# Patient Record
Sex: Male | Born: 1935 | Race: White | Hispanic: No | Marital: Married | Smoking: Former smoker
Health system: Southern US, Community
[De-identification: ages and names within clinical notes are randomized; demographics above are authoritative.]

## PROBLEM LIST (undated history)

## (undated) DIAGNOSIS — G459 Transient cerebral ischemic attack, unspecified: Secondary | ICD-10-CM

## (undated) DIAGNOSIS — M1990 Unspecified osteoarthritis, unspecified site: Secondary | ICD-10-CM

## (undated) DIAGNOSIS — I219 Acute myocardial infarction, unspecified: Secondary | ICD-10-CM

## (undated) DIAGNOSIS — K610 Anal abscess: Secondary | ICD-10-CM

## (undated) DIAGNOSIS — I2510 Atherosclerotic heart disease of native coronary artery without angina pectoris: Secondary | ICD-10-CM

## (undated) DIAGNOSIS — I4891 Unspecified atrial fibrillation: Secondary | ICD-10-CM

## (undated) DIAGNOSIS — Z860100 Personal history of colon polyps, unspecified: Secondary | ICD-10-CM

## (undated) DIAGNOSIS — I10 Essential (primary) hypertension: Secondary | ICD-10-CM

## (undated) DIAGNOSIS — I4510 Unspecified right bundle-branch block: Secondary | ICD-10-CM

## (undated) DIAGNOSIS — E785 Hyperlipidemia, unspecified: Secondary | ICD-10-CM

## (undated) DIAGNOSIS — I4819 Other persistent atrial fibrillation: Secondary | ICD-10-CM

## (undated) HISTORY — DX: Transient cerebral ischemic attack, unspecified: G45.9

## (undated) HISTORY — DX: Unspecified atrial fibrillation: I48.91

## (undated) HISTORY — PX: CORONARY ANGIOPLASTY WITH STENT PLACEMENT: SHX49

## (undated) HISTORY — PX: CATARACT EXTRACTION W/ INTRAOCULAR LENS  IMPLANT, BILATERAL: SHX1307

## (undated) HISTORY — DX: Personal history of colonic polyps: Z86.010

## (undated) HISTORY — DX: Personal history of colon polyps, unspecified: Z86.0100

## (undated) HISTORY — DX: Essential (primary) hypertension: I10

## (undated) HISTORY — PX: CARDIAC CATHETERIZATION: SHX172

## (undated) HISTORY — DX: Unspecified right bundle-branch block: I45.10

## (undated) HISTORY — DX: Unspecified osteoarthritis, unspecified site: M19.90

## (undated) HISTORY — DX: Other persistent atrial fibrillation: I48.19

## (undated) HISTORY — DX: Hyperlipidemia, unspecified: E78.5

## (undated) HISTORY — PX: RETINAL DETACHMENT SURGERY: SHX105

## (undated) HISTORY — DX: Anal abscess: K61.0

## (undated) HISTORY — DX: Atherosclerotic heart disease of native coronary artery without angina pectoris: I25.10

---

## 1998-04-25 ENCOUNTER — Ambulatory Visit (HOSPITAL_COMMUNITY): Admission: RE | Admit: 1998-04-25 | Discharge: 1998-04-25 | Payer: Self-pay | Admitting: Gastroenterology

## 1999-04-05 ENCOUNTER — Encounter: Payer: Self-pay | Admitting: *Deleted

## 1999-04-05 ENCOUNTER — Encounter: Admission: RE | Admit: 1999-04-05 | Discharge: 1999-04-05 | Payer: Self-pay | Admitting: *Deleted

## 1999-08-10 DIAGNOSIS — I219 Acute myocardial infarction, unspecified: Secondary | ICD-10-CM

## 1999-08-10 HISTORY — DX: Acute myocardial infarction, unspecified: I21.9

## 1999-09-03 ENCOUNTER — Encounter: Payer: Self-pay | Admitting: Emergency Medicine

## 1999-09-03 ENCOUNTER — Inpatient Hospital Stay (HOSPITAL_COMMUNITY): Admission: EM | Admit: 1999-09-03 | Discharge: 1999-09-06 | Payer: Self-pay | Admitting: Emergency Medicine

## 1999-09-12 ENCOUNTER — Encounter (HOSPITAL_COMMUNITY): Admission: RE | Admit: 1999-09-12 | Discharge: 1999-12-11 | Payer: Self-pay | Admitting: *Deleted

## 2000-04-30 ENCOUNTER — Ambulatory Visit (HOSPITAL_COMMUNITY): Admission: RE | Admit: 2000-04-30 | Discharge: 2000-04-30 | Payer: Self-pay | Admitting: *Deleted

## 2001-04-25 ENCOUNTER — Encounter (INDEPENDENT_AMBULATORY_CARE_PROVIDER_SITE_OTHER): Payer: Self-pay | Admitting: Specialist

## 2001-04-25 ENCOUNTER — Ambulatory Visit (HOSPITAL_COMMUNITY): Admission: RE | Admit: 2001-04-25 | Discharge: 2001-04-25 | Payer: Self-pay | Admitting: Gastroenterology

## 2003-06-12 HISTORY — PX: INGUINAL HERNIA REPAIR: SUR1180

## 2004-09-28 ENCOUNTER — Ambulatory Visit: Payer: Self-pay | Admitting: Internal Medicine

## 2004-10-04 ENCOUNTER — Ambulatory Visit: Payer: Self-pay | Admitting: *Deleted

## 2004-10-23 ENCOUNTER — Ambulatory Visit: Payer: Self-pay | Admitting: Internal Medicine

## 2004-10-26 ENCOUNTER — Ambulatory Visit: Payer: Self-pay

## 2004-12-01 ENCOUNTER — Ambulatory Visit: Payer: Self-pay | Admitting: Internal Medicine

## 2005-02-23 ENCOUNTER — Ambulatory Visit: Payer: Self-pay | Admitting: Internal Medicine

## 2005-07-13 ENCOUNTER — Ambulatory Visit: Payer: Self-pay | Admitting: Cardiology

## 2005-10-22 ENCOUNTER — Ambulatory Visit: Payer: Self-pay | Admitting: Cardiology

## 2006-05-07 ENCOUNTER — Ambulatory Visit: Payer: Self-pay | Admitting: Cardiology

## 2006-05-07 LAB — CONVERTED CEMR LAB
ALT: 23 units/L (ref 0–40)
AST: 22 units/L (ref 0–37)
Albumin: 3.7 g/dL (ref 3.5–5.2)
Alkaline Phosphatase: 64 units/L (ref 39–117)
BUN: 11 mg/dL (ref 6–23)
Basophils Absolute: 0 10*3/uL (ref 0.0–0.1)
Basophils Relative: 0.8 % (ref 0.0–1.0)
Bilirubin Urine: NEGATIVE
CO2: 28 meq/L (ref 19–32)
Calcium: 9.4 mg/dL (ref 8.4–10.5)
Chloride: 107 meq/L (ref 96–112)
Chol/HDL Ratio, serum: 2.9
Cholesterol: 118 mg/dL (ref 0–200)
Creatinine, Ser: 1 mg/dL (ref 0.4–1.5)
Eosinophil percent: 3.4 % (ref 0.0–5.0)
GFR calc non Af Amer: 79 mL/min
Glomerular Filtration Rate, Af Am: 95 mL/min/{1.73_m2}
Glucose, Bld: 100 mg/dL — ABNORMAL HIGH (ref 70–99)
HCT: 42.6 % (ref 39.0–52.0)
HDL: 40.5 mg/dL (ref >39.0)
Hemoglobin, Urine: NEGATIVE
Hemoglobin: 14.3 g/dL (ref 13.0–17.0)
Hgb A1c MFr Bld: 5.8 % (ref 4.6–6.0)
Ketones, ur: NEGATIVE mg/dL
LDL Cholesterol: 62 mg/dL (ref 0–99)
Leukocytes, UA: NEGATIVE
Lymphocytes Relative: 29.3 % (ref 12.0–46.0)
MCHC: 33.5 g/dL (ref 30.0–36.0)
MCV: 93.6 fL (ref 78.0–100.0)
Monocytes Absolute: 0.5 10*3/uL (ref 0.2–0.7)
Monocytes Relative: 9.4 % (ref 3.0–11.0)
Neutro Abs: 2.7 10*3/uL (ref 1.4–7.7)
Neutrophils Relative %: 57.1 % (ref 43.0–77.0)
Nitrite: NEGATIVE
PSA: 0.78 ng/mL (ref 0.10–4.00)
Platelets: 255 10*3/uL (ref 150–400)
Potassium: 4.4 meq/L (ref 3.5–5.1)
RBC: 4.55 M/uL (ref 4.22–5.81)
RDW: 12.6 % (ref 11.5–14.6)
Sodium: 141 meq/L (ref 135–145)
Specific Gravity, Urine: 1.015 (ref 1.000–1.03)
TSH: 1.18 microintl units/mL (ref 0.35–5.50)
Total Bilirubin: 0.5 mg/dL (ref 0.3–1.2)
Total Protein, Urine: NEGATIVE mg/dL
Total Protein: 6.1 g/dL (ref 6.0–8.3)
Triglyceride fasting, serum: 80 mg/dL (ref 0–149)
Urine Glucose: NEGATIVE mg/dL
Urobilinogen, UA: 0.2 (ref 0.0–1.0)
VLDL: 16 mg/dL (ref 0–40)
WBC: 4.8 10*3/uL (ref 4.5–10.5)
pH: 6 (ref 5.0–8.0)

## 2006-06-28 ENCOUNTER — Ambulatory Visit: Payer: Self-pay | Admitting: Internal Medicine

## 2006-08-09 ENCOUNTER — Ambulatory Visit: Payer: Self-pay | Admitting: Cardiology

## 2007-05-23 ENCOUNTER — Ambulatory Visit: Payer: Self-pay | Admitting: Cardiology

## 2007-05-23 LAB — CONVERTED CEMR LAB
ALT: 33 units/L (ref 0–53)
AST: 21 units/L (ref 0–37)
Albumin: 3.7 g/dL (ref 3.5–5.2)
Alkaline Phosphatase: 59 units/L (ref 39–117)
Bilirubin, Direct: 0.2 mg/dL (ref 0.0–0.3)
Cholesterol: 155 mg/dL (ref 0–200)
HDL: 47.4 mg/dL (ref >39.0)
LDL Cholesterol: 90 mg/dL (ref 0–99)
Total Bilirubin: 0.9 mg/dL (ref 0.3–1.2)
Total CHOL/HDL Ratio: 3.3
Total Protein: 6.5 g/dL (ref 6.0–8.3)
Triglycerides: 87 mg/dL (ref 0–149)
VLDL: 17 mg/dL (ref 0–40)

## 2007-05-27 ENCOUNTER — Encounter: Admission: RE | Admit: 2007-05-27 | Discharge: 2007-05-27 | Payer: Self-pay | Admitting: General Surgery

## 2007-05-27 ENCOUNTER — Ambulatory Visit: Payer: Self-pay | Admitting: Cardiology

## 2007-05-29 ENCOUNTER — Ambulatory Visit (HOSPITAL_BASED_OUTPATIENT_CLINIC_OR_DEPARTMENT_OTHER): Admission: RE | Admit: 2007-05-29 | Discharge: 2007-05-29 | Payer: Self-pay | Admitting: General Surgery

## 2007-07-28 ENCOUNTER — Telehealth (INDEPENDENT_AMBULATORY_CARE_PROVIDER_SITE_OTHER): Payer: Self-pay | Admitting: *Deleted

## 2007-09-01 ENCOUNTER — Telehealth (INDEPENDENT_AMBULATORY_CARE_PROVIDER_SITE_OTHER): Payer: Self-pay | Admitting: *Deleted

## 2007-09-12 ENCOUNTER — Encounter (INDEPENDENT_AMBULATORY_CARE_PROVIDER_SITE_OTHER): Payer: Self-pay | Admitting: *Deleted

## 2007-09-22 ENCOUNTER — Telehealth: Payer: Self-pay | Admitting: Internal Medicine

## 2007-10-20 ENCOUNTER — Ambulatory Visit: Payer: Self-pay | Admitting: Internal Medicine

## 2007-10-20 LAB — CONVERTED CEMR LAB
ALT: 26 units/L (ref 0–53)
AST: 23 units/L (ref 0–37)
Albumin: 4 g/dL (ref 3.5–5.2)
Alkaline Phosphatase: 64 units/L (ref 39–117)
BUN: 25 mg/dL — ABNORMAL HIGH (ref 6–23)
Basophils Absolute: 0.1 10*3/uL (ref 0.0–0.1)
Basophils Relative: 1.7 % — ABNORMAL HIGH (ref 0.0–1.0)
Bilirubin Urine: NEGATIVE
Bilirubin, Direct: 0.1 mg/dL (ref 0.0–0.3)
CO2: 26 meq/L (ref 19–32)
Calcium: 9.3 mg/dL (ref 8.4–10.5)
Chloride: 108 meq/L (ref 96–112)
Cholesterol: 153 mg/dL (ref 0–200)
Creatinine, Ser: 1 mg/dL (ref 0.4–1.5)
Eosinophils Absolute: 0.1 10*3/uL (ref 0.0–0.7)
Eosinophils Relative: 2.2 % (ref 0.0–5.0)
GFR calc Af Amer: 95 mL/min
GFR calc non Af Amer: 79 mL/min
Glucose, Bld: 98 mg/dL (ref 70–99)
HCT: 45.7 % (ref 39.0–52.0)
HDL: 57 mg/dL (ref >39.0)
Hemoglobin, Urine: NEGATIVE
Hemoglobin: 15.1 g/dL (ref 13.0–17.0)
Ketones, ur: NEGATIVE mg/dL
LDL Cholesterol: 87 mg/dL (ref 0–99)
Leukocytes, UA: NEGATIVE
Lymphocytes Relative: 35.4 % (ref 12.0–46.0)
MCHC: 33.2 g/dL (ref 30.0–36.0)
MCV: 92.2 fL (ref 78.0–100.0)
Monocytes Absolute: 0.4 10*3/uL (ref 0.1–1.0)
Monocytes Relative: 10.4 % (ref 3.0–12.0)
Neutro Abs: 2.2 10*3/uL (ref 1.4–7.7)
Neutrophils Relative %: 50.3 % (ref 43.0–77.0)
Nitrite: NEGATIVE
PSA: 0.71 ng/mL (ref 0.10–4.00)
Platelets: 261 10*3/uL (ref 150–400)
Potassium: 4.1 meq/L (ref 3.5–5.1)
RBC: 4.95 M/uL (ref 4.22–5.81)
RDW: 13 % (ref 11.5–14.6)
Sodium: 138 meq/L (ref 135–145)
Specific Gravity, Urine: <=1.005 (ref 1.000–1.03)
TSH: 1.68 microintl units/mL (ref 0.35–5.50)
Testosterone: 358.35 ng/dL (ref 350.00–890)
Total Bilirubin: 0.8 mg/dL (ref 0.3–1.2)
Total CHOL/HDL Ratio: 2.7
Total Protein, Urine: NEGATIVE mg/dL
Total Protein: 6.8 g/dL (ref 6.0–8.3)
Triglycerides: 44 mg/dL (ref 0–149)
Urine Glucose: NEGATIVE mg/dL
Urobilinogen, UA: 0.2 (ref 0.0–1.0)
VLDL: 9 mg/dL (ref 0–40)
WBC: 4.3 10*3/uL — ABNORMAL LOW (ref 4.5–10.5)
pH: 5.5 (ref 5.0–8.0)

## 2007-10-27 ENCOUNTER — Ambulatory Visit: Payer: Self-pay | Admitting: Internal Medicine

## 2007-10-27 DIAGNOSIS — Z860100 Personal history of colon polyps, unspecified: Secondary | ICD-10-CM | POA: Insufficient documentation

## 2007-10-27 DIAGNOSIS — E785 Hyperlipidemia, unspecified: Secondary | ICD-10-CM | POA: Insufficient documentation

## 2007-10-27 DIAGNOSIS — I2510 Atherosclerotic heart disease of native coronary artery without angina pectoris: Secondary | ICD-10-CM | POA: Insufficient documentation

## 2007-10-27 LAB — CONVERTED CEMR LAB
Cholesterol, target level: 200 mg/dL
HDL goal, serum: 40 mg/dL
LDL Goal: 160 mg/dL

## 2007-12-08 ENCOUNTER — Encounter: Payer: Self-pay | Admitting: Internal Medicine

## 2007-12-08 ENCOUNTER — Ambulatory Visit: Payer: Self-pay

## 2007-12-08 ENCOUNTER — Ambulatory Visit: Payer: Self-pay | Admitting: Cardiology

## 2008-05-03 ENCOUNTER — Ambulatory Visit: Payer: Self-pay | Admitting: Cardiology

## 2008-05-03 LAB — CONVERTED CEMR LAB
ALT: 25 units/L (ref 0–53)
AST: 18 units/L (ref 0–37)
Albumin: 3.6 g/dL (ref 3.5–5.2)
Alkaline Phosphatase: 51 units/L (ref 39–117)
Bilirubin, Direct: 0.1 mg/dL (ref 0.0–0.3)
Cholesterol: 146 mg/dL (ref 0–200)
HDL: 60.2 mg/dL (ref >39.0)
LDL Cholesterol: 78 mg/dL (ref 0–99)
Total Bilirubin: 0.7 mg/dL (ref 0.3–1.2)
Total CHOL/HDL Ratio: 2.4
Total Protein: 6.6 g/dL (ref 6.0–8.3)
Triglycerides: 39 mg/dL (ref 0–149)
VLDL: 8 mg/dL (ref 0–40)

## 2008-05-11 ENCOUNTER — Ambulatory Visit: Payer: Self-pay | Admitting: Cardiovascular Disease

## 2008-05-25 ENCOUNTER — Ambulatory Visit: Payer: Self-pay

## 2008-12-02 ENCOUNTER — Telehealth (INDEPENDENT_AMBULATORY_CARE_PROVIDER_SITE_OTHER): Payer: Self-pay | Admitting: *Deleted

## 2009-01-31 ENCOUNTER — Telehealth (INDEPENDENT_AMBULATORY_CARE_PROVIDER_SITE_OTHER): Payer: Self-pay | Admitting: *Deleted

## 2009-09-01 ENCOUNTER — Encounter (INDEPENDENT_AMBULATORY_CARE_PROVIDER_SITE_OTHER): Payer: Self-pay | Admitting: *Deleted

## 2009-09-16 ENCOUNTER — Telehealth: Payer: Self-pay | Admitting: Cardiovascular Disease

## 2009-09-21 ENCOUNTER — Ambulatory Visit: Payer: Self-pay | Admitting: Cardiovascular Disease

## 2009-09-27 ENCOUNTER — Ambulatory Visit: Payer: Self-pay | Admitting: Cardiovascular Disease

## 2009-09-27 LAB — CONVERTED CEMR LAB
ALT: 25 units/L (ref 0–53)
AST: 18 units/L (ref 0–37)
Albumin: 3.8 g/dL (ref 3.5–5.2)
Alkaline Phosphatase: 64 units/L (ref 39–117)
BUN: 14 mg/dL (ref 6–23)
Basophils Absolute: 0 10*3/uL (ref 0.0–0.1)
Basophils Relative: 0.3 % (ref 0.0–3.0)
Bilirubin Urine: NEGATIVE
Bilirubin, Direct: 0.1 mg/dL (ref 0.0–0.3)
CO2: 29 meq/L (ref 19–32)
Calcium: 9.6 mg/dL (ref 8.4–10.5)
Chloride: 103 meq/L (ref 96–112)
Cholesterol: 158 mg/dL (ref 0–200)
Creatinine, Ser: 0.8 mg/dL (ref 0.4–1.5)
Eosinophils Absolute: 0.1 10*3/uL (ref 0.0–0.7)
Eosinophils Relative: 1.1 % (ref 0.0–5.0)
GFR calc non Af Amer: 100.82 mL/min (ref >60)
Glucose, Bld: 103 mg/dL — ABNORMAL HIGH (ref 70–99)
HCT: 42.1 % (ref 39.0–52.0)
HDL: 61.1 mg/dL (ref >39.00)
Hemoglobin, Urine: NEGATIVE
Hemoglobin: 14.5 g/dL (ref 13.0–17.0)
Ketones, ur: NEGATIVE mg/dL
LDL Cholesterol: 84 mg/dL (ref 0–99)
Leukocytes, UA: NEGATIVE
Lymphocytes Relative: 17 % (ref 12.0–46.0)
Lymphs Abs: 1.6 10*3/uL (ref 0.7–4.0)
MCHC: 34.4 g/dL (ref 30.0–36.0)
MCV: 91.3 fL (ref 78.0–100.0)
Monocytes Absolute: 0.7 10*3/uL (ref 0.1–1.0)
Monocytes Relative: 7 % (ref 3.0–12.0)
Neutro Abs: 7 10*3/uL (ref 1.4–7.7)
Neutrophils Relative %: 74.6 % (ref 43.0–77.0)
Nitrite: NEGATIVE
PSA: 1.34 ng/mL (ref 0.10–4.00)
Platelets: 296 10*3/uL (ref 150.0–400.0)
Potassium: 4.6 meq/L (ref 3.5–5.1)
RBC: 4.61 M/uL (ref 4.22–5.81)
RDW: 13.5 % (ref 11.5–14.6)
Sodium: 140 meq/L (ref 135–145)
Specific Gravity, Urine: 1.02 (ref 1.000–1.030)
TSH: 1.58 microintl units/mL (ref 0.35–5.50)
Testosterone: 352.65 ng/dL (ref 350.00–890.00)
Total Bilirubin: 0.4 mg/dL (ref 0.3–1.2)
Total CHOL/HDL Ratio: 3
Total Protein, Urine: NEGATIVE mg/dL
Total Protein: 7.6 g/dL (ref 6.0–8.3)
Triglycerides: 67 mg/dL (ref 0.0–149.0)
Urine Glucose: NEGATIVE mg/dL
Urobilinogen, UA: 0.2 (ref 0.0–1.0)
VLDL: 13.4 mg/dL (ref 0.0–40.0)
WBC: 9.4 10*3/uL (ref 4.5–10.5)
pH: 6 (ref 5.0–8.0)

## 2009-10-17 ENCOUNTER — Ambulatory Visit: Payer: Self-pay | Admitting: Internal Medicine

## 2009-10-26 ENCOUNTER — Telehealth (INDEPENDENT_AMBULATORY_CARE_PROVIDER_SITE_OTHER): Payer: Self-pay | Admitting: *Deleted

## 2009-12-28 ENCOUNTER — Ambulatory Visit: Payer: Self-pay | Admitting: Family Medicine

## 2010-06-06 ENCOUNTER — Encounter: Payer: Self-pay | Admitting: Cardiovascular Disease

## 2010-06-07 ENCOUNTER — Ambulatory Visit: Payer: Self-pay

## 2010-06-07 ENCOUNTER — Encounter: Payer: Self-pay | Admitting: Cardiovascular Disease

## 2010-07-11 NOTE — Letter (Signed)
Summary: Primary Care Appointment Letter  [facility]  [address] [place], [place] [address]   Phone: [phone]  Fax: [phone]    09/01/2009 MRN: [id number]  Luis [name] [address] [place], [place]  [address]  Dear Mr. BURKITT,   Your Primary Care Physician Marga Melnick MD has indicated that:    ___x____it is time to schedule an appointment LAST OFFICE VISIT WAS 10/27/07 AND LAST REFILL WAS 01/31/09 OFFICE VISIT AND LABS ARE NEEDED TO CONTINUE FILLING MEDICATION.    _______you missed your appointment on______ and need to call and          reschedule.    _______you need to have lab work done.    _______you need to schedule an appointment discuss lab or test results.    _______you need to call to reschedule your appointment that is                       scheduled on _________.     Please call our office as soon as possible. Our phone number is [phone]_____. Our office is open 8a-5p, Monday through Friday.     Thank you,    [facility]

## 2010-07-11 NOTE — Progress Notes (Signed)
Summary: Pt calling about labs drawn   Phone Note Call from Patient Call back at Home Phone [phone] Call back at [phone]    Caller: Patient Summary of Call: Pt want a call about having labs drawn Initial call taken by: Judie Grieve,  September 16, 2009 11:47 AM  Follow-up for Phone Call        Pt. has office visit with Dr. Clifton James on 09/27/09 and appt with Dr. Alwyn Ren on 5/9. He would like lab work drawn prior to visit with Dr. Clifton James so it is available by time of office visit. Lipid and liver usually  checked in our office. These labs ordered. Labs order at [facility] office for next week per pt's request.  He will contact Dr. Alwyn Ren regarding other lab work to be drawn at that time. Follow-up by: Dossie Arbour, RN, BSN,  September 16, 2009 3:20 PM

## 2010-07-11 NOTE — Progress Notes (Signed)
  Request recieved from [facility] sent to [facility] [place]  Oct 26, 2009 10:12 AM

## 2010-07-11 NOTE — Assessment & Plan Note (Signed)
Summary: f2y   Visit Type:  2 yr f/u Primary Provider:  Marga Melnick MD  CC:  no cardiac complaints today.  History of Present Illness: Luis [name] is a pleasant 76 year old white male with a past medical history significant for coronary artery disease status post placement of a bare-metal stent in the right coronary artery in 2001 with a staged intervention to the circumflex coronary artery with placement of a bare-metal stent in the mid circumflex vessel and balloon angioplasty of the ostium of the first obtuse marginal coronary artery.  He has a residual 70% stenosis in the distal right coronary artery as well as nonobstructive disease in the other vessels that has been managed medically over the last several years. He comes in today for follow up.  He tells me that he continues to work full-time and exercises several days per week.  He has experienced no exertional chest pain or dyspnea with exertion.  He denies palpitations, any awareness of irregularity of his heart rhythm, orthopnea, PND, or lower extremity edema.  He has some tingling in his feet which has been stable.   Current Medications (verified): 1)  Zetia 10 Mg Tabs (Ezetimibe) .... Take 1 Tablet By Mouth Every Night 2)  Lipitor 80 Mg Tabs (Atorvastatin Calcium) .... Take 1 Tablet By Mouth Once A Day 3)  Atenolol 25 Mg Tabs (Atenolol) .Marland Kitchen.. 1 Tab Once Daily 4)  Nitrostat 0.4 Mg Subl (Nitroglycerin) .Marland Kitchen.. 1 Tablet Under Tongue At Onset of Chest Pain; You May Repeat Every 5 Minutes For Up To 3 Doses. 5)  Folic Acid 1 Mg  Tabs (Folic Acid) .Marland Kitchen.. 1 Qd 6)  Aspirin 81 Mg Tbec (Aspirin) .... Take One Tablet By Mouth Daily  Allergies (verified): No Known Drug Allergies  Past History:  Past Medical History: Hyperlipidemia Ttinnitis Neuropathy , stable Coronary artery disease-s/p bare metal stent RCA and bare metal stent Circumflex 2001, PTCA OM1 2001. Retinal detachment HTN         Past Surgical History: CAD two stents  08/1999 cath 2001: stents  patent 05/2000 colonoscopy 2002,2005, Dr Kinnie Scales cataract surgery bilat 2007 laser surgery after lens implant OS Inguinal hernia repair 12/08, Dr Zachery Dakins Retinal surgery left eye  Family History: Reviewed history from 05/11/2008 and no changes required. Mother-deceased, ? cause Sister-deceased  cva father colon cancer, prostate cancer No CAD No SCD  Social History: Reviewed history from 05/11/2008 and no changes required. 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    Vital Signs:  Patient profile:   76 year old male Height:      72 inches Weight:      208 pounds BMI:     28.31 Pulse rate:   79 / minute Pulse rhythm:   regular BP sitting:   116 / 70  (left arm) Cuff size:   regular  Vitals Entered By: Danielle Rankin, CMA (September 27, 2009 3:35 PM)  Physical Exam  General:  General: Well developed, well  nourished, NAD HEENT: OP clear, mucus membranes moist SKIN: warm, dry Neuro: No focal deficits Musculoskeletal: Muscle strength 5/5 all ext Psychiatric: Mood and affect normal Neck: No JVD, no carotid bruits, no thyromegaly, no lymphadenopathy. Lungs:Clear bilaterally, no wheezes, rhonci, crackles CV: RRR no murmurs, gallops rubs Abdomen: soft, NT, ND, BS present Extremities: No edema, pulses  2+.    EKG  Procedure date:  09/27/2009  Findings:      NSR, rate 79 bpm. Normal EKG   Impression & Recommendations:  Problem # 1:  CORONARY ARTERY DISEASE (ICD-414.00) Stable. Continue beta blocker, ASA and statin.   The following medications were removed from the medication list:    Altace 5 Mg Caps (Ramipril) .Marland Kitchen... Take 1 capsule by mouth once a day His updated medication list for this problem includes:    Atenolol 25 Mg Tabs (Atenolol) .Marland Kitchen... 1 tab once daily    Nitrostat 0.4 Mg Subl (Nitroglycerin) .Marland Kitchen... 1 tablet under tongue at onset of chest pain; you may repeat every 5 minutes for up to 3 doses.    Aspirin 81 Mg Tbec (Aspirin) .Marland Kitchen... Take one tablet by mouth daily  Orders: EKG w/ Interpretation (93000)  Problem # 2:  UNSPECIFIED ESSENTIAL HYPERTENSION (ICD-401.9) BP is well controlled.   The following medications were removed from the medication list:    Altace 5 Mg Caps (Ramipril) .Marland Kitchen... Take 1 capsule by mouth once a day His updated medication list for this problem includes:    Atenolol 25 Mg Tabs (Atenolol) .Marland Kitchen... 1 tab once daily    Aspirin 81 Mg Tbec (Aspirin) .Marland Kitchen... Take one tablet by mouth daily  Problem # 3:  HYPERLIPIDEMIA (ICD-272.4) LDL of 84, HDL 66, TC 154. Continue Zetia and Lipitor.   His updated medication list for this problem includes:    Zetia 10 Mg Tabs (Ezetimibe) .Marland Kitchen... Take 1 tablet by mouth every night    Lipitor 80 Mg Tabs (Atorvastatin calcium) .Marland Kitchen... Take 1 tablet by mouth once a day  Patient Instructions: 1)  Your physician recommends that you schedule a follow-up appointment in: 1 year 2)  Your physician has recommended you make the following change in your medication: Start enteric coated aspirin 81 mg by mouth daily Prescriptions: ATENOLOL 25 MG TABS (ATENOLOL) 1 tab once daily  #30 x 11   Entered by:   Danielle Rankin, CMA   Authorized by:   Verne Carrow, MD   Signed by:   Danielle Rankin, CMA on 09/27/2009   Method used:    Electronically to        [facility]* (retail)       [address]       [place], [place]  [id number]       Ph: [phone]       Fax: [phone]   RxID:   [id number]

## 2010-07-11 NOTE — Assessment & Plan Note (Signed)
Summary: cpx//lch   Vital Signs:  Patient profile:   75 year old male Height:      71 inches Weight:      210 pounds Temp:     98.8 degrees F oral Pulse rate:   78 / minute Resp:     22 per minute BP sitting:   138 / 82  (left arm)  Vitals Entered By: Jeremy Johann CMA (Oct 17, 2009 1:19 PM)  Comments Normal EKG done 09-27-09 REVIEWED MED LIST, PATIENT AGREED DOSE AND INSTRUCTION CORRECT    Primary Care Provider:  Marga Melnick MD   History of Present Illness: Luis [name] is here for a physical; he has had a dramatic loss in OS vision due to retinal detachment X 2 which required 3 surgeries. OS vision  is now 20/2800. Preventive healthcare issues reviewed  ; all up to date except Tetanus shot ( given today). Dr [facility]  09/27/2009 note reviewed.  Allergies (verified): No Known Drug Allergies  Past History:  Past Medical History: Hyperlipidemia Tinnitis Neuropathy , stable Coronary artery disease-S/P  bare metal stent RCA and bare metal stent Circumflex 2001, PTCA OM1 2001. Retinal detachment X 2  ,surgery X 3 over period 05/2008 - 09/2008  HTN         Past Surgical History: CAD, two stents 08/1999 cath 2001: stents  patent 05/2000 colonoscopy 2002,2005, Dr Ritta Slot, due now cataract surgery bilaterally  2007 laser surgery after lens implant OS Inguinal hernia repair 05/2007, Dr Zachery Dakins Retinal surgery X 3  OS  Family History: Mother-deceased, ? 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         Stable hearing loss. Eyes:  Complains of vision loss-1 eye; denies double vision. GU:  Denies discharge, dysuria, hematuria, nocturia, and urinary hesitancy. Neuro:  Complains of numbness and tingling; denies brief paralysis, headaches, tremors, and weakness; N&T in feet stable.  Physical Exam  General:  Appears younger than agewell-nourished; alert,appropriate and cooperative throughout examination Head:  Normocephalic and atraumatic without obvious abnormalities. No apparent alopecia ; beard & moustache Eyes:  Distorted , asymmetric OS pupil. OD exam  normal Ears:  External ear exam shows no significant lesions or deformities.  Otoscopic examination reveals clear canals, tympanic membranes are intact bilaterally without bulging, retraction, inflammation or discharge. Hearing is grossly normal bilaterally. Nose:  External nasal examination shows no deformity or inflammation. Nasal mucosa are pink and moist without lesions or exudates. Mouth:  Oral mucosa and oropharynx without lesions or exudates.  Teeth in good repair, upper & lower partial. Neck:  No deformities, masses, or tenderness noted. Lungs:  Normal respiratory effort, chest expands symmetrically. Lungs are clear to auscultation, no crackles or wheezes. Heart:  normal rate, no gallop, no rub, no JVD, no HJR, and grade 1 /6 systolic murmur @ base.  S4 Abdomen:  Bowel sounds positive,abdomen soft and non-tender without masses, organomegaly or hernias noted. Rectal:  No external abnormalities noted. Normal sphincter tone. No rectal masses or tenderness.  Genitalia:  Testes bilaterally descended without nodularity, tenderness or masses. No scrotal masses or lesions. No penis lesions or urethral discharge. Vasectomy scar tissue present. Prostate:  Prostate gland firm and  smooth, no enlargement, nodularity, tenderness, mass, asymmetry or induration. Msk:  No deformity or scoliosis noted of thoracic or lumbar spine.   Pulses:  R and L carotid,radial,dorsalis pedis and posterior tibial pulses are full and equal bilaterally Extremities:  No clubbing, cyanosis, edema, or deformity noted with normal full range of motion of all joints.  Crepitus of knees  Neurologic:  alert & oriented X3 and DTRs symmetrical and normal.   Skin:  Intact without suspicious lesions or rashes Cervical Nodes:  No lymphadenopathy noted Inguinal Nodes:  No significant adenopathy Psych:  memory intact for recent and remote, normally interactive, and good eye contact.     Impression & Recommendations:  Problem # 1:  PREVENTIVE HEALTH CARE (ICD-V70.0)  Problem # 2:  UNSPECIFIED ESSENTIAL HYPERTENSION (ICD-401.9)  His updated medication list for this problem includes:    Atenolol 25 Mg Tabs (Atenolol) .Marland Kitchen... 1 tab once daily  Problem # 3:  CORONARY ARTERY DISEASE (ICD-414.00) as per Dr Clifton James His updated medication list for this problem includes:    Atenolol 25 Mg Tabs (Atenolol) .Marland Kitchen... 1 tab once daily    Nitrostat 0.4 Mg Subl (Nitroglycerin) .Marland Kitchen... 1 tablet under tongue at onset of chest pain; you may repeat every 5 minutes for up to 3 doses.    Aspirin 81 Mg Tbec (Aspirin) .Marland Kitchen... Take one tablet by mouth daily  Problem # 4:  HYPERLIPIDEMIA (ICD-272.4)  Lipids @ goal  His updated medication list for this problem includes:    Zetia 10 Mg Tabs (Ezetimibe) .Marland Kitchen... Take 1 tablet by mouth every night    Lipitor 80 Mg Tabs (Atorvastatin calcium) .Marland Kitchen... Take 1 tablet by mouth once a day  Problem # 5:  COLONIC POLYPS, HX OF (ICD-V12.72) as per Dr Kinnie Scales  Complete Medication List: 1)  Zetia 10 Mg Tabs (Ezetimibe) .... Take 1 tablet by mouth every night 2)  Lipitor 80 Mg Tabs (Atorvastatin calcium) .... Take 1 tablet by mouth once a day 3)  Atenolol 25 Mg Tabs (Atenolol) .Marland Kitchen.. 1 tab once  daily 4)  Nitrostat 0.4 Mg Subl (Nitroglycerin) .Marland Kitchen.. 1 tablet under tongue at onset of chest pain; you may repeat every 5 minutes for up to 3 doses. 5)  Folic Acid 1 Mg Tabs (Folic acid) .Marland Kitchen.. 1 qd 6)  Aspirin 81 Mg Tbec (Aspirin) .... Take one tablet by mouth daily  Other Orders: Tdap => 32yrs IM [phone]) Admin 1st Vaccine (84132) Admin 1st Vaccine [facility]) [phone])  Patient Instructions: 1)  Check your Blood Pressure regularly. If it is above: 135/85 ON AVERAGE  you should make an appointment.   Tetanus/Td Vaccine    Vaccine Type: Tdap    Site: left deltoid    Mfr: GlaxoSmithKline    Dose: 0.5 ml    Route: IM    Given by: Jeremy Johann CMA    Exp. Date: 09/03/2011    Lot #: [id number]    VIS given: 04/29/07 version given Oct 17, 2009.

## 2010-07-11 NOTE — Assessment & Plan Note (Signed)
Summary: cough-congested/cbs   Vital Signs:  Patient profile:   76 year old male Weight:      203 pounds O2 Sat:      94 % on Room air Temp:     97.3 degrees F oral Pulse rate:   88 / minute Pulse rhythm:   regular BP sitting:   120 / 80  (left arm) Cuff size:   regular  Vitals Entered By: Army Fossa CMA (December 28, 2009 1:17 PM)  O2 Flow:  Room air CC: Chest cold. Comments Drainaige- yello Sore throat, cough   History of Present Illness: 76 yo man here today for cough and chest congestion.  sxs started last week.  no known sick contacts.  no fevers.  'my throat's just raw from coughing'.  + nasal congestion.  no ear pain.  no facial pain/pressure.  + HA.  cough is nonproductive.  went to [facility] last week and was told to take Mucinex and Tylenol.  Current Medications (verified): 1)  Zetia 10 Mg Tabs (Ezetimibe) .... Take 1 Tablet By Mouth Every Night 2)  Lipitor 80 Mg Tabs (Atorvastatin Calcium) .... Take 1 Tablet By Mouth Once A Day 3)  Atenolol 25 Mg Tabs (Atenolol) .Marland Kitchen.. 1 Tab Once Daily 4)  Nitrostat 0.4 Mg Subl (Nitroglycerin) .Marland Kitchen.. 1 Tablet Under Tongue At Onset of Chest Pain; You May Repeat Every 5 Minutes For Up To 3 Doses. 5)  Folic Acid 1 Mg  Tabs (Folic Acid) .Marland Kitchen.. 1 Qd 6)  Aspirin 81 Mg Tbec (Aspirin) .... 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  no TTP over sinuses Eyes:  no injxn or inflammation Ears:  External ear exam shows no significant lesions or deformities.   Otoscopic examination reveals clear canals, tympanic membranes are intact bilaterally without bulging, retraction, inflammation or discharge. Hearing is grossly normal bilaterally. Nose:  + congestion and turbinate edema Mouth:  + PND, post pharyngeal erythema Neck:  No deformities, masses, or tenderness noted. Lungs:  Normal respiratory effort, chest expands symmetrically. Lungs are clear to auscultation, no crackles or wheezes. Heart:  normal rate, reg S1/S2, +  S4 Cervical Nodes:  L ant LAD   Impression & Recommendations:  Problem # 1:  BRONCHITIS- ACUTE (ICD-466.0) Assessment New given duration of the illness will start abx.  reviewed supportive care and red flags that should prompt return.  Pt expresses understanding and is in agreement w/ this plan. His updated medication list for this problem includes:    Azithromycin 250 Mg Tabs (Azithromycin) .Marland Kitchen... 2 by  mouth today and then 1 daily for 4 days    Cheratussin Ac 100-10 Mg/40ml Syrp (Guaifenesin-codeine) .Marland Kitchen... 1-2 tsps q6 as needed for cough.  disp 150 ml  Complete Medication List: 1)  Zetia 10 Mg Tabs (Ezetimibe) .... Take 1 tablet by mouth every night 2)  Lipitor 80 Mg Tabs (Atorvastatin calcium) .... Take 1 tablet by mouth once a day 3)  Atenolol 25 Mg Tabs (Atenolol) .Marland Kitchen.. 1 tab once daily 4)  Nitrostat 0.4 Mg Subl (Nitroglycerin) .Marland Kitchen.. 1 tablet  under tongue at onset of chest pain; you may repeat every 5 minutes for up to 3 doses. 5)  Folic Acid 1 Mg Tabs (Folic acid) .Marland Kitchen.. 1 qd 6)  Aspirin 81 Mg Tbec (Aspirin) .... Take one tablet by mouth daily 7)  Azithromycin 250 Mg Tabs (Azithromycin) .... 2 by  mouth today and then 1 daily for 4 days 8)  Cheratussin Ac 100-10 Mg/69ml Syrp (Guaifenesin-codeine) .Marland Kitchen.. 1-2 tsps q6 as needed for cough.  disp 150 ml  Patient Instructions: 1)  Follow up as needed 2)  Take the Azithromycin as directed for bronchitis 3)  Use the codeine cough syrup as needed for severe cough.  May cause drowsiness  so no driving 4)  Tylenol/Ibuprofen for sore throat 5)  Robitussin or Delsym for day cough 6)  Drink plenty of fluids 7)  Call if no improvement or worsening 8)  Hang in there! Prescriptions: CHERATUSSIN AC 100-10 MG/5ML SYRP (GUAIFENESIN-CODEINE) 1-2 tsps Q6 as needed for cough.  disp 150 ml  #150 x 0   Entered and Authorized by:   Neena Rhymes MD   Signed by:   Neena Rhymes MD on 12/28/2009   Method used:   Print then Give to Patient   RxID:   [id number] AZITHROMYCIN 250 MG  TABS (AZITHROMYCIN) 2 by  mouth today and then 1 daily for 4 days  #6 x 0   Entered and Authorized by:   Neena Rhymes MD   Signed by:   Neena Rhymes MD on 12/28/2009   Method used:   Electronically to        [facility]* (retail)       [address]       [place], [place]  [id number]       Ph: [phone]       Fax: [phone]   RxID:   [id number]

## 2010-07-13 NOTE — Miscellaneous (Signed)
Summary: Orders Update  Clinical Lists Changes  Problems: Added new problem of CAROTID ARTERY DISEASE (ICD-433.10) Orders: Added new Test order of Carotid Duplex (Carotid Duplex) - Signed 

## 2010-10-20 ENCOUNTER — Encounter: Payer: Self-pay | Admitting: Internal Medicine

## 2010-10-20 ENCOUNTER — Ambulatory Visit (INDEPENDENT_AMBULATORY_CARE_PROVIDER_SITE_OTHER): Payer: Medicare Other | Admitting: Internal Medicine

## 2010-10-20 VITALS — BP 130/80 | HR 64 | Temp 97.9°F | Wt 207.2 lb

## 2010-10-20 DIAGNOSIS — J40 Bronchitis, not specified as acute or chronic: Secondary | ICD-10-CM

## 2010-10-20 MED ORDER — AZITHROMYCIN 250 MG PO TABS: 250.0000 mg | ORAL_TABLET | Freq: Every day | ORAL | Status: AC

## 2010-10-20 MED ORDER — BENZONATATE 200 MG PO CAPS: 200.0000 mg | ORAL_CAPSULE | Freq: Three times a day (TID) | ORAL | Status: AC | PRN

## 2010-10-20 NOTE — Progress Notes (Signed)
  Subjective:    Patient ID: Luis [name], male    DOB: 05/14/1935, 76 y.o.   MRN: [id number]  HPI Respiratory tract infection Onset/symptoms:  05/08 as chest congestion , NP cough Exposures (illness/environmental/extrinsic): sick grandchild Progression of symptoms:stable Treatments/response:Mucinex Present symptoms:hoarseness ,"  Rattle @ night" , cough Fever/chills/sweats:no Frontal headache:no Facial pain:no Nasal purulence:no Sore throat:yes Dental pain:no Lymphadenopathy:no Wheezing/shortness of breath:wheezing Cough/sputum/hemoptysis:minimal sputum only Pleuritic pain:no Associated extrinsic/allergic symptoms:itchy eyes/ sneezing:no Past medical history: Seasonal allergies/asthma:no Smoking history:quit 2001           Review of Systems     Objective:   Physical Exam General appearance is of good health and nourishment; no acute distress or increased work of breathing is present.  No  lymphadenopathy about the head, neck, or axilla noted.   Eyes: No conjunctival inflammation or lid edema is present. There is no scleral icterus. Asymmetric OS pupil  Ears:  External ear exam shows no significant lesions or deformities.  Otoscopic examination reveals clear canals, tympanic membranes are intact bilaterally without bulging, retraction, inflammation or discharge.  Nose:  External nasal examination shows no deformity or inflammation. Nasal mucosa are pink and moist without lesions or exudates. No septal dislocation or dislocation.No obstruction to airflow.   Oral exam: Dental hygiene is good; lips and gums are healthy appearing.There is no oropharyngeal erythema or exudate noted.   Neck:  No deformities, thyromegaly, masses, or tenderness noted.   Heart:  Normal rate and regular rhythm. S1 and S2 normal without gallop, murmur, click, rub or other extra sounds.  Distant heart sounds  Lungs:Chest clear to auscultation; no wheezes, rhonchi,rales ,or rubs present.No  increased work of breathing.    Extremities:  No cyanosis, edema, or clubbing  noted    Skin: Warm & dry w/o jaundice or tenting.          Assessment & Plan:  #1 bronchitis; no criteria to suggest associated rhinosinusitis.  Plan: Zithromax ; Tessalon as needed.

## 2010-10-20 NOTE — Patient Instructions (Signed)
Zithromax and Tessalon as prescribed. Drink as much nondairy fluids as  You  can over the next few days.

## 2010-10-24 NOTE — Assessment & Plan Note (Signed)
[facility]                            CARDIOLOGY OFFICE NOTE   NAME:Luis [name], Luis [name]                       MRN:          [id number]  DATE:05/11/2008                            DOB:          June 02, 1935    PRIMARY CARE PHYSICIAN:  Titus Dubin. Alwyn Ren, MD, FACP, FCCP.   REASON FOR VISIT:  Routine cardiac followup.   HISTORY OF PRESENT ILLNESS:  [name] is a pleasant 76 year old  white male with a past medical history significant for coronary artery  disease status post placement of a bare-metal stent in the right  coronary artery in 2001 with a staged intervention to the circumflex  coronary artery with placement of a bare-metal stent in the mid  circumflex vessel and balloon angioplasty of the ostium of the first  obtuse marginal coronary artery.  He has a residual 70% stenosis in the  distal right coronary artery as well as nonobstructive disease in the  other vessels that has been managed medically over the last several  years.  He was initially followed by Dr. Geralynn Rile in this office and  then his care was assumed by Dr. Nona Dell last year.  Dr.  Diona Browner is no longer seeing patients in this office and because of that  [name] comes in today to establish care with me.  He tells me that  he continues to work full-time and exercises several days per week.  He  has experienced no exertional chest pain or dyspnea with exertion.  He  denies palpitations, any awareness of irregularity of his heart rhythm,  orthopnea, PND, or lower extremity edema.  His only complaint today is  that of occasional dizziness when he rises from a seated position.  He  also feels dizzy occasionally when he quickly turns his head.  He has  been taking all of his medications as prescribed.   PAST MEDICAL HISTORY:  Coronary artery disease as described in detail  above.   PAST SURGICAL HISTORY:  Hernia repair in 2008.   ALLERGIES:  No known drug  allergies.   CURRENT MEDICATIONS:  1. Altace 5 mg once daily.  2. Palmetto Plus 180 mg once daily.  3. Glucosamine sulfate 1 g once daily.  4. Atenolol 25 mg 1/2 tablet once daily.  5. Folic acid 0.4 mg once daily.  6. Zetia 10 mg once daily.  7. Lipitor 40 mg once daily.  8. Enteric-coated aspirin 325 mg once daily.  9. Fish oil once daily.  10.Vitamin E once daily.  11.Black Cherry concentrate once daily.  12.Sublingual nitroglycerin p.r.n. (has not used).   SOCIAL HISTORY:  The patient smoked one-half pack of cigarettes per day  for 45 years, but has not smoked since 2001.  He has a history of  alcohol abuse but has not used alcohol in many years.  He denies the use  of illicit drugs.  He has been married for 35 years and has 2 healthy  children and 1 grandchild.  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   FAMILY HISTORY:  No history of coronary artery disease or sudden cardiac  death.   REVIEW OF SYSTEMS:  As stated in the history of present illness is  otherwise negative.   PHYSICAL EXAMINATION:  VITAL SIGNS:  Blood pressure 118/80, pulse 56 and  regular, respirations 12 and unlabored.  GENERAL:  He is a pleasant, middle-aged Caucasian male in no acute  distress.  He is alert and oriented x3.  PSYCHIATRIC:  Mood and affect are normal.  MUSCULOSKELETAL:  Muscle, strength, and tone is normal.  NEUROLOGIC:  No focal neurological deficits.  SKIN:  Warm and dry.  HEENT:  Normal.  NECK:  No JVD.  No carotid bruits.  No thyromegaly.  No lymphadenopathy.  LUNGS:  Clear to auscultation bilaterally without wheezes, rhonchi, or  crackles noted.  CARDIOVASCULAR:  Bradycardic with regular rhythm.  No murmurs, gallops,  or rubs are noted.  ABDOMEN:  Soft, nontender, and nondistended.  Bowel sounds are present.  EXTREMITIES:  No evidence of edema.  Pulses are 2+ in all extremities.   DIAGNOSTIC STUDIES:  1. A 12-lead EKG obtained in our  office today shows sinus bradycardia      with a ventricular rate of 56 beats per minute.  There is first-      degree AV block noted with a PR interval of 218 milliseconds.      There are no ischemic changes noted.  2. Nuclear stress test performed on December 08, 2007, shows that the      patient exercised for 7 minutes and 30 seconds and stopped      secondary to fatigue.  He denied any chest pain with this study.      There were nonspecific ST changes that were nondiagnostic.      Perfusion images showed inferior wall scar with small amount of      peri-infarct ischemia which was felt to be low risk.  There was      normal contractility and thickening in all areas of the myocardium      with normal left ventricular systolic function and an ejection      fraction of 65%.  3. Recent laboratory values from May 03, 2008, showed a total      cholesterol of 146, triglycerides 39, HDL cholesterol of 60, LDL      cholesterol of 78, AST 18, ALT 25, alkaline phosphatase 51, total      protein 6.6, albumin 3.6.   ASSESSMENT AND PLAN:  [name] is a pleasant 76 year old Caucasian  male with a past medical history significant for remote tobacco abuse  and known coronary artery disease status post percutaneous coronary  intervention with stent placements in 2001 in the right coronary artery  and the circumflex coronary artery and residual 70% stenosis in the  distal right coronary artery who comes in today for a routine cardiac  followup.  He has no signs or symptoms that are suggestive of angina,  arrhythmias, or congestive heart failure.  Because he does have  complaints of occasional dizziness, I will go ahead and order carotid  Doppler studies to be performed.  His heart rate is 56, which could  explain some of his dizziness.  I have reviewed his record and it seems  that his heart rate has mostly been in the low 60s over the last few  years.  If this continues to be a problem, we could  consider stopping  his beta-blocker therapy as this is probably contributing to his  bradycardia.  I have told the patient that we will call him with the  results of his carotid Doppler studies.  Of note, we did perform  orthostatic vital signs here in the office today, which did not show any  changes in his blood pressure or heart rate based on position.  I would  like to see him back in this office in 6 months.  I have encouraged him  to continue to follow with Dr. Alwyn Ren for his primary care needs.  I  reviewed his most recent cholesterol profile with him today which is  favorable.  This does not indicate any needed change in his  antihyperlipidemia therapy.     Verne Carrow, MD  Electronically Signed    CM/MedQ  DD: 05/11/2008  DT: 05/12/2008  Job #: [id number]   cc:   Titus Dubin. Alwyn Ren, MD,FACP,FCCP

## 2010-10-24 NOTE — Op Note (Signed)
NAME:  CLIFTON, SAFLEY NO.:  [id number]   MEDICAL RECORD NO.:  [id number]          PATIENT TYPE:  AMB   LOCATION:  [facility]                         FACILITY:  [facility]   PHYSICIAN:  Anselm Pancoast. Weatherly, M.D.DATE OF BIRTH:  08-23-1934   DATE OF PROCEDURE:  05/29/2007  DATE OF DISCHARGE:                               OPERATIVE REPORT   PREOPERATIVE DIAGNOSIS:  Left inguinal hernia, probably direct.   POSTOPERATIVE DIAGNOSIS:  Left inguinal hernia, direct.   OPERATION:  Left inguinal herniorrhaphy with mesh reinforcement.   ANESTHESIA:  General.   HISTORY:  Janice Seales is a 76 year old male who has got a symptomatic  left inguinal hernia and is here for repair at this time.  Lona Kettle  is his regular physician.  He has had a cardiac clearance since he had  an MI several years ago, and he has stents.  He is continued on his  regular medication and preoperatively given a gram of Ancef and taken  back to the operative suite.  He requested general anesthesia and we  went ahead in the __________ tube and then the left groin was clipped  and then prepped with Betadine solution and draped in a sterile manner.  He had a gram of Ancef preoperatively and first, the ilioinguinal nerve  area was anesthetized with approximately 10 mL of 0.5% Marcaine with a  blunted 22 gauge needle, and then the inguinal ligament incision area  was infiltrated with the same with smaller needle.  Sharp dissection  entered into the skin and subcutaneous tissue.  Two superficial veins  were identified, clamped, ligated with  fine Vicryl, and then the  external oblique aponeurosis was opened through the external ring.  There was a little ilioinguinal nerve that was identified and protected,  and then the cord structures were elevated with a Penrose drain.  This  appears to be a direct inguinal hernia and the hernia sac, which it  pouched out through the floor, was separated from the cord  structures,  brought back and identified the epigastric vessel.  I first used 2-0  Prolene to kind of reinforce the floor in kind of a modified Shouldice  repair, tying the 2 ends back together and recreating the internal ring.  Then I used a piece of mesh shaped like a sail split laterally to  reinforce the floor.  This was sutured to the inguinal ligament with a  running 2-0 Prolene, and the 2 tails together, sutured together to  reinforce the inguinal internal ring.  The angle of the flap revised and  it is not excessively tight.  The superior flap was sutured down with 2-  0 Prolene over the conjoined tendon and rectus area, and it is lying  flat, looks comfortable.  The ilioinguinal nerve went with the cord  structures, and then the external oblique was closed with 2-0 Vicryl.  Scarpa's fascia was closed with interrupted 3-0 Vicryl, 4-0 Vicryl  subcuticular.  Benzoin and Steri-Strips on the skin.  The patient  tolerated the procedure nicely and awake promptly.  He will be released  after a short  stay and I will see him back in the office in  approximately 2 weeks.           ______________________________  Anselm Pancoast. Zachery Dakins, M.D.     WJW/MEDQ  D:  05/29/2007  T:  05/30/2007  Job:  [id number]   cc:   Titus Dubin. Alwyn Ren, MD,FACP,FCCP  [phone] [place]  [place] [address]

## 2010-10-24 NOTE — Assessment & Plan Note (Signed)
[facility]                            CARDIOLOGY OFFICE NOTE   NAME:Luis [name], Luis [name]                       MRN:          [id number]  DATE:05/27/2007                            DOB:          09/15/1934    PRIMARY CARE PHYSICIAN:  Dr. Marga Melnick.   REASON FOR VISIT:  Preoperative evaluation.   HISTORY OF PRESENT ILLNESS:  Luis [name] is a pleasant 76 year old  gentleman followed previously by Dr. Samule Ohm.  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  He has a residual distal 70%  right coronary artery stenosis that has been managed medically and he  reports being very stable since his last visit with Dr. Samule Ohm denying  any exertional angina or breathlessness.  He walks for approximately an  hour at a time 4 or 5 times a week without any symptoms.  He continues  to work, owns a [employer], operational.  His electrocardiogram today  shows sinus rhythm with a prolonged PR interval of 212 milliseconds  which is stable.  No other significant changes are noted in comparison  to the previous tracing.  His last ischemic evaluation was approximately  2 years ago and did not demonstrate any high risk findings.  Today I  reviewed his medications and we talked about followup lipids which show  an increase in his LDL now to 90, although with reasonably good HDL of  47 and normal triglycerides.  His AST and ALT were normal.  We talked  about continuing to be active and work on weight loss.  He did not want  to consider increasing his statin therapy at this time.   ALLERGIES:  NO KNOWN DRUG ALLERGIES.   PRESENT MEDICATIONS:  1. Enteric coated aspirin 81 mg p.o. daily.  2. Lipitor 40 mg p.o. daily.  3. Zetia 10 mg p.o. daily.  4. Folic acid  daily.  5. Atenolol 12.5 mg daily.  6. Glucosamine 500 mg two tablets daily.  7. Altace 5 mg p.o. daily.  8. Sublingual nitroglycerin p.r.n. (has not used).   REVIEW OF SYSTEMS:  As described in the history of present illness.  Otherwise negative.   EXAMINATION:  Blood pressure 121/81, heart rate 61, weight is 213 pounds  up from 208 back in February 2007.  Patient is comfortable, well-  developed, no acute distress, appearing somewhat younger than stated  age.  HEENT:  Conjunctivae and lids normal, oropharynx is clear.  NECK:  Supple, no elevated jugular venous pressure, no loud bruits, no  thyromegaly.  LUNGS:  Clear without labored breathing.  CARDIAC:  Reveals a regular rate and rhythm, no S3 gallop or pericardial  rub.  ABDOMEN:  Soft, nontender, no bruit, no pulsatile mass.  EXTREMITIES:  Exhibit no pitting edema, distal pulses are 2+.  SKIN:  Warm and dry.  MUSCULOSKELETAL:  No kyphosis is noted.  NEUROPSYCHIATRIC:  Patient alert  and oriented x3, affect is normal.   IMPRESSION/RECOMMENDATION:  1. Preoperative evaluation on a 76 year old male with known      cardiovascular disease.  He is being considered for a herniorrhaphy      under general anesthesia.  I spoke with Dr. Zachery Dakins today.  Mr.      [name] is stable without any active symptoms of angina or      breathlessness and is on a very reasonable medical regimen.  His      electrocardiogram is also stable.  Last ischemic evaluation was      approximately 2 years ago and was without any high risk findings.      I think he should be able to proceed with his planned surgery at an      acceptable perioperative cardiac risk, otherwise I would anticipate      seeing him back over the next 6 months for a followup surveillance      Myoview which would be closer to 3 years out from his last study.  2. Hyperlipidemia, LDL now 90, up from the 60s.  We talked about      either working on diet or weight loss versus increasing  his statin      and he prefers the former.     Jonelle Sidle, MD  Electronically Signed    SGM/MedQ  DD: 05/27/2007  DT: 05/28/2007  Job #: [id number]   cc:   Anselm Pancoast. Zachery Dakins, M.D.  Titus Dubin. Alwyn Ren, MD,FACP,FCCP

## 2010-10-24 NOTE — Assessment & Plan Note (Signed)
[facility]                            CARDIOLOGY OFFICE NOTE   NAME:Luis [name], Luis [name]                       MRN:          [id number]  DATE:12/08/2007                            DOB:          1934/09/16    PRIMARY CARE PHYSICIAN:  Titus Dubin. Alwyn Ren, MD, FACP, FCCP.   REASON FOR VISIT:  Cardiac followup.   HISTORY OF PRESENT ILLNESS:  Mr. Holycross was seen back in December.  His cardiac history is outlined in my previous note.  Today, he  underwent a followup surveillance Myoview, which overall looks to be  fairly stable.  He has a mild inferior wall defect that is partially  reversible, predominantly towards the base, consistent with an element  of inferior scar with mild peri-infarct ischemia and an ejection  fraction of 65%.  He is not reporting any angina and tolerated hernia  surgery since I last saw him.  I reviewed his prior Myoview today as  well.  He reports compliance with his medications.  He had lipids done  and reviewed with Dr. Alwyn Ren back in May.  His LDL overall has been  fairly well controlled.  Today's resting electrocardiogram showed sinus  rhythm with small inferior Q-waves and nonspecific T-wave changes.  Of  note, he does have residual distal right coronary disease at 70% that  has been managed medically and likely explains some of the perfusion  abnormalities seen on the Myoview.  We talked about this today.   ALLERGIES:  No drug allergies.   PRESENT MEDICATIONS:  1. Altace 5 mg p.o. daily.  2. Palmetto Plus 180 mg p.o. daily.  3. Glucosamine 500 mg 2 tablets p.o. daily.  4. Atenolol 12.5 mg p.o. daily.  5. Folic acid 2.4 mg p.o. daily.  6. Zetia 10 mg p.o. daily.  7. Lipitor 40 mg p.o. daily.  8. Aspirin 81 mg p.o. daily.  9. Sublingual nitroglycerin p.r.n.   REVIEW OF SYSTEMS:  As per history of present illness.  No claudication,  palpitations, or syncope.   PHYSICAL EXAMINATION:  Blood pressure is 116/80, heart rate  90.  Weight  203 pounds, down from 213.  The patient is comfortable and in no acute distress.  NECK:  No elevated jugular venous pressure. No loud bruits. No  thyromegaly is noted.  LUNGS:  Clear without labored breathing.  CARDIAC:  Regular rate and rhythm.  No S3, gallop, or loud murmur.  EXTREMITIES:  Show no pitting edema.  Distal pulses are 2+.   IMPRESSION AND RECOMMENDATIONS:  Coronary artery disease status post  previous inferior wall myocardial infarction in March 2001 treated with  bare-metal stent placement to the right coronary artery followed by  subsequent staged intervention to the circumflex also using a bare-metal  stent.  He has a residual 70% distal right coronary artery stenosis that  has been managed medically and has evidence of mild inferior scar with  mild peri-infarct ischemia by followup Myoview which is overall stable  and associated with a normal left ventricular ejection fraction of 65%  and stable symptomatology.  I will plan for him to  return in the next 6  months to see Dr. Sanjuana Kava given my transition to the [employer], and we will plan to have followup liver function and lipids  done at that time.     Jonelle Sidle, MD  Electronically Signed    SGM/MedQ  DD: 12/08/2007  DT: 12/09/2007  Job #: [id number]   cc:   Titus Dubin. Alwyn Ren, MD,FACP,FCCP

## 2010-10-27 NOTE — Procedures (Signed)
[place]  Patient:    Luis [name], Luis [name]                     MRN: [id number] Proc. Date: 09/05/99 Adm. Date:  [id number] Disc. Date: [id number] Attending:  Nelta Numbers CC:         Georg Ruddle. Viviann Spare, M.D.             Gerrit Friends. Dietrich Pates, M.D. [facility]             Cardiac Catheterization Laboratory                           Procedure Report  PROCEDURE PERFORMED: 1. Percutaneous transluminal coronary angioplasty with stent placement in    the mid-left circumflex coronary artery. 2. Percutaneous transluminal coronary angioplasty of the first obtuse    marginal branch.  CARDIOLOGIST:  Daisey Must, M.D.  INDICATIONS:  Mr. Sedor is a 76 year old male who underwent an emergent cardiac catheterization two days ago by Dr. Everardo Beals. Brodie for an acute inferior myocardial infarction.  He underwent an angioplasty and stenting of his proximal right coronary artery.  At the time of the diagnostic catheterization he was also found to have significant disease in the left circumflex, including a 95% stenosis in the midcircumflex and an 80% stenosis at the origin of a large first marginal branch.  He returned to the cardiac catheterization laboratory today for a staged intervention of the left circumflex artery.  DESCRIPTION OF PROCEDURE:  A 7-French sheath was placed in the right femoral artery. Heparin and Integrilin were administered per protocol.  We used a 7-French Voda left 3.5 mm guiding catheter and a long BMW wire.  We initially performed predilatation of the mid-left circumflex with a 3.0 mm x 15.0 mm Quantum Ranger  balloon which was inflated initially to 8, and then to 12 atmospheres. Following this we positioned a 2.75 mm x 10.0 mm cutting balloon in the ostium of the first obtuse marginal branch.  Three inflations at this site were performed initially to 6, again to 6, and then to 8 atmospheres.  We then placed a 3.0 mm x  12.0 mm NIR Royale Elite stent in the mid-left circumflex.  This was deployed at 12 atmospheres.  Final angiographic images revealed an excellent result at both sites. The mid-left circumflex artery had a 0% residual stenosis and the first marginal branch had a 20% residual stenosis.  There is TIMI-3 flow.  At the conclusion of the procedure a Perclose vascular closure device was placed on the right femoral artery with good hemostasis.  There were no complications.  RESULTS: 1. Successful percutaneous transluminal coronary angioplasty with stent    placement in the mid-left circumflex, reducing a 95% stenosis to 0%    residual, with TIMI-3 flow. 2. Successful percutaneous transluminal coronary angioplasty utilizing a    cutting balloon on the ostium of the first marginal branch, reducing an    80% stenosis to 20% residual with TIMI-3 flow.  PLAN:  Integrilin will be continued for 18 hours.  The patient will need to be treated with Plavix for the next four weeks. DD:  09/05/99 TD:  09/06/99 Job: [id number]

## 2010-10-27 NOTE — Cardiovascular Report (Signed)
[place]. [facility]  Patient:    Luis [name], Luis [name]                     MRN: [id number] Proc. Date: 04/30/00 Adm. Date:  [id number] Disc. Date: [id number] Attending:  Daisey Must CC:         Georg Ruddle. Viviann Spare, M.D.  Titus Dubin. Alwyn Ren, M.D. [facility]  Cardiac Catheterization Laboratory   Cardiac Catheterization  PROCEDURE PERFORMED:  Left heart catheterization with coronary angiography and left ventriculography.  INDICATIONS:  Mr. Limas is a 76 year old male, who is status post acute inferior myocardial infarction treated with stent placement in the right coronary artery in March of this year.  He subsequently underwent stent placement in the mid left circumflex two days later.  He was seen in the office in followup recently.  He complained of one episode of substernal chest pain.  A stress Cardiolite was performed and showed the presence of inferior infarct with mild peri-infarct ischemia.  We therefore opted to proceed with cardiac catheterization.  DESCRIPTION OF PROCEDURE:  A 6 French sheath was placed in the right femoral artery.  Standard Judkins 6 French catheters were utilized.  Contrast was Omnipaque.  There were no complications.  RESULTS:  HEMODYNAMICS:  Left ventricular pressure 116/22.  Aortic pressure 120/67.  No aortic valve gradient.  LEFT VENTRICULOGRAM:  There is moderate hypokinesis of the inferior wall. Ejection fraction estimated at 60%.  No mitral regurgitation.  CORONARY ARTERIOGRAPHY:  (Right dominant).  Left main:  Normal.  Left anterior descending:  The left anterior descending artery has a 30% stenosis in the proximal vessel.  The mid vessel has a diffuse 25% stenosis and the apical vessel has a diffuse 25% stenosis.  There is a small first diagonal branch with a 50% stenosis.  There is a small to normal sized second diagonal branch with a 70% stenosis.  Left circumflex:  The left circumflex has a 25% stenosis within  the previously placed stent.  The first obtuse marginal branch has a 30% stenosis at previous angioplasty site and this is a large branch.  The second obtuse marginal branch is normal in size and has a 50% stenosis at its origin.  Third obtuse marginal branch is normal in size and has a diffuse 50% stenosis in it.  Right coronary artery:  The right coronary artery is a dominant vessel.  There is a 30% stenosis proximally within the previously placed stent.  In the mid vessel just after the acute marginal branch is a 75% stenosis.  Following this was a diffuse 70% stenosis and in the distal vessel was a 30% stenosis.  There is a normal sized posterior descending artery and three small posterolateral branches.  IMPRESSION: 1. Left ventricular systolic function characterized by a moderate inferior    hypokinesis but overall preserved ejection fraction. 2. Moderate two-vessel coronary artery disease as described.  The changes on    the Cardiolite are most likely due to the disease in the mid and distal    right coronary artery which is moderate in severity and diffuse in nature.  PLAN:  As the patient has not had any recurrent anginal symptoms, and due to the nature of disease as described above, we will continue him on medical therapy. DD:  04/30/00 TD:  05/01/00 Job: [id number]

## 2010-10-27 NOTE — Assessment & Plan Note (Signed)
[facility]                            CARDIOLOGY OFFICE NOTE   NAME:Luis [name], Luis [name]                       MRN:          [id number]  DATE:08/09/2006                            DOB:          [birth year]    HISTORY OF PRESENT ILLNESS:  Mr. 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  This was  treated with bare metal stenting in the right coronary.  He subsequently  underwent staged intervention on the circumflex also using a bare metal  stent.  He has a 70% stenosis of the distal right coronary which has  been managed medically.   Since the infarction, he has done very well.  He has not had any  recurrent angina, exertional dyspnea, PND, orthopnea, edema,  claudication, palpitations, or syncope.  He walks 4 miles on a treadmill  4 days per week.  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   CURRENT MEDICATIONS:  1. Altace 5 mg daily.  2. Aspirin 325 mg daily.  3. Palmetto.  4. Glucosamine.  5. Atenolol 12.5 mg daily.  6. Folate 0.4 mg daily.  7. Zetia 10 mg daily.  8. Lipitor 40 mg daily.   PHYSICAL EXAMINATION:  He is generally well-appearing in no distress.  Heart rate 63, blood pressure 116/74, and weight of 209 pounds.  He has no jugular venous distension, thyromegaly, or lymphadenopathy.  LUNGS:  Clear to auscultation.  He has a nondisplaced point of maximal cardiac impulse.  There is a  regular rate and rhythm without murmur, rub, or gallop.  The abdomen is soft, nondistended, nontender.  There is no  hepatosplenomegaly.  Bowel sounds are normal.  The extremities are warm without cyanosis, clubbing, or edema,  ulceration.  Carotid pulse is 2+ bilaterally without bruit.   Electrocardiogram demonstrates sinus bradycardia with 1st degree AV  block.  He is otherwise normal.   LABORATORY STUDIES:  Dated May 07, 2006:  Remarkable for hematocrit  43, LDL 62, HDL 40, AST  22, ALT 23, creatinine 1.0, hemoglobin A1c 5.8.   IMPRESSION/RECOMMENDATIONS:  1. Coronary disease:  Prior inferior myocardial infarction, ejection      fraction normal.  Continue current medical therapy including      aspirin, beta blocker, angiotensin-converting enzyme      inhibitor.  Has not tolerated higher doses of beta blockade.  2. Hypercholesterolemia:  Managed by Dr. Alwyn Ren.  Recent profile looks      excellent.     Salvadore Farber, MD  Electronically Signed    WED/MedQ  DD: 08/09/2006  DT: 08/09/2006  Job #: [id number]

## 2010-10-27 NOTE — Discharge Summary (Signed)
[place]  Patient:    Luis [name], Luis [name]                     MRN: [id number] Adm. Date:  [id number] Disc. Date: [id number] Attending:  Nelta Numbers Dictator:   Tereso Newcomer, P.A. CC:         Daisey Must, M.D. [facility]             Georg Ruddle. Viviann Spare, M.D., [address], [place], [place] [address]                           Discharge Summary  DATE OF BIRTH:  1935-06-10  DISCHARGE DIAGNOSES: 1. Coronary artery disease, status post acute diaphragmatic myocardial    infarction, status post urgent cardiac catheterization with percutaneous    transluminal coronary angioplasty and stent to right coronary artery;    reduction in stenosis from 95-0% on September 03, 1999. 2. Status post percutaneous transluminal coronary angioplasty and stent to mid    left circumflex, percutaneous transluminal coronary angioplasty with    cutting balloon to first obtuse marginal on September 05, 1999. 3. Cardiac catheterization on September 03, 1999 revealed left anterior descending    with 30% proximal and mid 70% diagonal 1, 70% diagonal 2.  Circumflex 95%    mid, 90% obtuse marginal.  Right coronary artery 95% proximal, 70% distal.    Ejection fraction 55-60%.  Percutaneous coronary intervention performed as    above. 4. History of tobacco abuse. 5. History of hypertension.  ADMISSION HISTORY:  This 76 year old male with no prior cardiac history was seen in the ER on the morning of admission with substernal chest pain that lasted all night.  He described it as indigestion.  He did report some jaw pain.  His EKG did show inferior ST elevations.  He was taken urgently to the catheterization lab, where PTCA and stent was placed to the RCA lesion noted above.  Residual 95% circumflex, 90% OM, 70% RI, 70% diagonal remained.  PHYSICAL EXAMINATION:  GENERAL:  His admission physical exam revealed a well-developed, well-nourished male in no apparent distress.  VITAL SIGNS:   Blood pressure 126/70.  CHEST:  Clear.  HEART:  Regular rate and rhythm without murmurs or rubs.  EXTREMITIES:  Without edema.  NEUROLOGIC:  Grossly intact.  LABORATORY DATA:  Chest x-ray revealed no active lung disease.  Borderline cardiomegaly.  Sodium 131, potassium 3.4, chloride 99, CO2 24, glucose 216, BUN 16, creatinine 0.9, calcium 9.1, total protein 7.1, albumin 4.1, AST 43, ALT 27, alkaline phosphatase 75, total bilirubin 0.6.  WBC 9.8, RBC 5.14, hemoglobin 16.1, hematocrit 46.2, platelet count 282.  PT 13.0, INR 1.0, PTT 29.  Cardiac enzymes:  Total CK #1 209, CK-MB 19.0, troponin-I 0.10; total CK #2 619, CK-MB 98.4; total CK #3 580, CK-MB 80.2, troponin-I 3.07.  Lipid profile:  Total cholesterol 171, triglycerides 205, HDL 37, LDL 93.  HOSPITAL COURSE:  The patient was urgently taken to the cardiac catheterization lab for stent placement to the RCA, which was felt to be the culprit lesion.  He remained stable after his procedure.  He again went for PCI on September 05, 1999.  He had PTCA and stent to the mid left circumflex, as well as PTCA with a cutting balloon to OM1.  He was maintained on heparin and Integrilin after his procedure.  He continued to do well and had no complications.  He was carefully placed on beta blockers and ACE inhibitor during his admission.  His potassium was repleted effectively.  Smoking cessation was discussed with him at the time of discharge.  He had been considering asking for Zyban therapy.  His labs at the time of discharge showed sodium 139, potassium 4.6, BUN and creatinine 12 and 0.9.  Hemoglobin 13.0, hematocrit 38.3.  On September 06, 1999, it was felt that the patient was stable enough to be discharged to home.  DISCHARGE MEDICATIONS: 1. Toprol XL 50 mg 1 q.d. 2. Altace 5 mg 1 q.d. 3. Plavix 75 mg 1 q.d. for four weeks. 4. Enteric-coated aspirin 325 mg q.d. 5. Nitroglycerin 0.4 mg sublingual p.r.n. chest pain.  ACTIVITY:  The  patients activity is to be limited to no driving, heavy lifting, sexual activity, or exertional activity for three days.  DIET:  His diet should include a low fat/low cholesterol/low sodium diet.  WOUND CARE:  The patients wound care should include watching his groin for increased swelling, bleeding, or bruising and call our office with any concerns.  FOLLOW-UP:  The patient has follow-up with Dr. Gerri Spore on October 04, 1999 at 10:15 in the morning.  He should follow up with Dr. Viviann Spare as needed.  DISCHARGE INSTRUCTIONS:  The patient has been instructed to stop smoking. DD:  09/06/99 TD:  09/06/99 Job: [id number]

## 2010-10-27 NOTE — Cardiovascular Report (Signed)
Luis [name]. Luis [name]  Patient:    Luis [name], Luis [name]                     MRN: [id number] Proc. Date: 09/03/99 Adm. Date:  [id number] Attending:  Nelta [name] CC:         Luis [name]. Luis [name], M.D.             Luis [name]. Luis [name], M.D. [name]                        Cardiac Catheterization  INDICATIONS FOR PROCEDURE:  Luis [name] is 76 years old and has had no prior history of heart disease.  He has had intermittent chest pain for two days but ad the onset of continuous pain at 7:00 this morning and came to the emergency room at 8:19 a.m.  His EKG was not quite diagnostic with 0.5 mm inferior ST elevation and 0.5 mm anterior ST depression.  He was initially treated medically but, after some persistence of mild ST elevation, decision was made to bring him to the catheterization laboratory.  DESCRIPTION OF PROCEDURE:  The procedure was performed via the right femoral artery ______ and 6 French preformed coronary catheters.  A femoral arterial puncture as performed and Visipaque contrast was used.  After we recognized there was a critical lesion in the proximal right coronary artery, preparation was made for  angioplasty.  The patient had already been started on an Aggrastat drip which was continued.  He was given weight-adjusted heparin ______ greater than 200 seconds, and was given 300 mg of Plavix.  He had already been given aspirin.  We initially used an 8 [place] JR-4 guiding catheter with side holes and a long, extra support floppy wire. We crossed the lesion in the mid right coronary artery without difficulty.  We went in with a 1.5 X-SIZER thrombectomy catheter.  We made two passes but were unable to cross the lesion.  We then went in with a 2.5 Cross Sail balloon and performed two inflations up to 10 atmospheres for 30 seconds.  We then deployed an 18 mm 3.5 Tristar stent with one inflation of 16 atmospheres for 45  seconds.  We then  pulse dilated with a 15 mm, 3.75 Quantum Ranger performing one inflation of 17 atmospheres for 40 seconds.  Repeat diagnostic segment ______ with the guiding catheter.  Patient tolerated the procedure well and left the laboratory in satisfactory condition.  RESULTS:  The aortic pressure was 105/66, with a mean of 82.  Left ventricular pressure was 105/22.  Left main coronary artery:  The left main coronary artery is free of disease.  Left anterior descending:  The left anterior descending artery had a 30% proximal and 30% mid stenosis.  The LAD gave rise to two diagonal branches and three septal perforators.  The first diagonal branch had a 70% segmental stenosis.  The second diagonal branch had multiple 70 and 80% stenoses.  The LAD was irregular throughout its course but there were no other severe blockages.  Circumflex artery:  The circumflex artery gave rise to a marginal branch and two posterior lateral branches.  There was 95% stenosis in the mid circumflex before the marginal branch.  There was 905 ostial stenosis of the first marginal branch.  Right coronary artery:  The right coronary artery is a dominant vessel that gave rise to a conus and atrial branch, two right ventricular branches, a posterior descending  branch, and three small posterior lateral branches.  There was 95% proximal stenosis with TIMI 2 flow.  The distal vessel had 40% stenosis in its id to distal portion and 70% stenosis which was long and segmental in its distal portion.  Left ventriculogram:  The left ventriculogram performed in the RAO projection showed hypokinesis of the inferobasilar segment.  The rest of the wall motion was good and the estimated ejection fraction was 55 to 60%.  Following attempt to cross with the X-SIZER, the stenosis remained at 95% and the flow had decreased from TIMI 2 to TIMI 1 flow.  Following balloon inflation, the flow had increased back to TIMI 3 flow.   Following stent implantation, the stenosis improved from 95% to 0% and the flow improved to TIMI 3 flow.  CONCLUSION: 1. Acute non-Q-wave inferior myocardial infarction with 95% proximal stenosis in    the right coronary artery with TIMI 2 flow, 40 and 70% distal right coronary    artery stenosis, 95% mid circumflex stenosis with 90% ostial stenosis in the    marginal branch, 30% proximal and mid stenosis in the left anterior descending    artery with 70% stenosis in the first and second diagonal branches.  An    inferobasilar wall hypokinesis. 2. Successful stent deployment in the proximal right coronary artery with    improvement in the stented area from 95% to 0%, improvement in the flow from    TIMI 2 to TIMI 3 flow.  DISPOSITION:  The patient had the onset of chest pain at 7 a.m. and arrived at ur emergency room at [name] at 8:19 a.m.  He was initially treated medically and, so, he did not come to the catheterization laboratory until later, and the first X-SIZER pass was performed at 1408.  Will plan to get the sheath out in a couple hours and continue Aggrastat for 18 hours.  He will need  intervention in the circumflex artery in the next few days. DD:  09/03/99 TD:  09/05/99 Job: [id number] Luis [name]

## 2010-12-12 ENCOUNTER — Other Ambulatory Visit: Payer: Self-pay | Admitting: *Deleted

## 2010-12-12 MED ORDER — ATENOLOL 25 MG PO TABS: 25.0000 mg | ORAL_TABLET | Freq: Every day | ORAL | Status: DC

## 2011-03-16 LAB — DIFFERENTIAL
Basophils Absolute: 0
Basophils Relative: 0
Lymphocytes Relative: 33
Monocytes Absolute: 0.5
Neutro Abs: 3
Neutrophils Relative %: 56

## 2011-03-16 LAB — COMPREHENSIVE METABOLIC PANEL
Albumin: 3.8
Alkaline Phosphatase: 63
BUN: 15
Chloride: 104
Creatinine, Ser: 1.04
Glucose, Bld: 95
Total Bilirubin: 0.6
Total Protein: 6.1

## 2011-03-16 LAB — CBC
HCT: 40.2
Hemoglobin: 14
MCV: 89.9
Platelets: 244
RDW: 13.4

## 2011-05-22 ENCOUNTER — Other Ambulatory Visit: Payer: Self-pay | Admitting: Internal Medicine

## 2011-06-15 ENCOUNTER — Telehealth: Payer: Self-pay | Admitting: Cardiovascular Disease

## 2011-06-15 DIAGNOSIS — I10 Essential (primary) hypertension: Secondary | ICD-10-CM

## 2011-06-15 DIAGNOSIS — E785 Hyperlipidemia, unspecified: Secondary | ICD-10-CM

## 2011-06-15 DIAGNOSIS — I2510 Atherosclerotic heart disease of native coronary artery without angina pectoris: Secondary | ICD-10-CM

## 2011-06-15 NOTE — Telephone Encounter (Signed)
LMTCB, need to see if PCP has done fasting labs.

## 2011-06-15 NOTE — Telephone Encounter (Signed)
New problem:  Patient would like to be set up for lab work prior to appt in feb.

## 2011-06-18 NOTE — Telephone Encounter (Signed)
Left message to call back  

## 2011-06-19 NOTE — Telephone Encounter (Signed)
Called work number listed as call back number for today's call but pt had left for the day. Left message on home and cell phone numbers for pt to call back

## 2011-06-19 NOTE — Telephone Encounter (Signed)
Fu call °Patient returning your call °

## 2011-06-20 ENCOUNTER — Telehealth: Payer: Self-pay | Admitting: Cardiovascular Disease

## 2011-06-20 NOTE — Telephone Encounter (Signed)
Spoke with pt and he would like to set up appt to have labs done at [facility] prior to upcoming appt with Dr. Clifton James. Will order lab work pt has had done in past and make appt for June 25, 2010. I told him Dr. Clifton James would address lipid and liver profiles and that testosterone, PSA and TSH are followed by primary care.  He is planning to schedule follow up appt with Dr. Alwyn Ren.

## 2011-06-20 NOTE — Telephone Encounter (Signed)
Follow-up:    Patient called attempting to follow up on the previous calls. Patient will be difficult to reach for the rest of the day.  Patient is trying to get set up for some lab work at Dr. Frederik Pear office, but says that he will be hard to reach for the rest of the day. Please call back.

## 2011-06-20 NOTE — Telephone Encounter (Signed)
See previous phone note.  

## 2011-06-26 ENCOUNTER — Other Ambulatory Visit (INDEPENDENT_AMBULATORY_CARE_PROVIDER_SITE_OTHER): Payer: Medicare Other

## 2011-06-26 ENCOUNTER — Other Ambulatory Visit: Payer: Medicare Other

## 2011-06-26 DIAGNOSIS — I10 Essential (primary) hypertension: Secondary | ICD-10-CM

## 2011-06-26 DIAGNOSIS — Z125 Encounter for screening for malignant neoplasm of prostate: Secondary | ICD-10-CM

## 2011-06-26 DIAGNOSIS — I2510 Atherosclerotic heart disease of native coronary artery without angina pectoris: Secondary | ICD-10-CM

## 2011-06-26 DIAGNOSIS — E785 Hyperlipidemia, unspecified: Secondary | ICD-10-CM

## 2011-06-26 LAB — HEPATIC FUNCTION PANEL
AST: 27 U/L (ref 0–37)
Alkaline Phosphatase: 59 U/L (ref 39–117)
Bilirubin, Direct: 0.2 mg/dL (ref 0.0–0.3)
Total Bilirubin: 1 mg/dL (ref 0.3–1.2)

## 2011-06-26 LAB — URINALYSIS
Hgb urine dipstick: NEGATIVE
Ketones, ur: NEGATIVE
Total Protein, Urine: NEGATIVE
Urine Glucose: NEGATIVE

## 2011-06-26 LAB — BASIC METABOLIC PANEL
BUN: 15 mg/dL (ref 6–23)
CO2: 25 mEq/L (ref 19–32)
Chloride: 104 mEq/L (ref 96–112)
Creatinine, Ser: 1 mg/dL (ref 0.4–1.5)
Potassium: 4.3 mEq/L (ref 3.5–5.1)

## 2011-06-26 LAB — CBC WITH DIFFERENTIAL/PLATELET
Basophils Relative: 1.1 % (ref 0.0–3.0)
Eosinophils Absolute: 0.1 10*3/uL (ref 0.0–0.7)
Eosinophils Relative: 2.5 % (ref 0.0–5.0)
HCT: 43.3 % (ref 39.0–52.0)
Lymphs Abs: 1.6 10*3/uL (ref 0.7–4.0)
MCHC: 35 g/dL (ref 30.0–36.0)
MCV: 91.9 fl (ref 78.0–100.0)
Monocytes Absolute: 0.5 10*3/uL (ref 0.1–1.0)
Neutrophils Relative %: 47.8 % (ref 43.0–77.0)
RBC: 4.72 Mil/uL (ref 4.22–5.81)
WBC: 4.3 10*3/uL — ABNORMAL LOW (ref 4.5–10.5)

## 2011-06-26 LAB — LIPID PANEL
LDL Cholesterol: 73 mg/dL (ref 0–99)
Total CHOL/HDL Ratio: 3
Triglycerides: 51 mg/dL (ref 0.0–149.0)

## 2011-06-26 LAB — PSA: PSA: 1.32 ng/mL (ref 0.10–4.00)

## 2011-07-02 ENCOUNTER — Telehealth: Payer: Self-pay | Admitting: Cardiovascular Disease

## 2011-07-02 NOTE — Telephone Encounter (Signed)
Fu call °Patient returning your call about lab results  °

## 2011-07-02 NOTE — Telephone Encounter (Signed)
Spoke with pt and reviewed lab results

## 2011-07-13 ENCOUNTER — Ambulatory Visit: Payer: PRIVATE HEALTH INSURANCE | Admitting: Cardiovascular Disease

## 2011-07-16 ENCOUNTER — Encounter: Payer: Self-pay | Admitting: *Deleted

## 2011-07-16 ENCOUNTER — Encounter: Payer: Self-pay | Admitting: Cardiovascular Disease

## 2011-07-17 ENCOUNTER — Ambulatory Visit (INDEPENDENT_AMBULATORY_CARE_PROVIDER_SITE_OTHER): Payer: Medicare Other | Admitting: Cardiovascular Disease

## 2011-07-17 ENCOUNTER — Encounter: Payer: Self-pay | Admitting: Cardiovascular Disease

## 2011-07-17 VITALS — BP 116/77 | HR 59 | Ht 72.0 in | Wt 209.0 lb

## 2011-07-17 DIAGNOSIS — E785 Hyperlipidemia, unspecified: Secondary | ICD-10-CM

## 2011-07-17 DIAGNOSIS — I6529 Occlusion and stenosis of unspecified carotid artery: Secondary | ICD-10-CM

## 2011-07-17 DIAGNOSIS — I2510 Atherosclerotic heart disease of native coronary artery without angina pectoris: Secondary | ICD-10-CM

## 2011-07-17 NOTE — Assessment & Plan Note (Signed)
Mild disease by dopplers 2011. Will need repeat dopplers December 2013.

## 2011-07-17 NOTE — Progress Notes (Signed)
History of Present Illness:Luis [name] is a pleasant 77 year old white male with a past medical history significant for coronary artery disease status post placement of a bare-metal stent in the right coronary artery in 2001 with a staged intervention to the circumflex coronary artery with placement of a bare-metal stent in the mid circumflex vessel and balloon angioplasty of the ostium of the first obtuse marginal coronary artery.  He has a residual 70% stenosis in the distal right coronary artery as well as nonobstructive disease in the other vessels that has been managed medically over the last several years. Carotid artery dopplers 06/07/10 with mild bilateral disease.   He comes in today for follow up.  He tells me that he continues to work full-time and exercises several days per week.  He has experienced no exertional chest pain. He does note dyspnea with exertion which is new over the last few months.  He works out with a Psychologist, educational 3 days per week and walks 4 days per week.  He denies palpitations, any awareness of irregularity of his heart rhythm, orthopnea, PND, or lower extremity edema.    His primary care is Dr. Alwyn Ren. Most recent lipids are: total chol: 132  HDL:  49  LDL:  73  Past Medical History  Diagnosis Date  . Unspecified essential hypertension   . HYPERLIPIDEMIA   . CORONARY ARTERY DISEASE   . COLONIC POLYPS, HX OF   . DJD (degenerative joint disease)     Past Surgical History  Procedure Date  . Cath 2001: stents  patent 05/2000   . Cataract surgery bilat 2007   . Laser surgery after lens implant os   . Inguinal hernia repair 12/08, dr Zachery Dakins     Current Outpatient Prescriptions  Medication Sig Dispense Refill  . aspirin 81 MG tablet Take 81 mg by mouth daily.        Marland Kitchen atenolol (TENORMIN) 25 MG tablet Take 1 tablet (25 mg total) by mouth daily.  30 tablet  6  . atorvastatin (LIPITOR) 80 MG tablet Take 80 mg by mouth daily.        Marland Kitchen ezetimibe (ZETIA) 10 MG  tablet Take 10 mg by mouth daily.        . folic acid (FOLVITE) 1 MG tablet TAKE 1 TABLET ONCE DAILY.  100 tablet  1  . nitroGLYCERIN (NITROSTAT) 0.4 MG SL tablet Place 0.4 mg under the tongue every 5 (five) minutes as needed.          Not on File  History   Social History  . Marital Status: Married    Spouse Name: N/A    Number of Children: N/A  . Years of Education: N/A   Occupational History  . Not on file.   Social History Main Topics  . Smoking status: Former Smoker    Types: Cigarettes    Quit date: 06/12/1999  . Smokeless tobacco: Not on file  . Alcohol Use: No  . Drug Use: No  . Sexually Active: Not on file   Other Topics Concern  . Not on file   Social History Narrative  . No narrative on file    Family History  Problem Relation Age of Onset  . Colon cancer      Review of Systems:  As stated in the HPI and otherwise negative.   BP 116/77  Pulse 59  Ht 6' (1.829 m)  Wt 209 lb (94.802 kg)  BMI 28.35 kg/m2  Physical Examination: General: Well  developed, well nourished, NAD HEENT: OP clear, mucus membranes moist SKIN: warm, dry. No rashes. Neuro: No focal deficits Musculoskeletal: Muscle strength 5/5 all ext Psychiatric: Mood and affect normal Neck: No JVD, no carotid bruits, no thyromegaly, no lymphadenopathy. Lungs:Clear bilaterally, no wheezes, rhonci, crackles Cardiovascular: Regular rate and rhythm. No murmurs, gallops or rubs. Abdomen:Soft. Bowel sounds present. Non-tender.  Extremities: No lower extremity edema. Pulses are 2 + in the bilateral DP/PT.  EKG: Sinus bradycardia, rate 54 bpm. 1st degree AV block.  PVC.

## 2011-07-17 NOTE — Patient Instructions (Signed)
Your physician wants you to follow-up in: 6 months. You will receive a reminder letter in the mail two months in advance. If you don't receive a letter, please call our office to schedule the follow-up appointment.   Your physician has requested that you have en exercise stress myoview. For further information please visit [email or web address]. Please follow instruction sheet, as given.    

## 2011-07-17 NOTE — Assessment & Plan Note (Signed)
Lipids are well controlled. ?

## 2011-07-17 NOTE — Assessment & Plan Note (Signed)
He is known to have moderate CAD and has had previous stenting. With dyspnea on exertion, will get exercise stress myoview to exclude ischemia. He has had no stress testing or coronary imaging for at least the last five years.

## 2011-07-25 ENCOUNTER — Ambulatory Visit (HOSPITAL_COMMUNITY): Payer: Medicare Other | Attending: Cardiovascular Disease | Admitting: Radiology

## 2011-07-25 VITALS — BP 139/79 | Ht 72.0 in | Wt 204.0 lb

## 2011-07-25 DIAGNOSIS — E785 Hyperlipidemia, unspecified: Secondary | ICD-10-CM

## 2011-07-25 DIAGNOSIS — I4949 Other premature depolarization: Secondary | ICD-10-CM

## 2011-07-25 DIAGNOSIS — R42 Dizziness and giddiness: Secondary | ICD-10-CM | POA: Insufficient documentation

## 2011-07-25 DIAGNOSIS — I2510 Atherosclerotic heart disease of native coronary artery without angina pectoris: Secondary | ICD-10-CM

## 2011-07-25 DIAGNOSIS — R0602 Shortness of breath: Secondary | ICD-10-CM

## 2011-07-25 DIAGNOSIS — R0609 Other forms of dyspnea: Secondary | ICD-10-CM | POA: Insufficient documentation

## 2011-07-25 DIAGNOSIS — R0989 Other specified symptoms and signs involving the circulatory and respiratory systems: Secondary | ICD-10-CM | POA: Insufficient documentation

## 2011-07-25 DIAGNOSIS — Z87891 Personal history of nicotine dependence: Secondary | ICD-10-CM | POA: Insufficient documentation

## 2011-07-25 DIAGNOSIS — I10 Essential (primary) hypertension: Secondary | ICD-10-CM | POA: Insufficient documentation

## 2011-07-25 DIAGNOSIS — I779 Disorder of arteries and arterioles, unspecified: Secondary | ICD-10-CM | POA: Insufficient documentation

## 2011-07-25 MED ORDER — TECHNETIUM TC 99M TETROFOSMIN IV KIT
33.0000 | PACK | Freq: Once | INTRAVENOUS | Status: AC | PRN
  Administered 2011-07-25: 33 via INTRAVENOUS

## 2011-07-25 MED ORDER — TECHNETIUM TC 99M TETROFOSMIN IV KIT
11.0000 | PACK | Freq: Once | INTRAVENOUS | Status: AC | PRN
  Administered 2011-07-25: 11 via INTRAVENOUS

## 2011-07-25 NOTE — Progress Notes (Signed)
`  [facility] SITE 3 NUCLEAR MED [address] [place] [address] [phone]  Cardiology Nuclear Med Study  Luis [name] is a 77 y.o. male [id number] 01/29/1935   Nuclear Med Background Indication for Stress Test:  Evaluation for Ischemia and PTCA/Stent Patency History:  '07 Stress Echo:No ischemia; 3/01 IWMI>PTCA-OM-1/Stent-RCA/CFX; 11/01 Cath:Patent Stents with N/O CAD; '09 GMW:NUUVOZDG infarct with peri-infarct ischemia, EF=65% Cardiac Risk Factors: Carotid Disease, History of Smoking, Hypertension and Lipids  Symptoms:  Dizziness and DOE   Nuclear Pre-Procedure Caffeine/Decaff Intake:  None NPO After: 8:00am   Lungs:  Clear. IV 0.9% NS with Angio Cath:  20g  IV Site: R Antecubital  IV Started by:  Stanton Kidney, EMT-P  Chest Size (in):  44 Cup Size: n/a  Height: 6' (1.829 m)  Weight:  204 lb (92.534 kg)  BMI:  Body mass index is 27.67 kg/(m^2). Tech Comments:  Atenolol held > 24 hours, per patient.    Nuclear Med Study 1 or 2 day study: 1 day  Stress Test Type:  Stress  Reading MD: Charlton Haws, MD  Order Authorizing Provider:  Verne Carrow, MD  Resting Radionuclide: Technetium 80m Tetrofosmin  Resting Radionuclide Dose: 11.0 mCi   Stress Radionuclide:  Technetium 47m Tetrofosmin  Stress Radionuclide Dose: 33.0 mCi           Stress Protocol Rest HR: 62 Stress HR: 131  Rest BP: Sitting 139/79  Standing 146/87 Stress BP: 219/92  Exercise Time (min): 9:00 METS: 10.1   Predicted Max HR: 146 bpm % Max HR: 89.73 bpm Rate Pressure Product: 64403   Dose of Adenosine (mg):  n/a Dose of Lexiscan: n/a mg  Dose of Atropine (mg): n/a Dose of Dobutamine: n/a mcg/kg/min (at max HR)  Stress Test Technologist: Smiley Houseman, CMA-N  Nuclear Technologist:  Domenic Polite, CNMT     Rest Procedure:  Myocardial perfusion imaging was performed at rest 45 minutes following the intravenous administration of Technetium 70m Tetrofosmin.  Rest ECG: 1st  degree AVB with occasional PVC's and PAC's  Stress Procedure:  The patient exercised for nine minutes on the treadmill utilizing the Bruce protocol.  The patient stopped due to fatigue and denied any chest pain.  There were nonspecific ST-T wave changes with occasional PVC's and PAC's.  He had a hypertensive response to exercise, 219/92.  Technetium 83m Tetrofosmin was injected at peak exercise and myocardial perfusion imaging was performed after a brief delay.  Stress ECG: 1 mm upsloping ST segment depression in lateral leads that became more horizontal in recovery  QPS Raw Data Images:  Patient motion noted. Stress Images:  There is decreased uptake in the inferior wall. Rest Images:  Normal homogeneous uptake in all areas of the myocardium. Subtraction (SDS):  SDS 6 abnormal in inferoseptum Transient Ischemic Dilatation (Normal <1.22):  0.91 Lung/Heart Ratio (Normal <0.45):  0.33  Quantitative Gated Spect Images QGS EDV:  n/a QGS ESV:  n/a QGS cine images:  Study not gated QGS EF: Study not gated  Impression Exercise Capacity:  Fair exercise capacity. BP Response:  Hypertensive blood pressure response. Clinical Symptoms:  No chest pain. ECG Impression:  See above abnormal Comparison with Prior Nuclear Study: No images to compare  Overall Impression:  Mild inferior wall ischemia at mid and basal level.  Borderline abnormal ECG  Hypertensive response to exercise   Charlton Haws

## 2011-07-26 ENCOUNTER — Encounter (HOSPITAL_COMMUNITY): Payer: Self-pay | Admitting: Pharmacy Technician

## 2011-07-26 ENCOUNTER — Encounter: Payer: Self-pay | Admitting: *Deleted

## 2011-07-26 ENCOUNTER — Ambulatory Visit (INDEPENDENT_AMBULATORY_CARE_PROVIDER_SITE_OTHER): Payer: Medicare Other | Admitting: Cardiovascular Disease

## 2011-07-26 ENCOUNTER — Encounter: Payer: Self-pay | Admitting: Cardiovascular Disease

## 2011-07-26 VITALS — BP 149/89 | HR 72 | Ht 73.0 in | Wt 207.0 lb

## 2011-07-26 DIAGNOSIS — I2510 Atherosclerotic heart disease of native coronary artery without angina pectoris: Secondary | ICD-10-CM

## 2011-07-26 NOTE — Patient Instructions (Signed)
Your physician recommends that you schedule a follow-up appointment in: 4 weeks.   Your physician has requested that you have a cardiac catheterization. Cardiac catheterization is used to diagnose and/or treat various heart conditions. Doctors may recommend this procedure for a number of different reasons. The most common reason is to evaluate chest pain. Chest pain can be a symptom of coronary artery disease (CAD), and cardiac catheterization can show whether plaque is narrowing or blocking your heart's arteries. This procedure is also used to evaluate the valves, as well as measure the blood flow and oxygen levels in different parts of your heart. For further information please visit [email or web address] Please follow instruction sheet, as given. Scheduled for July 30, 2011

## 2011-07-26 NOTE — Assessment & Plan Note (Signed)
He is known to have moderate CAD. Last cath in 2001. 75% RCA stenosis. Stress myoview with inferior wall ischemia. I will arrange a left heart cath on Monday February 18th at [facility]. Risks and benefits reviewed with pt. Right radial access. Pre-cath labs today.

## 2011-07-26 NOTE — Progress Notes (Signed)
History of Present Illness:Luis [name] is a pleasant 77 year old white male here today for follow up. He has a past medical history significant for coronary artery disease status post placement of a bare-metal stent in the right coronary artery in 2001 with a staged intervention to the circumflex coronary artery with placement of a bare-metal stent in the mid circumflex vessel and balloon angioplasty of the ostium of the first obtuse marginal coronary artery. He has a residual 75% stenosis in the right coronary artery as well as nonobstructive disease in the other vessels that has been managed medically over the last several years. Carotid artery dopplers 06/07/10 with mild bilateral disease. I saw him 07/17/11 for routine f/u and he c/o dyspnea with exertion. He continues to work full-time and exercises several days per week. He has experienced no exertional chest pain. His dyspnea is new over the last few months.  He works out with a Psychologist, educational 3 days per week and walks 4 days per week. He denied palpitations, any awareness of irregularity of his heart rhythm, orthopnea, PND, or lower extremity edema.   I arranged a stress myoview on 07/25/11 which showed inferior wall ischemia. LVEF was not calculated since the study was not gated. I asked him to come in today to discuss his stress test. He still has dyspnea with exertion. No other new complaints.   Most recent lipids are: total chol: 132 HDL: 49 LDL: 73   Primary Care Physician:  Dr. Alwyn Ren  Last Lipid Profile:  Past Medical History  Diagnosis Date  . Unspecified essential hypertension   . HYPERLIPIDEMIA   . CORONARY ARTERY DISEASE   . COLONIC POLYPS, HX OF   . DJD (degenerative joint disease)     Past Surgical History  Procedure Date  . Cath 2001: stents  patent 05/2000   . Cataract surgery bilat 2007   . Laser surgery after lens implant os   . Inguinal hernia repair 12/08, dr Zachery Dakins     Current Outpatient Prescriptions    Medication Sig Dispense Refill  . aspirin 81 MG tablet Take 81 mg by mouth daily.        Marland Kitchen atenolol (TENORMIN) 25 MG tablet Take 1 tablet (25 mg total) by mouth daily.  30 tablet  6  . atorvastatin (LIPITOR) 80 MG tablet Take 80 mg by mouth daily.        Marland Kitchen ezetimibe (ZETIA) 10 MG tablet Take 10 mg by mouth daily.        . folic acid (FOLVITE) 1 MG tablet TAKE 1 TABLET ONCE DAILY.  100 tablet  1  . nitroGLYCERIN (NITROSTAT) 0.4 MG SL tablet Place 0.4 mg under the tongue every 5 (five) minutes as needed.          No Known Allergies  History   Social History  . Marital Status: Married    Spouse Name: N/A    Number of Children: N/A  . Years of Education: N/A   Occupational History  . Not on file.   Social History Main Topics  . Smoking status: Former Smoker    Types: Cigarettes    Quit date: 06/12/1999  . Smokeless tobacco: Not on file  . Alcohol Use: No  . Drug Use: No  . Sexually Active: Not on file   Other Topics Concern  . Not on file   Social History Narrative  . No narrative on file    Family History  Problem Relation Age of Onset  . Colon cancer  Review of Systems:  As stated in the HPI and otherwise negative.   BP 149/89  Pulse 72  Ht 6\' 1"  (1.854 m)  Wt 207 lb (93.895 kg)  BMI 27.31 kg/m2  Physical Examination: General: Well developed, well nourished, NAD HEENT: OP clear, mucus membranes moist SKIN: warm, dry. No rashes. Neuro: No focal deficits Musculoskeletal: Muscle strength 5/5 all ext Psychiatric: Mood and affect normal Neck: No JVD, no carotid bruits, no thyromegaly, no lymphadenopathy. Lungs:Clear bilaterally, no wheezes, rhonci, crackles Cardiovascular: Regular rate and rhythm. No murmurs, gallops or rubs. Abdomen:Soft. Bowel sounds present. Non-tender.  Extremities: No lower extremity edema. Pulses are 2 + in the bilateral DP/PT.  Stress myoview: 07/25/11:  Stress Procedure: The patient exercised for nine minutes on the treadmill  utilizing the Bruce protocol. The patient stopped due to fatigue and denied any chest pain. There were nonspecific ST-T wave changes with occasional PVC's and PAC's. He had a hypertensive response to exercise, 219/92. Technetium 63m Tetrofosmin was injected at peak exercise and myocardial perfusion imaging was performed after a brief delay.  Stress ECG: 1 mm upsloping ST segment depression in lateral leads that became more horizontal in recovery  QPS  Raw Data Images: Patient motion noted.  Stress Images: There is decreased uptake in the inferior wall.  Rest Images: Normal homogeneous uptake in all areas of the myocardium.  Subtraction (SDS): SDS 6 abnormal in inferoseptum  Transient Ischemic Dilatation (Normal <1.22): 0.91  Lung/Heart Ratio (Normal <0.45): 0.33  Quantitative Gated Spect Images  QGS EDV: n/a  QGS ESV: n/a  QGS cine images: Study not gated  QGS EF: Study not gated  Impression  Exercise Capacity: Fair exercise capacity.  BP Response: Hypertensive blood pressure response.  Clinical Symptoms: No chest pain.  ECG Impression: See above abnormal  Comparison with Prior Nuclear Study: No images to compare  Overall Impression: Mild inferior wall ischemia at mid and basal level. Borderline abnormal ECG Hypertensive response to exercise

## 2011-07-27 LAB — BASIC METABOLIC PANEL
BUN: 13 mg/dL (ref 6–23)
CO2: 26 mEq/L (ref 19–32)
Chloride: 104 mEq/L (ref 96–112)
Potassium: 4.1 mEq/L (ref 3.5–5.1)

## 2011-07-27 LAB — CBC WITH DIFFERENTIAL/PLATELET
Basophils Relative: 0.5 % (ref 0.0–3.0)
Eosinophils Relative: 1.6 % (ref 0.0–5.0)
HCT: 43.3 % (ref 39.0–52.0)
Lymphs Abs: 1.7 10*3/uL (ref 0.7–4.0)
MCHC: 34 g/dL (ref 30.0–36.0)
MCV: 92.4 fl (ref 78.0–100.0)
Monocytes Absolute: 0.4 10*3/uL (ref 0.1–1.0)
Platelets: 211 10*3/uL (ref 150.0–400.0)
RBC: 4.69 Mil/uL (ref 4.22–5.81)
WBC: 4.8 10*3/uL (ref 4.5–10.5)

## 2011-07-30 ENCOUNTER — Ambulatory Visit (HOSPITAL_COMMUNITY)
Admission: RE | Admit: 2011-07-30 | Discharge: 2011-07-31 | Disposition: A | Discharge status: Home / Self Care | Payer: Medicare Other | Source: Ambulatory Visit | Attending: Cardiovascular Disease | Admitting: Cardiovascular Disease

## 2011-07-30 ENCOUNTER — Encounter (HOSPITAL_COMMUNITY): Payer: Self-pay | Admitting: General Practice

## 2011-07-30 ENCOUNTER — Other Ambulatory Visit: Payer: Self-pay

## 2011-07-30 ENCOUNTER — Encounter (HOSPITAL_COMMUNITY)
Admission: RE | Disposition: A | Discharge status: Home / Self Care | Payer: Self-pay | Source: Ambulatory Visit | Attending: Cardiovascular Disease

## 2011-07-30 DIAGNOSIS — R0989 Other specified symptoms and signs involving the circulatory and respiratory systems: Secondary | ICD-10-CM | POA: Insufficient documentation

## 2011-07-30 DIAGNOSIS — I200 Unstable angina: Secondary | ICD-10-CM | POA: Insufficient documentation

## 2011-07-30 DIAGNOSIS — Z23 Encounter for immunization: Secondary | ICD-10-CM | POA: Insufficient documentation

## 2011-07-30 DIAGNOSIS — I10 Essential (primary) hypertension: Secondary | ICD-10-CM | POA: Insufficient documentation

## 2011-07-30 DIAGNOSIS — Z9861 Coronary angioplasty status: Secondary | ICD-10-CM | POA: Insufficient documentation

## 2011-07-30 DIAGNOSIS — R9439 Abnormal result of other cardiovascular function study: Secondary | ICD-10-CM | POA: Insufficient documentation

## 2011-07-30 DIAGNOSIS — R0609 Other forms of dyspnea: Secondary | ICD-10-CM | POA: Insufficient documentation

## 2011-07-30 DIAGNOSIS — E785 Hyperlipidemia, unspecified: Secondary | ICD-10-CM | POA: Insufficient documentation

## 2011-07-30 DIAGNOSIS — I2510 Atherosclerotic heart disease of native coronary artery without angina pectoris: Secondary | ICD-10-CM

## 2011-07-30 HISTORY — PX: CORONARY ANGIOPLASTY: SHX604

## 2011-07-30 HISTORY — PX: LEFT HEART CATHETERIZATION WITH CORONARY ANGIOGRAM: SHX5451

## 2011-07-30 HISTORY — PX: PERCUTANEOUS CORONARY STENT INTERVENTION (PCI-S): SHX5485

## 2011-07-30 HISTORY — DX: Acute myocardial infarction, unspecified: I21.9

## 2011-07-30 SURGERY — LEFT HEART CATHETERIZATION WITH CORONARY ANGIOGRAM
Anesthesia: LOCAL

## 2011-07-30 MED ORDER — SODIUM CHLORIDE 0.9 % IV SOLN
INTRAVENOUS | Status: DC
  Administered 2011-07-30: 1000 mL via INTRAVENOUS

## 2011-07-30 MED ORDER — DIAZEPAM 5 MG PO TABS
5.0000 mg | ORAL_TABLET | ORAL | Status: AC
  Administered 2011-07-30: 5 mg via ORAL
  Filled 2011-07-30: qty 1

## 2011-07-30 MED ORDER — HEPARIN (PORCINE) IN NACL 2-0.9 UNIT/ML-% IJ SOLN
INTRAMUSCULAR | Status: AC
  Filled 2011-07-30: qty 2000

## 2011-07-30 MED ORDER — ASPIRIN 81 MG PO CHEW
324.0000 mg | CHEWABLE_TABLET | ORAL | Status: AC
  Administered 2011-07-30: 324 mg via ORAL
  Filled 2011-07-30: qty 4

## 2011-07-30 MED ORDER — SODIUM CHLORIDE 0.9 % IJ SOLN
3.0000 mL | INTRAMUSCULAR | Status: DC | PRN
Start: 2011-07-30 — End: 2011-07-30

## 2011-07-30 MED ORDER — SODIUM CHLORIDE 0.9 % IJ SOLN: 3.0000 mL | Freq: Two times a day (BID) | INTRAMUSCULAR | Status: DC

## 2011-07-30 MED ORDER — MIDAZOLAM HCL 2 MG/2ML IJ SOLN
INTRAMUSCULAR | Status: AC
  Filled 2011-07-30: qty 2

## 2011-07-30 MED ORDER — HYDRALAZINE HCL 20 MG/ML IJ SOLN
10.0000 mg | Freq: Once | INTRAMUSCULAR | Status: AC
  Administered 2011-07-30: 10 mg via INTRAVENOUS
  Filled 2011-07-30: qty 0.5

## 2011-07-30 MED ORDER — SODIUM CHLORIDE 0.9 % IV SOLN: 250.0000 mL | INTRAVENOUS | Status: DC | PRN

## 2011-07-30 MED ORDER — BIVALIRUDIN 250 MG IV SOLR
INTRAVENOUS | Status: AC
  Filled 2011-07-30: qty 250

## 2011-07-30 MED ORDER — ONDANSETRON HCL 4 MG/2ML IJ SOLN: 4.0000 mg | Freq: Four times a day (QID) | INTRAMUSCULAR | Status: DC | PRN

## 2011-07-30 MED ORDER — CLOPIDOGREL BISULFATE 75 MG PO TABS
75.0000 mg | ORAL_TABLET | Freq: Every day | ORAL | Status: DC
  Administered 2011-07-31: 75 mg via ORAL
  Filled 2011-07-30: qty 1

## 2011-07-30 MED ORDER — ACETAMINOPHEN 325 MG PO TABS
650.0000 mg | ORAL_TABLET | ORAL | Status: DC | PRN
  Filled 2011-07-30: qty 2

## 2011-07-30 MED ORDER — HEPARIN SODIUM (PORCINE) 1000 UNIT/ML IJ SOLN
INTRAMUSCULAR | Status: AC
  Filled 2011-07-30: qty 1

## 2011-07-30 MED ORDER — FENTANYL CITRATE 0.05 MG/ML IJ SOLN
INTRAMUSCULAR | Status: AC
  Filled 2011-07-30: qty 2

## 2011-07-30 MED ORDER — VERAPAMIL HCL 2.5 MG/ML IV SOLN
INTRAVENOUS | Status: AC
  Filled 2011-07-30: qty 2

## 2011-07-30 MED ORDER — NITROGLYCERIN 0.4 MG SL SUBL: 0.4000 mg | SUBLINGUAL_TABLET | SUBLINGUAL | Status: DC | PRN

## 2011-07-30 MED ORDER — NITROGLYCERIN 0.2 MG/ML ON CALL CATH LAB
INTRAVENOUS | Status: AC
  Filled 2011-07-30: qty 1

## 2011-07-30 MED ORDER — EZETIMIBE 10 MG PO TABS
10.0000 mg | ORAL_TABLET | Freq: Every day | ORAL | Status: DC
  Administered 2011-07-31: 10 mg via ORAL
  Filled 2011-07-30 (×3): qty 1

## 2011-07-30 MED ORDER — LIDOCAINE HCL (PF) 1 % IJ SOLN
INTRAMUSCULAR | Status: AC
  Filled 2011-07-30: qty 30

## 2011-07-30 MED ORDER — INFLUENZA VIRUS VACC SPLIT PF IM SUSP
0.5000 mL | INTRAMUSCULAR | Status: AC
  Administered 2011-07-31: 0.5 mL via INTRAMUSCULAR
  Filled 2011-07-30: qty 0.5

## 2011-07-30 MED ORDER — FOLIC ACID 1 MG PO TABS
1.0000 mg | ORAL_TABLET | Freq: Every day | ORAL | Status: DC
  Administered 2011-07-31: 1 mg via ORAL
  Filled 2011-07-30 (×3): qty 1

## 2011-07-30 MED ORDER — CLOPIDOGREL BISULFATE 300 MG PO TABS
ORAL_TABLET | ORAL | Status: AC
  Filled 2011-07-30: qty 2

## 2011-07-30 MED ORDER — MORPHINE SULFATE 2 MG/ML IJ SOLN
2.0000 mg | INTRAMUSCULAR | Status: DC | PRN
  Administered 2011-07-30: 2 mg via INTRAVENOUS
  Filled 2011-07-30: qty 1

## 2011-07-30 MED ORDER — ROSUVASTATIN CALCIUM 40 MG PO TABS
40.0000 mg | ORAL_TABLET | Freq: Every day | ORAL | Status: DC
  Administered 2011-07-30: 40 mg via ORAL
  Filled 2011-07-30 (×3): qty 1

## 2011-07-30 MED ORDER — FAMOTIDINE IN NACL 20-0.9 MG/50ML-% IV SOLN
INTRAVENOUS | Status: AC
  Filled 2011-07-30: qty 50

## 2011-07-30 MED ORDER — ATENOLOL 25 MG PO TABS
25.0000 mg | ORAL_TABLET | Freq: Every day | ORAL | Status: DC
  Administered 2011-07-31: 25 mg via ORAL
  Filled 2011-07-30 (×3): qty 1

## 2011-07-30 MED ORDER — MORPHINE SULFATE 2 MG/ML IJ SOLN
INTRAMUSCULAR | Status: AC
  Administered 2011-07-30: 2 mg via INTRAVENOUS
  Filled 2011-07-30: qty 1

## 2011-07-30 MED ORDER — PNEUMOCOCCAL VAC POLYVALENT 25 MCG/0.5ML IJ INJ
0.5000 mL | INJECTION | INTRAMUSCULAR | Status: AC
  Administered 2011-07-31: 0.5 mL via INTRAMUSCULAR
  Filled 2011-07-30: qty 0.5

## 2011-07-30 MED ORDER — HYDROCODONE-ACETAMINOPHEN 5-325 MG PO TABS
1.0000 | ORAL_TABLET | Freq: Four times a day (QID) | ORAL | Status: DC | PRN
  Administered 2011-07-30 (×2): 1 via ORAL
  Filled 2011-07-30 (×2): qty 1

## 2011-07-30 MED ORDER — SODIUM CHLORIDE 0.9 % IV SOLN
INTRAVENOUS | Status: AC
  Administered 2011-07-30: 11:00:00 via INTRAVENOUS

## 2011-07-30 MED ORDER — ASPIRIN 81 MG PO CHEW: 81.0000 mg | CHEWABLE_TABLET | Freq: Every day | ORAL | Status: DC

## 2011-07-30 NOTE — H&P (View-Only) (Signed)
 History of Present Illness:Luis [name] is a pleasant 76-year-old white male here today for follow up. He has a past medical history significant for coronary artery disease status post placement of a bare-metal stent in the right coronary artery in 2001 with a staged intervention to the circumflex coronary artery with placement of a bare-metal stent in the mid circumflex vessel and balloon angioplasty of the ostium of the first obtuse marginal coronary artery. He has a residual 75% stenosis in the right coronary artery as well as nonobstructive disease in the other vessels that has been managed medically over the last several years. Carotid artery dopplers 06/07/10 with mild bilateral disease. I saw him 07/17/11 for routine f/u and he c/o dyspnea with exertion. He continues to work full-time and exercises several days per week. He has experienced no exertional chest pain. His dyspnea is new over the last few months.  He works out with a trainer 3 days per week and walks 4 days per week. He denied palpitations, any awareness of irregularity of his heart rhythm, orthopnea, PND, or lower extremity edema.   I arranged a stress myoview on 07/25/11 which showed inferior wall ischemia. LVEF was not calculated since the study was not gated. I asked him to come in today to discuss his stress test. He still has dyspnea with exertion. No other new complaints.   Most recent lipids are: total chol: 132 HDL: 49 LDL: 73   Primary Care Physician:  Dr. Hopper  Last Lipid Profile:  Past Medical History  Diagnosis Date  . Unspecified essential hypertension   . HYPERLIPIDEMIA   . CORONARY ARTERY DISEASE   . COLONIC POLYPS, HX OF   . DJD (degenerative joint disease)     Past Surgical History  Procedure Date  . Cath 2001: stents  patent 05/2000   . Cataract surgery bilat 2007   . Laser surgery after lens implant os   . Inguinal hernia repair 12/08, dr weatherly     Current Outpatient Prescriptions    Medication Sig Dispense Refill  . aspirin 81 MG tablet Take 81 mg by mouth daily.        . atenolol (TENORMIN) 25 MG tablet Take 1 tablet (25 mg total) by mouth daily.  30 tablet  6  . atorvastatin (LIPITOR) 80 MG tablet Take 80 mg by mouth daily.        . ezetimibe (ZETIA) 10 MG tablet Take 10 mg by mouth daily.        . folic acid (FOLVITE) 1 MG tablet TAKE 1 TABLET ONCE DAILY.  100 tablet  1  . nitroGLYCERIN (NITROSTAT) 0.4 MG SL tablet Place 0.4 mg under the tongue every 5 (five) minutes as needed.          No Known Allergies  History   Social History  . Marital Status: Married    Spouse Name: N/A    Number of Children: N/A  . Years of Education: N/A   Occupational History  . Not on file.   Social History Main Topics  . Smoking status: Former Smoker    Types: Cigarettes    Quit date: 06/12/1999  . Smokeless tobacco: Not on file  . Alcohol Use: No  . Drug Use: No  . Sexually Active: Not on file   Other Topics Concern  . Not on file   Social History Narrative  . No narrative on file    Family History  Problem Relation Age of Onset  . Colon cancer        Review of Systems:  As stated in the HPI and otherwise negative.   BP 149/89  Pulse 72  Ht 6' 1" (1.854 m)  Wt 207 lb (93.895 kg)  BMI 27.31 kg/m2  Physical Examination: General: Well developed, well nourished, NAD HEENT: OP clear, mucus membranes moist SKIN: warm, dry. No rashes. Neuro: No focal deficits Musculoskeletal: Muscle strength 5/5 all ext Psychiatric: Mood and affect normal Neck: No JVD, no carotid bruits, no thyromegaly, no lymphadenopathy. Lungs:Clear bilaterally, no wheezes, rhonci, crackles Cardiovascular: Regular rate and rhythm. No murmurs, gallops or rubs. Abdomen:Soft. Bowel sounds present. Non-tender.  Extremities: No lower extremity edema. Pulses are 2 + in the bilateral DP/PT.  Stress myoview: 07/25/11:  Stress Procedure: The patient exercised for nine minutes on the treadmill  utilizing the Bruce protocol. The patient stopped due to fatigue and denied any chest pain. There were nonspecific ST-T wave changes with occasional PVC's and PAC's. He had a hypertensive response to exercise, 219/92. Technetium 99m Tetrofosmin was injected at peak exercise and myocardial perfusion imaging was performed after a brief delay.  Stress ECG: 1 mm upsloping ST segment depression in lateral leads that became more horizontal in recovery  QPS  Raw Data Images: Patient motion noted.  Stress Images: There is decreased uptake in the inferior wall.  Rest Images: Normal homogeneous uptake in all areas of the myocardium.  Subtraction (SDS): SDS 6 abnormal in inferoseptum  Transient Ischemic Dilatation (Normal <1.22): 0.91  Lung/Heart Ratio (Normal <0.45): 0.33  Quantitative Gated Spect Images  QGS EDV: n/a  QGS ESV: n/a  QGS cine images: Study not gated  QGS EF: Study not gated  Impression  Exercise Capacity: Fair exercise capacity.  BP Response: Hypertensive blood pressure response.  Clinical Symptoms: No chest pain.  ECG Impression: See above abnormal  Comparison with Prior Nuclear Study: No images to compare  Overall Impression: Mild inferior wall ischemia at mid and basal level. Borderline abnormal ECG Hypertensive response to exercise   

## 2011-07-30 NOTE — Op Note (Signed)
Cardiac Catheterization Operative Report  Luis [name] [id number] 2/18/201310:34 AM Marga Melnick, MD, MD  Procedure Performed:  1. Left Heart Catheterization 2. Selective Coronary Angiography 3. Left ventricular angiogram 4. PTCA/DES x 1 proximal RCA 5. PTCA/DES x 1 distal RCA 6. PTCA/balloon only mid Circumflex  Operator: Verne Carrow, MD  Arterial access site:  Right radial artery.   Indication: Dyspnea with exertion, abnormal stress myoview.                                  Procedure Details: The risks, benefits, complications, treatment options, and expected outcomes were discussed with the patient. The patient and/or family concurred with the proposed plan, giving informed consent. The patient was brought to the cath lab after IV hydration was begun and oral premedication was given. The patient was further sedated with Versed and Fentanyl. The right wrist was assessed with an Allens test which was positive. The right wrist was prepped and draped in a sterile fashion. 1% lidocaine was used for local anesthesia. Using the modified Seldinger access technique, a 5 French sheath was placed in the right radial artery. 3 mg Verapamil was given through the sheath. 4500 units IV heparin was given. Standard diagnostic catheters were used to perform selective coronary angiography. A pigtail catheter was used to perform a left ventricular angiogram. We elected to proceed to PCI of the RCA. The sheath was exchanged for a 6 French sheath. The patient was given a bolus of Angiomax and a drip was started. Plavix 600 mg po x 1 given. I then used a JR-4 diagnostic catheter to engage the RCA. When the ACT was >200, I passed a Cougar IC wire down the RCA. A 2. 5 x 12 mm balloon was used to pre-dilate the stenosis. A 3.5 x 20 mm Promus Element DES was deployed in the proximal vessel. A 3.75 x 12 mm Osmond balloon was used to post-dilate the stent. The distal stenosis did not improve with NTG IC  after opening the proximal lesion. I then used a 2.5 x 12 mm balloon to pre-dilate the distal stenosis. A 2.5 x 20 mm Promus Element DES was deployed in the distal vessel. A 2.75 x 15 mm Ramer balloon was used to post-dilate the distal stent. There was an excellent result.   I then turned my attention to the small caliber distal Circumflex stenosis. A XB 3.0 guide was used to engage the left main artery. The Cougar wire was advanced down the vessel. The wire passed easily. I was unable to deliver a balloon beyond the stenosis. I used a 2.0 x 12 mm balloon, then a 1.5 x 12 mm balloon then a 1.20 x 12 mm balloon. I inflated the 1.20 x 12 mm balloon on the proximal edge of the stenosis but could not change the lesion. There was good flow down the vessel. This stenosis is chronic. Given the small size of the vessel, I did not think further attempts were necessary. The guide was removed.   The sheath was removed from the right radial artery and a Terumo hemostasis band was applied at the arteriotomy site on the right wrist.    There were no immediate complications. The patient was taken to the recovery area in stable condition.   Hemodynamic Findings: Central aortic pressure: 121/64 Left ventricular pressure: 121/7/16  Angiographic Findings:  Left main:  No disease.   Left Anterior Descending Artery: Mild  20% plaque in the proximal, mid and distal LAD. Small to moderate sized diagonal with plaque disease.   Circumflex Artery: Patent stent proximal vessel with mild in-stent restenosis. OM1 and OM2 are patent with mild plaque disease. The distal Circumflex becomes small caliber and has a 99% stenosis. The vessel is approximately 1.75-2.0 mm in diameter. Beyond this stenosis is another 70% stenosis where the vessel is very small, less than 1.25 mm in diameter.   Right Coronary Artery: Large dominant vessel with proximal stent with 99% stenosis within the mid portion of the stent. The mid vessel has a 40%  stenosis. The distal vessel has serial 90% lesions.   Left Ventricular Angiogram: LVEF 55%, mild HK inferior wall.    Impression: 1. Double vessel CAD 2. Successful PTCA/DES x 1 proximal RCA 3. Successful PTCA/DES x 1 distal RCA 4. PTCA with balloon only distal Circumflex artery 5. Normal LV systolic function  Recommendations:  Will need ASA and Plavix for at least one year.        Complications:  None. The patient tolerated the procedure well.

## 2011-07-30 NOTE — Interval H&P Note (Signed)
History and Physical Interval Note:  07/30/2011 8:42 AM  Luis [name]  has presented today for cardiac cath and possible PCI with the diagnosis of ABNORMAL MYOVIEW.  The various methods of treatment have been discussed with the patient and family. After consideration of risks, benefits and other options for treatment, the patient has consented to  Procedure(s) (LRB): LEFT HEART CATHETERIZATION WITH CORONARY ANGIOGRAM (N/A) as a surgical intervention .  The patients' history has been reviewed, patient examined, no change in status, stable for surgery.  I have reviewed the patients' chart and labs.  Questions were answered to the patient's satisfaction.     Kylina Vultaggio

## 2011-07-31 ENCOUNTER — Other Ambulatory Visit: Payer: Self-pay

## 2011-07-31 DIAGNOSIS — I2510 Atherosclerotic heart disease of native coronary artery without angina pectoris: Secondary | ICD-10-CM

## 2011-07-31 LAB — CBC
HCT: 40.3 % (ref 39.0–52.0)
MCH: 30.7 pg (ref 26.0–34.0)
MCHC: 34.2 g/dL (ref 30.0–36.0)
MCV: 89.6 fL (ref 78.0–100.0)
RDW: 13.5 % (ref 11.5–15.5)
WBC: 6.2 10*3/uL (ref 4.0–10.5)

## 2011-07-31 LAB — BASIC METABOLIC PANEL
BUN: 13 mg/dL (ref 6–23)
Calcium: 9.4 mg/dL (ref 8.4–10.5)
Chloride: 107 mEq/L (ref 96–112)
Creatinine, Ser: 0.93 mg/dL (ref 0.50–1.35)
GFR calc Af Amer: >90 mL/min (ref >90)

## 2011-07-31 MED ORDER — CLOPIDOGREL BISULFATE 75 MG PO TABS: 75.0000 mg | ORAL_TABLET | Freq: Every day | ORAL | Status: DC

## 2011-07-31 MED FILL — Dextrose Inj 5%: INTRAVENOUS | Qty: 50 | Status: AC

## 2011-07-31 NOTE — Progress Notes (Signed)
CARDIAC REHAB PHASE I   PRE:  Rate/Rhythm: 75 SR  BP:  Supine:   Sitting: 132/73  Standing:    SaO2:   MODE:  Ambulation: 680 ft   POST:  Rate/Rhythem: 89 SR  BP:  Supine:   Sitting: 140/70  Standing:    SaO2:  0850- 0935  Tolerated ambulation well without c/o of cp or SOB. VS stable. Completed discharge education with pt. He declines Outpt. CRP, not interested.  Luis [name]

## 2011-07-31 NOTE — Discharge Instructions (Signed)
**  PLEASE REMEMBER TO BRING ALL OF YOUR MEDICATIONS TO EACH OF YOUR FOLLOW-UP OFFICE VISITS. ° °NO HEAVY LIFTING OR SEXUAL ACTIVITY X 7 DAYS. °NO DRIVING X 3-5 DAYS. °NO SOAKING BATHS, HOT TUBS, POOLS, ETC., X 7 DAYS.  ° °Groin Site Care °Refer to this sheet in the next few weeks. These instructions provide you with information on caring for yourself after your procedure. Your caregiver may also give you more specific instructions. Your treatment has been planned according to current medical practices, but problems sometimes occur. Call your caregiver if you have any problems or questions after your procedure. °HOME CARE INSTRUCTIONS °· You may shower 24 hours after the procedure. Remove the bandage (dressing) and gently wash the site with plain soap and water. Gently pat the site dry.  °· Do not apply powder or lotion to the site.  °· Do not sit in a bathtub, swimming pool, or whirlpool for 5 to 7 days.  °· No bending, squatting, or lifting anything over 10 pounds (4.5 kg) as directed by your caregiver.  °· Inspect the site at least twice daily.  °· Do not drive home if you are discharged the same day of the procedure. Have someone else drive you.  °· You may drive 24 hours after the procedure unless otherwise instructed by your caregiver.  °What to expect: °· Any bruising will usually fade within 1 to 2 weeks.  °· Blood that collects in the tissue (hematoma) may be painful to the touch. It should usually decrease in size and tenderness within 1 to 2 weeks.  °SEEK IMMEDIATE MEDICAL CARE IF: °· You have unusual pain at the groin site or down the affected leg.  °· You have redness, warmth, swelling, or pain at the groin site.  °· You have drainage (other than a small amount of blood on the dressing).  °· You have chills.  °· You have a fever or persistent symptoms for more than 72 hours.  °· You have a fever and your symptoms suddenly get worse.  °· Your leg becomes pale, cool, tingly, or numb.  °You have heavy  bleeding from the site. Hold pressure on the site.  °

## 2011-07-31 NOTE — Discharge Summary (Signed)
Patient ID: Luis [name],  MRN: [id number], DOB/AGE: Jan 18, 1935 77 y.o.  Admit date: 07/30/2011 Discharge date: 07/31/2011  Primary Care Provider: Marga Melnick, MD Primary Cardiologist: C. McAlhany  Discharge Diagnoses Principal Problem:  *Unstable angina Active Problems:  HYPERLIPIDEMIA  Unspecified essential hypertension  CORONARY ARTERY DISEASE   Allergies No Known Allergies  Procedures  07/30/2011 left heart cardiac catheterization and her percutaneous coronary intervention  Angiographic Findings:  Left main:  No disease.    Left Anterior Descending Artery: Mild 20% plaque in the proximal, mid and distal LAD. Small to moderate sized diagonal with plaque disease.    Circumflex Artery: Patent stent proximal vessel with mild in-stent restenosis. OM1 and OM2 are patent with mild plaque disease. The distal Circumflex becomes small caliber and has a 99% stenosis. The vessel is approximately 1.75-2.0 mm in diameter. Beyond this stenosis is another 70% stenosis where the vessel is very small, less than 1.25 mm in diameter.  **Distal LCX was treated with PTCA**  Right Coronary Artery: Large dominant vessel with proximal stent with 99% stenosis within the mid portion of the stent (**Treated with 3.5 x 20 mm Promus element DES**). The mid vessel has a 40% stenosis. The distal vessel has serial 90% lesions (**Treated with 2.5 x 20mm Promus Element DES**).   Left Ventricular Angiogram: LVEF 55%, mild HK inferior wall   History of Present Illness  77 year old male with prior history of coronary artery disease who was recently seen in clinic by Dr. Clifton James secondary to progressive dyspnea. Stress testing was subsequently performed and revealed mild inferior wall ischemia. As result, patient was scheduled for diagnostic catheterization.  Hospital Course  Patient presented to the cardiac catheterization laboratory on 07/30/2011 and underwent diagnostic catheterization revealing  in-stent restenosis within the right coronary artery along with distal right coronary artery disease. Patient was also found to have distal, small vessel left circumflex disease. The right coronary artery was subsequently treated with 2 Promus element drug-eluting stents while the distal circumflex was treated with balloon angioplasty.  Patient tolerated procedure well and post procedure has been ambulating without recurrent symptoms or limitations. He'll be discharged home today in good condition.  Discharge Vitals Blood pressure 132/73, pulse 76, temperature 98.5 F (36.9 C), temperature source Oral, resp. rate 18, height 6' (1.829 m), weight 198 lb (89.812 kg), SpO2 98.00%.  Filed Weights   07/30/11 0709  Weight: 198 lb (89.812 kg)    Labs  CBC  Basename 07/31/11 0424  WBC 6.2  NEUTROABS --  HGB 13.8  HCT 40.3  MCV 89.6  PLT 191   Basic Metabolic Panel  Basename 07/31/11 0424  NA 141  K 3.6  CL 107  CO2 24  GLUCOSE 102*  BUN 13  CREATININE 0.93  CALCIUM 9.4  MG --  PHOS --   Disposition  Pt is being discharged home today in good condition.  Follow-up Plans & Appointments  Follow-up Information    Follow up with [facility], MD on 08/28/2011. (2:30 PM)    Contact information:   [facility] [address] [place] [address] [phone]       Follow up with Marga Melnick, MD on 09/05/2011. (10:30 AM)    Contact information:   [phone] [facility] [address] [place] [address] [phone]          Discharge Medications  Medication List  As of 07/31/2011 11:48 AM   TAKE these medications  aspirin 81 MG tablet   Take 81 mg by mouth daily.      atenolol 25 MG tablet   Commonly known as: TENORMIN   Take 1 tablet (25 mg total) by mouth daily.      atorvastatin 80 MG tablet   Commonly known as: LIPITOR   Take 80 mg by mouth daily.      clopidogrel 75 MG tablet   Commonly  known as: PLAVIX   Take 1 tablet (75 mg total) by mouth daily with breakfast.      ezetimibe 10 MG tablet   Commonly known as: ZETIA   Take 10 mg by mouth daily.      folic acid 1 MG tablet   Commonly known as: FOLVITE   Take 1 mg by mouth daily.      nitroGLYCERIN 0.4 MG SL tablet   Commonly known as: NITROSTAT   Place 0.4 mg under the tongue every 5 (five) minutes as needed. For chest pain            Outstanding Labs/Studies  None  Duration of Discharge Encounter   Greater than 30 minutes including physician time.  Signed, Nicolasa Ducking NP 07/31/2011, 11:48 AM

## 2011-07-31 NOTE — Progress Notes (Signed)
    SUBJECTIVE: No issues overnight. No chest pain or SOB.   BP 126/70  Pulse 75  Temp(Src) 97.9 F (36.6 C) (Oral)  Resp 18  Ht 6' (1.829 m)  Wt 198 lb (89.812 kg)  BMI 26.85 kg/m2  SpO2 99%  Intake/Output Summary (Last 24 hours) at 07/31/11 0701 Last data filed at 07/30/11 2000  Gross per 24 hour  Intake    390 ml  Output      0 ml  Net    390 ml    PHYSICAL EXAM General: Well developed, well nourished, in no acute distress. Alert and oriented x 3.  Psych:  Good affect, responds appropriately Neck: No JVD. No masses noted.  Lungs: Clear bilaterally with no wheezes or rhonci noted.  Heart: RRR with no murmurs noted. Abdomen: Bowel sounds are present. Soft, non-tender.  Extremities: No lower extremity edema. Right wrist cath site ok.   LABS: Basic Metabolic Panel:  Basename 07/31/11 0424  NA 141  K 3.6  CL 107  CO2 24  GLUCOSE 102*  BUN 13  CREATININE 0.93  CALCIUM 9.4  MG --  PHOS --   CBC:  Basename 07/31/11 0424  WBC 6.2  NEUTROABS --  HGB 13.8  HCT 40.3  MCV 89.6  PLT 191    Current Meds:    . aspirin  324 mg Oral Pre-Cath  . atenolol  25 mg Oral Daily  . bivalirudin      . clopidogrel      . clopidogrel  75 mg Oral Q breakfast  . diazepam  5 mg Oral On Call  . ezetimibe  10 mg Oral Daily  . famotidine      . fentaNYL      . folic acid  1 mg Oral Daily  . heparin      . heparin      . hydrALAZINE  10 mg Intravenous Once  . influenza  inactive virus vaccine  0.5 mL Intramuscular Tomorrow-1000  . lidocaine      . midazolam      . midazolam      . midazolam      . morphine      . nitroGLYCERIN      . pneumococcal 23 valent vaccine  0.5 mL Intramuscular Tomorrow-1000  . rosuvastatin  40 mg Oral q1800  . verapamil      . DISCONTD: aspirin  81 mg Oral Daily  . DISCONTD: sodium chloride  3 mL Intravenous Q12H     ASSESSMENT AND PLAN:  1. CAD: Pt admitted yesterday after outpatient cath. Abnormal stress myoview in office with  inferior ischemia. Cath showed a 99% proximal RCA stenosis and 90% distal RCA stenosis. Now s/p DES x 1 proximal RCA and DES x 1 distal RCA. I attempted PTCA of the small distal Circumflex but unable cross the stenosis with a balloon. He will need ASA and Plavix for at least one year. Continue beta blocker and statin.   2. Dispo: d/c home today. He has f/u planned with me.     Vadis Slabach  2/19/20137:01 AM

## 2011-08-01 NOTE — Discharge Summary (Signed)
Full note written on am of 07/31/11. cdm

## 2011-08-28 ENCOUNTER — Encounter: Payer: Self-pay | Admitting: Cardiovascular Disease

## 2011-08-28 ENCOUNTER — Ambulatory Visit (INDEPENDENT_AMBULATORY_CARE_PROVIDER_SITE_OTHER): Payer: Medicare Other | Admitting: Cardiovascular Disease

## 2011-08-28 VITALS — BP 122/72 | HR 63 | Ht 72.0 in | Wt 202.0 lb

## 2011-08-28 DIAGNOSIS — I2510 Atherosclerotic heart disease of native coronary artery without angina pectoris: Secondary | ICD-10-CM

## 2011-08-28 NOTE — Progress Notes (Signed)
History of Present Illness: Luis [name] is a pleasant 77 year old white male here today for follow up. He has a past medical history significant for coronary artery disease status post placement of a bare-metal stent in the right coronary artery in 2001 with a staged intervention to the circumflex coronary artery with placement of a bare-metal stent in the mid circumflex vessel and balloon angioplasty of the ostium of the first obtuse marginal coronary artery. He has a residual 75% stenosis in the right coronary artery as well as nonobstructive disease in the other vessels that has been managed medically over the last several years. Carotid artery dopplers 06/07/10 with mild bilateral disease. I saw him 07/17/11 for routine f/u and he c/o dyspnea with exertion. He continues to work full-time and exercises several days per week. He has experienced no exertional chest pain. His dyspnea is new over the last few months. He works out with a Psychologist, educational 3 days per week and walks 4 days per week. He denied palpitations, any awareness of irregularity of his heart rhythm, orthopnea, PND, or lower extremity edema. I arranged a stress myoview on 07/25/11 which showed inferior wall ischemia. LVEF was not calculated since the study was not gated. I arranged a cardiac cath on 07/30/11 which showed severe disease in the proximal and distal RCA treated with two drug eluting stents (non-overlapping) and severe disease in the small distal Circumflex treated with balloon angioplasty only.   He is here today for follow up. He is doing well. No chest pain or SOB.    Primary Care Physician: Alwyn Ren  Last Lipid Profile: January 2013, well controlled.   Past Medical History  Diagnosis Date  . Unspecified essential hypertension   . HYPERLIPIDEMIA   . CORONARY ARTERY DISEASE   . COLONIC POLYPS, HX OF   . DJD (degenerative joint disease)   . Myocardial infarction 08/1999  . Angina     Past Surgical History  Procedure Date  .  Coronary angioplasty with stent placement 2001; 07/30/11;    "2+3"  . Cardiac catheterization ~ 2002  . Coronary angioplasty 07/30/11  . Cataract extraction w/ intraocular lens  implant, bilateral ~ 2007  . Retinal detachment surgery 05/2008; 06/2008; 07/2008    left; "all 3 surgeries have failed"  . Inguinal hernia repair 2005    left    Current Outpatient Prescriptions  Medication Sig Dispense Refill  . aspirin 81 MG tablet Take 81 mg by mouth daily.        Marland Kitchen atenolol (TENORMIN) 25 MG tablet Take 1 tablet (25 mg total) by mouth daily.  30 tablet  6  . atorvastatin (LIPITOR) 80 MG tablet Take 80 mg by mouth daily.        . clopidogrel (PLAVIX) 75 MG tablet Take 1 tablet (75 mg total) by mouth daily with breakfast.  30 tablet  6  . ezetimibe (ZETIA) 10 MG tablet Take 10 mg by mouth daily.        . folic acid (FOLVITE) 1 MG tablet Take 1 mg by mouth daily.      . nitroGLYCERIN (NITROSTAT) 0.4 MG SL tablet Place 0.4 mg under the tongue every 5 (five) minutes as needed. For chest pain      . PATADAY 0.2 % SOLN As needed for allergies        No Known Allergies  History   Social History  . Marital Status: Married    Spouse Name: N/A    Number of Children: N/A  .  Years of Education: N/A   Occupational History  . Not on file.   Social History Main Topics  . Smoking status: Former Smoker -- 0.5 packs/day for 40 years    Types: Cigarettes    Quit date: 06/12/1999  . Smokeless tobacco: Never Used  . 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 Drug Use: No  . Sexually Active: Not Currently   Other Topics Concern  . Not on file   Social History Narrative  . No narrative on file    Family History  Problem Relation Age of Onset  . Colon cancer      Review of Systems:  As stated in the HPI and otherwise negative.   BP 122/72  Pulse 63  Ht 6' (1.829 m)  Wt 202 lb (91.627 kg)  BMI 27.40 kg/m2  Physical Examination: General: Well developed, well  nourished, NAD HEENT: OP clear, mucus membranes moist SKIN: warm, dry. No rashes. Neuro: No focal deficits Musculoskeletal: Muscle strength 5/5 all ext Psychiatric: Mood and affect normal Neck: No JVD, no carotid bruits, no thyromegaly, no lymphadenopathy. Lungs:Clear bilaterally, no wheezes, rhonci, crackles Cardiovascular: Regular rate and rhythm. No murmurs, gallops or rubs. Abdomen:Soft. Bowel sounds present. Non-tender.  Extremities: No lower extremity edema. Pulses are 2 + in the bilateral DP/PT.  EKG:

## 2011-08-28 NOTE — Assessment & Plan Note (Signed)
Stable post PTCA/DES x 2 RCA, balloon angioplasty distal Circumflex. He will need to be on ASA and pLavix for at least one year. Continue other meds.

## 2011-08-28 NOTE — Patient Instructions (Signed)
Your physician wants you to follow-up in: 6 months  You will receive a reminder letter in the mail two months in advance. If you don't receive a letter, please call our office to schedule the follow-up appointment.  Your physician recommends that you continue on your current medications as directed. Please refer to the Current Medication list given to you today.  

## 2011-09-05 ENCOUNTER — Ambulatory Visit (INDEPENDENT_AMBULATORY_CARE_PROVIDER_SITE_OTHER): Payer: Medicare Other | Admitting: Internal Medicine

## 2011-09-05 ENCOUNTER — Encounter: Payer: Self-pay | Admitting: Internal Medicine

## 2011-09-05 VITALS — BP 122/80 | HR 64 | Temp 97.6°F | Resp 12 | Ht 70.5 in | Wt 199.4 lb

## 2011-09-05 DIAGNOSIS — Z0000 Encounter for general adult medical examination without abnormal findings: Secondary | ICD-10-CM

## 2011-09-05 DIAGNOSIS — Z86010 Personal history of colonic polyps: Secondary | ICD-10-CM

## 2011-09-05 DIAGNOSIS — I2510 Atherosclerotic heart disease of native coronary artery without angina pectoris: Secondary | ICD-10-CM

## 2011-09-05 DIAGNOSIS — Z23 Encounter for immunization: Secondary | ICD-10-CM

## 2011-09-05 NOTE — Patient Instructions (Addendum)
Use Eucerin as a moisturizing agent  twice a day  for the drying.  Exercise at least 30-45 minutes a day,  3-4 days a week.  Eat a low-fat diet with lots of fruits and vegetables, up to 7-9 servings per day. Consume less than 40 grams of sugar per day from foods & drinks with High Fructose Corn Sugar as #1,2,3 or # 4 on label. Please complete & return  stool cards

## 2011-09-05 NOTE — Progress Notes (Signed)
Subjective:    Patient ID: Amauri Keefe, male    DOB: Jan 01, 1935, 77 y.o.   MRN: [id number]  HPI Medicare Wellness Visit:  The following psychosocial & medical history were reviewed as required by Medicare.   Social history: caffeine: 2 cups/ day , alcohol: quit 1993,  tobacco use : 2001  & exercise : walking 50 min 5X/ week.   Home & personal  safety / fall risk:no issues, activities of daily living: no limitations , seatbelt use : yes , and smoke alarm employment : yes.  Power of Attorney/Living Will status :in place  Vision ( as recorded per Nurse) & Hearing  evaluation :  Ophth exam by Dr Nile Riggs 2 weeks ago. See hearing exam. Orientation :oriented X 3 , memory & recall :good,  math testing:good,and mood & affect : normal . Depression / anxiety: denied Travel history : 2012 [place] , immunization status :Shingles needed , transfusion history: no, and preventive health surveillance ( colonoscopies, BMD , etc as per protocol/ [facility]): colonoscopy deferred due to Plavix, Dental care:  Every 6 mos . Chart reviewed &  Updated. Active issues reviewed & addressed.       Review of Systems Since the stenting to/18/13 he denies chest pain, palpitations, dyspnea, or claudication. He was due for a colonoscopy  but this has been deferred because of Plavix therapy following the stents. He denies abdominal pain, melena, rectal bleeding, or unexplained weight loss.  Labs 07/31/11 were reviewed. GFR was minimally reduced at 81 but creatinine was 0.93. CBC revealed no anemia.  Lipids were done 06/16/11. LDL was essentially at goal at 73 and HDL was 49 which is protective. TSH was therapeutic in January as well. PSA at that time was 1.32.   He denies significant muscle weakness; he does have decreased libido. His testosterone level was normal at 500.16 on 06/26/11.  He describes some epistaxis mainly in the Winter. He does have easy bruising. He denies hemoptysis or hematuria. Platelet count was 191,000 on  2/19     Objective:   Physical Exam Gen.: Thin but healthy and well-nourished in appearance. Alert, appropriate and cooperative throughout exam. Head: Normocephalic without obvious abnormalities;  no alopecia  Eyes: No corneal or conjunctival inflammation noted.OS pupil ectopic.  Vision grossly decreased OD; sees only  form & light OS . Ears: External  ear exam reveals no significant lesions or deformities. Canals clear .TMs normal. Hearing is grossly normal bilaterally. Nose: External nasal exam reveals no deformity or inflammation. Nasal mucosa are dry. No lesions or exudates noted.  Mouth: Oral mucosa and oropharynx reveal no lesions or exudates. Teeth in good repair; partials. Neck: No deformities, masses, or tenderness noted. Range of motion & Thyroid normal Lungs: Normal respiratory effort; chest expands symmetrically. Lungs are clear to auscultation without rales, wheezes, or increased work of breathing. Heart: Slow rate ; regular rhythm. Normal S1 and S2. No gallop, click, or rub. No murmur. Heart sounds distant Abdomen: Bowel sounds normal; abdomen soft and nontender. No masses, organomegaly or hernias noted. Genitalia: normal genitalia. No DRE ( see PSA)  Musculoskeletal/extremities: No deformity or scoliosis noted of  the thoracic or lumbar spine. No clubbing, cyanosis, edema, or deformity noted. Range of motion  normal .Tone & strength  normal.Joints normal. Nail health  Good. Crepitus of knees Vascular: Carotid, radial artery, dorsalis pedis and  posterior tibial pulses are full and equal. No bruits present. Neurologic: Alert and oriented x3. Deep tendon reflexes symmetrical and normal.          Skin: Intact without suspicious lesions or rashes.Minor bruising noted on dorsal hands Lymph: No cervical, axillary, or inguinal lymphadenopathy present. Psych: Mood and affect are normal. Normally interactive                                                                                          Assessment & Plan:  #1 Medicare Wellness Exam; criteria met ; data entered #2 Problem List reviewed ; Assessment/ Recommendations made #3 easy bruising and epistaxis in the context of Plavix therapy. Interventions discussed. He is not to stop the Plavix unless he has emergency surgery or Dr. Clifton James discontinues it. Plan: see Orders

## 2011-09-11 ENCOUNTER — Other Ambulatory Visit (INDEPENDENT_AMBULATORY_CARE_PROVIDER_SITE_OTHER): Payer: Medicare Other

## 2011-09-11 DIAGNOSIS — Z1289 Encounter for screening for malignant neoplasm of other sites: Secondary | ICD-10-CM

## 2011-10-16 ENCOUNTER — Other Ambulatory Visit: Payer: Self-pay | Admitting: Dermatology

## 2011-10-30 ENCOUNTER — Other Ambulatory Visit: Payer: Self-pay | Admitting: *Deleted

## 2011-10-30 MED ORDER — ATORVASTATIN CALCIUM 80 MG PO TABS: 80.0000 mg | ORAL_TABLET | Freq: Every day | ORAL | Status: DC

## 2011-10-30 NOTE — Telephone Encounter (Signed)
Fax Received. Refill Completed. Luis [name] (R.M.A)   

## 2011-12-20 ENCOUNTER — Telehealth: Payer: Self-pay | Admitting: Cardiovascular Disease

## 2011-12-20 NOTE — Telephone Encounter (Signed)
New Problem:    PAtient needs to have his knee scoped and the operating physician needs the patient to hold his clopidogrel (PLAVIX) 75 MG tablet 5 days prior.  Please call back.

## 2011-12-20 NOTE — Telephone Encounter (Signed)
Dr Casilda Carls is doing a scope on Mr Luis [name] and needs to know if his Plavix can be held 5 days prior to the surgery?  He wants Dr Clifton James to know that he really wants this done badly and they will not do it if he cannot stop his Plavix.  Date is pending until cleared to be off Plavix.

## 2011-12-20 NOTE — Telephone Encounter (Signed)
No. I cannot give him permission to hold Plavix. He had overlapping drug eluting stents in February 2013. Would not want him to hold until at least February 2014. Risk of stent thrombosis and death is too high. If colonoscopy is emergent, he would have to hold and accept risk of stent thrombosis. cdm

## 2011-12-21 NOTE — Telephone Encounter (Signed)
Spoke with pt and gave him information from Dr. Clifton James.  6 month follow up appt made with Dr. Clifton James for March 13, 2012

## 2011-12-27 ENCOUNTER — Other Ambulatory Visit: Payer: Self-pay | Admitting: Cardiovascular Disease

## 2011-12-27 MED ORDER — EZETIMIBE 10 MG PO TABS: 10.0000 mg | ORAL_TABLET | Freq: Every day | ORAL | Status: DC

## 2012-01-14 ENCOUNTER — Other Ambulatory Visit: Payer: Self-pay

## 2012-01-14 MED ORDER — ATENOLOL 25 MG PO TABS: 25.0000 mg | ORAL_TABLET | Freq: Every day | ORAL | Status: DC

## 2012-01-17 ENCOUNTER — Encounter: Payer: Self-pay | Admitting: Cardiovascular Disease

## 2012-02-18 ENCOUNTER — Other Ambulatory Visit: Payer: Self-pay | Admitting: Internal Medicine

## 2012-03-13 ENCOUNTER — Ambulatory Visit: Payer: Medicare Other | Admitting: Cardiovascular Disease

## 2012-03-27 ENCOUNTER — Encounter: Payer: Self-pay | Admitting: Cardiovascular Disease

## 2012-03-27 ENCOUNTER — Ambulatory Visit (INDEPENDENT_AMBULATORY_CARE_PROVIDER_SITE_OTHER): Payer: Medicare Other | Admitting: Cardiovascular Disease

## 2012-03-27 VITALS — BP 117/71 | HR 67 | Ht 72.0 in | Wt 201.0 lb

## 2012-03-27 DIAGNOSIS — I2510 Atherosclerotic heart disease of native coronary artery without angina pectoris: Secondary | ICD-10-CM

## 2012-03-27 NOTE — Progress Notes (Signed)
History of Present Illness: [name] is a pleasant 77 year old white male here today for follow up. He has a past medical history significant for coronary artery disease status post placement of a bare-metal stent in the right coronary artery in 2001 with a staged intervention to the circumflex coronary artery with placement of a bare-metal stent in the mid circumflex vessel and balloon angioplasty of the ostium of the first obtuse marginal coronary artery. Cardiac cath February 2013 with placement of two non-overlapping DES in the RCA and severe disease in the small distal Circumflex treated with balloon angioplasty only.  Carotid artery dopplers 06/07/10 with mild bilateral disease.  He is here today for follow up. He is doing well. No chest pain or SOB. He is back to exercising three days per week. Still working full time.   Primary Care Physician: Alwyn Ren  Last Lipid Profile:Lipid Panel     Component Value Date/Time   CHOL 132 06/26/2011 0813   TRIG 51.0 06/26/2011 0813   HDL 48.70 06/26/2011 0813   CHOLHDL 3 06/26/2011 0813   VLDL 10.2 06/26/2011 0813   LDLCALC 73 06/26/2011 0813     Past Medical History  Diagnosis Date  . Unspecified essential hypertension   . HYPERLIPIDEMIA   . CORONARY ARTERY DISEASE     Plavix as of 07/30/11; Dr Clifton James  . COLONIC POLYPS, HX OF     Dr Kinnie Scales  . DJD (degenerative joint disease)   . Myocardial infarction 08/1999  . Angina     Past Surgical History  Procedure Date  . Coronary angioplasty with stent placement 2001; 07/30/11;    "2+3"  . Cardiac catheterization ~ 2002  . Coronary angioplasty 07/30/11  . Cataract extraction w/ intraocular lens  implant, bilateral ~ 2007    bilaterally  . Retinal detachment surgery 05/2008; 06/2008; 07/2008    left; "all 3 surgeries have failed"  . Inguinal hernia repair 2005    left    Current Outpatient Prescriptions  Medication Sig Dispense Refill  . aspirin 81 MG tablet Take 81 mg by mouth daily.         Marland Kitchen atenolol (TENORMIN) 25 MG tablet Take 1 tablet (25 mg total) by mouth daily.  30 tablet  6  . atorvastatin (LIPITOR) 80 MG tablet Take 1 tablet (80 mg total) by mouth daily.  30 tablet  5  . clopidogrel (PLAVIX) 75 MG tablet Take 1 tablet (75 mg total) by mouth daily with breakfast.  30 tablet  6  . ezetimibe (ZETIA) 10 MG tablet Take 1 tablet (10 mg total) by mouth daily.  30 tablet  5  . folic acid (FOLVITE) 1 MG tablet TAKE 1 TABLET ONCE DAILY.  100 tablet  1  . nitroGLYCERIN (NITROSTAT) 0.4 MG SL tablet Place 0.4 mg under the tongue every 5 (five) minutes as needed. For chest pain      . PATADAY 0.2 % SOLN As needed for allergies        No Known Allergies  History   Social History  . Marital Status: Married    Spouse Name: N/A    Number of Children: N/A  . Years of Education: N/A   Occupational History  . Not on file.   Social History Main Topics  . Smoking status: Former Smoker -- 0.5 packs/day for 40 years    Types: Cigarettes    Quit date: 06/12/1999  . Smokeless tobacco: Never Used  . Alcohol Use: No     07/30/11 "recovering  alcoholic; since 07/20/1991"  . Drug Use: No  . Sexually Active: Not Currently   Other Topics Concern  . Not on file   Social History Narrative  . No narrative on file    Family History  Problem Relation Age of Onset  . Colon cancer Father     in 5s  . Prostate cancer Father   . Alcohol abuse Mother     Review of Systems:  As stated in the HPI and otherwise negative.   BP 117/71  Pulse 67  Ht 6' (1.829 m)  Wt 201 lb (91.173 kg)  BMI 27.26 kg/m2  Physical Examination: General: Well developed, well nourished, NAD HEENT: OP clear, mucus membranes moist SKIN: warm, dry. No rashes. Neuro: No focal deficits Musculoskeletal: Muscle strength 5/5 all ext Psychiatric: Mood and affect normal Neck: No JVD, no carotid bruits, no thyromegaly, no lymphadenopathy. Lungs:Clear bilaterally, no wheezes, rhonci, crackles Cardiovascular:  Regular rate and rhythm. No murmurs, gallops or rubs. Abdomen:Soft. Bowel sounds present. Non-tender.  Extremities: No lower extremity edema. Pulses are 2 + in the bilateral DP/PT.  Assessment and Plan:   1. CORONARY ARTERY DISEASE: Stable post PTCA/DES x 2 RCA, balloon angioplasty distal Circumflex in February 2013. He will need to be on ASA and Plavix until at least February 2014.  Continue other meds.

## 2012-03-27 NOTE — Patient Instructions (Addendum)
Your physician recommends that you schedule a follow-up appointment  On July 31, 2012 at 8:45.    Your physician recommends that you return for fasting lab work  The week prior to your appt with Dr. Clifton James in February 2014

## 2012-05-12 ENCOUNTER — Telehealth: Payer: Self-pay | Admitting: Internal Medicine

## 2012-05-12 NOTE — Telephone Encounter (Signed)
Per Dr.Hopper ok to close encounter if patient recommended to be seen at Urgent Care or ER. I called and left message on patient's cell (If he is in town) and was not seen please call to be seen by NP @ [place] location.

## 2012-05-12 NOTE — Telephone Encounter (Signed)
[facility] Triage Call Report Triage Record Num: [id number] Operator: Reino Bellis Patient Name: Luis [name] Call Date & Time: 05/10/2012 7:34:09PM Patient Phone: [phone] PCP: Marga Melnick Patient Gender: Male PCP Fax : [phone] Patient DOB: 1935/05/22 Practice Name: [facility] Reason for Call: Caller: Kacee/Patient; PCP: Marga Melnick; CB#: [phone]; Call regarding Sinus pain and congestion; Pt. calling about headache, facial pain, congestion, and productive cough with yellow colored sputum. Onset: 05/02/12. Afebrile. Pt. request that a Z-pack be called in because he is leaving to go out of town on 05/11/12. Per URI Protocol: moderate to severe headache unrelieved by nonprescription medication. MD on call notified. Per MD, no antibiotics will be called in/pt. needs to go to [facility] tonight for evaluation. Pt. made aware and verbalizes understanding. Protocol(s) Used: Upper Respiratory Infection (URI) Recommended Outcome per Protocol: Call Provider Immediately Reason for Outcome: Moderate to severe headache unrelieved by nonprescription medication Care Advice: ~ IMMEDIATE ACTION See another provider immediately if unable to talk with your provider within 1 hour. Consider the closest urgent care or ED if an office or clinic is not available. Another adult should drive. ~

## 2012-05-21 ENCOUNTER — Ambulatory Visit (INDEPENDENT_AMBULATORY_CARE_PROVIDER_SITE_OTHER): Payer: Medicare Other | Admitting: Internal Medicine

## 2012-05-21 ENCOUNTER — Encounter: Payer: Self-pay | Admitting: Internal Medicine

## 2012-05-21 VITALS — BP 128/88 | HR 54 | Temp 97.8°F | Wt 203.0 lb

## 2012-05-21 DIAGNOSIS — J209 Acute bronchitis, unspecified: Secondary | ICD-10-CM

## 2012-05-21 DIAGNOSIS — J029 Acute pharyngitis, unspecified: Secondary | ICD-10-CM

## 2012-05-21 DIAGNOSIS — J069 Acute upper respiratory infection, unspecified: Secondary | ICD-10-CM

## 2012-05-21 MED ORDER — AMOXICILLIN 500 MG PO CAPS: 500.0000 mg | ORAL_CAPSULE | Freq: Three times a day (TID) | ORAL | Status: DC

## 2012-05-21 MED ORDER — FLUTICASONE PROPIONATE 50 MCG/ACT NA SUSP: 1.0000 | Freq: Two times a day (BID) | NASAL | Status: DC | PRN

## 2012-05-21 NOTE — Progress Notes (Signed)
  Subjective:    Patient ID: Luis [name], male    DOB: Aug 22, 1934, 77 y.o.   MRN: [id number]  HPI  He has had a sore throat for approximately 3 weeks. Initially he had some head congestion but this has improved. He has frontal headache as well as nasal purulence with yellow discharge. He also has a cough with yellow sputum. He has treated this with over-the-counter medications. He been out of town and therefore is only now coming in for the symptoms    Review of Systems He denies facial pain, fever, chills, or sweats. He has no associated shortness of breath and wheezing. There has been no associated extrinsic symptoms of itchy, watery eyes, or sneezing.     Objective:   Physical Exam General appearance:good health ;well nourished; no acute distress or increased work of breathing is present.  L submandibular   lymphadenopathy ; none in axilla . Appears younger than stated age   Eyes: No conjunctival inflammation or lid edema is present. OS pupil asymmetric.  Ears:  External ear exam shows no significant lesions or deformities.  Otoscopic examination reveals clear canals, tympanic membranes are intact bilaterally without bulging, retraction, inflammation or discharge.  Nose:  External nasal examination shows no deformity or inflammation. Nasal mucosa are dry without lesions or exudates. Leftward septal  deviation.No obstruction to airflow.   Oral exam: Dental hygiene is good; lips and gums are healthy appearing.There is no oropharyngeal erythema or exudate noted. Upper & lower partials  Neck:  No deformities,  masses, or tenderness noted.   Supple with full range of motion without pain.   Heart:  Normal rate and regular rhythm. S1 and S2 normal without gallop, murmur, click, rub or other extra sounds. Heart soundsdistant  Lungs:Chest clear to auscultation; no wheezes, rhonchi,rales ,or rubs present.No increased work of breathing.  Decreased BS  Extremities:  No cyanosis, edema, or  clubbing  noted    Skin: Warm & dry          Assessment & Plan:  #1 acute bronchitis w/o bronchospasm #2 URI Plan: See orders and recommendations

## 2012-05-21 NOTE — Patient Instructions (Addendum)
Plain Mucinex for thick secretions ;force NON dairy fluids . Use a Neti pot daily as needed for sinus congestion; going from open side to congested side . Nasal cleansing in the shower as discussed. Make sure that all residual soap is removed to prevent irritation. Fluticasone 1 spray in each nostril twice a day as needed. Use the "crossover" technique as discussed. Plain Allegra 160 daily as needed for itchy eyes & sneezing.    

## 2012-05-30 ENCOUNTER — Other Ambulatory Visit: Payer: Self-pay | Admitting: Cardiovascular Disease

## 2012-05-30 MED ORDER — CLOPIDOGREL BISULFATE 75 MG PO TABS: 75.0000 mg | ORAL_TABLET | Freq: Every day | ORAL | Status: AC

## 2012-07-23 ENCOUNTER — Other Ambulatory Visit: Payer: Medicare Other

## 2012-07-31 ENCOUNTER — Encounter: Payer: Self-pay | Admitting: Cardiovascular Disease

## 2012-07-31 ENCOUNTER — Ambulatory Visit (INDEPENDENT_AMBULATORY_CARE_PROVIDER_SITE_OTHER): Payer: Medicare Other | Admitting: Cardiovascular Disease

## 2012-07-31 VITALS — BP 120/78 | HR 58 | Ht 72.0 in | Wt 203.0 lb

## 2012-07-31 DIAGNOSIS — I779 Disorder of arteries and arterioles, unspecified: Secondary | ICD-10-CM

## 2012-07-31 DIAGNOSIS — I2510 Atherosclerotic heart disease of native coronary artery without angina pectoris: Secondary | ICD-10-CM

## 2012-07-31 NOTE — Patient Instructions (Signed)
Your physician wants you to follow-up in: 6 months. You will receive a reminder letter in the mail two months in advance. If you don't receive a letter, please call our office to schedule the follow-up appointment.  You have been referred to [facility] for screening.  Have fasting lab work done at [facility].

## 2012-07-31 NOTE — Progress Notes (Signed)
History of Present Illness: Luis [name] is a pleasant 78 year old white male with history of CAD, HTN, HLD here today for cardiac follow up. His cardiac history dates back to 2001. He had a bare-metal stent placed in the right coronary artery in 2001 with a staged intervention to the circumflex coronary artery with placement of a bare-metal stent in the mid circumflex vessel and balloon angioplasty of the ostium of the first obtuse marginal coronary artery at that time. Cardiac cath February 2013 with placement of two non-overlapping DES in the RCA and severe disease in the small distal Circumflex treated with balloon angioplasty only. Carotid artery dopplers 06/07/10 with mild bilateral disease.   He is here today for follow up. He is doing well. No chest pain or SOB. He is back to exercising three days per week. Still working full time.   Primary Care Physician: Alwyn Ren  Last Lipid Profile:Lipid Panel     Component Value Date/Time   CHOL 132 06/26/2011 0813   TRIG 51.0 06/26/2011 0813   HDL 48.70 06/26/2011 0813   CHOLHDL 3 06/26/2011 0813   VLDL 10.2 06/26/2011 0813   LDLCALC 73 06/26/2011 0813     Past Medical History  Diagnosis Date  . Unspecified essential hypertension   . HYPERLIPIDEMIA   . CORONARY ARTERY DISEASE     Plavix as of 07/30/11; Dr Clifton James  . COLONIC POLYPS, HX OF     Dr Kinnie Scales  . DJD (degenerative joint disease)   . Myocardial infarction 08/1999  . Angina     Past Surgical History  Procedure Laterality Date  . Coronary angioplasty with stent placement  2001; 07/30/11;    "2+3"  . Cardiac catheterization  ~ 2002  . Coronary angioplasty  07/30/11  . Cataract extraction w/ intraocular lens  implant, bilateral  ~ 2007    bilaterally  . Retinal detachment surgery  05/2008; 06/2008; 07/2008    left; "all 3 surgeries have failed"  . Inguinal hernia repair  2005    left    Current Outpatient Prescriptions  Medication Sig Dispense Refill  . amoxicillin (AMOXIL) 500  MG capsule Take 1 capsule (500 mg total) by mouth 3 (three) times daily.  30 capsule  0  . aspirin 81 MG tablet Take 81 mg by mouth daily.        Marland Kitchen atenolol (TENORMIN) 25 MG tablet Take 1 tablet (25 mg total) by mouth daily.  30 tablet  6  . atorvastatin (LIPITOR) 80 MG tablet Take 1 tablet (80 mg total) by mouth daily.  30 tablet  5  . clopidogrel (PLAVIX) 75 MG tablet Take 1 tablet (75 mg total) by mouth daily with breakfast.  30 tablet  6  . ezetimibe (ZETIA) 10 MG tablet Take 1 tablet (10 mg total) by mouth daily.  30 tablet  5  . fluticasone (FLONASE) 50 MCG/ACT nasal spray Place 1 spray into the nose 2 (two) times daily as needed for rhinitis.  16 g  2  . folic acid (FOLVITE) 1 MG tablet TAKE 1 TABLET ONCE DAILY.  100 tablet  1  . Glucosamine HCl 1000 MG TABS Take by mouth daily.      . Misc Natural Products (BLACK CHERRY CONCENTRATE PO) Take 400 mg by mouth daily. 2 BY MOUTH ONCE DAILY      . nitroGLYCERIN (NITROSTAT) 0.4 MG SL tablet Place 0.4 mg under the tongue every 5 (five) minutes as needed. For chest pain      . Saw  Palmetto, Serenoa repens, (SAW PALMETTO PO) Take 400 mg by mouth daily.       No current facility-administered medications for this visit.    No Known Allergies  History   Social History  . Marital Status: Married    Spouse Name: N/A    Number of Children: N/A  . Years of Education: N/A   Occupational History  . Not on file.   Social History Main Topics  . Smoking status: Former Smoker -- 0.50 packs/day for 40 years    Types: Cigarettes    Quit date: 06/12/1999  . Smokeless tobacco: Never Used  . 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 Drug Use: No  . Sexually Active: Not Currently   Other Topics Concern  . Not on file   Social History Narrative  . No narrative on file    Family History  Problem Relation Age of Onset  . Colon cancer Father     in 24s  . Prostate cancer Father   . Alcohol abuse Mother       Review of Systems:  As stated in the HPI and otherwise negative.   BP 120/78  Pulse 58  Ht 6' (1.829 m)  Wt 203 lb (92.08 kg)  BMI 27.53 kg/m2  Physical Examination: General: Well developed, well nourished, NAD HEENT: OP clear, mucus membranes moist SKIN: warm, dry. No rashes. Neuro: No focal deficits Musculoskeletal: Muscle strength 5/5 all ext Psychiatric: Mood and affect normal Neck: No JVD, no carotid bruits, no thyromegaly, no lymphadenopathy. Lungs:Clear bilaterally, no wheezes, rhonci, crackles Cardiovascular: Regular rate and rhythm. No murmurs, gallops or rubs. Abdomen:Soft. Bowel sounds present. Non-tender.  Extremities: No lower extremity edema. Pulses are 2 + in the bilateral DP/PT.  EKG: Sinus brady, rate 58 bpm. 1st degree AV block.   Assessment and Plan:   1. CORONARY ARTERY DISEASE: Stable post PTCA/DES x 2 RCA, balloon angioplasty distal Circumflex in February 2013. Will continue current meds including ASA, Plavix, statin, Zetia and beta blocker. He has multiple coronary stents. Will continue dual anti-platelet therapy for lifetime. Will update lipids and LFTs today. BP is well controlled. If he needs a screening colonoscopy or any surgical procedures, ok to hold Plavix for procedure. Will refer to [facility] for screening.   2. Carotid artery disease: Mild disease. Will need repeat dopplers in one year.

## 2012-08-19 ENCOUNTER — Other Ambulatory Visit: Payer: Self-pay | Admitting: Cardiology

## 2012-09-23 ENCOUNTER — Other Ambulatory Visit: Payer: Self-pay

## 2012-09-23 MED ORDER — EZETIMIBE 10 MG PO TABS: 10.0000 mg | ORAL_TABLET | Freq: Every day | ORAL | Status: DC

## 2012-12-07 ENCOUNTER — Other Ambulatory Visit: Payer: Self-pay | Admitting: Internal Medicine

## 2012-12-08 NOTE — Telephone Encounter (Signed)
Refill done.  

## 2013-02-02 ENCOUNTER — Other Ambulatory Visit: Payer: Self-pay

## 2013-02-02 MED ORDER — ATENOLOL 25 MG PO TABS: 25.0000 mg | ORAL_TABLET | Freq: Every day | ORAL | Status: DC

## 2013-04-06 ENCOUNTER — Other Ambulatory Visit: Payer: Self-pay | Admitting: Cardiovascular Disease

## 2013-05-25 ENCOUNTER — Other Ambulatory Visit: Payer: Self-pay | Admitting: Cardiovascular Disease

## 2013-05-25 ENCOUNTER — Other Ambulatory Visit: Payer: Self-pay | Admitting: Internal Medicine

## 2013-05-26 NOTE — Telephone Encounter (Signed)
Med filled.  

## 2013-06-02 ENCOUNTER — Other Ambulatory Visit: Payer: Self-pay | Admitting: Cardiovascular Disease

## 2013-07-11 ENCOUNTER — Other Ambulatory Visit: Payer: Self-pay | Admitting: Cardiovascular Disease

## 2013-07-23 ENCOUNTER — Other Ambulatory Visit: Payer: Self-pay | Admitting: Cardiovascular Disease

## 2013-09-16 ENCOUNTER — Other Ambulatory Visit: Payer: Self-pay | Admitting: Cardiovascular Disease

## 2013-10-14 ENCOUNTER — Other Ambulatory Visit: Payer: Self-pay | Admitting: Internal Medicine

## 2013-10-20 ENCOUNTER — Other Ambulatory Visit: Payer: Self-pay | Admitting: Cardiovascular Disease

## 2013-11-02 ENCOUNTER — Other Ambulatory Visit: Payer: Self-pay | Admitting: Cardiovascular Disease

## 2013-11-26 ENCOUNTER — Telehealth: Payer: Self-pay | Admitting: Cardiovascular Disease

## 2013-11-26 DIAGNOSIS — I2510 Atherosclerotic heart disease of native coronary artery without angina pectoris: Secondary | ICD-10-CM

## 2013-11-26 DIAGNOSIS — E7800 Pure hypercholesterolemia, unspecified: Secondary | ICD-10-CM

## 2013-11-26 DIAGNOSIS — Z0000 Encounter for general adult medical examination without abnormal findings: Secondary | ICD-10-CM

## 2013-11-26 NOTE — Telephone Encounter (Signed)
New Message:  Pt is requesting to have labs drawn at our [place] office. Pt has no orders... Pt is requesting a call back from the nurse when orders have been placed in his chart

## 2013-11-26 NOTE — Telephone Encounter (Signed)
Spoke with pt who is requesting labs to be drawn at [place] office. His primary MD has retired and he is going to talk with Dr. Angelena Form for recommendations for new MD at upcoming visit in August. Will order Lipid, liver profiles, CBC, BMP and PSA. Pt aware to be fasting for labs.

## 2013-12-11 ENCOUNTER — Other Ambulatory Visit: Payer: Self-pay | Admitting: Cardiovascular Disease

## 2014-01-04 ENCOUNTER — Other Ambulatory Visit: Payer: Self-pay | Admitting: Cardiovascular Disease

## 2014-01-13 ENCOUNTER — Other Ambulatory Visit (INDEPENDENT_AMBULATORY_CARE_PROVIDER_SITE_OTHER): Payer: Commercial Managed Care - HMO

## 2014-01-13 DIAGNOSIS — I2510 Atherosclerotic heart disease of native coronary artery without angina pectoris: Secondary | ICD-10-CM

## 2014-01-13 DIAGNOSIS — E7800 Pure hypercholesterolemia, unspecified: Secondary | ICD-10-CM

## 2014-01-13 DIAGNOSIS — Z0000 Encounter for general adult medical examination without abnormal findings: Secondary | ICD-10-CM

## 2014-01-13 LAB — CBC WITH DIFFERENTIAL/PLATELET
BASOS PCT: 0.7 % (ref 0.0–3.0)
Basophils Absolute: 0 10*3/uL (ref 0.0–0.1)
EOS ABS: 0.1 10*3/uL (ref 0.0–0.7)
EOS PCT: 2 % (ref 0.0–5.0)
HEMATOCRIT: 45.5 % (ref 39.0–52.0)
Hemoglobin: 15.5 g/dL (ref 13.0–17.0)
LYMPHS ABS: 2 10*3/uL (ref 0.7–4.0)
Lymphocytes Relative: 35.9 % (ref 12.0–46.0)
MCHC: 34 g/dL (ref 30.0–36.0)
MCV: 91.6 fl (ref 78.0–100.0)
Monocytes Absolute: 0.5 10*3/uL (ref 0.1–1.0)
Monocytes Relative: 9.2 % (ref 3.0–12.0)
Neutro Abs: 2.8 10*3/uL (ref 1.4–7.7)
Neutrophils Relative %: 52.2 % (ref 43.0–77.0)
PLATELETS: 263 10*3/uL (ref 150.0–400.0)
RBC: 4.97 Mil/uL (ref 4.22–5.81)
RDW: 14 % (ref 11.5–15.5)
WBC: 5.4 10*3/uL (ref 4.0–10.5)

## 2014-01-13 LAB — BASIC METABOLIC PANEL
BUN: 18 mg/dL (ref 6–23)
CALCIUM: 9.8 mg/dL (ref 8.4–10.5)
CO2: 26 mEq/L (ref 19–32)
Chloride: 105 mEq/L (ref 96–112)
Creatinine, Ser: 1 mg/dL (ref 0.4–1.5)
GFR: 80.74 mL/min (ref >60.00)
GLUCOSE: 98 mg/dL (ref 70–99)
Potassium: 4.8 mEq/L (ref 3.5–5.1)
Sodium: 139 mEq/L (ref 135–145)

## 2014-01-13 LAB — HEPATIC FUNCTION PANEL
ALT: 31 U/L (ref 0–53)
AST: 26 U/L (ref 0–37)
Albumin: 4 g/dL (ref 3.5–5.2)
Alkaline Phosphatase: 61 U/L (ref 39–117)
BILIRUBIN TOTAL: 0.8 mg/dL (ref 0.2–1.2)
Bilirubin, Direct: 0.1 mg/dL (ref 0.0–0.3)
Total Protein: 6.7 g/dL (ref 6.0–8.3)

## 2014-01-13 LAB — LIPID PANEL
CHOLESTEROL: 146 mg/dL (ref 0–200)
HDL: 55.9 mg/dL (ref >39.00)
LDL Cholesterol: 78 mg/dL (ref 0–99)
NonHDL: 90.1
Total CHOL/HDL Ratio: 3
Triglycerides: 63 mg/dL (ref 0.0–149.0)
VLDL: 12.6 mg/dL (ref 0.0–40.0)

## 2014-01-13 LAB — PSA: PSA: 2.8 ng/mL (ref 0.10–4.00)

## 2014-01-14 ENCOUNTER — Telehealth: Payer: Self-pay | Admitting: Cardiovascular Disease

## 2014-01-14 NOTE — Telephone Encounter (Signed)
New message ° ° °Patient returning call back to nurse.  °

## 2014-01-14 NOTE — Telephone Encounter (Signed)
Spoke with pt and reviewed recent lab results with him.  

## 2014-02-01 ENCOUNTER — Encounter: Payer: Self-pay | Admitting: Cardiovascular Disease

## 2014-02-01 ENCOUNTER — Ambulatory Visit (INDEPENDENT_AMBULATORY_CARE_PROVIDER_SITE_OTHER): Payer: Commercial Managed Care - HMO | Admitting: Cardiovascular Disease

## 2014-02-01 VITALS — BP 134/72 | HR 64 | Ht 72.0 in | Wt 205.8 lb

## 2014-02-01 DIAGNOSIS — I779 Disorder of arteries and arterioles, unspecified: Secondary | ICD-10-CM

## 2014-02-01 DIAGNOSIS — I10 Essential (primary) hypertension: Secondary | ICD-10-CM

## 2014-02-01 DIAGNOSIS — I739 Peripheral vascular disease, unspecified: Secondary | ICD-10-CM

## 2014-02-01 DIAGNOSIS — E7800 Pure hypercholesterolemia, unspecified: Secondary | ICD-10-CM

## 2014-02-01 DIAGNOSIS — I2510 Atherosclerotic heart disease of native coronary artery without angina pectoris: Secondary | ICD-10-CM

## 2014-02-01 DIAGNOSIS — Z86010 Personal history of colonic polyps: Secondary | ICD-10-CM

## 2014-02-01 NOTE — Patient Instructions (Addendum)
Your physician wants you to follow-up in: 12 months.   You will receive a reminder letter in the mail two months in advance. If you don't receive a letter, please call our office to schedule the follow-up appointment.  You have been referred to  [facility]. Please schedule appointment for patient

## 2014-02-01 NOTE — Progress Notes (Signed)
History of Present Illness: Luis [name] is a pleasant 79 yo white male with history of CAD, HTN, HLD here today for cardiac follow up. His cardiac history dates back to 2001. He had a bare-metal stent placed in the right coronary artery in 2001 with a staged intervention to the circumflex coronary artery with placement of a bare-metal stent in the mid circumflex vessel and balloon angioplasty of the ostium of the first obtuse marginal coronary artery at that time. Cardiac cath February 2013 with placement of two non-overlapping DES in the RCA and severe disease in the small distal Circumflex treated with balloon angioplasty only. Carotid artery dopplers 06/07/10 with mild bilateral disease.   He is here today for follow up. He is doing well. No chest pain or SOB. He has not been exercising. Still working full time.   Primary Care Physician: Linna Darner  Last Lipid Profile:Lipid Panel     Component Value Date/Time   CHOL 146 01/13/2014 0849   TRIG 63.0 01/13/2014 0849   HDL 55.90 01/13/2014 0849   CHOLHDL 3 01/13/2014 0849   VLDL 12.6 01/13/2014 0849   LDLCALC 78 01/13/2014 0849   Past Medical History  Diagnosis Date  . Unspecified essential hypertension   . HYPERLIPIDEMIA   . CORONARY ARTERY DISEASE     Plavix as of 07/30/11; Dr Angelena Form  . COLONIC POLYPS, HX OF     Dr Earlean Shawl  . DJD (degenerative joint disease)   . Myocardial infarction 08/1999  . Angina     Past Surgical History  Procedure Laterality Date  . Coronary angioplasty with stent placement  2001; 07/30/11;    "2+3"  . Cardiac catheterization  ~ 2002  . Coronary angioplasty  07/30/11  . Cataract extraction w/ intraocular lens  implant, bilateral  ~ 2007    bilaterally  . Retinal detachment surgery  05/2008; 06/2008; 07/2008    left; "all 3 surgeries have failed"  . Inguinal hernia repair  2005    left    Current Outpatient Prescriptions  Medication Sig Dispense Refill  . aspirin 81 MG tablet Take 81 mg by mouth daily.        Marland Kitchen  atenolol (TENORMIN) 25 MG tablet TAKE 1 TABLET ONCE DAILY.  30 tablet  0  . atorvastatin (LIPITOR) 80 MG tablet TAKE 1 TABLET ONCE DAILY.  30 tablet  0  . clopidogrel (PLAVIX) 75 MG tablet TAKE 1 TABLET EVERY DAY WITH BREAKFAST.  30 tablet  0  . folic acid (FOLVITE) 1 MG tablet TAKE 1 TABLET ONCE DAILY.  30 tablet  0  . Glucosamine HCl 1000 MG TABS Take by mouth daily.      . Misc Natural Products (BLACK CHERRY CONCENTRATE PO) Take 400 mg by mouth daily. 2 BY MOUTH ONCE DAILY      . NITROSTAT 0.4 MG SL tablet DISSOLVE 1 TABLET UNDER TONGUE AS NEEDED FOR CHEST PAIN,MAY REPEAT IN5 MINUTES FOR 2 DOSES.  25 tablet  0  . Potassium Aminobenzoate (POTABA) 500 MG TABS Take 1 tablet by mouth daily.      . Saw Palmetto, Serenoa repens, (SAW PALMETTO PO) Take 400 mg by mouth daily.      Marland Kitchen ZETIA 10 MG tablet TAKE (1) TABLET DAILY AT BEDTIME.  30 tablet  0   No current facility-administered medications for this visit.    No Known Allergies  History   Social History  . Marital Status: Married    Spouse Name: N/A    Number  of Children: N/A  . Years of Education: N/A   Occupational History  . Not on file.   Social History Main Topics  . Smoking status: Former Smoker -- 0.50 packs/day for 40 years    Types: Cigarettes    Quit date: 06/12/1999  . Smokeless tobacco: Never Used  . Alcohol Use: No     Comment: 07/30/11 "recovering alcoholic; since 02/11/7901"  . Drug Use: No  . Sexual Activity: Not Currently   Other Topics Concern  . Not on file   Social History Narrative  . No narrative on file    Family History  Problem Relation Age of Onset  . Colon cancer Father     in 74s  . Prostate cancer Father   . Alcohol abuse Mother     Review of Systems:  As stated in the HPI and otherwise negative.   BP 134/72  Pulse 64  Ht 6' (1.829 m)  Wt 205 lb 12.8 oz (93.35 kg)  BMI 27.91 kg/m2  Physical Examination: General: Well developed, well nourished, NAD HEENT: OP clear, mucus membranes  moist SKIN: warm, dry. No rashes. Neuro: No focal deficits Musculoskeletal: Muscle strength 5/5 all ext Psychiatric: Mood and affect normal Neck: No JVD, no carotid bruits, no thyromegaly, no lymphadenopathy. Lungs:Clear bilaterally, no wheezes, rhonci, crackles Cardiovascular: Regular rate and rhythm. No murmurs, gallops or rubs. Abdomen:Soft. Bowel sounds present. Non-tender.  Extremities: No lower extremity edema. Pulses are 2 + in the bilateral DP/PT.  EKG: Sinus, rate 64 bpm. 1st degree AV block. PVCs  Assessment and Plan:   1. CORONARY ARTERY DISEASE: Stable. Will continue current meds including ASA, Plavix, statin, Zetia and beta blocker. Lifelong dual anti-platelet therapy given multiple coronary stents.    2. Carotid artery disease: Mild disease. Will need repeat dopplers in 2016  3. HTN: BP controlled. No changes.   4. HLD: Lipids well controlled. Continue statin.

## 2014-02-14 ENCOUNTER — Other Ambulatory Visit: Payer: Self-pay | Admitting: Cardiovascular Disease

## 2014-03-08 ENCOUNTER — Encounter: Payer: Self-pay | Admitting: Cardiovascular Disease

## 2014-05-20 ENCOUNTER — Encounter (HOSPITAL_COMMUNITY): Payer: Self-pay | Admitting: Cardiovascular Disease

## 2014-08-24 ENCOUNTER — Telehealth: Payer: Self-pay

## 2014-08-24 NOTE — Telephone Encounter (Signed)
LVM for pt to call back regarding immunization record and flu vaccination for 2015/2016 season.

## 2014-09-07 ENCOUNTER — Other Ambulatory Visit: Payer: Self-pay | Admitting: Cardiovascular Disease

## 2014-11-05 ENCOUNTER — Other Ambulatory Visit: Payer: Self-pay | Admitting: Cardiovascular Disease

## 2014-11-09 NOTE — Telephone Encounter (Signed)
Per note 8.2.15

## 2014-11-15 ENCOUNTER — Telehealth: Payer: Self-pay | Admitting: Cardiovascular Disease

## 2014-11-15 MED ORDER — ATENOLOL 50 MG PO TABS
50.0000 mg | ORAL_TABLET | Freq: Every day | ORAL | Status: DC
Start: 2014-11-15 — End: 2015-11-26

## 2014-11-15 NOTE — Telephone Encounter (Signed)
Spoke with pt. He reports he hasn't checked blood pressure regularly since last fall. He felt light-headed about a week ago and began checking blood pressures. He reports readings of 178-183/96-97. One reading in last week of diastolic of 888.  Heart rate 70's -80's.  At work this AM and blood pressure 179/102. Reports some ongoing lightheadedness and little headache.  Taking atenolol as listed. Will review with Dr. Angelena Form

## 2014-11-15 NOTE — Telephone Encounter (Signed)
I would increase atenolol to 50 mg daily and follow BP over the next week then let us know. Gerald Stabs

## 2014-11-15 NOTE — Telephone Encounter (Signed)
Pt notified. Will send prescription to [facility]

## 2014-11-15 NOTE — Telephone Encounter (Signed)
Pt c/o BP issue: STAT if pt c/o blurred vision, one-sided weakness or slurred speech  1. What are your last 5 BP readings? 179/102 this morning  2. Are you having any other symptoms (ex. Dizziness, headache, blurred vision, passed out)? dizziness  3. What is your BP issue? bp is too high.Marland Kitchen

## 2014-11-29 ENCOUNTER — Inpatient Hospital Stay (HOSPITAL_COMMUNITY): Payer: Medicare HMO

## 2014-11-29 ENCOUNTER — Emergency Department (HOSPITAL_COMMUNITY): Payer: Medicare HMO

## 2014-11-29 ENCOUNTER — Encounter (HOSPITAL_COMMUNITY): Payer: Self-pay | Admitting: Emergency Medicine

## 2014-11-29 ENCOUNTER — Inpatient Hospital Stay (HOSPITAL_COMMUNITY)
Admission: EM | Admit: 2014-11-29 | Discharge: 2014-11-30 | DRG: 069 | Disposition: A | Discharge status: Home / Self Care | Payer: Medicare HMO | Attending: Internal Medicine | Admitting: Internal Medicine

## 2014-11-29 DIAGNOSIS — Z7902 Long term (current) use of antithrombotics/antiplatelets: Secondary | ICD-10-CM

## 2014-11-29 DIAGNOSIS — Z87891 Personal history of nicotine dependence: Secondary | ICD-10-CM | POA: Diagnosis not present

## 2014-11-29 DIAGNOSIS — I639 Cerebral infarction, unspecified: Secondary | ICD-10-CM | POA: Diagnosis not present

## 2014-11-29 DIAGNOSIS — Z79899 Other long term (current) drug therapy: Secondary | ICD-10-CM

## 2014-11-29 DIAGNOSIS — I10 Essential (primary) hypertension: Secondary | ICD-10-CM | POA: Diagnosis present

## 2014-11-29 DIAGNOSIS — R4781 Slurred speech: Secondary | ICD-10-CM | POA: Diagnosis present

## 2014-11-29 DIAGNOSIS — I4891 Unspecified atrial fibrillation: Secondary | ICD-10-CM | POA: Diagnosis present

## 2014-11-29 DIAGNOSIS — G459 Transient cerebral ischemic attack, unspecified: Secondary | ICD-10-CM | POA: Diagnosis present

## 2014-11-29 DIAGNOSIS — Z7982 Long term (current) use of aspirin: Secondary | ICD-10-CM | POA: Diagnosis not present

## 2014-11-29 DIAGNOSIS — G451 Carotid artery syndrome (hemispheric): Secondary | ICD-10-CM

## 2014-11-29 DIAGNOSIS — I2510 Atherosclerotic heart disease of native coronary artery without angina pectoris: Secondary | ICD-10-CM | POA: Diagnosis present

## 2014-11-29 DIAGNOSIS — E785 Hyperlipidemia, unspecified: Secondary | ICD-10-CM | POA: Diagnosis present

## 2014-11-29 DIAGNOSIS — Z955 Presence of coronary angioplasty implant and graft: Secondary | ICD-10-CM

## 2014-11-29 DIAGNOSIS — I252 Old myocardial infarction: Secondary | ICD-10-CM | POA: Diagnosis not present

## 2014-11-29 DIAGNOSIS — G458 Other transient cerebral ischemic attacks and related syndromes: Secondary | ICD-10-CM

## 2014-11-29 LAB — COMPREHENSIVE METABOLIC PANEL
ALBUMIN: 4 g/dL (ref 3.5–5.0)
ALK PHOS: 54 U/L (ref 38–126)
ALT: 32 U/L (ref 17–63)
AST: 24 U/L (ref 15–41)
Anion gap: 10 (ref 5–15)
BUN: 15 mg/dL (ref 6–20)
CHLORIDE: 110 mmol/L (ref 101–111)
CO2: 20 mmol/L — AB (ref 22–32)
Calcium: 9.3 mg/dL (ref 8.9–10.3)
Creatinine, Ser: 0.97 mg/dL (ref 0.61–1.24)
Glucose, Bld: 100 mg/dL — ABNORMAL HIGH (ref 65–99)
POTASSIUM: 4.2 mmol/L (ref 3.5–5.1)
Sodium: 140 mmol/L (ref 135–145)
Total Bilirubin: 0.7 mg/dL (ref 0.3–1.2)
Total Protein: 6.4 g/dL — ABNORMAL LOW (ref 6.5–8.1)

## 2014-11-29 LAB — URINALYSIS, ROUTINE W REFLEX MICROSCOPIC
Bilirubin Urine: NEGATIVE
Glucose, UA: NEGATIVE mg/dL
Hgb urine dipstick: NEGATIVE
Ketones, ur: NEGATIVE mg/dL
LEUKOCYTES UA: NEGATIVE
Nitrite: NEGATIVE
PROTEIN: NEGATIVE mg/dL
SPECIFIC GRAVITY, URINE: 1.015 (ref 1.005–1.030)
Urobilinogen, UA: 0.2 mg/dL (ref 0.0–1.0)
pH: 6 (ref 5.0–8.0)

## 2014-11-29 LAB — CBC
HEMATOCRIT: 43.3 % (ref 39.0–52.0)
Hemoglobin: 14.6 g/dL (ref 13.0–17.0)
MCH: 30.7 pg (ref 26.0–34.0)
MCHC: 33.7 g/dL (ref 30.0–36.0)
MCV: 91 fL (ref 78.0–100.0)
PLATELETS: 199 10*3/uL (ref 150–400)
RBC: 4.76 MIL/uL (ref 4.22–5.81)
RDW: 13.7 % (ref 11.5–15.5)
WBC: 6.1 10*3/uL (ref 4.0–10.5)

## 2014-11-29 LAB — DIFFERENTIAL
Basophils Absolute: 0 10*3/uL (ref 0.0–0.1)
Basophils Relative: 1 % (ref 0–1)
EOS PCT: 2 % (ref 0–5)
Eosinophils Absolute: 0.1 10*3/uL (ref 0.0–0.7)
Lymphocytes Relative: 39 % (ref 12–46)
Lymphs Abs: 2.3 10*3/uL (ref 0.7–4.0)
MONO ABS: 0.6 10*3/uL (ref 0.1–1.0)
Monocytes Relative: 10 % (ref 3–12)
NEUTROS ABS: 3 10*3/uL (ref 1.7–7.7)
Neutrophils Relative %: 48 % (ref 43–77)

## 2014-11-29 LAB — RAPID URINE DRUG SCREEN, HOSP PERFORMED
AMPHETAMINES: NOT DETECTED
BARBITURATES: NOT DETECTED
BENZODIAZEPINES: NOT DETECTED
Cocaine: NOT DETECTED
Opiates: NOT DETECTED
TETRAHYDROCANNABINOL: NOT DETECTED

## 2014-11-29 LAB — PROTIME-INR
INR: 1.06 (ref 0.00–1.49)
Prothrombin Time: 14 seconds (ref 11.6–15.2)

## 2014-11-29 LAB — APTT: aPTT: 27 seconds (ref 24–37)

## 2014-11-29 LAB — ETHANOL

## 2014-11-29 LAB — I-STAT CHEM 8, ED
BUN: 16 mg/dL (ref 6–20)
CALCIUM ION: 1.16 mmol/L (ref 1.13–1.30)
CHLORIDE: 105 mmol/L (ref 101–111)
CREATININE: 0.9 mg/dL (ref 0.61–1.24)
Glucose, Bld: 96 mg/dL (ref 65–99)
HEMATOCRIT: 45 % (ref 39.0–52.0)
Hemoglobin: 15.3 g/dL (ref 13.0–17.0)
POTASSIUM: 4.1 mmol/L (ref 3.5–5.1)
Sodium: 141 mmol/L (ref 135–145)
TCO2: 19 mmol/L (ref 0–100)

## 2014-11-29 LAB — I-STAT TROPONIN, ED: Troponin i, poc: 0.01 ng/mL (ref 0.00–0.08)

## 2014-11-29 LAB — CBG MONITORING, ED: Glucose-Capillary: 92 mg/dL (ref 65–99)

## 2014-11-29 MED ORDER — ATENOLOL 25 MG PO TABS
50.0000 mg | ORAL_TABLET | Freq: Every day | ORAL | Status: DC
Start: 2014-11-29 — End: 2014-11-30
  Administered 2014-11-29 – 2014-11-30 (×2): 50 mg via ORAL
  Filled 2014-11-29 (×2): qty 2

## 2014-11-29 MED ORDER — ATORVASTATIN CALCIUM 80 MG PO TABS
80.0000 mg | ORAL_TABLET | Freq: Every day | ORAL | Status: DC
  Administered 2014-11-29 – 2014-11-30 (×2): 80 mg via ORAL
  Filled 2014-11-29 (×4): qty 1

## 2014-11-29 MED ORDER — APIXABAN 5 MG PO TABS
5.0000 mg | ORAL_TABLET | Freq: Two times a day (BID) | ORAL | Status: DC
Start: 2014-11-29 — End: 2014-11-30
  Administered 2014-11-29 – 2014-11-30 (×2): 5 mg via ORAL
  Filled 2014-11-29 (×3): qty 1

## 2014-11-29 MED ORDER — IOHEXOL 350 MG/ML SOLN
50.0000 mL | Freq: Once | INTRAVENOUS | Status: DC | PRN
Start: 2014-11-29 — End: 2014-11-29

## 2014-11-29 MED ORDER — STROKE: EARLY STAGES OF RECOVERY BOOK
Freq: Once | Status: AC
  Administered 2014-11-29: 18:00:00
  Filled 2014-11-29: qty 1

## 2014-11-29 NOTE — ED Notes (Signed)
Pt in CT for CT angio with RN

## 2014-11-29 NOTE — ED Notes (Signed)
Cardiology at bedside.

## 2014-11-29 NOTE — Consult Note (Signed)
CARDIOLOGY CONSULT NOTE   Patient ID: Luis [name] MRN: [id number] DOB/AGE: March 31, 1935 80 y.o.  Admit date: 11/29/2014  Primary Physician   Unice Cobble, MD Primary Cardiologist  Dr. Angelena Form Reason for Consultation   Atrial Fibrillation  HPI: Luis [name] is a 80 y.o. male with a history of CAD, HTN, HL, 1st degree AV block who brought [facility] ED by EMS with code stroke.  His cardiac history dates back to 2001. He had a bare-metal stent placed in the right coronary artery in 2001 with a staged intervention to the circumflex coronary artery with placement of a bare-metal stent in the mid circumflex vessel and balloon angioplasty of the ostium of the first obtuse marginal coronary artery at that time. Cardiac cath February 2013 with placement of two non-overlapping DES in the RCA and severe disease in the small distal Circumflex treated with balloon angioplasty only. Carotid artery dopplers 06/07/10 with mild bilateral disease.   He was doing well when last seen by Dr. Angelena Form 01/2014. Recently called office 11/15/14 for high blood pressure (179/102, rate 70s-80s), lightheadedness and headache. His atenolol increased to 50mg  daily and notified BP reading to clinic. He is on ASA and plavix.   Patient had sudden onset difficulty with speech at 1420 while walking to wood shop in yard.This was witnessed by his wife and lasted 1-2 minutes with period of confusion and numb tongue then resolved.Patiend had a second episode of lightheadedness and numbness while transporting to ED by EMS.  Code stroke activated. On arrival patient was noted to have left facial droop but per wife this is old. Denies chest pain, SOB, palpitation, orthopnea, PND or LE edema.  CT of head showed atrophy and chronic ischemic white matter disease without acute intracranial abnormality. EKG showed afib at rate of 68. Lytes normal, POC trop normal. Patient has a plan to go on cruise with family on July 9th,  2016.   Past Medical History  Diagnosis Date  . Unspecified essential hypertension   . HYPERLIPIDEMIA   . CORONARY ARTERY DISEASE     Plavix as of 07/30/11; Dr Angelena Form  . COLONIC POLYPS, HX OF     Dr Earlean Shawl  . DJD (degenerative joint disease)   . Myocardial infarction 08/1999  . Angina      Past Surgical History  Procedure Laterality Date  . Coronary angioplasty with stent placement  2001; 07/30/11;    "2+3"  . Cardiac catheterization  ~ 2002  . Coronary angioplasty  07/30/11  . Cataract extraction w/ intraocular lens  implant, bilateral  ~ 2007    bilaterally  . Retinal detachment surgery  05/2008; 06/2008; 07/2008    left; "all 3 surgeries have failed"  . Inguinal hernia repair  2005    left  . Left heart catheterization with coronary angiogram N/A 07/30/2011    Procedure: LEFT HEART CATHETERIZATION WITH CORONARY ANGIOGRAM;  Surgeon: Burnell Blanks, MD;  Location: [facility] CATH LAB;  Service: Cardiovascular;  Laterality: N/A;  . Percutaneous coronary stent intervention (pci-s)  07/30/2011    Procedure: PERCUTANEOUS CORONARY STENT INTERVENTION (PCI-S);  Surgeon: Burnell Blanks, MD;  Location: [facility] CATH LAB;  Service: Cardiovascular;;    No Known Allergies  I have reviewed the patient's current medications     iohexol  Prior to Admission medications   Medication Sig Start Date End Date Taking? Authorizing Provider  DHA-EPA-VITAMIN E PO Take 2 tablets by mouth daily. 08/31/09  Yes Historical Provider, MD  vitamin E 1000  UNIT capsule Take 2,000 Units by mouth daily. 08/31/09  Yes Historical Provider, MD  aspirin 81 MG tablet Take 81 mg by mouth daily.      Historical Provider, MD  atenolol (TENORMIN) 50 MG tablet Take 1 tablet (50 mg total) by mouth daily. 11/15/14   Burnell Blanks, MD  atorvastatin (LIPITOR) 80 MG tablet TAKE 1 TABLET ONCE DAILY. 09/08/14   Burnell Blanks, MD  clopidogrel (PLAVIX) 75 MG tablet TAKE 1 TABLET EVERY DAY WITH BREAKFAST. 11/09/14    Burnell Blanks, MD  folic acid (FOLVITE) 1 MG tablet TAKE 1 TABLET ONCE DAILY. 05/25/13   Hendricks Limes, MD  Glucosamine HCl 1000 MG TABS Take by mouth daily.    Historical Provider, MD  Misc Natural Products (BLACK CHERRY CONCENTRATE PO) Take 400 mg by mouth daily. 2 BY MOUTH ONCE DAILY    Historical Provider, MD  NITROSTAT 0.4 MG SL tablet DISSOLVE 1 TABLET UNDER TONGUE AS NEEDED FOR CHEST PAIN,MAY REPEAT IN5 MINUTES FOR 2 DOSES.    Burnell Blanks, MD  Potassium Aminobenzoate (POTABA) 500 MG TABS Take 1 tablet by mouth daily.    Historical Provider, MD  Saw Palmetto, Serenoa repens, (SAW PALMETTO PO) Take 400 mg by mouth daily.    Historical Provider, MD  ZETIA 10 MG tablet TAKE (1) TABLET DAILY AT BEDTIME. 09/08/14   Burnell Blanks, MD     History   Social History  . Marital Status: Married    Spouse Name: N/A  . Number of Children: N/A  . Years of Education: N/A   Occupational History  . Not on file.   Social History Main Topics  . Smoking status: Former Smoker -- 0.50 packs/day for 40 years    Types: Cigarettes    Quit date: 06/12/1999  . Smokeless tobacco: Never Used  . Alcohol Use: No     Comment: 07/30/11 "recovering alcoholic; since 0/06/930"  . Drug Use: No  . Sexual Activity: Not Currently   Other Topics Concern  . Not on file   Social History Narrative    No family status information on file.   Family History  Problem Relation Age of Onset  . Colon cancer Father     in 83s  . Prostate cancer Father   . Alcohol abuse Mother      ROS:  Full 14 point review of systems complete and found to be negative unless listed above.  Physical Exam: Blood pressure 154/82, pulse 62, temperature 97.9 F (36.6 C), temperature source Oral, resp. rate 12, height 6' (1.829 m), weight 212 lb 15.4 oz (96.6 kg), SpO2 97 %.  General: Well developed, well nourished, male in no acute distress Head: Eyes PERRLA, No xanthomas. Normocephalic and atraumatic,  oropharynx without edema or exudate.  Lungs: Resp regular and unlabored, CTA. Heart:irregularly irregular rhythm no s3, s4, or murmurs..   Neck: No carotid bruits. No lymphadenopathy.  JVD. Abdomen: Bowel sounds present, abdomen soft and non-tender without masses or hernias noted. Msk:  No spine or cva tenderness. No weakness, no joint deformities or effusions. Extremities: No clubbing, cyanosis or edema. DP/PT/Radials 2+ and equal bilaterally. Neuro: Alert and oriented X 3. No focal deficits noted. Psych:  Good affect, responds appropriately Skin: No rashes or lesions noted.  Labs:   Lab Results  Component Value Date   WBC 6.1 11/29/2014   HGB 15.3 11/29/2014   HCT 45.0 11/29/2014   MCV 91.0 11/29/2014   PLT 199 11/29/2014  Recent Labs  11/29/14 1452  INR 1.06    Recent Labs Lab 11/29/14 1452 11/29/14 1457  NA 140 141  K 4.2 4.1  CL 110 105  CO2 20*  --   BUN 15 16  CREATININE 0.97 0.90  CALCIUM 9.3  --   PROT 6.4*  --   BILITOT 0.7  --   ALKPHOS 54  --   ALT 32  --   AST 24  --   GLUCOSE 100* 96  ALBUMIN 4.0  --     Recent Labs  11/29/14 1456  TROPIPOC 0.01   No results found for: PROBNP Lab Results  Component Value Date   CHOL 146 01/13/2014   HDL 55.90 01/13/2014   LDLCALC 78 01/13/2014   TRIG 63.0 01/13/2014   No results found for: DDIMER No results found for: LIPASE, AMYLASE TSH  Date/Time Value Ref Range Status  06/26/2011 08:13 AM 1.98 0.35 - 5.50 uIU/mL Final   No results found for: VITAMINB12, FOLATE, FERRITIN, TIBC, IRON, RETICCTPCT    ECG: afib at rate of 68.  Radiology:  Ct Head Wo Contrast  11/29/2014   CLINICAL DATA:  Code stroke. Initial encounter. Aphasia. Transient episode of aphasia with lightheadedness and tongue numbness.  EXAM: CT HEAD WITHOUT CONTRAST  TECHNIQUE: Contiguous axial images were obtained from the base of the skull through the vertex without intravenous contrast.  COMPARISON:  None.  FINDINGS: No mass  lesion, mass effect, midline shift, hydrocephalus, hemorrhage. No acute territorial cortical ischemia/infarct. Atrophy and chronic ischemic white matter disease is present. Tiny lacunar infarct is present posterior limb of the LEFT internal capsule adjacent to the thalamus. Intracranial atherosclerosis. Calvarium intact. Visible paranasal sinuses are within normal limits. LEFT vitreous replacement incidentally noted. Bilateral lens extractions.  IMPRESSION: 1. Atrophy and chronic ischemic white matter disease without acute intracranial abnormality. 2. Critical Value/emergent results were called by telephone at the time of interpretation on 11/29/2014 at 3:06 pm to Dr. Murvin Natal, who verbally acknowledged these results.   Electronically Signed   By: Dereck Ligas M.D.   On: 11/29/2014 15:07    ASSESSMENT AND PLAN:    Active Problems:   * No active hospital problems. * 1. New onset Afib - unknown duration.  CHADSVASC score of at least 5 (age, HTN, Vascular disease, TIA)  Asymptomatic.  - Will start eliquis 5mg  BID. Continue Atenolol 50mg . Discontinue Plavix and ASA. - Consider TTE. I think he will not need TEE as his stroke likely due to afib. Consider head MRI. - Plan to cardiovert in 4 weeks. Likely after he came back from cruise vacation. Prevent fall.   2. HTN - Relatively stable  3. HL -Continue Lipitor 80mg .  4. CAD - Asymptomatic. S/p 2001 BMT to RCA and mCX,, PCI of ostium of the first obtuse marginal coronary artery. Cath 2013 two non-overlapping DES in the RCA and PCI to small distal Circumflex.   5. TIA? - Per neurology  Signed: Rakim Moone, PA 11/29/2014, 3:55 PM Pager [phone]  Co-Sign MD

## 2014-11-29 NOTE — Consult Note (Addendum)
Referring Physician: Darl Householder    Chief Complaint: Code stroke  HPI:                                                                                                                                         Luis [name] is an 80 y.o. male with HTN, hyperlipidemia, and cardiac history who is on both ASA and Plavix. EMS reports that the patient had sudden onset difficulty with speech at 1420 in which he was unable to get his words out. This was witnessed by his wife and lasted 1-2 minutes with period of confusion then resolved. EMS was transporting the patient when he reported that his tongue became numb and he felt lightheaded. Code stroke activated. On arrival patient was noted to have left facial droop but per wife this is old.  Denies HA, vertigo, double vision, focal weakness or numbness, slurred speech, confusion, or vision impairment. CT brain was personally reviewed and showed no acute abnormality. CTA brain reviewed and showed no large artery occlusion.  Date last known well: Date: 11/29/2014 Time last known well: Time: 14:20  NIHSS: 0 tPA Given: No: symptoms resolved Modified Rankin: Rankin Score=0    Past Medical History  Diagnosis Date  . Unspecified essential hypertension   . HYPERLIPIDEMIA   . CORONARY ARTERY DISEASE     Plavix as of 07/30/11; Dr Angelena Form  . COLONIC POLYPS, HX OF     Dr Earlean Shawl  . DJD (degenerative joint disease)   . Myocardial infarction 08/1999  . Angina     Past Surgical History  Procedure Laterality Date  . Coronary angioplasty with stent placement  2001; 07/30/11;    "2+3"  . Cardiac catheterization  ~ 2002  . Coronary angioplasty  07/30/11  . Cataract extraction w/ intraocular lens  implant, bilateral  ~ 2007    bilaterally  . Retinal detachment surgery  05/2008; 06/2008; 07/2008    left; "all 3 surgeries have failed"  . Inguinal hernia repair  2005    left  . Left heart catheterization with coronary angiogram N/A 07/30/2011    Procedure: LEFT  HEART CATHETERIZATION WITH CORONARY ANGIOGRAM;  Surgeon: Burnell Blanks, MD;  [facility] C[facility] LAB;  Service: Cardiovascular;  Laterality: N/A;  . Percutaneous coronary stent intervention (pci-s)  07/30/2011    Procedure: PERCUTANEOUS CORONARY STENT INTERVENTION (PCI-S);  Surgeon: Burnell Blanks, MD;  Location: [facility] CATH LAB;  Service: Cardiovascular;;    Family History  Problem Relation Age of Onset  . Colon cancer Father     in 4s  . Prostate cancer Father   . Alcohol abuse Mother    Social History:  reports that he quit smoking about 15 years ago. His smoking use included Cigarettes. He has a 20 pack-year smoking history. He has never used smokeless tobacco. He reports that he does not drink alcohol or use illicit drugs. Family history: no  brain tumors, brain aneurysms, MS, or epilepsy Allergies: No Known Allergies  Medications:                                                                                                                           Current Facility-Administered Medications  Medication Dose Route Frequency Provider Last Rate Last Dose  . iohexol (OMNIPAQUE) 350 MG/ML injection 50 mL  50 mL Intravenous Once PRN Wandra Arthurs, MD       Current Outpatient Prescriptions  Medication Sig Dispense Refill  . DHA-EPA-VITAMIN E PO Take 2 tablets by mouth daily.    . vitamin E 1000 UNIT capsule Take 2,000 Units by mouth daily.    Marland Kitchen aspirin 81 MG tablet Take 81 mg by mouth daily.      Marland Kitchen atenolol (TENORMIN) 50 MG tablet Take 1 tablet (50 mg total) by mouth daily. 30 tablet 11  . atorvastatin (LIPITOR) 80 MG tablet TAKE 1 TABLET ONCE DAILY. 30 tablet 3  . clopidogrel (PLAVIX) 75 MG tablet TAKE 1 TABLET EVERY DAY WITH BREAKFAST. 30 tablet 6  . folic acid (FOLVITE) 1 MG tablet TAKE 1 TABLET ONCE DAILY. 30 tablet 0  . Glucosamine HCl 1000 MG TABS Take by mouth daily.    . Misc Natural Products (BLACK CHERRY CONCENTRATE PO) Take 400 mg by mouth daily. 2 BY MOUTH ONCE  DAILY    . NITROSTAT 0.4 MG SL tablet DISSOLVE 1 TABLET UNDER TONGUE AS NEEDED FOR CHEST PAIN,MAY REPEAT IN5 MINUTES FOR 2 DOSES. 25 tablet 0  . Potassium Aminobenzoate (POTABA) 500 MG TABS Take 1 tablet by mouth daily.    . Saw Palmetto, Serenoa repens, (SAW PALMETTO PO) Take 400 mg by mouth daily.    Marland Kitchen ZETIA 10 MG tablet TAKE (1) TABLET DAILY AT BEDTIME. 30 tablet 3     ROS:                                                                                                                                       History obtained from the patient  General ROS: negative for - chills, fatigue, fever, night sweats, weight gain or weight loss Psychological ROS: negative for - behavioral disorder, hallucinations, memory difficulties, mood swings or suicidal ideation Ophthalmic ROS: negative for - blurry vision, double vision, eye pain or loss of vision ENT ROS: negative for - epistaxis, nasal discharge,  oral lesions, sore throat, tinnitus or vertigo Allergy and Immunology ROS: negative for - hives or itchy/watery eyes Hematological and Lymphatic ROS: negative for - bleeding problems, bruising or swollen lymph nodes Endocrine ROS: negative for - galactorrhea, hair pattern changes, polydipsia/polyuria or temperature intolerance Respiratory ROS: negative for - cough, hemoptysis, shortness of breath or wheezing Cardiovascular ROS: negative for - chest pain, dyspnea on exertion, edema or irregular heartbeat Gastrointestinal ROS: negative for - abdominal pain, diarrhea, hematemesis, nausea/vomiting or stool incontinence Genito-Urinary ROS: negative for - dysuria, hematuria, incontinence or urinary frequency/urgency Musculoskeletal ROS: negative for - joint swelling or muscular weakness Neurological ROS: as noted in HPI Dermatological ROS: negative for rash and skin lesion changes  Physical Examination:                                                                                                       Weight 96.6 kg (212 lb 15.4 oz).  HEENT-  Normocephalic, no lesions, without obvious abnormality.  Normal external eye and conjunctiva.  Normal TM's bilaterally.  Normal auditory canals and external ears. Normal external nose, mucus membranes and septum.  Normal pharynx. Cardiovascular- irregularly irregular rhythm, pulses palpable throughout   Lungs- chest clear, no wheezing, rales, normal symmetric air entry Abdomen- normal findings: bowel sounds normal Extremities- no edema Lymph-no adenopathy palpable Musculoskeletal-no joint tenderness, deformity or swelling Skin-warm and dry, no hyperpigmentation, vitiligo, or suspicious lesions  Neurological Examination Mental Status: Alert, oriented, thought content appropriate.  Speech fluent without evidence of aphasia.  Able to follow 3 step commands without difficulty. Cranial Nerves: II: Discs flat bilaterally; Visual fields grossly normal, pupils equal, round, reactive to light and accommodation III,IV, VI: ptosis not present, extra-ocular motions intact bilaterally V,VII: smile symmetric with resting left facial droop--old, facial light touch sensation normal bilaterally VIII: hearing normal bilaterally IX,X: uvula rises symmetrically XI: bilateral shoulder shrug XII: midline tongue extension Motor: Right : Upper extremity   5/5    Left:     Upper extremity   5/5  Lower extremity   5/5     Lower extremity   5/5 Tone and bulk:normal tone throughout; no atrophy noted Sensory: Pinprick and light touch intact throughout, bilaterally Deep Tendon Reflexes: 1+ and symmetric throughout Plantars: Right: downgoing   Left: downgoing Cerebellar: normal finger-to-nose,and normal heel-to-shin test Gait: not tested due to safety       Lab Results: Basic Metabolic Panel:  Recent Labs Lab 11/29/14 1457  NA 141  K 4.1  CL 105  GLUCOSE 96  BUN 16  CREATININE 0.90    Liver Function Tests: No results for input(s): AST, ALT, ALKPHOS,  BILITOT, PROT, ALBUMIN in the last 168 hours. No results for input(s): LIPASE, AMYLASE in the last 168 hours. No results for input(s): AMMONIA in the last 168 hours.  CBC:  Recent Labs Lab 11/29/14 1452 11/29/14 1457  WBC 6.1  --   NEUTROABS 3.0  --   HGB 14.6 15.3  HCT 43.3 45.0  MCV 91.0  --   PLT 199  --     Cardiac Enzymes:  No results for input(s): CKTOTAL, CKMB, CKMBINDEX, TROPONINI in the last 168 hours.  Lipid Panel: No results for input(s): CHOL, TRIG, HDL, CHOLHDL, VLDL, LDLCALC in the last 168 hours.  CBG: No results for input(s): GLUCAP in the last 168 hours.  Microbiology: No results found for this or any previous visit.  Coagulation Studies:  Recent Labs  11/29/14 1452  LABPROT 14.0  INR 1.06    Imaging: Ct Head Wo Contrast  11/29/2014   CLINICAL DATA:  Code stroke. Initial encounter. Aphasia. Transient episode of aphasia with lightheadedness and tongue numbness.  EXAM: CT HEAD WITHOUT CONTRAST  TECHNIQUE: Contiguous axial images were obtained from the base of the skull through the vertex without intravenous contrast.  COMPARISON:  None.  FINDINGS: No mass lesion, mass effect, midline shift, hydrocephalus, hemorrhage. No acute territorial cortical ischemia/infarct. Atrophy and chronic ischemic white matter disease is present. Tiny lacunar infarct is present posterior limb of the LEFT internal capsule adjacent to the thalamus. Intracranial atherosclerosis. Calvarium intact. Visible paranasal sinuses are within normal limits. LEFT vitreous replacement incidentally noted. Bilateral lens extractions.  IMPRESSION: 1. Atrophy and chronic ischemic white matter disease without acute intracranial abnormality. 2. Critical Value/emergent results were called by telephone at the time of interpretation on 11/29/2014 at 3:06 pm to Dr. Murvin Natal, who verbally acknowledged these results.   Electronically Signed   By: Dereck Ligas M.D.   On: 11/29/2014 15:07        Assessment and plan discussed with with attending physician and they are in agreement.    Etta Quill PA-C [facility] [phone]  11/29/2014, 3:34 PM   Assessment: 80 y.o. male with transient language impairment and tongue numbness. This lasted for 2 minutes and resolved.  Exam is non-focal at this time and CT head/ CTA head and neck show no significant stenosis. tPA was not given as symptoms resolved. Given new onset Afib cannot rule out TIA/CVA.  Would also obtain EEG due to the transient confusional state.  Patient with new onset atrial fibrillation, probable TIA will make him CHA2-DS-VASc 5, and could benefit of starting a NOAC. Stroke team will follow up tomorrow.  Stroke Risk Factors - age, hyperlipidemia and hypertension  Recommend 1. HgbA1c, fasting lipid panel 2. MRI, MRA  of the brain without contrast 3. PT consult, OT consult, Speech consult 4. Echocardiogram 5. Carotid dopplers 6. Prophylactic therapy-ASA and Plavix home dose.  7. Risk factor modification 8. Telemetry monitoring 9. Frequent neuro checks 10 NPO until passes stroke swallow screen 11 EEG

## 2014-11-29 NOTE — ED Notes (Signed)
Pt in EEG. 

## 2014-11-29 NOTE — ED Notes (Signed)
Pt LSN [id number]. Pt was walking to wood shop in yard and suddenly felt "funny". Pt turned to talk with his wife and his tongue was numb. Pt unable to speak. Tongue numbness began to resolve. Pt had second episode with EMS of sudden lightheadedness and numbness. Pt was able to talk with EMS. BP 140/86, CBG 81, appears to be new onset AFib. HR 60-100. Pt has hx of heart cath with stent and on plavix for this.

## 2014-11-29 NOTE — H&P (Addendum)
[facility] History and Physical  KASHIF POOLER LNL:[id number] DOB: 09-Jan-1935 DOA: 11/29/2014  Referring physician: Dr. Shirlyn Goltz  PCP: Unice Cobble, MD   Cardiologist: Dr. Angelena Form  Chief Complaint: difficulty with speech and confusion at 14:20 witness by wife  HPI:  Pt is 80 yo male with known CAD, HTN, HLD, s/p bare metal stent in the right coronary artery in 2001 with an intervention to circumflex, cardiac cath in 07/2011 with placement of two non overlapping DES in the RCA, has been on aspirin and plavix, lives at home, presented to [facility] ED via EMS for evaluation of sudden onset of difficulty with speech that started at 14:20 prior to this admission and was associated with brief episode of confusion, lightheadedness and tongue numbness per wife who witnessed the event. Episode lasted 1-2 minutes and per EMS report, pt was noted to have left facial droop (per wife this is old). Pt otherwise denied chest pain or shortness of breath, no specific abd or urinary concerns, no other focal neurological symptoms.   In ED, pt noted to be hemodynamically stable, VSS, blood work unremarkable, imagining studies including CT brain and CTA brain with no acute abnormalities and no evidence of large artery occlusion. EKG showed evidence of a-fib rate controlled. Cardiology and neurology (Dr. Armida Sans) consulted by Dr. Darl Householder in ED. TR[facility] asked to admit for further evaluation and management of stroke.   Assessment and Plan: Principal Problem:   Slurred speech, light headedness  - in pt with known risk factors HTN, HLD, age, male sex, new onset a-fib, stroke work up initiated - tPA was not give as symptoms have resolved upon pt's arrival to the ED - appreciate neurology assistance - plan is to admit to neuro telemetry unit  - due to concern of possible seizure event, EEG was requested - following blood tests ordered: HgA1C, lipid panel - imaging studies: 2 D ECHO, carotic dopplers, MRI/MRA brain  without contrast  - will need PT/OT/SLP (all requested), advance diet once swallow evaluation done and pt passed  - ASA/Plavix discontinued and Eliquis started - neuro checks Q4 hours for now   Active Problems:   Atrial fibrillation, new onset - CHADSVASC score of at least 5 (age, HTN, Vascular disease, TIA) - pt started on Eliquis 5 mg BID PO per cardiology recommendations - discontinued Plavix and Aspirin as recommended per cardiology - 2 D ECHO requested as part of the stroke work up  - Plan to cardiovert in 4 weeks per cardiology    HLD (hyperlipidemia) - lipid panel requested - continue Lipitor 80 mg PO QD, Zetia on hold for now   HTN (hypertension) - stable on admission - continue Atenolol 50 mg PO QD as per home medical regimen    Coronary atherosclerosis - asymptomatic - continue atenolol as per home medical regimen 50 mg PO QD - Eliquis started and ASA/Plavix have been discontinued    DVT prophylaxis  - pt started on Eliquis    Consulting teams - Cardiology, Neurology  - admission note routed to PCP Dr. Linna Darner  Radiological Exams on Admission: Ct Angio Head/Neck W/cm &/or Wo Cm 11/29/2014   Suspected calcific flow reducing lesion LEFT supraclinoid ICA 75% or greater.  Moderate atheromatous change RIGHT supraclinoid ICA without definite flow reducing stenosis.  Moderate calcified and noncalcified plaque at the LEFT ICA origin, non stenotic. This could serve as a source of platelet emboli.   Ct Head Wo Contrast 11/29/2014  Atrophy and chronic ischemic white matter disease without  acute intracranial abnormality.  Code Status: Full Family Communication: Pt and wife at bedside Disposition Plan: Admit for further evaluation, telemetry bed requested   Mart Piggs [facility] [phone]   Review of Systems:  Constitutional: Negative for fever, chills and malaise/fatigue. Negative for diaphoresis.  HENT: Negative for hearing loss, ear pain, nosebleeds, congestion, sore throat, neck  pain, tinnitus and ear discharge.   Eyes: Negative for blurred vision, double vision, photophobia, pain, discharge and redness.  Respiratory: Negative for cough, hemoptysis, sputum production, shortness of breath, wheezing and stridor.   Cardiovascular: Negative for chest pain, palpitations, orthopnea, claudication and leg swelling.  Gastrointestinal: Negative for nausea, vomiting and abdominal pain.  Genitourinary: Negative for dysuria, urgency, frequency, hematuria and flank pain.  Musculoskeletal: Negative for myalgias, back pain, joint pain and falls.  Skin: Negative for itching and rash.  Neurological: Per HPI Endo/Heme/Allergies: Negative for environmental allergies and polydipsia. Does not bruise/bleed easily.  Psychiatric/Behavioral: Negative for suicidal ideas. The patient is not nervous/anxious.      Past Medical History  Diagnosis Date  . Unspecified essential hypertension   . HYPERLIPIDEMIA   . CORONARY ARTERY DISEASE     Plavix as of 07/30/11; Dr Angelena Form  . COLONIC POLYPS, HX OF     Dr Earlean Shawl  . DJD (degenerative joint disease)   . Myocardial infarction 08/1999  . Angina     Past Surgical History  Procedure Laterality Date  . Coronary angioplasty with stent placement  2001; 07/30/11;    "2+3"  . Cardiac catheterization  ~ 2002  . Coronary angioplasty  07/30/11  . Cataract extraction w/ intraocular lens  implant, bilateral  ~ 2007    bilaterally  . Retinal detachment surgery  05/2008; 06/2008; 07/2008    left; "all 3 surgeries have failed"  . Inguinal hernia repair  2005    left  . Left heart catheterization with coronary angiogram N/A 07/30/2011    Procedure: LEFT HEART CATHETERIZATION WITH CORONARY ANGIOGRAM;  Surgeon: Burnell Blanks, MD;  Location: [facility] CATH LAB;  Service: Cardiovascular;  Laterality: N/A;  . Percutaneous coronary stent intervention (pci-s)  07/30/2011    Procedure: PERCUTANEOUS CORONARY STENT INTERVENTION (PCI-S);  Surgeon: Burnell Blanks, MD;  Location: [facility] CATH LAB;  Service: Cardiovascular;;    Social History:  reports that he quit smoking about 15 years ago. His smoking use included Cigarettes. He has a 20 pack-year smoking history. He has never used smokeless tobacco. He reports that he does not drink alcohol or use illicit drugs.  No Known Allergies  Family History  Problem Relation Age of Onset  . Colon cancer Father     in 20s  . Prostate cancer Father   . Alcohol abuse Mother     Medication Sig  aspirin 81 MG tablet Take 81 mg by mouth daily.    atenolol (TENORMIN) 50 MG tablet Take 1 tablet (50 mg total) by mouth daily.  atorvastatin (LIPITOR) 80 MG tablet TAKE 1 TABLET ONCE DAILY.  clopidogrel (PLAVIX) 75 MG tablet TAKE 1 TABLET EVERY DAY   Cyanocobalamin (VITAMIN B-12 PO) Take 1 tablet by mouth daily.  DHA-EPA-VITAMIN E PO Take 2 tablets by mouth daily.  folic acid (FOLVITE) 1 MG tablet TAKE 1 TABLET ONCE DAILY.  Glucosamine HCl 1000 MG TABS Take by mouth daily.  MELATONIN PO Take 1 tablet by mouth at bedtime.  Potassium Aminobenzoate  Take 1 tablet by mouth daily.  vitamin E 1000 UNIT capsule Take 2,000 Units by mouth daily.  ZETIA  10 MG tablet TAKE (1) TABLET DAILY AT BEDTIME.    Physical Exam: Filed Vitals:   11/29/14 1511 11/29/14 1530 11/29/14 1536 11/29/14 1600  BP:  154/82 154/82 145/99  Pulse:  62    Temp:   97.9 F (36.6 C)   TempSrc:   Oral   Resp:  14 12 13   Height:   6' (1.829 m)   Weight: 96.6 kg (212 lb 15.4 oz)     SpO2:  96% 97% 98%    Physical Exam  Constitutional: Appears well-developed and well-nourished. No distress.  HENT: Normocephalic. External right and left ear normal. Oropharynx is clear and moist.  Eyes: Conjunctivae and EOM are normal. PERRLA, no scleral icterus.  Neck: Normal ROM. Neck supple. No JVD. No tracheal deviation. No thyromegaly.   CVS: Irregular rate and rhythm, no murmurs, no gallops, no carotid bruit.   Pulmonary: Effort and breath sounds  normal, no stridor, rhonchi, wheezes, rales.  Abdominal: Soft. BS +,  no distension, tenderness, rebound or guarding.  Musculoskeletal: Normal range of motion. No edema and no tenderness.  Lymphadenopathy: No lymphadenopathy noted, cervical, inguinal.  Neuro: A&O x 3, speech fluent with no aphasia, follows 3 step commands; CN II - XII intact except left facial droop (which is apparently chronic); upper and lower extremities strength 5/5 bilaterally with normal bulk and tone and with no evidence of atrophy: sensation intact throughout (pinprick and light touch); normal finger to nose and heal to shin  Skin: Skin is warm and dry. No rash noted. Not diaphoretic. No erythema. No pallor.  Psychiatric: Normal mood and affect. Behavior, judgment, thought content normal.   Labs on Admission:  Basic Metabolic Panel:  Recent Labs Lab 11/29/14 1452 11/29/14 1457  NA 140 141  K 4.2 4.1  CL 110 105  CO2 20*  --   GLUCOSE 100* 96  BUN 15 16  CREATININE 0.97 0.90  CALCIUM 9.3  --    Liver Function Tests:  Recent Labs Lab 11/29/14 1452  AST 24  ALT 32  ALKPHOS 54  BILITOT 0.7  PROT 6.4*  ALBUMIN 4.0   CBC:  Recent Labs Lab 11/29/14 1452 11/29/14 1457  WBC 6.1  --   NEUTROABS 3.0  --   HGB 14.6 15.3  HCT 43.3 45.0  MCV 91.0  --   PLT 199  --    CBG:  Recent Labs Lab 11/29/14 1531  GLUCAP 92   EKG: atrial fibrillation    If 7PM-7AM, please contact night-coverage [email or web address] Password [email or web address] 11/29/2014, 5:14 PM

## 2014-11-29 NOTE — Procedures (Signed)
ELECTROENCEPHALOGRAM REPORT   Patient: Luis [name]       Room #: B15 EEG No. ID: [id number] Age: 80 y.o.        Sex: male Referring Physician: Doyle Askew Report Date:  11/29/2014        Interpreting Physician: Alexis Goodell  History: Luis [name] is an 80 y.o. male with sudden onset of difficulty with speech  Medications:  Scheduled: .  stroke: mapping our early stages of recovery book   Does not apply Once  . apixaban  5 mg Oral BID  . atenolol  50 mg Oral Daily  . atorvastatin  80 mg Oral Daily    Conditions of Recording:  This is a 16 channel EEG carried out with the patient in the awake and drowsy states.  Description:  The waking background activity consists of a low voltage, symmetrical, fairly well organized, 10 Hz alpha activity, seen from the parieto-occipital and posterior temporal regions.  Low voltage fast activity, poorly organized, is seen anteriorly and is at times superimposed on more posterior regions.  A mixture of theta and alpha rhythms are seen from the central and temporal regions. The patient drowses with slowing to irregular, low voltage theta and beta activity.   Intermittently during the recording is noted short periods of slowing over the left hemisphere characterized by an underlying polymorphic delta activity.  No epileptiform activity is noted.   Stage II sleep is not obtained. Hyperventilation was not performed.  Intermittent photic stimulation was performed but failed to illicit any change in the tracing.   IMPRESSION: This is an abnormal electroencephalogram secondary to intermittent slowing over the left hemisphere.  This finding may represent a focal abnormality in that region.  Clinical correlation recommended.     Alexis Goodell, MD [facility] [phone] 11/29/2014, 5:57 PM

## 2014-11-29 NOTE — ED Notes (Signed)
Pt awake, alert, airway clear

## 2014-11-29 NOTE — ED Provider Notes (Signed)
CSN: [id number]     Arrival date & time 11/29/14  1448 History   First MD Initiated Contact with Patient 11/29/14 1511     Chief Complaint  Patient presents with  . Code Stroke     (Consider location/radiation/quality/duration/timing/severity/associated sxs/prior Treatment) The history is provided by the patient, the spouse and the EMS personnel. The history is limited by the condition of the patient.  Luis [name] is a 80 y.o. male hx of CAD s/p stent, here with possible stroke. Around 2:20pm, he was walking and suddenly felt funny. He talked to his wife and his tongue was numb and he was unable to speak for several minutes. Symptoms improved after EMS got him. Did feel lightheadedness with EMS. EMS activated code stroke and noticed that he has new onset afib.    Level V caveat- condition of patient   Past Medical History  Diagnosis Date  . Unspecified essential hypertension   . HYPERLIPIDEMIA   . CORONARY ARTERY DISEASE     Plavix as of 07/30/11; Dr Angelena Form  . COLONIC POLYPS, HX OF     Dr Earlean Shawl  . DJD (degenerative joint disease)   . Myocardial infarction 08/1999  . Angina    Past Surgical History  Procedure Laterality Date  . Coronary angioplasty with stent placement  2001; 07/30/11;    "2+3"  . Cardiac catheterization  ~ 2002  . Coronary angioplasty  07/30/11  . Cataract extraction w/ intraocular lens  implant, bilateral  ~ 2007    bilaterally  . Retinal detachment surgery  05/2008; 06/2008; 07/2008    left; "all 3 surgeries have failed"  . Inguinal hernia repair  2005    left  . Left heart catheterization with coronary angiogram N/A 07/30/2011    Procedure: LEFT HEART CATHETERIZATION WITH CORONARY ANGIOGRAM;  Surgeon: Burnell Blanks, MD;  Location[facility] C[facility] LAB;  Service: Cardiovascular;  Laterality: N/A;  . Percutaneous coronary stent intervention (pci-s)  07/30/2011    Procedure: PERCUTANEOUS CORONARY STENT INTERVENTION (PCI-S);  Surgeon: Burnell Blanks,  MD;  Location: [facility] CATH LAB;  Service: Cardiovascular;;   Family History  Problem Relation Age of Onset  . Colon cancer Father     in 82s  . Prostate cancer Father   . Alcohol abuse Mother    History  Substance Use Topics  . Smoking status: Former Smoker -- 0.50 packs/day for 40 years    Types: Cigarettes    Quit date: 06/12/1999  . Smokeless tobacco: Never Used  . 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  All other systems reviewed and are negative.     Allergies  Review of patient's allergies indicates no known allergies.  Home Medications   Prior to Admission medications   Medication Sig Start Date End Date Taking? Authorizing Provider  aspirin 81 MG tablet Take 81 mg by mouth daily.     Yes Historical Provider, MD  atenolol (TENORMIN) 50 MG tablet Take 1 tablet (50 mg total) by mouth daily. 11/15/14  Yes Burnell Blanks, MD  atorvastatin (LIPITOR) 80 MG tablet TAKE 1 TABLET ONCE DAILY. 09/08/14  Yes Burnell Blanks, MD  clopidogrel (PLAVIX) 75 MG tablet TAKE 1 TABLET EVERY DAY WITH BREAKFAST. 11/09/14  Yes Burnell Blanks, MD  Cyanocobalamin (VITAMIN B-12 PO) Take 1 tablet by mouth daily.   Yes Historical Provider, MD  [facility] E  PO Take 2 tablets by mouth daily. 08/31/09  Yes Historical Provider, MD  folic acid (FOLVITE) 1 MG tablet TAKE 1 TABLET ONCE DAILY. 05/25/13  Yes Hendricks Limes, MD  Glucosamine HCl 1000 MG TABS Take by mouth daily.   Yes Historical Provider, MD  Ibuprofen-Diphenhydramine HCl (ADVIL PM) 200-25 MG CAPS Take 1 tablet by mouth daily.   Yes Historical Provider, MD  MELATONIN PO Take 1 tablet by mouth at bedtime.   Yes Historical Provider, MD  Misc Natural Products (BLACK CHERRY CONCENTRATE PO) Take 400 mg by mouth daily. 2 BY MOUTH ONCE DAILY   Yes Historical Provider, MD  NITROSTAT 0.4 MG  SL tablet DISSOLVE 1 TABLET UNDER TONGUE AS NEEDED FOR CHEST PAIN,MAY REPEAT IN5 MINUTES FOR 2 DOSES.   Yes Burnell Blanks, MD  Potassium Aminobenzoate (POTABA) 500 MG TABS Take 1 tablet by mouth daily.   Yes Historical Provider, MD  Saw Palmetto, Serenoa repens, (SAW PALMETTO PO) Take 400 mg by mouth daily.   Yes Historical Provider, MD  vitamin E 1000 UNIT capsule Take 2,000 Units by mouth daily. 08/31/09  Yes Historical Provider, MD  ZETIA 10 MG tablet TAKE (1) TABLET DAILY AT BEDTIME. 09/08/14  Yes Burnell Blanks, MD   BP 145/99 mmHg  Pulse 62  Temp(Src) 97.9 F (36.6 C) (Oral)  Resp 13  Ht 6' (1.829 m)  Wt 212 lb 15.4 oz (96.6 kg)  BMI 28.88 kg/m2  SpO2 98% Physical Exam  Constitutional: He is oriented to person, place, and time. He appears well-developed.  HENT:  Head: Normocephalic.  Mouth/Throat: Oropharynx is clear and moist.  Eyes: Conjunctivae are normal. Pupils are equal, round, and reactive to light.  Neck: Normal range of motion. Neck supple.  Cardiovascular: Normal rate and normal heart sounds.   Irregular   Pulmonary/Chest: Effort normal and breath sounds normal. No respiratory distress. He has no wheezes. He has no rales.  Abdominal: Soft. Bowel sounds are normal. He exhibits no distension. There is no tenderness. There is no rebound.  Musculoskeletal: Normal range of motion. He exhibits no edema or tenderness.  Neurological: He is alert and oriented to person, place, and time.  Skin: Skin is warm and dry.  Psychiatric: He has a normal mood and affect. His behavior is normal. Judgment and thought content normal.  Nursing note and vitals reviewed.   ED Course  Procedures (including critical care time) Labs Review Labs Reviewed  COMPREHENSIVE METABOLIC PANEL - Abnormal; Notable for the following:    CO2 20 (*)    Glucose, Bld 100 (*)    Total Protein 6.4 (*)    All other components within normal limits  ETHANOL  PROTIME-INR  APTT  CBC   DIFFERENTIAL  URINE RAPID DRUG SCREEN, HOSP PERFORMED  URINALYSIS, ROUTINE W REFLEX MICROSCOPIC (NOT AT [facility])  I-STAT CHEM 8, ED  I-STAT TROPOININ, ED  CBG MONITORING, ED    Imaging Review Ct Angio Head W/cm &/or Wo Cm  11/29/2014   CLINICAL DATA:  80 year old diabetic male with transient alteration of mental status and word-finding difficulties lasting for 2 minutes, now resolved. New onset of atrial fibrillation. Initial encounter.  EXAM: CT ANGIOGRAPHY HEAD AND NECK  TECHNIQUE: Multidetector CT imaging of the head and neck was performed using the standard protocol during bolus administration of intravenous contrast. Multiplanar CT image reconstructions and MIPs were obtained to evaluate the vascular anatomy. Carotid stenosis measurements (when applicable) are obtained utilizing NASCET criteria, using the distal internal carotid diameter  as the denominator.  CONTRAST:  Omnipaque 350, 50 mL.  COMPARISON:  CT head earlier today.  FINDINGS: CT HEAD  Calvarium and skull base: No fracture or destructive lesion. Mastoids and middle ears are grossly clear.  Paranasal sinuses: Imaged portions are clear.  Orbits: No acute findings.  LEFT vitreous replacement.  Brain: No evidence of acute abnormality, including acute infarct, hemorrhage, hydrocephalus, or mass lesion. Small remote infarct posterior limb internal capsule, LEFT.  CTA NECK  Aortic arch: Standard branching. Imaged portion shows no evidence of aneurysm or dissection. No significant stenosis of the major arch vessel origins.  Right carotid system: Minimal calcific plaque at the bifurcation. No evidence of dissection, stenosis (50% or greater) or occlusion.  Left carotid system: Moderate calcific and non-calcified plaque at the LEFT ICA origin. No evidence of dissection, stenosis (50% or greater) or occlusion.  Vertebral arteries: Codominant. No evidence of dissection, stenosis (50% or greater) or occlusion.  CTA HEAD  Anterior circulation: BILATERAL  calcific atheromatous change in the cavernous and supraclinoid segments. As seen on coronal series 403 image 87, suspected 50-75% supraclinoid RIGHT ICA stenosis. Suspected 75% or greater LEFT ICA supraclinoid stenosis. ICA termini widely patent. No proximal stenosis of the anterior or middle cerebral arteries. No MCA branch occlusion. No intracranial aneurysm.  Posterior circulation: No significant stenosis, proximal occlusion, aneurysm, or vascular malformation.  Venous sinuses: As permitted by contrast timing, patent.  Anatomic variants: None of significance.  Delayed phase:   No abnormal intracranial enhancement.  IMPRESSION: Suspected calcific flow reducing lesion LEFT supraclinoid ICA 75% or greater.  Moderate atheromatous change RIGHT supraclinoid ICA without definite flow reducing stenosis.  Moderate calcified and noncalcified plaque at the LEFT ICA origin, non stenotic. This could serve as a source of platelet emboli.  I personally discussed the findings with the attending neurologist at 4:10 p.m.   Electronically Signed   By: Staci Righter M.D.   On: 11/29/2014 16:16   Ct Head Wo Contrast  11/29/2014   CLINICAL DATA:  Code stroke. Initial encounter. Aphasia. Transient episode of aphasia with lightheadedness and tongue numbness.  EXAM: CT HEAD WITHOUT CONTRAST  TECHNIQUE: Contiguous axial images were obtained from the base of the skull through the vertex without intravenous contrast.  COMPARISON:  None.  FINDINGS: No mass lesion, mass effect, midline shift, hydrocephalus, hemorrhage. No acute territorial cortical ischemia/infarct. Atrophy and chronic ischemic white matter disease is present. Tiny lacunar infarct is present posterior limb of the LEFT internal capsule adjacent to the thalamus. Intracranial atherosclerosis. Calvarium intact. Visible paranasal sinuses are within normal limits. LEFT vitreous replacement incidentally noted. Bilateral lens extractions.  IMPRESSION: 1. Atrophy and chronic  ischemic white matter disease without acute intracranial abnormality. 2. Critical Value/emergent results were called by telephone at the time of interpretation on 11/29/2014 at 3:06 pm to Dr. Murvin Natal, who verbally acknowledged these results.   Electronically Signed   By: Dereck Ligas M.D.   On: 11/29/2014 15:07   Ct Angio Neck W/cm &/or Wo/cm  11/29/2014   CLINICAL DATA:  80 year old diabetic male with transient alteration of mental status and word-finding difficulties lasting for 2 minutes, now resolved. New onset of atrial fibrillation. Initial encounter.  EXAM: CT ANGIOGRAPHY HEAD AND NECK  TECHNIQUE: Multidetector CT imaging of the head and neck was performed using the standard protocol during bolus administration of intravenous contrast. Multiplanar CT image reconstructions and MIPs were obtained to evaluate the vascular anatomy. Carotid stenosis measurements (when applicable) are obtained utilizing NASCET criteria, using  the distal internal carotid diameter as the denominator.  CONTRAST:  Omnipaque 350, 50 mL.  COMPARISON:  CT head earlier today.  FINDINGS: CT HEAD  Calvarium and skull base: No fracture or destructive lesion. Mastoids and middle ears are grossly clear.  Paranasal sinuses: Imaged portions are clear.  Orbits: No acute findings.  LEFT vitreous replacement.  Brain: No evidence of acute abnormality, including acute infarct, hemorrhage, hydrocephalus, or mass lesion. Small remote infarct posterior limb internal capsule, LEFT.  CTA NECK  Aortic arch: Standard branching. Imaged portion shows no evidence of aneurysm or dissection. No significant stenosis of the major arch vessel origins.  Right carotid system: Minimal calcific plaque at the bifurcation. No evidence of dissection, stenosis (50% or greater) or occlusion.  Left carotid system: Moderate calcific and non-calcified plaque at the LEFT ICA origin. No evidence of dissection, stenosis (50% or greater) or occlusion.  Vertebral arteries:  Codominant. No evidence of dissection, stenosis (50% or greater) or occlusion.  CTA HEAD  Anterior circulation: BILATERAL calcific atheromatous change in the cavernous and supraclinoid segments. As seen on coronal series 403 image 87, suspected 50-75% supraclinoid RIGHT ICA stenosis. Suspected 75% or greater LEFT ICA supraclinoid stenosis. ICA termini widely patent. No proximal stenosis of the anterior or middle cerebral arteries. No MCA branch occlusion. No intracranial aneurysm.  Posterior circulation: No significant stenosis, proximal occlusion, aneurysm, or vascular malformation.  Venous sinuses: As permitted by contrast timing, patent.  Anatomic variants: None of significance.  Delayed phase:   No abnormal intracranial enhancement.  IMPRESSION: Suspected calcific flow reducing lesion LEFT supraclinoid ICA 75% or greater.  Moderate atheromatous change RIGHT supraclinoid ICA without definite flow reducing stenosis.  Moderate calcified and noncalcified plaque at the LEFT ICA origin, non stenotic. This could serve as a source of platelet emboli.  I personally discussed the findings with the attending neurologist at 4:10 p.m.   Electronically Signed   By: Staci Righter M.D.   On: 11/29/2014 16:16     EKG Interpretation   Date/Time:  Monday November 29 2014 15:29:34 EDT Ventricular Rate:  68 PR Interval:    QRS Duration: 91 QT Interval:  447 QTC Calculation: 475 R Axis:   22 Text Interpretation:  Atrial fibrillation Inferior infarct, old afib new  since previous Confirmed by Diamante Truszkowski  MD, Shauntell Iglesia ([id number]) on 11/29/2014 3:38:31 PM      MDM   Final diagnoses:  CVA (cerebral infarction)  CVA (cerebral infarction)    Luis [name] is a 80 y.o. male here with tongue numbness, trouble speaking. Concerned for TIA. Neuro at bedside. Since symptoms improving won't give TPA. Will get CT angio to r/o dissection. Will have cardiology evaluate for new onset afib, likely causing his symptoms.   4:42 PM CT angio  showed no dissection but has carotid stenosis. Neuro plan on EEG. Cardiology consulted. Will admit to tele.    Wandra Arthurs, MD 11/29/14 [phone]

## 2014-11-29 NOTE — Progress Notes (Signed)
EEG Completed; Results Pending  

## 2014-11-29 NOTE — Code Documentation (Signed)
80yo male arriving to [facility] via [place] at 82.  EMS reports that the patient had sudden onset difficulty with speech at 1420 in which he was unable to get his words out.  This was witnessed by his wife and lasted 1-2 minutes and resolved.  EMS was transporting the patient when he reported that his tongue became numb and he felt lightheaded.  Code stroke activated.  Stroke team at the bedside on arrival.  Labs drawn and patient cleared by Dr. Tamera Punt.  Patient to CT.  CT head completed.  NIHSS 0, see documentation for details and code stroke times.  Patient is back to baseline.  CTA head and neck completed per MD.  Of note, patient s/p stents on Plavix and ASA.  Patient in atrial fibrillation with no known or documented diagnosis.  Dr. Armida Sans at the bedside.  No acute stroke treatment at this time.  TIA alert.  Bedside handoff with ED RN Hope.

## 2014-11-30 ENCOUNTER — Inpatient Hospital Stay (HOSPITAL_COMMUNITY): Payer: Medicare HMO

## 2014-11-30 DIAGNOSIS — I4891 Unspecified atrial fibrillation: Secondary | ICD-10-CM

## 2014-11-30 DIAGNOSIS — R4781 Slurred speech: Secondary | ICD-10-CM

## 2014-11-30 DIAGNOSIS — I2510 Atherosclerotic heart disease of native coronary artery without angina pectoris: Secondary | ICD-10-CM

## 2014-11-30 DIAGNOSIS — I639 Cerebral infarction, unspecified: Secondary | ICD-10-CM

## 2014-11-30 DIAGNOSIS — G459 Transient cerebral ischemic attack, unspecified: Principal | ICD-10-CM

## 2014-11-30 LAB — BASIC METABOLIC PANEL
Anion gap: 6 (ref 5–15)
BUN: 13 mg/dL (ref 6–20)
CHLORIDE: 107 mmol/L (ref 101–111)
CO2: 27 mmol/L (ref 22–32)
Calcium: 9.2 mg/dL (ref 8.9–10.3)
Creatinine, Ser: 1 mg/dL (ref 0.61–1.24)
GFR calc Af Amer: >60 mL/min (ref >60)
GFR calc non Af Amer: >60 mL/min (ref >60)
Glucose, Bld: 100 mg/dL — ABNORMAL HIGH (ref 65–99)
Potassium: 4.1 mmol/L (ref 3.5–5.1)
Sodium: 140 mmol/L (ref 135–145)

## 2014-11-30 LAB — CBC
HEMATOCRIT: 41.6 % (ref 39.0–52.0)
HEMOGLOBIN: 14.3 g/dL (ref 13.0–17.0)
MCH: 30.9 pg (ref 26.0–34.0)
MCHC: 34.4 g/dL (ref 30.0–36.0)
MCV: 89.8 fL (ref 78.0–100.0)
Platelets: 182 10*3/uL (ref 150–400)
RBC: 4.63 MIL/uL (ref 4.22–5.81)
RDW: 13.7 % (ref 11.5–15.5)
WBC: 6.3 10*3/uL (ref 4.0–10.5)

## 2014-11-30 LAB — LIPID PANEL
Cholesterol: 153 mg/dL (ref 0–200)
HDL: 47 mg/dL (ref >40)
LDL CALC: 92 mg/dL (ref 0–99)
TRIGLYCERIDES: 72 mg/dL (ref <150)
Total CHOL/HDL Ratio: 3.3 RATIO
VLDL: 14 mg/dL (ref 0–40)

## 2014-11-30 MED ORDER — ACETAMINOPHEN 325 MG PO TABS
650.0000 mg | ORAL_TABLET | ORAL | Status: DC | PRN
  Administered 2014-11-30: 650 mg via ORAL
  Filled 2014-11-30: qty 2

## 2014-11-30 MED ORDER — DIAZEPAM 5 MG PO TABS: 5.0000 mg | ORAL_TABLET | Freq: Once | ORAL | Status: DC

## 2014-11-30 MED ORDER — APIXABAN 5 MG PO TABS: 5.0000 mg | ORAL_TABLET | Freq: Two times a day (BID) | ORAL | Status: DC

## 2014-11-30 NOTE — Progress Notes (Signed)
Preliminary results by tech - Carotid Duplex Completed. Mild plaque visualized bilaterally in the proximal internal carotid arteries with no significant stenosis noted. Oda Cogan, BS, RDMS, RVT

## 2014-11-30 NOTE — Evaluation (Signed)
Physical Therapy Evaluation and discharge Patient Details Name: Luis [name] MRN: [id number] DOB: July 25, 1934 Today's Date: 11/30/2014   History of Present Illness  Pt is 80 yo male with known CAD, HTN, HLD, s/p bare metal stent in the right coronary artery in 2001 with an intervention to circumflex, cardiac cath in 07/2011 with placement of two non overlapping DES in the RCA, has been on aspirin and plavix, lives at home, presented to [facility] ED via EMS for evaluation of sudden onset of difficulty with speech that started at 14:20 prior to this admission and was associated wPt is 80 yo male with known CAD, HTN, HLD, s/p bare metal stent in the right coronary artery in 2001 with an intervention to circumflex, cardiac cath in 07/2011 with placement of two non overlapping DES in the RCA, has been on aspirin and plavix, lives at home, presented to [facility] ED via EMS for evaluation of sudden onset of difficulty with speech that started at 14:20 prior to this admission and was associated with brief episode of confusion, lightheadedness and tongue numbness per wife who witnessed the event. Episode lasted 1-2 minutes and per EMS report, pt was noted to have left facial droop (per wife this is old). ith brief episode of confusion, lightheadedness and tongue numbness per wife who witnessed the event. Episode lasted 1-2 minutes and per EMS report, pt was noted to have left facial droop (per wife this is old).   Clinical Impression  Pt is I with all aspects of mobility including head turns, head nods, start/stops, and quick turns.  No skilled PT needs identified and will d/c at this time.    Follow Up Recommendations No PT follow up    Equipment Recommendations       Recommendations for Other Services       Precautions / Restrictions        Mobility  Bed Mobility Overal bed mobility: Independent                Transfers Overall transfer level: Independent                   Ambulation/Gait Ambulation/Gait assistance: Independent Ambulation Distance (Feet): 300 Feet Assistive device: None Gait Pattern/deviations: Step-through pattern     General Gait Details: Pt I with starts/stops, head turns, nods, and quick turns  Science writer    Modified Rankin (Stroke Patients Only)       Balance Overall balance assessment: No apparent balance deficits (not formally assessed)                                           Pertinent Vitals/Pain Pain Assessment: No/denies pain    Home Living Family/patient expects to be discharged to:: Private residence Living Arrangements: Spouse/significant other   Type of Home: House Home Access: Stairs to enter   [employer] of Steps: 1 Home Layout: Multi-level;Able to live on main level with bedroom/bathroom Home Equipment: None      Prior Function Level of Independence: Independent         Comments: 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    Exercises        Assessment/Plan    PT Assessment Patent does not need any further PT services  PT Diagnosis Difficulty walking   PT Problem List    PT Treatment Interventions     PT Goals (Current goals can be found in the Care Plan section) Acute Rehab PT Goals Patient Stated Goal: To  go home PT Goal Formulation: All assessment and education complete, DC therapy    Frequency     Barriers to discharge        Co-evaluation               End of Session Equipment Utilized During Treatment: Gait belt Activity Tolerance: Patient tolerated treatment well Patient left: in chair;with family/visitor present;with call bell/phone within reach Nurse Communication: Mobility status         Time: 9741-6384 PT Time Calculation (min) (ACUTE ONLY): 18 min   Charges:   PT Evaluation $Initial PT Evaluation Tier I: 1 Procedure     PT G Codes:        Luis [name] 11/30/2014, 12:13 PM

## 2014-11-30 NOTE — Progress Notes (Signed)
UR Completed Shatina Streets Graves-Bigelow, RN,BSN [phone]  

## 2014-11-30 NOTE — Discharge Summary (Signed)
Physician Discharge Summary  Luis [name]:[id number] DOB: 1934-11-07 DOA: 11/29/2014  PCP: Unice Cobble, MD  Admit date: 11/29/2014 Discharge date: 11/30/2014  Time spent: 25 minutes  Recommendations for Outpatient Follow-up:  1. Discharge home with outpatient cardiology follow-up in 2 weeks. Plan on outpatient Holter monitoring. 2. Patient provided with untied information to obtain a new PCP in the community. 3. Patient being started on eliquis.  Discharge Diagnoses:  Principal Problem:   TIA (transient ischemic attack)  Active Problems:   Atrial fibrillation, new onset   HLD (hyperlipidemia)   Coronary atherosclerosis   Slurred speech   HTN (hypertension)   Discharge Condition: Fair  Diet recommendation: Heart healthy  Filed Weights   11/29/14 1511 11/30/14 0532  Weight: 96.6 kg (212 lb 15.4 oz) 92.715 kg (204 lb 6.4 oz)    History of present illness:  80 yo male with known CAD, HTN, HLD, s/p bare metal stent in the right coronary artery in 2001 with an intervention to circumflex, cardiac cath in 07/2011 with placement of two non overlapping DES in the RCA, has been on aspirin and plavix, from home, presented to [facility] ED via EMS for evaluation of sudden onset of difficulty with speech on the day of admission. This was  associated with brief episode of confusion, lightheadedness and tongue numbness per wife who witnessed the event. Episode lasted 1-2 minutes and per EMS report, pt was noted to have left facial droop (per wife this is old). Pt otherwise denied chest pain or shortness of breath, no specific abd or urinary concerns, no other focal neurological symptoms.   In ED, pt noted to be hemodynamically stable, , blood work unremarkable, imagining studies including CT brain and CTA brain with no acute abnormalities and no evidence of large artery occlusion. EKG showed evidence of new onset rate controlled A. fib.. Cardiology and neurology (Dr. Armida Sans) consulted by ED  physician. TR[facility] asked to admit for further evaluation and management of TIA/CVA  Hospital Course:  TIA Symptoms resolved upon admission. On aspirin and Plavix at home. Given new onset A. fib with high CHADSvasc score of 6, patient switched to Eliquis.  CT angiogram of the head and neck done with atheromatous changes without significant stenosis. Patient could not tolerate MRI brain and was aborted. -EEG negative for seizure activity. 2-D echo done with normal EF and no wall motion abnormality. Carotid Doppler with nonsignificant stenosis. -Spoke with stroke team who recommended to continue Eliquis and statin and no further workup.  New-onset A. fib Patient on atenolol recently increased dose by his cardiologist. Echo with normal EF. Being started on Eliquis. Patient stable on telemetry. Cardiogenic pain on outpatient Holter monitoring.  Dyslipidemia Continue statin and Zetia    Procedures:  CT head, angiogram of head and neck  EEG  2-D echo  Carotid Doppler  Consultations:  Cardiology  Neurology  Discharge Exam: Filed Vitals:   11/30/14 0725  BP: 136/81  Pulse: 56  Temp: 98.3 F (36.8 C)  Resp: 12    General: Elderly male in no acute distress HEENT: No pallor, moist oral mucosa, left facial droop (chronic), supple neck Chest: Clear to auscultation bilaterally CVS: Normal S1 and S2, no murmurs GI: Soft, nondistended, nontender, bowel sounds present  musculoskeletal: Warm, no edema CNS: alert and oriented, nonfocal   Discharge Instructions    Current Discharge Medication List    START taking these medications   Details  apixaban (ELIQUIS) 5 MG TABS tablet Take 1 tablet (5 mg total)  by mouth 2 (two) times daily. Qty: 60 tablet, Refills: 0      CONTINUE these medications which have NOT CHANGED   Details  atenolol (TENORMIN) 50 MG tablet Take 1 tablet (50 mg total) by mouth daily. Qty: 30 tablet, Refills: 11    atorvastatin (LIPITOR) 80 MG tablet TAKE 1  TABLET ONCE DAILY. Qty: 30 tablet, Refills: 3    Cyanocobalamin (VITAMIN B-12 PO) Take 1 tablet by mouth daily.    DHA-EPA-VITAMIN E PO Take 2 tablets by mouth daily.    folic acid (FOLVITE) 1 MG tablet TAKE 1 TABLET ONCE DAILY. Qty: 30 tablet, Refills: 0    Glucosamine HCl 1000 MG TABS Take by mouth daily.    Ibuprofen-Diphenhydramine HCl (ADVIL PM) 200-25 MG CAPS Take 1 tablet by mouth daily.    MELATONIN PO Take 1 tablet by mouth at bedtime.    Misc Natural Products (BLACK CHERRY CONCENTRATE PO) Take 400 mg by mouth daily. 2 BY MOUTH ONCE DAILY    NITROSTAT 0.4 MG SL tablet DISSOLVE 1 TABLET UNDER TONGUE AS NEEDED FOR CHEST PAIN,MAY REPEAT IN5 MINUTES FOR 2 DOSES. Qty: 25 tablet, Refills: 0    Potassium Aminobenzoate (POTABA) 500 MG TABS Take 1 tablet by mouth daily.    Saw Palmetto, Serenoa repens, (SAW PALMETTO PO) Take 400 mg by mouth daily.    vitamin E 1000 UNIT capsule Take 2,000 Units by mouth daily.    ZETIA 10 MG tablet TAKE (1) TABLET DAILY AT BEDTIME. Qty: 30 tablet, Refills: 3      STOP taking these medications     aspirin 81 MG tablet      clopidogrel (PLAVIX) 75 MG tablet        No Known Allergies    The results of significant diagnostics from this hospitalization (including imaging, microbiology, ancillary and laboratory) are listed below for reference.    Significant Diagnostic Studies: Ct Angio Head W/cm &/or Wo Cm  11/29/2014   CLINICAL DATA:  80 year old diabetic male with transient alteration of mental status and word-finding difficulties lasting for 2 minutes, now resolved. New onset of atrial fibrillation. Initial encounter.  EXAM: CT ANGIOGRAPHY HEAD AND NECK  TECHNIQUE: Multidetector CT imaging of the head and neck was performed using the standard protocol during bolus administration of intravenous contrast. Multiplanar CT image reconstructions and MIPs were obtained to evaluate the vascular anatomy. Carotid stenosis measurements (when  applicable) are obtained utilizing NASCET criteria, using the distal internal carotid diameter as the denominator.  CONTRAST:  Omnipaque 350, 50 mL.  COMPARISON:  CT head earlier today.  FINDINGS: CT HEAD  Calvarium and skull base: No fracture or destructive lesion. Mastoids and middle ears are grossly clear.  Paranasal sinuses: Imaged portions are clear.  Orbits: No acute findings.  LEFT vitreous replacement.  Brain: No evidence of acute abnormality, including acute infarct, hemorrhage, hydrocephalus, or mass lesion. Small remote infarct posterior limb internal capsule, LEFT.  CTA NECK  Aortic arch: Standard branching. Imaged portion shows no evidence of aneurysm or dissection. No significant stenosis of the major arch vessel origins.  Right carotid system: Minimal calcific plaque at the bifurcation. No evidence of dissection, stenosis (50% or greater) or occlusion.  Left carotid system: Moderate calcific and non-calcified plaque at the LEFT ICA origin. No evidence of dissection, stenosis (50% or greater) or occlusion.  Vertebral arteries: Codominant. No evidence of dissection, stenosis (50% or greater) or occlusion.  CTA HEAD  Anterior circulation: BILATERAL calcific atheromatous change in the cavernous and  supraclinoid segments. As seen on coronal series 403 image 87, suspected 50-75% supraclinoid RIGHT ICA stenosis. Suspected 75% or greater LEFT ICA supraclinoid stenosis. ICA termini widely patent. No proximal stenosis of the anterior or middle cerebral arteries. No MCA branch occlusion. No intracranial aneurysm.  Posterior circulation: No significant stenosis, proximal occlusion, aneurysm, or vascular malformation.  Venous sinuses: As permitted by contrast timing, patent.  Anatomic variants: None of significance.  Delayed phase:   No abnormal intracranial enhancement.  IMPRESSION: Suspected calcific flow reducing lesion LEFT supraclinoid ICA 75% or greater.  Moderate atheromatous change RIGHT supraclinoid ICA  without definite flow reducing stenosis.  Moderate calcified and noncalcified plaque at the LEFT ICA origin, non stenotic. This could serve as a source of platelet emboli.  I personally discussed the findings with the attending neurologist at 4:10 p.m.   Electronically Signed   By: Staci Righter M.D.   On: 11/29/2014 16:16   Dg Chest 2 View  11/29/2014   CLINICAL DATA:  80 year old male with headache.  Stroke protocol.  EXAM: CHEST  2 VIEW  COMPARISON:  Chest x-ray 05/26/2017.  FINDINGS: Mild diffuse peribronchial cuffing. Lung volumes are normal. No consolidative airspace disease. No pleural effusions. No pneumothorax. No pulmonary nodule or mass noted. Pulmonary vasculature and the cardiomediastinal silhouette are within normal limits. Atherosclerosis in the thoracic aorta.  IMPRESSION: 1. Mild diffuse peribronchial cuffing may suggest an acute bronchitis. 2. Atherosclerosis.   Electronically Signed   By: Vinnie Langton M.D.   On: 11/29/2014 22:00   Ct Head Wo Contrast  11/29/2014   CLINICAL DATA:  Code stroke. Initial encounter. Aphasia. Transient episode of aphasia with lightheadedness and tongue numbness.  EXAM: CT HEAD WITHOUT CONTRAST  TECHNIQUE: Contiguous axial images were obtained from the base of the skull through the vertex without intravenous contrast.  COMPARISON:  None.  FINDINGS: No mass lesion, mass effect, midline shift, hydrocephalus, hemorrhage. No acute territorial cortical ischemia/infarct. Atrophy and chronic ischemic white matter disease is present. Tiny lacunar infarct is present posterior limb of the LEFT internal capsule adjacent to the thalamus. Intracranial atherosclerosis. Calvarium intact. Visible paranasal sinuses are within normal limits. LEFT vitreous replacement incidentally noted. Bilateral lens extractions.  IMPRESSION: 1. Atrophy and chronic ischemic white matter disease without acute intracranial abnormality. 2. Critical Value/emergent results were called by telephone  at the time of interpretation on 11/29/2014 at 3:06 pm to Dr. Murvin Natal, who verbally acknowledged these results.   Electronically Signed   By: Dereck Ligas M.D.   On: 11/29/2014 15:07   Ct Angio Neck W/cm &/or Wo/cm  11/29/2014   CLINICAL DATA:  80 year old diabetic male with transient alteration of mental status and word-finding difficulties lasting for 2 minutes, now resolved. New onset of atrial fibrillation. Initial encounter.  EXAM: CT ANGIOGRAPHY HEAD AND NECK  TECHNIQUE: Multidetector CT imaging of the head and neck was performed using the standard protocol during bolus administration of intravenous contrast. Multiplanar CT image reconstructions and MIPs were obtained to evaluate the vascular anatomy. Carotid stenosis measurements (when applicable) are obtained utilizing NASCET criteria, using the distal internal carotid diameter as the denominator.  CONTRAST:  Omnipaque 350, 50 mL.  COMPARISON:  CT head earlier today.  FINDINGS: CT HEAD  Calvarium and skull base: No fracture or destructive lesion. Mastoids and middle ears are grossly clear.  Paranasal sinuses: Imaged portions are clear.  Orbits: No acute findings.  LEFT vitreous replacement.  Brain: No evidence of acute abnormality, including acute infarct, hemorrhage, hydrocephalus, or mass  lesion. Small remote infarct posterior limb internal capsule, LEFT.  CTA NECK  Aortic arch: Standard branching. Imaged portion shows no evidence of aneurysm or dissection. No significant stenosis of the major arch vessel origins.  Right carotid system: Minimal calcific plaque at the bifurcation. No evidence of dissection, stenosis (50% or greater) or occlusion.  Left carotid system: Moderate calcific and non-calcified plaque at the LEFT ICA origin. No evidence of dissection, stenosis (50% or greater) or occlusion.  Vertebral arteries: Codominant. No evidence of dissection, stenosis (50% or greater) or occlusion.  CTA HEAD  Anterior circulation: BILATERAL calcific  atheromatous change in the cavernous and supraclinoid segments. As seen on coronal series 403 image 87, suspected 50-75% supraclinoid RIGHT ICA stenosis. Suspected 75% or greater LEFT ICA supraclinoid stenosis. ICA termini widely patent. No proximal stenosis of the anterior or middle cerebral arteries. No MCA branch occlusion. No intracranial aneurysm.  Posterior circulation: No significant stenosis, proximal occlusion, aneurysm, or vascular malformation.  Venous sinuses: As permitted by contrast timing, patent.  Anatomic variants: None of significance.  Delayed phase:   No abnormal intracranial enhancement.  IMPRESSION: Suspected calcific flow reducing lesion LEFT supraclinoid ICA 75% or greater.  Moderate atheromatous change RIGHT supraclinoid ICA without definite flow reducing stenosis.  Moderate calcified and noncalcified plaque at the LEFT ICA origin, non stenotic. This could serve as a source of platelet emboli.  I personally discussed the findings with the attending neurologist at 4:10 p.m.   Electronically Signed   By: Staci Righter M.D.   On: 11/29/2014 16:16    Microbiology: No results found for this or any previous visit (from the past 240 hour(s)).   Labs: Basic Metabolic Panel:  Recent Labs Lab 11/29/14 1452 11/29/14 1457 11/30/14 0415  NA 140 141 140  K 4.2 4.1 4.1  CL 110 105 107  CO2 20*  --  27  GLUCOSE 100* 96 100*  BUN 15 16 13   CREATININE 0.97 0.90 1.00  CALCIUM 9.3  --  9.2   Liver Function Tests:  Recent Labs Lab 11/29/14 1452  AST 24  ALT 32  ALKPHOS 54  BILITOT 0.7  PROT 6.4*  ALBUMIN 4.0   No results for input(s): LIPASE, AMYLASE in the last 168 hours. No results for input(s): AMMONIA in the last 168 hours. CBC:  Recent Labs Lab 11/29/14 1452 11/29/14 1457 11/30/14 0415  WBC 6.1  --  6.3  NEUTROABS 3.0  --   --   HGB 14.6 15.3 14.3  HCT 43.3 45.0 41.6  MCV 91.0  --  89.8  PLT 199  --  182   Cardiac Enzymes: No results for input(s): CKTOTAL,  CKMB, CKMBINDEX, TROPONINI in the last 168 hours. BNP: BNP (last 3 results) No results for input(s): BNP in the last 8760 hours.  ProBNP (last 3 results) No results for input(s): PROBNP in the last 8760 hours.  CBG:  Recent Labs Lab 11/29/14 1531  GLUCAP 92       Signed:  Safiyah Cisney  [facility] 11/30/2014, 4:29 PM

## 2014-11-30 NOTE — Care Management Note (Signed)
Case Management Note  Patient Details  Name: Luis [name] MRN: [id number] Date of Birth: 1934-08-11  Subjective/Objective:   Pt admitted for slurred speech. Pt is from home and plan is to return home once stable.                  Action/Plan: CM did call [facility] and medication Eliquis is available. Co pay for medication will be 184.06 No further needs from CM at this time.   Expected Discharge Date:                  Expected Discharge Plan:  Home/Self Care  In-House Referral:     Discharge planning Services  CM Consult  Post Acute Care Choice:    Choice offered to:     DME Arranged:    DME Agency:     HH Arranged:    [place] Agency:     Status of Service:  Completed, signed off  Medicare Important Message Given:  No Date Medicare IM Given:    Medicare IM give by:    Date Additional Medicare IM Given:    Additional Medicare Important Message give by:     If discussed at [employer] of Stay Meetings, dates discussed:    Additional Comments:  Bethena Roys, RN 11/30/2014, 2:35 PM

## 2014-11-30 NOTE — Progress Notes (Signed)
       Patient Name: Luis [name] Date of Encounter: 11/30/2014    SUBJECTIVE: Speech is improved.   TELEMETRY:  Continuous atrial fibrillation with controlled rate. Filed Vitals:   11/30/14 0310 11/30/14 0510 11/30/14 0532 11/30/14 0725  BP: 135/93 138/104  136/81  Pulse: 130 58  56  Temp:   98 F (36.7 C) 98.3 F (36.8 C)  TempSrc:   Oral Oral  Resp: 16 14  12   Height:      Weight:   92.715 kg (204 lb 6.4 oz)   SpO2: 95% 96%  97%    Intake/Output Summary (Last 24 hours) at 11/30/14 1027 Last data filed at 11/30/14 0533  Gross per 24 hour  Intake      0 ml  Output    700 ml  Net   -700 ml   LABS: Basic Metabolic Panel:  Recent Labs  11/29/14 1452 11/29/14 1457 11/30/14 0415  NA 140 141 140  K 4.2 4.1 4.1  CL 110 105 107  CO2 20*  --  27  GLUCOSE 100* 96 100*  BUN 15 16 13   CREATININE 0.97 0.90 1.00  CALCIUM 9.3  --  9.2   CBC:  Recent Labs  11/29/14 1452 11/29/14 1457 11/30/14 0415  WBC 6.1  --  6.3  NEUTROABS 3.0  --   --   HGB 14.6 15.3 14.3  HCT 43.3 45.0 41.6  MCV 91.0  --  89.8  PLT 199  --  182  Hemoglobin A1C: No results for input(s): HGBA1C in the last 72 hours. Fasting Lipid Panel:  Recent Labs  11/30/14 0415  CHOL 153  HDL 47  LDLCALC 92  TRIG 72  CHOLHDL 3.3    Radiology/Studies:  CXR 6/20:  IMPRESSION: 1. Mild diffuse peribronchial cuffing may suggest an acute bronchitis. 2. Atherosclerosis.  Physical Exam: Blood pressure 136/81, pulse 56, temperature 98.3 F (36.8 C), temperature source Oral, resp. rate 12, height 6' (1.829 m), weight 92.715 kg (204 lb 6.4 oz), SpO2 97 %. Weight change:   Wt Readings from Last 3 Encounters:  11/30/14 92.715 kg (204 lb 6.4 oz)  02/01/14 93.35 kg (205 lb 12.8 oz)  07/31/12 92.08 kg (203 lb)    IIRR Chest clear Neuro with slight slurring of speech  ASSESSMENT:  1. Atrial fibrillation of unknown duration with controlled rate. 2. 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 CAD with remote  DES, last 07/2011.   Plan:  1. Agree with d/c DAPT and long term Eliquis 2. 2-D echo results are pending. Assuming normal LV function, our strategy with be rate control on atenolol. If decreased LV function, may consider cardioversion. 3. Will need at least an OP 48 hour Holter to insure rate control.  Demetrios Isaacs 11/30/2014, 10:27 AM

## 2014-11-30 NOTE — Discharge Instructions (Addendum)
Information on my medicine - ELIQUIS (apixaban)  This medication education was reviewed with me or my healthcare representative as part of my discharge preparation.  The pharmacist that spoke with me during my hospital stay was:  Wayland Salinas, [facility]  Why was Eliquis prescribed for you? Eliquis was prescribed for you to reduce the risk of a blood clot forming that can cause a stroke if you have a medical condition called atrial fibrillation (a type of irregular heartbeat).  What do You need to know about Eliquis ? Take your Eliquis TWICE DAILY - one tablet in the morning and one tablet in the evening with or without food. If you have difficulty swallowing the tablet whole please discuss with your pharmacist how to take the medication safely.  Take Eliquis exactly as prescribed by your doctor and DO NOT stop taking Eliquis without talking to the doctor who prescribed the medication.  Stopping may increase your risk of developing a stroke.  Refill your prescription before you run out.  After discharge, you should have regular check-up appointments with your healthcare provider that is prescribing your Eliquis.  In the future your dose may need to be changed if your kidney function or weight changes by a significant amount or as you get older.  What do you do if you miss a dose? If you miss a dose, take it as soon as you remember on the same day and resume taking twice daily.  Do not take more than one dose of ELIQUIS at the same time to make up a missed dose.  Important Safety Information A possible side effect of Eliquis is bleeding. You should call your healthcare provider right away if you experience any of the following: ? Bleeding from an injury or your nose that does not stop. ? Unusual colored urine (red or dark brown) or unusual colored stools (red or black). ? Unusual bruising for unknown reasons. ? A serious fall or if you hit your head (even if there is no  bleeding).  Some medicines may interact with Eliquis and might increase your risk of bleeding or clotting while on Eliquis. To help avoid this, consult your healthcare provider or pharmacist prior to using any new prescription or non-prescription medications, including herbals, vitamins, non-steroidal anti-inflammatory drugs (NSAIDs) and supplements.  This website has more information on Eliquis (apixaban): http://www.eliquis.com/eliquis/home    Transient Ischemic Attack A transient ischemic attack (TIA) is a "warning stroke" that causes stroke-like symptoms. A TIA does not cause lasting damage to the brain. It is important to know when to get help and what to do to prevent stroke or death.  HOME CARE   Take all medicines exactly as told by your doctor. Understand all your medicine instructions.  You may need to take aspirin or warfarin medicine. Take warfarin exactly as told.  Taking too much or too little warfarin is dangerous. Blood tests must be done as often as told by your doctor. These blood tests help your doctor make sure the amount of warfarin you are taking is right. A PT blood test measures how long it takes for blood to clot. Your PT is used to calculate another value called an INR. Your PT and INR help your doctor adjust your warfarin dosage.  Food can cause problems with warfarin and affect the results of your blood tests. This is true for foods high in vitamin K. Spinach, kale, broccoli, cabbage, collard and turnip greens, Brussels sprouts, peas, cauliflower, seaweed, and parsley are high  in vitamin K as well as beef and pork liver, green tea, and soybean oil. Eat the same amount of food high in vitamin K. Avoid major changes in your diet. Tell your doctor before changing your diet. Talk to a food specialist (dietitian) if you have questions.  Many medicines can cause problems with warfarin and affect your PT and INR. Tell your doctor about all medicines you take. This includes  vitamins and dietary pills (supplements). Be careful with aspirin and medicines that relieve redness, soreness, and puffiness (inflammation). Do not take or stop medicines unless your doctor tells you to.  Warfarin can cause a lot of bruising or bleeding. Hold pressure over cuts for longer than normal. Talk to your doctor about other side effects of warfarin.  Avoid sports or activities that may cause injury or bleeding.  Be careful when you shave, floss your teeth, or use sharp objects.  Avoid alcoholic drinks or drink very little alcohol while taking warfarin. Tell your doctor if you change how much alcohol you drink.  Tell your dentist and other doctors that you take warfarin before procedures.  Eat 5 or more servings of fruits and vegetables a day.  Follow your diet program as told, if you are given one.  Keep a healthy weight.  Stay active. Try to get at least 30 minutes of activity on most or all days.  Do not smoke.  Limit how much alcohol you drink even if you are not taking warfarin. Moderate alcohol use is:  No more than 2 drinks each day for men.  No more than 1 drink each day for women who are not pregnant.  Stop abusing drugs.  Keep your home safe so you do not fall. Try:  Putting grab bars in the bedroom and bathroom.  Raising toilet seats.  Putting a seat in the shower.  Keep all doctor visits a told. GET HELP IF:  Your personality changes.  You have trouble swallowing.  You are seeing two of everything.  You are dizzy.  You have a fever.  Your skin starts to break down. GET HELP RIGHT AWAY IF:  The symptoms below may be a sign of an emergency. Do not wait to see if the symptoms go away. Call for help (911 in [place] Do not drive yourself to the hospital.  You have sudden weakness or numbness on the face, arm, or leg (especially on one side of the body).  You have sudden trouble walking or moving your arms or legs.  You have sudden  confusion.  You have trouble talking or understanding.  You have sudden trouble seeing in one or both eyes.  You lose your balance or your movements are not smooth.  You have a sudden, severe headache with no known cause.  You have new chest pain or you feel your heart beating in a unsteady way.  You are partly or totally unaware of what is going on around you. MAKE SURE YOU:   Understand these instructions.  Will watch your condition.  Will get help right away if you are not doing well or get worse. Document Released: 03/06/2008 Document Revised: 10/12/2013 Document Reviewed: 09/02/2013 [facility] Patient Information 2015 [address], [place]. This information is not intended to replace advice given to you by your health care provider. Make sure you discuss any questions you have with your health care provider.

## 2014-11-30 NOTE — Progress Notes (Signed)
STROKE TEAM PROGRESS NOTE   HISTORY Luis [name] is an 80 y.o. male with HTN, hyperlipidemia, and cardiac history who is on both ASA and Plavix. EMS reports that the patient had sudden onset difficulty with speech at 1420 6/200/2016 ([facility]KW) in which he was unable to get his words out. This was witnessed by his wife and lasted 1-2 minutes with period of confusion then resolved. EMS was transporting the patient when he reported that his tongue became numb and he felt lightheaded. Code stroke activated. On arrival patient was noted to have left facial droop but per wife this is old.  Denies HA, vertigo, double vision, focal weakness or numbness, slurred speech, confusion, or vision impairment. CT brain was personally reviewed and showed no acute abnormality. CTA brain reviewed and showed no large artery occlusion. NIHSS: 0. Modified Rankin: Rankin Score=0 Patient was not administered TPA secondary to  symptoms resolved. He was admitted for further evaluation and treatment.   SUBJECTIVE (INTERVAL HISTORY) His wife and son are at the bedside.  Overall he feels his condition is stable. He could not tolerate MRI.He was found to be in new onset AFIB   OBJECTIVE Temp:  [97.7 F (36.5 C)-98.5 F (36.9 C)] 98.3 F (36.8 C) (06/21 0725) Pulse Rate:  [56-130] 56 (06/21 0725) Cardiac Rhythm:  [-] Atrial fibrillation (06/21 0800) Resp:  [12-17] 12 (06/21 0725) BP: (135-172)/(76-104) 136/81 mmHg (06/21 0725) SpO2:  [95 %-99 %] 97 % (06/21 0725) Weight:  [92.715 kg (204 lb 6.4 oz)-96.6 kg (212 lb 15.4 oz)] 92.715 kg (204 lb 6.4 oz) (06/21 0532)   Recent Labs Lab 11/29/14 1531  GLUCAP 92    Recent Labs Lab 11/29/14 1452 11/29/14 1457 11/30/14 0415  NA 140 141 140  K 4.2 4.1 4.1  CL 110 105 107  CO2 20*  --  27  GLUCOSE 100* 96 100*  BUN 15 16 13   CREATININE 0.97 0.90 1.00  CALCIUM 9.3  --  9.2    Recent Labs Lab 11/29/14 1452  AST 24  ALT 32  ALKPHOS 54  BILITOT 0.7  PROT  6.4*  ALBUMIN 4.0    Recent Labs Lab 11/29/14 1452 11/29/14 1457 11/30/14 0415  WBC 6.1  --  6.3  NEUTROABS 3.0  --   --   HGB 14.6 15.3 14.3  HCT 43.3 45.0 41.6  MCV 91.0  --  89.8  PLT 199  --  182   No results for input(s): CKTOTAL, CKMB, CKMBINDEX, TROPONINI in the last 168 hours.  Recent Labs  11/29/14 1452  LABPROT 14.0  INR 1.06    Recent Labs  11/29/14 1735  COLORURINE YELLOW  LABSPEC 1.015  PHURINE 6.0  GLUCOSEU NEGATIVE  HGBUR NEGATIVE  BILIRUBINUR NEGATIVE  KETONESUR NEGATIVE  PROTEINUR NEGATIVE  UROBILINOGEN 0.2  NITRITE NEGATIVE  LEUKOCYTESUR NEGATIVE       Component Value Date/Time   CHOL 153 11/30/2014 0415   TRIG 72 11/30/2014 0415   TRIG 80 05/07/2006 1036   HDL 47 11/30/2014 0415   CHOLHDL 3.3 11/30/2014 0415   CHOLHDL 2.9 CALC 05/07/2006 1036   VLDL 14 11/30/2014 0415   LDLCALC 92 11/30/2014 0415   Lab Results  Component Value Date   HGBA1C 5.8 05/07/2006      Component Value Date/Time   LABOPIA NONE DETECTED 11/29/2014 1735   COCAINSCRNUR NONE DETECTED 11/29/2014 1735   LABBENZ NONE DETECTED 11/29/2014 1735   AMPHETMU NONE DETECTED 11/29/2014 1735   THCU NONE DETECTED 11/29/2014 1735  LABBARB NONE DETECTED 11/29/2014 1735     Recent Labs Lab 11/29/14 [place] <5    Dg Chest 2 View 11/29/2014   1. Mild diffuse peribronchial cuffing may suggest an acute bronchitis. 2. Atherosclerosis.     Ct Head Wo Contrast 11/29/2014    1. Atrophy and chronic ischemic white matter disease without acute intracranial abnormality.   Ct Angio Head & Neck W/cm &/or Wo/cm 11/29/2014   Suspected calcific flow reducing lesion LEFT supraclinoid ICA 75% or greater.  Moderate atheromatous change RIGHT supraclinoid ICA without definite flow reducing stenosis.  Moderate calcified and noncalcified plaque at the LEFT ICA origin, non stenotic. This could serve as a source of platelet emboli.    2D Echocardiogram  EF 65-70% with no source of embolus.    Carotid Doppler  There is mild heterogeneous plaque in bilateral extracranial carotid with 1-39% bilateral ICA stenosis. Vertebral artery flow is antegrade.    PHYSICAL EXAM Pleasant middle aged obese Caucasian male not in distress. . Afebrile. Head is nontraumatic. Neck is supple without bruit.    Cardiac exam no murmur or gallop. Lungs are clear to auscultation. Distal pulses are well felt.  Neurological Exam ;  Awake  Alert oriented x 3. Normal speech and language.eye movements full without nystagmus.fundi were not visualized. Vision acuity and fields appear normal. Hearing is normal. Palatal movements are normal. Face symmetric. Tongue midline. Normal strength, tone, reflexes and coordination. Normal sensation. Gait deferred. ASSESSMENT/PLAN [name] is a 80 y.o. male with history of HTN, hyperlipidemia, and cardiac history presenting with difficulty getting his words out. He did not receive IV t-PA due to symptoms resolved.   Left brain TIA, secondary to new onset atrial fibrillation   Resultant  Neuro symptoms resolved  CTA head and neck L cavernous ICA 75% stenosis   MRI  / MRA  Pt could not tolerate. Will cancel. Completion will not alter therapy  Carotid doppler No significant stenosis   2D Echo  No source of embolus   EEG done. Results pending   LDL 92  HgbA1c pending  Eliquis for VTE prophylaxis Diet Heart Room service appropriate?: Yes; Fluid consistency:: Thin  aspirin 81 mg orally every day and clopidogrel 75 mg orally every day prior to admission, changed to eliquis (apixaban) by cardiology with aspirin and plavix stopped as > 3 years since stent. No indication for aspirin in addition to Eliquis from stroke standpoint (typically with large vessel stenosis, i.e. Left ICA stenosis seen on CT angiogram neck, we would start dual antiplatelets, but given TIA on aspirin and Plavix, no indication to continue)  Patient counseled to be compliant with his  antithrombotic medications  Ongoing aggressive stroke risk factor management  Therapy recommendations:  No therapy needs  Disposition:  Discharge home [place] Patient has a 10-15% risk of having another stroke over the next year, the highest risk is within 2 weeks of the most recent stroke/TIA (risk of having a stroke following a stroke or TIA is the same). Ongoing risk factor control by Primary Care Physician Stroke Service will sign off. Please call should any needs arise. Follow-up Stroke Clinic at [facility] with Dr. Antony Contras in 2 months, order placed.  Atrial Fibrillation, new onset  Started on eliquis  Plan to cardiovert in 4 weeks per cardiology   Essential Hypertension  Stable  Hyperlipidemia  Home meds:  zetia 10 mg daily, on hold now  LDL 92,  goal < 70  Now on lipitor 80 mg daily  Continue statin at discharge  Other Stroke Risk Factors  Advanced age  Former Cigarette smoker, quit smoking 15 years ago   Coronary artery disease - MI, PCI w/ stent 2001, 2013  Other Active Problems  Blind L eye, previous retinal surgeries  Hospital day # [place] for Pager information 11/30/2014 5:00 PM  I have personally examined this patient, reviewed notes, independently viewed imaging studies, participated in medical decision making and plan of care. I have made any additions or clarifications directly to the above note. Agree with note above. He presentd with transient expressive language difficulties in setting of new onset AFIB likley left brain TIA. He remains at risk for neurological worsening, recurrent TIAs and strokes and needs ongoing stroke evaluation and aggressive risk factor modification. Continue eliquis.  Antony Contras, MD Medical Director [facility] Pager: [phone] 11/30/2014 6:33 PM    To contact Stroke Continuity provider,  please refer to [email or web address] After hours, contact General Neurology

## 2014-11-30 NOTE — Progress Notes (Signed)
Echocardiogram 2D Echocardiogram has been performed.  Luis [name] 11/30/2014, 10:02 AM

## 2014-12-01 ENCOUNTER — Telehealth: Payer: Self-pay

## 2014-12-01 LAB — HEMOGLOBIN A1C
HEMOGLOBIN A1C: 5.9 % — AB (ref 4.8–5.6)
HEMOGLOBIN A1C: 6.1 % — AB (ref 4.8–5.6)
Mean Plasma Glucose: 123 mg/dL
Mean Plasma Glucose: 128 mg/dL

## 2014-12-01 NOTE — Telephone Encounter (Signed)
TCM List   LVM for pt to call back as soon as possible.   RE: schedule a TCM hospital fu appt with Linna Darner   *if scheduling schedules please forward phone note back to stef.

## 2014-12-02 ENCOUNTER — Telehealth: Payer: Self-pay | Admitting: Cardiovascular Disease

## 2014-12-02 DIAGNOSIS — I4891 Unspecified atrial fibrillation: Secondary | ICD-10-CM

## 2014-12-02 NOTE — Telephone Encounter (Signed)
Left message to call back  

## 2014-12-02 NOTE — Telephone Encounter (Signed)
Scheduled 6/30 at 2:00 pm.

## 2014-12-02 NOTE — Telephone Encounter (Signed)
Spoke with pt. He is out of town and unable to come in to have holter placed until December 08, 2014.  Appt made for pt to have holter placed on December 08, 2014 at 9:00.  Pt is planning on going out of town on December 18, 2014.  Post hospital appt made with Dr. Angelena Form for December 16, 2014 at 3:00

## 2014-12-02 NOTE — Telephone Encounter (Signed)
Pt returned call

## 2014-12-02 NOTE — Telephone Encounter (Signed)
New Message  Pt called req a call back to discuss---Discharge orders: Discharge home with outpatient cardiology follow-up in 2 weeks. Plan on outpatient Holter monitoring.

## 2014-12-06 ENCOUNTER — Telehealth: Payer: Self-pay | Admitting: Cardiovascular Disease

## 2014-12-06 NOTE — Telephone Encounter (Signed)
Left message for pt to call back  °

## 2014-12-06 NOTE — Telephone Encounter (Signed)
I do not see permission signed in chart for me to speak with pt's wife.  I returned call to pt's wife and gave her this information. She gave me pt's cell number [phone] and asked that I contact him to discuss upcoming cruise.  I told her pt can sign paperwork next time he is office indicating we could speak with her.

## 2014-12-06 NOTE — Telephone Encounter (Signed)
Follow Up

## 2014-12-06 NOTE — Telephone Encounter (Signed)
New Message       Pt's wife calling stating that she needs to talk to Fraser Din about if pt needs to be seen before they go on a cruise. Please call back and advise.

## 2014-12-06 NOTE — Telephone Encounter (Signed)
Left message to call back  

## 2014-12-07 NOTE — Telephone Encounter (Signed)
Informed patient that both Fraser Din, RN, and Dr. Angelena Form both were out of the clinic today. Message will be forwarded to both of them so they can respond to patient's question tomorrow regarding if he should cancel his scheduled cruise on July 9th due to his AFib dx.

## 2014-12-07 NOTE — Telephone Encounter (Signed)
I left a message on his cell phone. Will try back later. cdm

## 2014-12-07 NOTE — Telephone Encounter (Signed)
New Message  Pt returning rn call   Please call him back thank you

## 2014-12-08 ENCOUNTER — Ambulatory Visit (INDEPENDENT_AMBULATORY_CARE_PROVIDER_SITE_OTHER): Payer: Medicare HMO

## 2014-12-08 DIAGNOSIS — I4891 Unspecified atrial fibrillation: Secondary | ICD-10-CM

## 2014-12-08 NOTE — Telephone Encounter (Signed)
Dr. Angelena Form spoke with pt.  Pt also completed designated party release form allowing [place] to speak with his wife.

## 2014-12-09 ENCOUNTER — Inpatient Hospital Stay: Payer: Commercial Managed Care - HMO | Admitting: Internal Medicine

## 2014-12-09 NOTE — Telephone Encounter (Signed)
I spoke to him in the office yesterday. cdm

## 2014-12-10 ENCOUNTER — Ambulatory Visit (INDEPENDENT_AMBULATORY_CARE_PROVIDER_SITE_OTHER): Payer: Medicare HMO | Admitting: Internal Medicine

## 2014-12-10 VITALS — BP 140/78 | HR 70 | Temp 97.6°F | Resp 18 | Wt 212.0 lb

## 2014-12-10 DIAGNOSIS — I4891 Unspecified atrial fibrillation: Secondary | ICD-10-CM | POA: Diagnosis not present

## 2014-12-10 DIAGNOSIS — G459 Transient cerebral ischemic attack, unspecified: Secondary | ICD-10-CM

## 2014-12-10 NOTE — Patient Instructions (Addendum)
Please follow a Mediaterranean type diet  (many good cook books readily available) or review Dr Nunzio Cory book Eat, Welcome for best  dietary cholesterol information & options.   Cardiovascular exercise, this can be as simple a program as walking, is recommended 30-45 minutes 3-4 times per week. If you're not exercising you should take 6-8 weeks to build up to this level.

## 2014-12-10 NOTE — Progress Notes (Signed)
Pre visit review using our clinic review tool, if applicable. No additional management support is needed unless otherwise documented below in the visit note. 

## 2014-12-10 NOTE — Progress Notes (Signed)
   Subjective:    Patient ID: Luis [name], male    DOB: 03-27-1935, 80 y.o.   MRN: [id number]  HPI Hospital records were reviewed. He was hospitalized 6/20-21/16 with TIA in the context of new onset atrial fibrillation. Presentation was inability to speak for 30-45 seconds. This occurred despite his being on aspirin 81 mg and Plavix.  Past history includes 2 bare-metal stenting to the RCA in 2001 and 1 drug-eluting stent to the RCA in 2013.   In the hospital his LDL was 94 on 80 mg of Lipitor & Zetia 10 mg daily.  Review of Systems Chest pain, palpitations, tachycardia, exertional dyspnea, paroxysmal nocturnal dyspnea, claudication or edema are absent.  Epistaxis, hemoptysis, hematuria, melena, or rectal bleeding denied. No unexplained weight loss, significant dyspepsia,dysphagia, or abdominal pain.  There is no abnormal bruising , bleeding, or difficulty stopping bleeding with injury.     Objective:   Physical Exam  There is subtle nasolabial asymmetry with a slight droop on the left.He has deep vertical creases in the earlobes. The left pupil is asymmetric postoperatively. He has upper and lower partials. Heart rhythm and rate are slow and slightly irregular. Chest is barrel-shaped.  Pertinent or positive findings include: General appearance :adequately nourished; in no distress.  Eyes: No conjunctival inflammation or scleral icterus is present.  Oral exam:  Lips and gums are healthy appearing.There is no oropharyngeal erythema or exudate noted. Dental hygiene is good.  Heart:  S1 and S2 normal without gallop, murmur, click, rub or other extra sounds    Lungs:Chest clear to auscultation; no wheezes, rhonchi,rales ,or rubs present.No increased work of breathing.   Abdomen: bowel sounds normal, soft and non-tender without masses, organomegaly or hernias noted.  No guarding or rebound. No flank tenderness to percussion.  Vascular : all pulses equal ; no bruits  present.  Skin:Warm & dry.  Intact without suspicious lesions or rashes ; no tenting or jaundice   Lymphatic: No lymphadenopathy is noted about the head, neck, axilla.   Neuro: Strength, tone & DTRs normal.        Assessment & Plan:  #1 TIA #2new onset AF  Plan: see AVS

## 2014-12-16 ENCOUNTER — Ambulatory Visit (INDEPENDENT_AMBULATORY_CARE_PROVIDER_SITE_OTHER): Payer: Medicare HMO | Admitting: Cardiovascular Disease

## 2014-12-16 ENCOUNTER — Encounter: Payer: Self-pay | Admitting: Cardiovascular Disease

## 2014-12-16 VITALS — BP 100/60 | HR 72 | Ht 72.0 in | Wt 210.5 lb

## 2014-12-16 DIAGNOSIS — E7800 Pure hypercholesterolemia, unspecified: Secondary | ICD-10-CM

## 2014-12-16 DIAGNOSIS — I4891 Unspecified atrial fibrillation: Secondary | ICD-10-CM | POA: Diagnosis not present

## 2014-12-16 DIAGNOSIS — I779 Disorder of arteries and arterioles, unspecified: Secondary | ICD-10-CM

## 2014-12-16 DIAGNOSIS — I10 Essential (primary) hypertension: Secondary | ICD-10-CM | POA: Diagnosis not present

## 2014-12-16 DIAGNOSIS — I2510 Atherosclerotic heart disease of native coronary artery without angina pectoris: Secondary | ICD-10-CM

## 2014-12-16 DIAGNOSIS — I739 Peripheral vascular disease, unspecified: Secondary | ICD-10-CM

## 2014-12-16 NOTE — Patient Instructions (Signed)
Medication Instructions:  Your physician recommends that you continue on your current medications as directed. Please refer to the Current Medication list given to you today.    Labwork: none  Testing/Procedures: none  Follow-Up:  Your physician recommends that you schedule a follow-up appointment in early September--February 18, 2015 at 11:00

## 2014-12-16 NOTE — Progress Notes (Signed)
Chief Complaint  Patient presents with  . Follow-up    History of Present Illness: 80 yo white male with history of CAD, HTN, HLD and now atrial fibrillation here today for cardiac follow up. His cardiac history dates back to 2001. He had a bare-metal stent placed in the right coronary artery in 2001 with a staged intervention to the circumflex coronary artery with placement of a bare-metal stent in the mid circumflex vessel and balloon angioplasty of the ostium of the first obtuse marginal coronary artery at that time. Cardiac cath February 2013 with placement of two non-overlapping DES in the RCA and severe disease in the small distal Circumflex treated with balloon angioplasty only. Carotid artery dopplers 06/07/10 with mild bilateral disease. He was admitted to [facility] 11/30/14 with TIA type symptoms and was found to be in atrial fibrillation. He was started on Eliquis and atenolol was continued.   He is here today for follow up. He is doing well. No chest pain or SOB. He is not aware of palpitations.  He has no dizziness, near syncope or syncope.   Primary Care Physician: Linna Darner  Last Lipid Profile:Lipid Panel     Component Value Date/Time   CHOL 153 11/30/2014 0415   TRIG 72 11/30/2014 0415   TRIG 80 05/07/2006 1036   HDL 47 11/30/2014 0415   CHOLHDL 3.3 11/30/2014 0415   CHOLHDL 2.9 CALC 05/07/2006 1036   VLDL 14 11/30/2014 0415   LDLCALC 92 11/30/2014 0415   Past Medical History  Diagnosis Date  . Unspecified essential hypertension   . HYPERLIPIDEMIA   . CORONARY ARTERY DISEASE     Plavix as of 07/30/11; Dr Angelena Form  . COLONIC POLYPS, HX OF     Dr Earlean Shawl  . DJD (degenerative joint disease)   . Myocardial infarction 08/1999  . Angina     Past Surgical History  Procedure Laterality Date  . Coronary angioplasty with stent placement  2001; 07/30/11;    "2+3"  . Cardiac catheterization  ~ 2002  . Coronary angioplasty  07/30/11  . Cataract extraction w/ intraocular lens   implant, bilateral  ~ 2007    bilaterally  . Retinal detachment surgery  05/2008; 06/2008; 07/2008    left; "all 3 surgeries have failed"  . Inguinal hernia repair  2005    left  . Left heart catheterization with coronary angiogram N/A 07/30/2011    Procedure: LEFT HEART CATHETERIZATION WITH CORONARY ANGIOGRAM;  Surgeon: Burnell Blanks, MD;  Location: [facility] CATH LAB;  Service: Cardiovascular;  Laterality: N/A;  . Percutaneous coronary stent intervention (pci-s)  07/30/2011    Procedure: PERCUTANEOUS CORONARY STENT INTERVENTION (PCI-S);  Surgeon: Burnell Blanks, MD;  Location: [facility] CATH LAB;  Service: Cardiovascular;;    Current Outpatient Prescriptions  Medication Sig Dispense Refill  . apixaban (ELIQUIS) 5 MG TABS tablet Take 1 tablet (5 mg total) by mouth 2 (two) times daily. 60 tablet 0  . atenolol (TENORMIN) 50 MG tablet Take 1 tablet (50 mg total) by mouth daily. 30 tablet 11  . atorvastatin (LIPITOR) 80 MG tablet TAKE 1 TABLET ONCE DAILY. 30 tablet 3  . Cyanocobalamin (VITAMIN B-12 PO) Take 1 tablet by mouth daily.    . DHA-EPA-VITAMIN E PO Take 2 tablets by mouth daily.    . folic acid (FOLVITE) 1 MG tablet TAKE 1 TABLET ONCE DAILY. 30 tablet 0  . Glucosamine HCl 1000 MG TABS Take by mouth daily.    . Ibuprofen-Diphenhydramine HCl (ADVIL PM) 200-25 MG  CAPS Take 1 tablet by mouth daily.    Marland Kitchen MELATONIN PO Take 1 tablet by mouth at bedtime.    . Misc Natural Products (BLACK CHERRY CONCENTRATE PO) Take 400 mg by mouth daily. 2 BY MOUTH ONCE DAILY    . NITROSTAT 0.4 MG SL tablet DISSOLVE 1 TABLET UNDER TONGUE AS NEEDED FOR CHEST PAIN,MAY REPEAT IN5 MINUTES FOR 2 DOSES. 25 tablet 0  . Potassium Aminobenzoate (POTABA) 500 MG TABS Take 1 tablet by mouth daily.    . Saw Palmetto, Serenoa repens, (SAW PALMETTO PO) Take 400 mg by mouth daily.    . vitamin E 1000 UNIT capsule Take 2,000 Units by mouth daily.    Marland Kitchen ZETIA 10 MG tablet TAKE (1) TABLET DAILY AT BEDTIME. 30 tablet 3   No  current facility-administered medications for this visit.    No Known Allergies  History   Social History  . Marital Status: Married    Spouse Name: N/A  . Number of Children: N/A  . Years of Education: N/A   Occupational History  . Not on file.   Social History Main Topics  . Smoking status: Former Smoker -- 0.50 packs/day for 40 years    Types: Cigarettes    Quit date: 06/12/1999  . Smokeless tobacco: Never Used  . 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 Drug Use: No  . Sexual Activity: Not Currently   Other Topics Concern  . Not on file   Social History Narrative    Family History  Problem Relation Age of Onset  . Colon cancer Father     in 43s  . Prostate cancer Father   . Alcohol abuse Mother     Review of Systems:  As stated in the HPI and otherwise negative.   BP 100/60 mmHg  Pulse 72  Ht 6' (1.829 m)  Wt 210 lb 8.6 oz (95.5 kg)  BMI 28.55 kg/m2  SpO2 93%  Physical Examination: General: Well developed, well nourished, NAD HEENT: OP clear, mucus membranes moist SKIN: warm, dry. No rashes. Neuro: No focal deficits Musculoskeletal: Muscle strength 5/5 all ext Psychiatric: Mood and affect normal Neck: No JVD, no carotid bruits, no thyromegaly, no lymphadenopathy. Lungs:Clear bilaterally, no wheezes, rhonci, crackles Cardiovascular: Regular rate and rhythm. No murmurs, gallops or rubs. Abdomen:Soft. Bowel sounds present. Non-tender.  Extremities: No lower extremity edema. Pulses are 2 + in the bilateral DP/PT.  EKG:  EKG is not ordered today. The ekg ordered today demonstrates   Recent Labs: 11/29/2014: ALT 32 11/30/2014: BUN 13; Creatinine, Ser 1.00; Hemoglobin 14.3; Platelets 182; Potassium 4.1; Sodium 140   Lipid Panel    Component Value Date/Time   CHOL 153 11/30/2014 0415   TRIG 72 11/30/2014 0415   TRIG 80 05/07/2006 1036   HDL 47 11/30/2014 0415   CHOLHDL 3.3 11/30/2014 0415   CHOLHDL 2.9 CALC  05/07/2006 1036   VLDL 14 11/30/2014 0415   LDLCALC 92 11/30/2014 0415     Wt Readings from Last 3 Encounters:  12/16/14 210 lb 8.6 oz (95.5 kg)  12/10/14 212 lb (96.163 kg)  11/30/14 204 lb 6.4 oz (92.715 kg)     Other studies Reviewed: Additional studies/ records that were reviewed today include: . Review of the above records demonstrates:   Assessment and Plan:   1. CORONARY ARTERY DISEASE: Stable. Will continue current meds including statin, Zetia and beta blocker. He is no longer on ASA and Plavix since he  has been started on Eliquis.   2. Carotid artery disease: Mild disease by dopplers June 2016.   3. HTN: BP controlled. No changes.   4. HLD: Lipids well controlled. Continue statin.   5. Atrial fibrillation, paroxysmal: Still in atrial fib. Continue beta blocker and Eliquis. He is asymptomatic so will not plan DCCV.   Current medicines are reviewed at length with the patient today.  The patient does not have concerns regarding medicines.  The following changes have been made:  no change  Labs/ tests ordered today include:  No orders of the defined types were placed in this encounter.    Disposition:   FU with me in 8 weeks  Signed, Lauree Chandler, MD 12/16/2014 5:16 PM    [address], [place], [place]  [address] Phone: [phone]; Fax: [phone]

## 2014-12-27 ENCOUNTER — Other Ambulatory Visit: Payer: Self-pay | Admitting: Cardiovascular Disease

## 2014-12-27 MED ORDER — APIXABAN 5 MG PO TABS: 5.0000 mg | ORAL_TABLET | Freq: Two times a day (BID) | ORAL | Status: DC

## 2015-02-08 ENCOUNTER — Encounter: Payer: Self-pay | Admitting: Cardiovascular Disease

## 2015-02-18 ENCOUNTER — Ambulatory Visit (INDEPENDENT_AMBULATORY_CARE_PROVIDER_SITE_OTHER): Payer: Medicare HMO | Admitting: Cardiovascular Disease

## 2015-02-18 ENCOUNTER — Encounter: Payer: Self-pay | Admitting: Cardiovascular Disease

## 2015-02-18 VITALS — BP 110/70 | HR 51 | Ht 72.0 in | Wt 213.2 lb

## 2015-02-18 DIAGNOSIS — I480 Paroxysmal atrial fibrillation: Secondary | ICD-10-CM

## 2015-02-18 DIAGNOSIS — I25110 Atherosclerotic heart disease of native coronary artery with unstable angina pectoris: Secondary | ICD-10-CM

## 2015-02-18 DIAGNOSIS — I10 Essential (primary) hypertension: Secondary | ICD-10-CM

## 2015-02-18 DIAGNOSIS — E7800 Pure hypercholesterolemia, unspecified: Secondary | ICD-10-CM

## 2015-02-18 NOTE — Progress Notes (Signed)
Chief Complaint  Patient presents with  . Shortness of Breath    History of Present Illness: 80 yo white male with history of CAD, HTN, HLD and now atrial fibrillation here today for cardiac follow up. His cardiac history dates back to 2001. He had a bare-metal stent placed in the right coronary artery in 2001 with a staged intervention to the circumflex coronary artery with placement of a bare-metal stent in the mid circumflex vessel and balloon angioplasty of the ostium of the first obtuse marginal coronary artery at that time. Cardiac cath February 2013 with placement of two non-overlapping DES in the RCA and severe disease in the small distal Circumflex treated with balloon angioplasty only. Carotid artery dopplers 06/07/10 with mild bilateral disease. He was admitted to [facility] 11/30/14 with TIA type symptoms and was found to be in atrial fibrillation. He was started on Eliquis and atenolol was continued.   He is here today for follow up. He is still working full time. He notes dyspnea at rest. Weight is stable. No LE edema. No cough, fever, chills. No chest pain. He is not aware of palpitations.  He has no dizziness, near syncope or syncope. He thinks his dyspnea is similar to dyspnea before his last cath.   Primary Care Physician: Linna Darner  Last Lipid Profile:Lipid Panel     Component Value Date/Time   CHOL 153 11/30/2014 0415   TRIG 72 11/30/2014 0415   TRIG 80 05/07/2006 1036   HDL 47 11/30/2014 0415   CHOLHDL 3.3 11/30/2014 0415   CHOLHDL 2.9 CALC 05/07/2006 1036   VLDL 14 11/30/2014 0415   LDLCALC 92 11/30/2014 0415   Past Medical History  Diagnosis Date  . Unspecified essential hypertension   . HYPERLIPIDEMIA   . CORONARY ARTERY DISEASE     Plavix as of 07/30/11; Dr Angelena Form  . COLONIC POLYPS, HX OF     Dr Earlean Shawl  . DJD (degenerative joint disease)   . Myocardial infarction 08/1999  . Angina     Past Surgical History  Procedure Laterality Date  . Coronary angioplasty  with stent placement  2001; 07/30/11;    "2+3"  . Cardiac catheterization  ~ 2002  . Coronary angioplasty  07/30/11  . Cataract extraction w/ intraocular lens  implant, bilateral  ~ 2007    bilaterally  . Retinal detachment surgery  05/2008; 06/2008; 07/2008    left; "all 3 surgeries have failed"  . Inguinal hernia repair  2005    left  . Left heart catheterization with coronary angiogram N/A 07/30/2011    Procedure: LEFT HEART CATHETERIZATION WITH CORONARY ANGIOGRAM;  Surgeon: Burnell Blanks, MD;  Location: [facility] CATH LAB;  Service: Cardiovascular;  Laterality: N/A;  . Percutaneous coronary stent intervention (pci-s)  07/30/2011    Procedure: PERCUTANEOUS CORONARY STENT INTERVENTION (PCI-S);  Surgeon: Burnell Blanks, MD;  Location: [facility] CATH LAB;  Service: Cardiovascular;;    Current Outpatient Prescriptions  Medication Sig Dispense Refill  . atorvastatin (LIPITOR) 80 MG tablet Take 80 mg by mouth daily.    Marland Kitchen ezetimibe (ZETIA) 10 MG tablet Take 10 mg by mouth daily.    . folic acid (FOLVITE) 1 MG tablet Take 1 mg by mouth daily.    Marland Kitchen apixaban (ELIQUIS) 5 MG TABS tablet Take 1 tablet (5 mg total) by mouth 2 (two) times daily. 60 tablet 11  . atenolol (TENORMIN) 50 MG tablet Take 1 tablet (50 mg total) by mouth daily. 30 tablet 11  . atorvastatin (LIPITOR)  80 MG tablet TAKE 1 TABLET ONCE DAILY. 30 tablet 3  . DHA-EPA-VITAMIN E PO Take 2 tablets by mouth daily.    . Glucosamine HCl 1000 MG TABS Take 1 tablet by mouth daily.     . Ibuprofen-Diphenhydramine HCl (ADVIL PM) 200-25 MG CAPS Take 1 tablet by mouth daily.    Marland Kitchen MELATONIN PO Take 1 tablet by mouth at bedtime.    . Misc Natural Products (BLACK CHERRY CONCENTRATE PO) Take 400 mg by mouth daily. 2 BY MOUTH ONCE DAILY    . NITROSTAT 0.4 MG SL tablet DISSOLVE 1 TABLET UNDER TONGUE AS NEEDED FOR CHEST PAIN,MAY REPEAT IN5 MINUTES FOR 2 DOSES. 25 tablet 0  . Potassium Aminobenzoate (POTABA) 500 MG TABS Take 1 tablet by mouth daily.      . Saw Palmetto, Serenoa repens, (SAW PALMETTO PO) Take 400 mg by mouth daily.    . vitamin E 1000 UNIT capsule Take 2,000 Units by mouth daily.     No current facility-administered medications for this visit.    No Known Allergies  Social History   Social History  . Marital Status: Married    Spouse Name: N/A  . Number of Children: N/A  . Years of Education: N/A   Occupational History  . Not on file.   Social History Main Topics  . Smoking status: Former Smoker -- 0.50 packs/day for 40 years    Types: Cigarettes    Quit date: 06/12/1999  . Smokeless tobacco: Never Used  . 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 Drug Use: No  . Sexual Activity: Not Currently   Other Topics Concern  . Not on file   Social History Narrative    Family History  Problem Relation Age of Onset  . Colon cancer Father     in 71s  . Prostate cancer Father   . Alcohol abuse Mother     Review of Systems:  As stated in the HPI and otherwise negative.   BP 110/70 mmHg  Pulse 51  Ht 6' (1.829 m)  Wt 213 lb 3.2 oz (96.707 kg)  BMI 28.91 kg/m2  SpO2 98%  Physical Examination: General: Well developed, well nourished, NAD HEENT: OP clear, mucus membranes moist SKIN: warm, dry. No rashes. Neuro: No focal deficits Musculoskeletal: Muscle strength 5/5 all ext Psychiatric: Mood and affect normal Neck: No JVD, no carotid bruits, no thyromegaly, no lymphadenopathy. Lungs:Clear bilaterally, no wheezes, rhonci, crackles Cardiovascular: Regular rate and rhythm. No murmurs, gallops or rubs. Abdomen:Soft. Bowel sounds present. Non-tender.  Extremities: No lower extremity edema. Pulses are 2 + in the bilateral DP/PT.  EKG:  EKG is not ordered today. The ekg ordered today demonstrates   Recent Labs: 11/29/2014: ALT 32 11/30/2014: BUN 13; Creatinine, Ser 1.00; Hemoglobin 14.3; Platelets 182; Potassium 4.1; Sodium 140   Lipid Panel    Component Value  Date/Time   CHOL 153 11/30/2014 0415   TRIG 72 11/30/2014 0415   TRIG 80 05/07/2006 1036   HDL 47 11/30/2014 0415   CHOLHDL 3.3 11/30/2014 0415   CHOLHDL 2.9 CALC 05/07/2006 1036   VLDL 14 11/30/2014 0415   LDLCALC 92 11/30/2014 0415     Wt Readings from Last 3 Encounters:  02/18/15 213 lb 3.2 oz (96.707 kg)  12/16/14 210 lb 8.6 oz (95.5 kg)  12/10/14 212 lb (96.163 kg)     Other studies Reviewed: Additional studies/ records that were reviewed today include: . Review of the  above records demonstrates:   Assessment and Plan:   1. CAD with unstable angina: He is having recurrent dyspnea. It is not clear if this is his anginal equivalent. The dyspnea may be due to his atrial fibrillation. We have discussed a cardiac cath. He wishes to pursue this but not at this time as he has travel plans. He will call back to arrange. I have reviewed risks and benefits of the procediure. Will continue current meds including statin, Zetia and beta blocker. He is no longer on ASA and Plavix since he has been started on Eliquis.   2. Carotid artery disease: Mild disease by dopplers June 2016.   3. HTN: BP controlled. No changes.   4. HLD: Lipids well controlled. Continue statin.   5. Atrial fibrillation, paroxysmal: Still in atrial fib. Continue beta blocker and Eliquis. He is asymptomatic so will not plan DCCV.   Current medicines are reviewed at length with the patient today.  The patient does not have concerns regarding medicines.  The following changes have been made:  no change  Labs/ tests ordered today include:  No orders of the defined types were placed in this encounter.    Disposition:   FU with me in 8 weeks  Signed, Lauree Chandler, MD 02/18/2015 4:37 PM    [facility] [place], [place], [place]  [address] Phone: [phone]; Fax: [phone]

## 2015-02-18 NOTE — Patient Instructions (Signed)
Medication Instructions:  Your physician recommends that you continue on your current medications as directed. Please refer to the Current Medication list given to you today.   Labwork: Will need to be done a few days prior to catheterization  Testing/Procedures: Call us when you want to schedule the catheterization.   Your physician wants you to follow-up in: 6 months.  You will receive a reminder letter in the mail two months in advance. If you don't receive a letter, please call our office to schedule the follow-up appointment.    Any Other Special Instructions Will Be Listed Below (If Applicable).

## 2015-02-21 ENCOUNTER — Telehealth: Payer: Self-pay | Admitting: Cardiovascular Disease

## 2015-02-21 ENCOUNTER — Encounter: Payer: Self-pay | Admitting: *Deleted

## 2015-02-21 DIAGNOSIS — I25110 Atherosclerotic heart disease of native coronary artery with unstable angina pectoris: Secondary | ICD-10-CM

## 2015-02-21 NOTE — Telephone Encounter (Signed)
Pat, I asked if I could get cath lab space next week in the afternoon but they cannot give me a room to do his case due to staff number being down here in the cath lab. We can schedule it with Burt Knack next week if he would like or we can use another day I am in the cath lab. Gerald Stabs

## 2015-02-21 NOTE — Telephone Encounter (Signed)
Spoke with Luis [name] who is asking if Dr. Angelena Form can do cath next week (9/19-9/23).  I told Luis [name] I did not see any cath lab days scheduled for next week with Dr. Angelena Form but I would check with him about availability.  Luis [name] is not available the following week.

## 2015-02-21 NOTE — Telephone Encounter (Signed)
Left message to call back  

## 2015-02-21 NOTE — Telephone Encounter (Signed)
Reviewed with Fuller Canada, PharmD and pt should stop Eliquis after 02/28/15 evening dose.  I spoke with pt and verbally went over all instructions with him.  Will send instructions to him through my chart.  He will come in for lab work on September 19,2016.

## 2015-02-21 NOTE — Telephone Encounter (Signed)
Spoke with pt. He would like to have cath on 03/02/15. Will arrange and call him back with instructions.  He requests call back at [phone]

## 2015-02-21 NOTE — Telephone Encounter (Signed)
New Message    Pt is calling because he wants RN to schedule a hospital visit

## 2015-02-21 NOTE — Telephone Encounter (Signed)
I spoke with pt and gave him information from Dr. Angelena Form. I told him Dr. Burt Knack is in cath lab on 9/21 and 9/23.  He will check his schedule and call me back.

## 2015-02-22 NOTE — Telephone Encounter (Signed)
Thanks. I will put in orders for cath on 03/02/15. cdm

## 2015-02-22 NOTE — Addendum Note (Signed)
Addended by: Lauree Chandler D on: 02/22/2015 10:28 AM   Modules accepted: Orders

## 2015-02-27 ENCOUNTER — Other Ambulatory Visit: Payer: Self-pay | Admitting: Cardiovascular Disease

## 2015-02-28 ENCOUNTER — Other Ambulatory Visit (INDEPENDENT_AMBULATORY_CARE_PROVIDER_SITE_OTHER): Payer: Medicare HMO | Admitting: *Deleted

## 2015-02-28 ENCOUNTER — Other Ambulatory Visit: Payer: Self-pay | Admitting: *Deleted

## 2015-02-28 DIAGNOSIS — E785 Hyperlipidemia, unspecified: Secondary | ICD-10-CM

## 2015-02-28 DIAGNOSIS — I25110 Atherosclerotic heart disease of native coronary artery with unstable angina pectoris: Secondary | ICD-10-CM

## 2015-02-28 DIAGNOSIS — I2510 Atherosclerotic heart disease of native coronary artery without angina pectoris: Secondary | ICD-10-CM

## 2015-02-28 DIAGNOSIS — I4891 Unspecified atrial fibrillation: Secondary | ICD-10-CM | POA: Diagnosis not present

## 2015-02-28 DIAGNOSIS — G459 Transient cerebral ischemic attack, unspecified: Secondary | ICD-10-CM

## 2015-02-28 DIAGNOSIS — I10 Essential (primary) hypertension: Secondary | ICD-10-CM

## 2015-02-28 LAB — BASIC METABOLIC PANEL
BUN: 20 mg/dL (ref 6–23)
CALCIUM: 9.2 mg/dL (ref 8.4–10.5)
CO2: 23 mEq/L (ref 19–32)
CREATININE: 1.05 mg/dL (ref 0.40–1.50)
Chloride: 108 mEq/L (ref 96–112)
GFR: 72.59 mL/min (ref >60.00)
Glucose, Bld: 108 mg/dL — ABNORMAL HIGH (ref 70–99)
Potassium: 4.3 mEq/L (ref 3.5–5.1)
SODIUM: 139 meq/L (ref 135–145)

## 2015-02-28 LAB — CBC WITH DIFFERENTIAL/PLATELET
BASOS ABS: 0 10*3/uL (ref 0.0–0.1)
Basophils Relative: 0.7 % (ref 0.0–3.0)
EOS ABS: 0.1 10*3/uL (ref 0.0–0.7)
Eosinophils Relative: 2 % (ref 0.0–5.0)
HEMATOCRIT: 44.1 % (ref 39.0–52.0)
Hemoglobin: 14.9 g/dL (ref 13.0–17.0)
LYMPHS PCT: 34.5 % (ref 12.0–46.0)
Lymphs Abs: 1.7 10*3/uL (ref 0.7–4.0)
MCHC: 33.7 g/dL (ref 30.0–36.0)
MCV: 91.2 fl (ref 78.0–100.0)
MONOS PCT: 9.3 % (ref 3.0–12.0)
Monocytes Absolute: 0.5 10*3/uL (ref 0.1–1.0)
Neutro Abs: 2.7 10*3/uL (ref 1.4–7.7)
Neutrophils Relative %: 53.5 % (ref 43.0–77.0)
Platelets: 203 10*3/uL (ref 150.0–400.0)
RBC: 4.84 Mil/uL (ref 4.22–5.81)
RDW: 14 % (ref 11.5–15.5)
WBC: 5 10*3/uL (ref 4.0–10.5)

## 2015-02-28 LAB — PROTIME-INR
INR: 1.4 ratio — ABNORMAL HIGH (ref 0.8–1.0)
PROTHROMBIN TIME: 14.9 s — AB (ref 9.6–13.1)

## 2015-02-28 NOTE — Addendum Note (Signed)
Addended by: Eulis Foster on: 02/28/2015 07:56 AM   Modules accepted: Orders

## 2015-03-02 ENCOUNTER — Encounter (HOSPITAL_COMMUNITY)
Admission: RE | Disposition: A | Discharge status: Home / Self Care | Payer: Self-pay | Source: Ambulatory Visit | Attending: Cardiovascular Disease

## 2015-03-02 ENCOUNTER — Ambulatory Visit (HOSPITAL_COMMUNITY)
Admission: RE | Admit: 2015-03-02 | Discharge: 2015-03-02 | Disposition: A | Discharge status: Home / Self Care | Payer: Medicare HMO | Source: Ambulatory Visit | Attending: Cardiovascular Disease | Admitting: Cardiovascular Disease

## 2015-03-02 ENCOUNTER — Encounter (HOSPITAL_COMMUNITY): Payer: Self-pay | Admitting: Cardiovascular Disease

## 2015-03-02 DIAGNOSIS — I2510 Atherosclerotic heart disease of native coronary artery without angina pectoris: Secondary | ICD-10-CM

## 2015-03-02 DIAGNOSIS — I10 Essential (primary) hypertension: Secondary | ICD-10-CM | POA: Insufficient documentation

## 2015-03-02 DIAGNOSIS — Z7901 Long term (current) use of anticoagulants: Secondary | ICD-10-CM | POA: Diagnosis not present

## 2015-03-02 DIAGNOSIS — I4891 Unspecified atrial fibrillation: Secondary | ICD-10-CM | POA: Diagnosis not present

## 2015-03-02 DIAGNOSIS — I25110 Atherosclerotic heart disease of native coronary artery with unstable angina pectoris: Secondary | ICD-10-CM

## 2015-03-02 DIAGNOSIS — R0602 Shortness of breath: Secondary | ICD-10-CM | POA: Diagnosis not present

## 2015-03-02 DIAGNOSIS — I252 Old myocardial infarction: Secondary | ICD-10-CM | POA: Insufficient documentation

## 2015-03-02 DIAGNOSIS — Z955 Presence of coronary angioplasty implant and graft: Secondary | ICD-10-CM | POA: Insufficient documentation

## 2015-03-02 DIAGNOSIS — Z87891 Personal history of nicotine dependence: Secondary | ICD-10-CM | POA: Diagnosis not present

## 2015-03-02 DIAGNOSIS — E785 Hyperlipidemia, unspecified: Secondary | ICD-10-CM | POA: Diagnosis not present

## 2015-03-02 HISTORY — PX: CARDIAC CATHETERIZATION: SHX172

## 2015-03-02 SURGERY — LEFT HEART CATH AND CORONARY ANGIOGRAPHY

## 2015-03-02 MED ORDER — IOHEXOL 350 MG/ML SOLN
INTRAVENOUS | Status: DC | PRN
  Administered 2015-03-02: 70 mL via INTRAVENOUS

## 2015-03-02 MED ORDER — LIDOCAINE HCL (PF) 1 % IJ SOLN
INTRAMUSCULAR | Status: DC | PRN
  Administered 2015-03-02: 11:00:00

## 2015-03-02 MED ORDER — ASPIRIN 81 MG PO CHEW
CHEWABLE_TABLET | ORAL | Status: AC
  Filled 2015-03-02: qty 1

## 2015-03-02 MED ORDER — FENTANYL CITRATE (PF) 100 MCG/2ML IJ SOLN
INTRAMUSCULAR | Status: DC | PRN
  Administered 2015-03-02: 25 ug via INTRAVENOUS

## 2015-03-02 MED ORDER — HEPARIN SODIUM (PORCINE) 1000 UNIT/ML IJ SOLN
INTRAMUSCULAR | Status: AC
  Filled 2015-03-02: qty 1

## 2015-03-02 MED ORDER — ONDANSETRON HCL 4 MG/2ML IJ SOLN: 4.0000 mg | Freq: Four times a day (QID) | INTRAMUSCULAR | Status: DC | PRN

## 2015-03-02 MED ORDER — MIDAZOLAM HCL 2 MG/2ML IJ SOLN
INTRAMUSCULAR | Status: DC | PRN
  Administered 2015-03-02: 2 mg via INTRAVENOUS

## 2015-03-02 MED ORDER — VERAPAMIL HCL 2.5 MG/ML IV SOLN
INTRAVENOUS | Status: DC | PRN
  Administered 2015-03-02: 11:00:00 via INTRA_ARTERIAL

## 2015-03-02 MED ORDER — NITROGLYCERIN 1 MG/10 ML FOR IR/CATH LAB
INTRA_ARTERIAL | Status: AC
  Filled 2015-03-02: qty 10

## 2015-03-02 MED ORDER — HEPARIN SODIUM (PORCINE) 1000 UNIT/ML IJ SOLN
INTRAMUSCULAR | Status: DC | PRN
  Administered 2015-03-02: 5000 [IU] via INTRAVENOUS

## 2015-03-02 MED ORDER — FENTANYL CITRATE (PF) 100 MCG/2ML IJ SOLN
INTRAMUSCULAR | Status: AC
  Filled 2015-03-02: qty 4

## 2015-03-02 MED ORDER — SODIUM CHLORIDE 0.9 % IV SOLN
INTRAVENOUS | Status: AC
  Administered 2015-03-02: 09:00:00 via INTRAVENOUS

## 2015-03-02 MED ORDER — VERAPAMIL HCL 2.5 MG/ML IV SOLN
INTRAVENOUS | Status: AC
  Filled 2015-03-02: qty 2

## 2015-03-02 MED ORDER — MIDAZOLAM HCL 2 MG/2ML IJ SOLN
INTRAMUSCULAR | Status: AC
  Filled 2015-03-02: qty 4

## 2015-03-02 MED ORDER — SODIUM CHLORIDE 0.9 % IV SOLN: INTRAVENOUS | Status: DC

## 2015-03-02 MED ORDER — SODIUM CHLORIDE 0.9 % IV SOLN: 250.0000 mL | INTRAVENOUS | Status: DC | PRN

## 2015-03-02 MED ORDER — ASPIRIN 81 MG PO CHEW
81.0000 mg | CHEWABLE_TABLET | ORAL | Status: AC
  Administered 2015-03-02: 81 mg via ORAL

## 2015-03-02 MED ORDER — SODIUM CHLORIDE 0.9 % IJ SOLN: 3.0000 mL | INTRAMUSCULAR | Status: DC | PRN

## 2015-03-02 MED ORDER — SODIUM CHLORIDE 0.9 % IJ SOLN: 3.0000 mL | Freq: Two times a day (BID) | INTRAMUSCULAR | Status: DC

## 2015-03-02 MED ORDER — HEPARIN (PORCINE) IN NACL 2-0.9 UNIT/ML-% IJ SOLN
INTRAMUSCULAR | Status: AC
  Filled 2015-03-02: qty 1500

## 2015-03-02 MED ORDER — LIDOCAINE HCL (PF) 1 % IJ SOLN
INTRAMUSCULAR | Status: DC | PRN
  Administered 2015-03-02: 4 mL

## 2015-03-02 MED ORDER — LIDOCAINE HCL (PF) 1 % IJ SOLN
INTRAMUSCULAR | Status: AC
  Filled 2015-03-02: qty 30

## 2015-03-02 MED ORDER — ACETAMINOPHEN 325 MG PO TABS: 650.0000 mg | ORAL_TABLET | ORAL | Status: DC | PRN

## 2015-03-02 SURGICAL SUPPLY — 11 items

## 2015-03-02 NOTE — H&P (View-Only) (Signed)
Chief Complaint  Patient presents with  . Shortness of Breath    History of Present Illness: 80 yo white male with history of CAD, HTN, HLD and now atrial fibrillation here today for cardiac follow up. His cardiac history dates back to 2001. He had a bare-metal stent placed in the right coronary artery in 2001 with a staged intervention to the circumflex coronary artery with placement of a bare-metal stent in the mid circumflex vessel and balloon angioplasty of the ostium of the first obtuse marginal coronary artery at that time. Cardiac cath February 2013 with placement of two non-overlapping DES in the RCA and severe disease in the small distal Circumflex treated with balloon angioplasty only. Carotid artery dopplers 06/07/10 with mild bilateral disease. He was admitted to [facility] 11/30/14 with TIA type symptoms and was found to be in atrial fibrillation. He was started on Eliquis and atenolol was continued.   He is here today for follow up. He is still working full time. He notes dyspnea at rest. Weight is stable. No LE edema. No cough, fever, chills. No chest pain. He is not aware of palpitations.  He has no dizziness, near syncope or syncope. He thinks his dyspnea is similar to dyspnea before his last cath.   Primary Care Physician: Linna Darner  Last Lipid Profile:Lipid Panel     Component Value Date/Time   CHOL 153 11/30/2014 0415   TRIG 72 11/30/2014 0415   TRIG 80 05/07/2006 1036   HDL 47 11/30/2014 0415   CHOLHDL 3.3 11/30/2014 0415   CHOLHDL 2.9 CALC 05/07/2006 1036   VLDL 14 11/30/2014 0415   LDLCALC 92 11/30/2014 0415   Past Medical History  Diagnosis Date  . Unspecified essential hypertension   . HYPERLIPIDEMIA   . CORONARY ARTERY DISEASE     Plavix as of 07/30/11; Dr Angelena Form  . COLONIC POLYPS, HX OF     Dr Earlean Shawl  . DJD (degenerative joint disease)   . Myocardial infarction 08/1999  . Angina     Past Surgical History  Procedure Laterality Date  . Coronary angioplasty  with stent placement  2001; 07/30/11;    "2+3"  . Cardiac catheterization  ~ 2002  . Coronary angioplasty  07/30/11  . Cataract extraction w/ intraocular lens  implant, bilateral  ~ 2007    bilaterally  . Retinal detachment surgery  05/2008; 06/2008; 07/2008    left; "all 3 surgeries have failed"  . Inguinal hernia repair  2005    left  . Left heart catheterization with coronary angiogram N/A 07/30/2011    Procedure: LEFT HEART CATHETERIZATION WITH CORONARY ANGIOGRAM;  Surgeon: Burnell Blanks, MD;  Location: [facility] CATH LAB;  Service: Cardiovascular;  Laterality: N/A;  . Percutaneous coronary stent intervention (pci-s)  07/30/2011    Procedure: PERCUTANEOUS CORONARY STENT INTERVENTION (PCI-S);  Surgeon: Burnell Blanks, MD;  Location: [facility] CATH LAB;  Service: Cardiovascular;;    Current Outpatient Prescriptions  Medication Sig Dispense Refill  . atorvastatin (LIPITOR) 80 MG tablet Take 80 mg by mouth daily.    Marland Kitchen ezetimibe (ZETIA) 10 MG tablet Take 10 mg by mouth daily.    . folic acid (FOLVITE) 1 MG tablet Take 1 mg by mouth daily.    Marland Kitchen apixaban (ELIQUIS) 5 MG TABS tablet Take 1 tablet (5 mg total) by mouth 2 (two) times daily. 60 tablet 11  . atenolol (TENORMIN) 50 MG tablet Take 1 tablet (50 mg total) by mouth daily. 30 tablet 11  . atorvastatin (LIPITOR)  80 MG tablet TAKE 1 TABLET ONCE DAILY. 30 tablet 3  . DHA-EPA-VITAMIN E PO Take 2 tablets by mouth daily.    . Glucosamine HCl 1000 MG TABS Take 1 tablet by mouth daily.     . Ibuprofen-Diphenhydramine HCl (ADVIL PM) 200-25 MG CAPS Take 1 tablet by mouth daily.    Marland Kitchen MELATONIN PO Take 1 tablet by mouth at bedtime.    . Misc Natural Products (BLACK CHERRY CONCENTRATE PO) Take 400 mg by mouth daily. 2 BY MOUTH ONCE DAILY    . NITROSTAT 0.4 MG SL tablet DISSOLVE 1 TABLET UNDER TONGUE AS NEEDED FOR CHEST PAIN,MAY REPEAT IN5 MINUTES FOR 2 DOSES. 25 tablet 0  . Potassium Aminobenzoate (POTABA) 500 MG TABS Take 1 tablet by mouth daily.      . Saw Palmetto, Serenoa repens, (SAW PALMETTO PO) Take 400 mg by mouth daily.    . vitamin E 1000 UNIT capsule Take 2,000 Units by mouth daily.     No current facility-administered medications for this visit.    No Known Allergies  Social History   Social History  . Marital Status: Married    Spouse Name: N/A  . Number of Children: N/A  . Years of Education: N/A   Occupational History  . Not on file.   Social History Main Topics  . Smoking status: Former Smoker -- 0.50 packs/day for 40 years    Types: Cigarettes    Quit date: 06/12/1999  . Smokeless tobacco: Never Used  . 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 Drug Use: No  . Sexual Activity: Not Currently   Other Topics Concern  . Not on file   Social History Narrative    Family History  Problem Relation Age of Onset  . Colon cancer Father     in 28s  . Prostate cancer Father   . Alcohol abuse Mother     Review of Systems:  As stated in the HPI and otherwise negative.   BP 110/70 mmHg  Pulse 51  Ht 6' (1.829 m)  Wt 213 lb 3.2 oz (96.707 kg)  BMI 28.91 kg/m2  SpO2 98%  Physical Examination: General: Well developed, well nourished, NAD HEENT: OP clear, mucus membranes moist SKIN: warm, dry. No rashes. Neuro: No focal deficits Musculoskeletal: Muscle strength 5/5 all ext Psychiatric: Mood and affect normal Neck: No JVD, no carotid bruits, no thyromegaly, no lymphadenopathy. Lungs:Clear bilaterally, no wheezes, rhonci, crackles Cardiovascular: Regular rate and rhythm. No murmurs, gallops or rubs. Abdomen:Soft. Bowel sounds present. Non-tender.  Extremities: No lower extremity edema. Pulses are 2 + in the bilateral DP/PT.  EKG:  EKG is not ordered today. The ekg ordered today demonstrates   Recent Labs: 11/29/2014: ALT 32 11/30/2014: BUN 13; Creatinine, Ser 1.00; Hemoglobin 14.3; Platelets 182; Potassium 4.1; Sodium 140   Lipid Panel    Component Value  Date/Time   CHOL 153 11/30/2014 0415   TRIG 72 11/30/2014 0415   TRIG 80 05/07/2006 1036   HDL 47 11/30/2014 0415   CHOLHDL 3.3 11/30/2014 0415   CHOLHDL 2.9 CALC 05/07/2006 1036   VLDL 14 11/30/2014 0415   LDLCALC 92 11/30/2014 0415     Wt Readings from Last 3 Encounters:  02/18/15 213 lb 3.2 oz (96.707 kg)  12/16/14 210 lb 8.6 oz (95.5 kg)  12/10/14 212 lb (96.163 kg)     Other studies Reviewed: Additional studies/ records that were reviewed today include: . Review of the  above records demonstrates:   Assessment and Plan:   1. CAD with unstable angina: He is having recurrent dyspnea. It is not clear if this is his anginal equivalent. The dyspnea may be due to his atrial fibrillation. We have discussed a cardiac cath. He wishes to pursue this but not at this time as he has travel plans. He will call back to arrange. I have reviewed risks and benefits of the procediure. Will continue current meds including statin, Zetia and beta blocker. He is no longer on ASA and Plavix since he has been started on Eliquis.   2. Carotid artery disease: Mild disease by dopplers June 2016.   3. HTN: BP controlled. No changes.   4. HLD: Lipids well controlled. Continue statin.   5. Atrial fibrillation, paroxysmal: Still in atrial fib. Continue beta blocker and Eliquis. He is asymptomatic so will not plan DCCV.   Current medicines are reviewed at length with the patient today.  The patient does not have concerns regarding medicines.  The following changes have been made:  no change  Labs/ tests ordered today include:  No orders of the defined types were placed in this encounter.    Disposition:   FU with me in 8 weeks  Signed, Lauree Chandler, MD 02/18/2015 4:37 PM    [facility] [place], [place], [place]  [address] Phone: [phone]; Fax: [phone]

## 2015-03-02 NOTE — Interval H&P Note (Signed)
History and Physical Interval Note:  03/02/2015 10:49 AM  Natalia Leatherwood  has presented today for surgery, with the diagnosis of shortness of breath  The various methods of treatment have been discussed with the patient and family. After consideration of risks, benefits and other options for treatment, the patient has consented to  Procedure(s): Left Heart Cath and Coronary Angiography (N/A) as a surgical intervention .  The patient's history has been reviewed, patient examined, no change in status, stable for surgery.  I have reviewed the patient's chart and labs.  Questions were answered to the patient's satisfaction.    Cath Lab Visit (complete for each Cath Lab visit)  Clinical Evaluation Leading to the Procedure:   ACS: No.  Non-ACS:    Anginal Classification: CCS II  Anti-ischemic medical therapy: Minimal Therapy (1 class of medications)  Non-Invasive Test Results: No non-invasive testing performed  Prior CABG: No previous CABG       Sherren Mocha

## 2015-03-02 NOTE — Discharge Instructions (Signed)
Radial Site Care °Refer to this sheet in the next few weeks. These instructions provide you with information on caring for yourself after your procedure. Your caregiver may also give you more specific instructions. Your treatment has been planned according to current medical practices, but problems sometimes occur. Call your caregiver if you have any problems or questions after your procedure. °HOME CARE INSTRUCTIONS °· You may shower the day after the procedure. Remove the bandage (dressing) and gently wash the site with plain soap and water. Gently pat the site dry. °· Do not apply powder or lotion to the site. °· Do not submerge the affected site in water for 3 to 5 days. °· Inspect the site at least twice daily. °· Do not flex or bend the affected arm for 24 hours. °· No lifting over 5 pounds (2.3 kg) for 5 days after your procedure. °· Do not drive home if you are discharged the same day of the procedure. Have someone else drive you. °· You may drive 24 hours after the procedure unless otherwise instructed by your caregiver. °· Do not operate machinery or power tools for 24 hours. °· A responsible adult should be with you for the first 24 hours after you arrive home. °What to expect: °· Any bruising will usually fade within 1 to 2 weeks. °· Blood that collects in the tissue (hematoma) may be painful to the touch. It should usually decrease in size and tenderness within 1 to 2 weeks. °SEEK IMMEDIATE MEDICAL CARE IF: °· You have unusual pain at the radial site. °· You have redness, warmth, swelling, or pain at the radial site. °· You have drainage (other than a small amount of blood on the dressing). °· You have chills. °· You have a fever or persistent symptoms for more than 72 hours. °· You have a fever and your symptoms suddenly get worse. °· Your arm becomes pale, cool, tingly, or numb. °· You have heavy bleeding from the site. Hold pressure on the site. °Document Released: 06/30/2010 Document Revised:  08/20/2011 Document Reviewed: 06/30/2010 °[employer]® Patient Information ©2015 [employer]. This information is not intended to replace advice given to you by your health care provider. Make sure you discuss any questions you have with your health care provider. ° °

## 2015-04-04 ENCOUNTER — Other Ambulatory Visit: Payer: Self-pay | Admitting: Cardiovascular Disease

## 2015-11-26 ENCOUNTER — Other Ambulatory Visit: Payer: Self-pay | Admitting: Cardiovascular Disease

## 2016-01-05 ENCOUNTER — Other Ambulatory Visit: Payer: Self-pay | Admitting: Cardiovascular Disease

## 2016-02-03 ENCOUNTER — Encounter: Payer: Self-pay | Admitting: Cardiovascular Disease

## 2016-02-03 ENCOUNTER — Other Ambulatory Visit: Payer: Medicare HMO

## 2016-02-03 ENCOUNTER — Other Ambulatory Visit: Payer: Self-pay

## 2016-02-03 DIAGNOSIS — E785 Hyperlipidemia, unspecified: Secondary | ICD-10-CM

## 2016-02-03 DIAGNOSIS — I2510 Atherosclerotic heart disease of native coronary artery without angina pectoris: Secondary | ICD-10-CM

## 2016-02-06 ENCOUNTER — Other Ambulatory Visit: Payer: Medicare HMO | Admitting: *Deleted

## 2016-02-06 DIAGNOSIS — I2510 Atherosclerotic heart disease of native coronary artery without angina pectoris: Secondary | ICD-10-CM

## 2016-02-06 DIAGNOSIS — E785 Hyperlipidemia, unspecified: Secondary | ICD-10-CM

## 2016-02-06 LAB — COMPREHENSIVE METABOLIC PANEL
ALBUMIN: 4.1 g/dL (ref 3.6–5.1)
ALK PHOS: 59 U/L (ref 40–115)
ALT: 21 U/L (ref 9–46)
AST: 16 U/L (ref 10–35)
BILIRUBIN TOTAL: 0.7 mg/dL (ref 0.2–1.2)
BUN: 20 mg/dL (ref 7–25)
CALCIUM: 9.3 mg/dL (ref 8.6–10.3)
CO2: 23 mmol/L (ref 20–31)
Chloride: 109 mmol/L (ref 98–110)
Creat: 1.09 mg/dL (ref 0.70–1.18)
GLUCOSE: 109 mg/dL — AB (ref 65–99)
Potassium: 4.5 mmol/L (ref 3.5–5.3)
Sodium: 142 mmol/L (ref 135–146)
TOTAL PROTEIN: 6.3 g/dL (ref 6.1–8.1)

## 2016-02-06 LAB — LIPID PANEL
Cholesterol: 136 mg/dL (ref 125–200)
HDL: 62 mg/dL (ref >=40)
LDL Cholesterol: 62 mg/dL (ref <130)
Total CHOL/HDL Ratio: 2.2 Ratio (ref <=5.0)
Triglycerides: 62 mg/dL (ref <150)
VLDL: 12 mg/dL (ref <30)

## 2016-02-07 ENCOUNTER — Ambulatory Visit (INDEPENDENT_AMBULATORY_CARE_PROVIDER_SITE_OTHER): Payer: Medicare HMO | Admitting: Cardiovascular Disease

## 2016-02-07 ENCOUNTER — Encounter: Payer: Self-pay | Admitting: Cardiovascular Disease

## 2016-02-07 VITALS — BP 142/84 | HR 73 | Ht 72.0 in | Wt 216.8 lb

## 2016-02-07 DIAGNOSIS — I481 Persistent atrial fibrillation: Secondary | ICD-10-CM

## 2016-02-07 DIAGNOSIS — I739 Peripheral vascular disease, unspecified: Secondary | ICD-10-CM

## 2016-02-07 DIAGNOSIS — I779 Disorder of arteries and arterioles, unspecified: Secondary | ICD-10-CM

## 2016-02-07 DIAGNOSIS — I4819 Other persistent atrial fibrillation: Secondary | ICD-10-CM

## 2016-02-07 DIAGNOSIS — E7800 Pure hypercholesterolemia, unspecified: Secondary | ICD-10-CM | POA: Diagnosis not present

## 2016-02-07 DIAGNOSIS — I2510 Atherosclerotic heart disease of native coronary artery without angina pectoris: Secondary | ICD-10-CM

## 2016-02-07 DIAGNOSIS — I10 Essential (primary) hypertension: Secondary | ICD-10-CM

## 2016-02-07 NOTE — Progress Notes (Signed)
Chief Complaint  Patient presents with  . Shortness of Breath    History of Present Illness: 81 yo white male with history of CAD, HTN, HLD and atrial fibrillation here today for cardiac follow up. His cardiac history dates back to 2001. He had a bare-metal stent placed in the right coronary artery in 2001 with a staged intervention to the circumflex coronary artery with placement of a bare-metal stent in the mid circumflex vessel and balloon angioplasty of the ostium of the first obtuse marginal coronary artery at that time. Cardiac cath February 2013 with placement of two non-overlapping DES in the RCA and severe disease in the small distal Circumflex treated with balloon angioplasty only.  He was admitted to [facility] 11/30/14 with TIA type symptoms and was found to be in atrial fibrillation. He was started on Eliquis and atenolol was continued. Carotid artery dopplers 2016 with mild bilateral disease. 48 Hour monitor June 2016 with persistent atrial fibrillation, rare PVCs. Echo June 2016 with normal LV size and function, no valve issues. He was seen in my office September 2016 with worsened dyspnea. Cardiac cath 03/02/15 with stable CAD.   He is here today for follow up. He is still working full time. He has no changes in baseline mild dyspnea but this does not slow him down.  No LE edema. No cough, fever, chills. No chest pain. He is not aware of palpitations.  He has no dizziness, near syncope or syncope.   Primary Care Physician: Formerly Linna Darner   Past Medical History:  Diagnosis Date  . Angina   . Atrial fibrillation ([place])   . COLONIC POLYPS, HX OF    Dr Earlean Shawl  . CORONARY ARTERY DISEASE    Plavix as of 07/30/11; Dr Angelena Form  . DJD (degenerative joint disease)   . HYPERLIPIDEMIA   . Myocardial infarction ([place]) 08/1999  . Shortness of breath 03/02/2015  . Unspecified essential hypertension     Past Surgical History:  Procedure Laterality Date  . CARDIAC CATHETERIZATION  ~ 2002  .  CARDIAC CATHETERIZATION N/A 03/02/2015   Procedure: Left Heart Cath and Coronary Angiography;  Surgeon: Sherren Mocha, MD;  Location: [facility];  Service: Cardiovascular;  Laterality: N/A;  . CATARACT EXTRACTION W/ INTRAOCULAR LENS  IMPLANT, BILATERAL  ~ 2007   bilaterally  . CORONARY ANGIOPLASTY  07/30/11  . CORONARY ANGIOPLASTY WITH STENT PLACEMENT  2001; 07/30/11;   "2+3"  . INGUINAL HERNIA REPAIR  2005   left  . LEFT HEART CATHETERIZATION WITH CORONARY ANGIOGRAM N/A 07/30/2011   Procedure: LEFT HEART CATHETERIZATION WITH CORONARY ANGIOGRAM;  Surgeon: Burnell Blanks, MD;  Location: [facility] CATH LAB;  Service: Cardiovascular;  Laterality: N/A;  . PERCUTANEOUS CORONARY STENT INTERVENTION (PCI-S)  07/30/2011   Procedure: PERCUTANEOUS CORONARY STENT INTERVENTION (PCI-S);  Surgeon: Burnell Blanks, MD;  Location: [facility] CATH LAB;  Service: Cardiovascular;;  . RETINAL DETACHMENT SURGERY  05/2008; 06/2008; 07/2008   left; "all 3 surgeries have failed"    Current Outpatient Prescriptions  Medication Sig Dispense Refill  . atenolol (TENORMIN) 50 MG tablet Take 1 tablet (50 mg total) by mouth daily. 30 tablet 3  . atorvastatin (LIPITOR) 80 MG tablet Take 80 mg by mouth daily.    Marland Kitchen ELIQUIS 5 MG TABS tablet TAKE 1 TABLET TWICE DAILY. 60 tablet 1  . folic acid (FOLVITE) 1 MG tablet Take 1 mg by mouth daily.    . Glucosamine HCl 1000 MG TABS Take 1 tablet by mouth daily.     Marland Kitchen  Ibuprofen-Diphenhydramine HCl (ADVIL PM) 200-25 MG CAPS Take 1 tablet by mouth at bedtime as needed.     Marland Kitchen MELATONIN PO Take 1 tablet by mouth at bedtime.    . Misc Natural Products (BLACK CHERRY CONCENTRATE PO) Take 400 mg by mouth daily. 2 BY MOUTH ONCE DAILY    . NITROSTAT 0.4 MG SL tablet DISSOLVE 1 TABLET UNDER TONGUE AS NEEDED FOR CHEST PAIN,MAY REPEAT IN5 MINUTES FOR 2 DOSES. 25 tablet 5  . Potassium Aminobenzoate (POTABA) 500 MG TABS Take 1 tablet by mouth daily.    . Saw Palmetto, Serenoa repens, (SAW  PALMETTO PO) Take 400 mg by mouth daily.    . vitamin E 1000 UNIT capsule Take 2,000 Units by mouth daily.    Marland Kitchen ZETIA 10 MG tablet TAKE (1) TABLET DAILY AT BEDTIME. (Patient taking differently: TAKE (1) TABLET DAILY AT DAILY) 30 tablet 11   No current facility-administered medications for this visit.     No Known Allergies  Social History   Social History  . Marital status: Married    Spouse name: N/A  . Number of children: N/A  . Years of education: N/A   Occupational History  . Not on file.   Social History Main Topics  . Smoking status: Former Smoker    Packs/day: 0.50    Years: 40.00    Types: Cigarettes    Quit date: 06/12/1999  . Smokeless tobacco: Never Used  . 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 Drug use: No  . Sexual activity: Not Currently   Other Topics Concern  . Not on file   Social History Narrative  . No narrative on file    Family History  Problem Relation Age of Onset  . Colon cancer Father     in 25s  . Prostate cancer Father   . Alcohol abuse Mother     Review of Systems:  As stated in the HPI and otherwise negative.   BP (!) 142/84   Pulse 73   Ht 6' (1.829 m)   Wt 216 lb 12.8 oz (98.3 kg)   SpO2 94%   BMI 29.40 kg/m   Physical Examination: General: Well developed, well nourished, NAD  HEENT: OP clear, mucus membranes moist  SKIN: warm, dry. No rashes. Neuro: No focal deficits  Musculoskeletal: Muscle strength 5/5 all ext  Psychiatric: Mood and affect normal  Neck: No JVD, no carotid bruits, no thyromegaly, no lymphadenopathy.  Lungs:Clear bilaterally, no wheezes, rhonci, crackles Cardiovascular: Irreg irreg No murmurs, gallops or rubs. Abdomen:Soft. Bowel sounds present. Non-tender.  Extremities: No lower extremity edema. Pulses are 2 + in the bilateral DP/PT.  Cardiac cath 03/02/15:  Prox RCA lesion, 20% stenosed. The lesion was previously treated with a drug-eluting stent .  Dist  RCA lesion, 10% stenosed. The lesion was previously treated with a drug-eluting stent .  Mid RCA lesion, 30% stenosed.  Mid Cx lesion, 50% stenosed. The lesion was previously treated with a stent (unknown type) .  3rd Mrg lesion, 90% stenosed.  2nd Mrg lesion, 70% stenosed.  Prox LAD lesion, 30% stenosed.  Mid LAD lesion, 30% stenosed.  The left ventricular systolic function is normal.   1. Stable CAD with continued patency of the stented segments in the RCA, mild stenosis of the LCx stent, Mild diffuse nonobstructive stenosis of the LAD, and severe distal stenosis of small OM subranches 2. Normal LV function  Echo June 2016: Left ventricle: The  cavity size was normal. There was mild   concentric hypertrophy. Systolic function was vigorous. The   estimated ejection fraction was in the range of 65% to 70%. Wall   motion was normal; there were no regional wall motion   abnormalities. There was no evidence of elevated ventricular   filling pressure by Doppler parameters.  Impressions:  - No cardiac source of emboli was indentified.  EKG:  EKG is ordered today. The ekg ordered today demonstrates Atrial fib, rate 61 bpm.   Recent Labs: 02/28/2015: Hemoglobin 14.9; Platelets 203.0 02/06/2016: ALT 21; BUN 20; Creat 1.09; Potassium 4.5; Sodium 142   Lipid Panel    Component Value Date/Time   CHOL 136 02/06/2016 0745   TRIG 62 02/06/2016 0745   TRIG 80 05/07/2006 1036   HDL 62 02/06/2016 0745   CHOLHDL 2.2 02/06/2016 0745   VLDL 12 02/06/2016 0745   LDLCALC 62 02/06/2016 0745     Wt Readings from Last 3 Encounters:  02/07/16 216 lb 12.8 oz (98.3 kg)  03/02/15 211 lb (95.7 kg)  02/18/15 213 lb 3.2 oz (96.7 kg)     Other studies Reviewed: Additional studies/ records that were reviewed today include: . Review of the above records demonstrates:   Assessment and Plan:   1. CAD: No chest pain suggestive of angina. Cardiac cath September 2016 with stable CAD.  Will  continue current meds including statin, Zetia and beta blocker. He is no longer on ASA and Plavix since he has been started on Eliquis.   2. Carotid artery disease: Mild disease by dopplers June 2016.   3. HTN: BP controlled. No changes.   4. HLD: Lipids well controlled. I reviewed the results with him today. Continue statin.   5. Atrial fibrillation, persistent: Still in atrial fib. Continue beta blocker and Eliquis. He is asymptomatic so will not plan DCCV.   Current medicines are reviewed at length with the patient today.  The patient does not have concerns regarding medicines.  The following changes have been made:  no change  Labs/ tests ordered today include:   Orders Placed This Encounter  Procedures  . EKG 12-Lead    Disposition:   FU with me in 8 weeks  Signed, Lauree Chandler, MD 02/07/2016 8:53 AM    [facility] [address], [place], [place]  [address] Phone: [phone]; Fax: [phone]

## 2016-02-07 NOTE — Patient Instructions (Signed)

## 2016-03-20 DIAGNOSIS — R69 Illness, unspecified: Secondary | ICD-10-CM | POA: Diagnosis not present

## 2016-04-07 DIAGNOSIS — R69 Illness, unspecified: Secondary | ICD-10-CM | POA: Diagnosis not present

## 2016-04-16 ENCOUNTER — Other Ambulatory Visit: Payer: Self-pay | Admitting: Cardiovascular Disease

## 2016-04-30 ENCOUNTER — Other Ambulatory Visit: Payer: Self-pay | Admitting: Cardiovascular Disease

## 2016-05-18 DIAGNOSIS — R05 Cough: Secondary | ICD-10-CM | POA: Diagnosis not present

## 2016-06-05 ENCOUNTER — Other Ambulatory Visit: Payer: Self-pay | Admitting: Cardiovascular Disease

## 2016-06-26 ENCOUNTER — Other Ambulatory Visit: Payer: Self-pay | Admitting: Cardiovascular Disease

## 2016-08-07 ENCOUNTER — Other Ambulatory Visit: Payer: Self-pay | Admitting: Cardiovascular Disease

## 2016-09-26 NOTE — Progress Notes (Signed)
Chief Complaint  Patient presents with  . Follow-up    History of Present Illness: 82 yo white male with history of CAD, HTN, HLD and atrial fibrillation here today for cardiac follow up. His cardiac history dates back to 2001 when he had a bare-metal stent placed in the right coronary artery and a bare metal stent placed in the Circumflex artery as well as balloon angioplasty of the ostium of the first obtuse marginal coronary artery at that time. Cardiac cath February 2013 with placement of two non-overlapping drug eluting stents in the RCA and severe disease in the small distal Circumflex treated with balloon angioplasty only.  He was admitted to [facility] 2016 with TIA type symptoms and was found to be in atrial fibrillation. He was started on Eliquis and atenolol was continued. Carotid artery dopplers 2016 with mild bilateral disease. 48 Hour monitor June 2016 with persistent atrial fibrillation, rare PVCs. Echo June 2016 with normal LV size and function, no valve issues. He was seen in my office September 2016 with worsened dyspnea. Cardiac cath 03/02/15 with stable CAD.   He is here today for follow up. The patient denies any chest pain, dyspnea, palpitations, lower extremity edema, orthopnea, PND, dizziness, near syncope or syncope. He is exercising several days week.     Primary Care Physician: No PCP Per Patient     Past Medical History:  Diagnosis Date  . Angina   . Atrial fibrillation ([place])   . COLONIC POLYPS, HX OF    Dr Earlean Shawl  . CORONARY ARTERY DISEASE    Plavix as of 07/30/11; Dr Angelena Form  . DJD (degenerative joint disease)   . HYPERLIPIDEMIA   . Myocardial infarction ([place]) 08/1999  . Shortness of breath 03/02/2015  . Unspecified essential hypertension     Past Surgical History:  Procedure Laterality Date  . CARDIAC CATHETERIZATION  ~ 2002  . CARDIAC CATHETERIZATION N/A 03/02/2015   Procedure: Left Heart Cath and Coronary Angiography;  Surgeon: Sherren Mocha, MD;   Location: [place];  Service: Cardiovascular;  Laterality: N/A;  . CATARACT EXTRACTION W/ INTRAOCULAR LENS  IMPLANT, BILATERAL  ~ 2007   bilaterally  . CORONARY ANGIOPLASTY  07/30/11  . CORONARY ANGIOPLASTY WITH STENT PLACEMENT  2001; 07/30/11;   "2+3"  . INGUINAL HERNIA REPAIR  2005   left  . LEFT HEART CATHETERIZATION WITH CORONARY ANGIOGRAM N/A 07/30/2011   Procedure: LEFT HEART CATHETERIZATION WITH CORONARY ANGIOGRAM;  Surgeon: Burnell Blanks, MD;  Location: [facility] CATH LAB;  Service: Cardiovascular;  Laterality: N/A;  . PERCUTANEOUS CORONARY STENT INTERVENTION (PCI-S)  07/30/2011   Procedure: PERCUTANEOUS CORONARY STENT INTERVENTION (PCI-S);  Surgeon: Burnell Blanks, MD;  Location: [facility] CATH LAB;  Service: Cardiovascular;;  . RETINAL DETACHMENT SURGERY  05/2008; 06/2008; 07/2008   left; "all 3 surgeries have failed"    Current Outpatient Prescriptions  Medication Sig Dispense Refill  . apixaban (ELIQUIS) 5 MG TABS tablet Take 1 tablet (5 mg total) by mouth 2 (two) times daily. 60 tablet 8  . atenolol (TENORMIN) 50 MG tablet Take 1 tablet (50 mg total) by mouth daily. 30 tablet 4  . atorvastatin (LIPITOR) 80 MG tablet Take 1 tablet (80 mg total) by mouth daily. 30 tablet 8  . ezetimibe (ZETIA) 10 MG tablet Take 1 tablet (10 mg total) by mouth daily. 30 tablet 8  . folic acid (FOLVITE) 1 MG tablet Take 1 mg by mouth daily.    . Glucosamine HCl 1000 MG TABS Take  1 tablet by mouth daily.     Marland Kitchen MELATONIN PO Take 1 tablet by mouth as needed.     . Misc Natural Products (BLACK CHERRY CONCENTRATE PO) Take 400 mg by mouth daily. 2 BY MOUTH ONCE DAILY    . NITROSTAT 0.4 MG SL tablet DISSOLVE 1 TABLET UNDER TONGUE AS NEEDED FOR CHEST PAIN,MAY REPEAT IN5 MINUTES FOR 2 DOSES. 25 tablet 0  . Potassium Aminobenzoate (POTABA) 500 MG TABS Take 1 tablet by mouth daily.    . Saw Palmetto, Serenoa repens, (SAW PALMETTO PO) Take 400 mg by mouth daily.    . vitamin B-12 (CYANOCOBALAMIN)  1000 MCG tablet Take 1,000 mcg by mouth daily.    . vitamin E 1000 UNIT capsule Take 2,000 Units by mouth daily.     No current facility-administered medications for this visit.     No Known Allergies  Social History   Social History  . Marital status: Married    Spouse name: N/A  . Number of children: N/A  . Years of education: N/A   Occupational History  . Not on file.   Social History Main Topics  . Smoking status: Former Smoker    Packs/day: 0.50    Years: 40.00    Types: Cigarettes    Quit date: 06/12/1999  . Smokeless tobacco: Never Used  . Alcohol use No     Comment: 07/30/11 "recovering alcoholic; since 10/18/9355"  . Drug use: No  . Sexual activity: Not Currently   Other Topics Concern  . Not on file   Social History Narrative  . No narrative on file    Family History  Problem Relation Age of Onset  . Colon cancer Father     in 22s  . Prostate cancer Father   . Alcohol abuse Mother     Review of Systems:  As stated in the HPI and otherwise negative.   BP 122/82   Pulse 68   Ht 6' (1.829 m)   Wt 206 lb 3.2 oz (93.5 kg)   SpO2 98%   BMI 27.97 kg/m   Physical Examination: General: Well developed, well nourished, NAD  HEENT: OP clear, mucus membranes moist  SKIN: warm, dry. No rashes. Neuro: No focal deficits  Musculoskeletal: Muscle strength 5/5 all ext  Psychiatric: Mood and affect normal  Neck: No JVD, no carotid bruits, no thyromegaly, no lymphadenopathy.  Lungs:Clear bilaterally, no wheezes, rhonci, crackles Cardiovascular: Regular rate and rhythm. No murmurs, gallops or rubs. Abdomen:Soft. Bowel sounds present. Non-tender.  Extremities: No lower extremity edema. Pulses are 2 + in the bilateral DP/PT.  Cardiac cath 03/02/15:  Prox RCA lesion, 20% stenosed. The lesion was previously treated with a drug-eluting stent .  Dist RCA lesion, 10% stenosed. The lesion was previously treated with a drug-eluting stent .  Mid RCA lesion, 30%  stenosed.  Mid Cx lesion, 50% stenosed. The lesion was previously treated with a stent (unknown type) .  3rd Mrg lesion, 90% stenosed.  2nd Mrg lesion, 70% stenosed.  Prox LAD lesion, 30% stenosed.  Mid LAD lesion, 30% stenosed.  The left ventricular systolic function is normal.   1. Stable CAD with continued patency of the stented segments in the RCA, mild stenosis of the LCx stent, Mild diffuse nonobstructive stenosis of the LAD, and severe distal stenosis of small OM subranches 2. Normal LV function  Echo June 2016: Left ventricle: The cavity size was normal. There was mild   concentric hypertrophy. Systolic function was vigorous. The  estimated ejection fraction was in the range of 65% to 70%. Wall   motion was normal; there were no regional wall motion   abnormalities. There was no evidence of elevated ventricular   filling pressure by Doppler parameters.  Impressions:  - No cardiac source of emboli was indentified.  EKG:  EKG is not ordered today. The ekg ordered today demonstrates   Recent Labs: 02/06/2016: ALT 21; BUN 20; Creat 1.09; Potassium 4.5; Sodium 142   Lipid Panel    Component Value Date/Time   CHOL 136 02/06/2016 0745   TRIG 62 02/06/2016 0745   TRIG 80 05/07/2006 1036   HDL 62 02/06/2016 0745   CHOLHDL 2.2 02/06/2016 0745   VLDL 12 02/06/2016 0745   LDLCALC 62 02/06/2016 0745     Wt Readings from Last 3 Encounters:  09/27/16 206 lb 3.2 oz (93.5 kg)  02/07/16 216 lb 12.8 oz (98.3 kg)  03/02/15 211 lb (95.7 kg)     Other studies Reviewed: Additional studies/ records that were reviewed today include: . Review of the above records demonstrates:   Assessment and Plan:   1. CAD without angina: He is having no chest pain suggestive of angina. Last cardiac cath September 2016 with stable CAD. He is tolerating a statin, Zetia and a beta blocker. He is not on ASA or Plavix since he is on Eliquis for atrial fib.    2. Carotid artery disease: Mild  disease by dopplers June 2016. I will plan on repeating his carotid dopplers in June of 2019.   3. HTN: His BP is controlled. Continue current therapy.    4. HLD: LDL at goal August 2017. Will continue statin and Zetia. Repeat fasting lipids, BMET and LFTS August 2018.    5. Atrial fibrillation, persistent: He is in atrial fib today. Rate controlled on beta blocker. Continue beta blocker. He is anti-coagulation on Eliquis. He is asymptomatic so will not plan DCCV.   Current medicines are reviewed at length with the patient today.  The patient does not have concerns regarding medicines.  The following changes have been made:  no change  Labs/ tests ordered today include:   Orders Placed This Encounter  Procedures  . Comp Met (CMET)  . Lipid Profile    Disposition:   FU with me in 12 months.   Signed, Lauree Chandler, MD 09/27/2016 9:01 AM    [facility] [address], [place], [place]  [address] Phone: [phone]; Fax: [phone]

## 2016-09-27 ENCOUNTER — Ambulatory Visit (INDEPENDENT_AMBULATORY_CARE_PROVIDER_SITE_OTHER): Payer: Medicare HMO | Admitting: Cardiovascular Disease

## 2016-09-27 ENCOUNTER — Encounter: Payer: Self-pay | Admitting: Cardiovascular Disease

## 2016-09-27 VITALS — BP 122/82 | HR 68 | Ht 72.0 in | Wt 206.2 lb

## 2016-09-27 DIAGNOSIS — I2510 Atherosclerotic heart disease of native coronary artery without angina pectoris: Secondary | ICD-10-CM | POA: Diagnosis not present

## 2016-09-27 DIAGNOSIS — E7800 Pure hypercholesterolemia, unspecified: Secondary | ICD-10-CM

## 2016-09-27 DIAGNOSIS — I779 Disorder of arteries and arterioles, unspecified: Secondary | ICD-10-CM

## 2016-09-27 DIAGNOSIS — I4819 Other persistent atrial fibrillation: Secondary | ICD-10-CM

## 2016-09-27 DIAGNOSIS — I10 Essential (primary) hypertension: Secondary | ICD-10-CM

## 2016-09-27 DIAGNOSIS — I739 Peripheral vascular disease, unspecified: Secondary | ICD-10-CM

## 2016-09-27 DIAGNOSIS — I481 Persistent atrial fibrillation: Secondary | ICD-10-CM

## 2016-09-27 NOTE — Patient Instructions (Signed)
Medication Instructions:  Your physician recommends that you continue on your current medications as directed. Please refer to the Current Medication list given to you today.   Labwork: Your physician recommends that you return for lab work on August 29,2018.  This will be fasting. The lab opens at 7:30 AM   Testing/Procedures: none  Follow-Up: Your physician recommends that you schedule a follow-up appointment in: 12 months. Please call our office in about 9 months to schedule this appointment.     Any Other Special Instructions Will Be Listed Below (If Applicable).     If you need a refill on your cardiac medications before your next appointment, please call your pharmacy.

## 2016-10-19 DIAGNOSIS — J069 Acute upper respiratory infection, unspecified: Secondary | ICD-10-CM | POA: Diagnosis not present

## 2017-01-09 ENCOUNTER — Encounter: Payer: Self-pay | Admitting: Cardiovascular Disease

## 2017-02-06 ENCOUNTER — Other Ambulatory Visit: Payer: Medicare HMO | Admitting: *Deleted

## 2017-02-06 DIAGNOSIS — I2510 Atherosclerotic heart disease of native coronary artery without angina pectoris: Secondary | ICD-10-CM | POA: Diagnosis not present

## 2017-02-06 DIAGNOSIS — E7800 Pure hypercholesterolemia, unspecified: Secondary | ICD-10-CM | POA: Diagnosis not present

## 2017-02-06 LAB — LIPID PANEL
CHOLESTEROL TOTAL: 213 mg/dL — AB (ref 100–199)
Chol/HDL Ratio: 3.7 ratio (ref 0.0–5.0)
HDL: 57 mg/dL (ref >39)
LDL Calculated: 130 mg/dL — ABNORMAL HIGH (ref 0–99)
TRIGLYCERIDES: 132 mg/dL (ref 0–149)
VLDL CHOLESTEROL CAL: 26 mg/dL (ref 5–40)

## 2017-02-06 LAB — COMPREHENSIVE METABOLIC PANEL
A/G RATIO: 2.1 (ref 1.2–2.2)
ALK PHOS: 66 IU/L (ref 39–117)
ALT: 22 IU/L (ref 0–44)
AST: 20 IU/L (ref 0–40)
Albumin: 4.2 g/dL (ref 3.5–4.7)
BILIRUBIN TOTAL: 0.5 mg/dL (ref 0.0–1.2)
BUN/Creatinine Ratio: 18 (ref 10–24)
BUN: 19 mg/dL (ref 8–27)
CO2: 21 mmol/L (ref 20–29)
Calcium: 9.2 mg/dL (ref 8.6–10.2)
Chloride: 104 mmol/L (ref 96–106)
Creatinine, Ser: 1.05 mg/dL (ref 0.76–1.27)
GFR calc non Af Amer: 67 mL/min/{1.73_m2} (ref >59)
GFR, EST AFRICAN AMERICAN: 77 mL/min/{1.73_m2} (ref >59)
GLUCOSE: 89 mg/dL (ref 65–99)
Globulin, Total: 2 g/dL (ref 1.5–4.5)
POTASSIUM: 4.6 mmol/L (ref 3.5–5.2)
Sodium: 140 mmol/L (ref 134–144)
TOTAL PROTEIN: 6.2 g/dL (ref 6.0–8.5)

## 2017-02-08 ENCOUNTER — Telehealth: Payer: Self-pay | Admitting: Cardiovascular Disease

## 2017-02-08 DIAGNOSIS — E7849 Other hyperlipidemia: Secondary | ICD-10-CM

## 2017-02-08 NOTE — Telephone Encounter (Signed)
Follow Up ° ° ° °Returning call from earlier. Please call. °

## 2017-02-08 NOTE — Telephone Encounter (Signed)
Left message to call back  

## 2017-02-14 ENCOUNTER — Telehealth: Payer: Self-pay | Admitting: Cardiovascular Disease

## 2017-02-14 NOTE — Telephone Encounter (Signed)
See phone note dated 02/08/17

## 2017-02-14 NOTE — Telephone Encounter (Signed)
New message   Pt returning call to [facility]

## 2017-02-14 NOTE — Telephone Encounter (Signed)
I spoke with pt and reviewed recent lab results with him.  He will come in for fasting lipid profile on 05/15/17

## 2017-02-21 DIAGNOSIS — Z955 Presence of coronary angioplasty implant and graft: Secondary | ICD-10-CM | POA: Diagnosis not present

## 2017-02-21 DIAGNOSIS — I2510 Atherosclerotic heart disease of native coronary artery without angina pectoris: Secondary | ICD-10-CM | POA: Diagnosis not present

## 2017-02-21 DIAGNOSIS — R251 Tremor, unspecified: Secondary | ICD-10-CM | POA: Diagnosis not present

## 2017-02-21 DIAGNOSIS — I10 Essential (primary) hypertension: Secondary | ICD-10-CM | POA: Diagnosis not present

## 2017-02-21 DIAGNOSIS — R441 Visual hallucinations: Secondary | ICD-10-CM | POA: Diagnosis not present

## 2017-02-21 DIAGNOSIS — Z79899 Other long term (current) drug therapy: Secondary | ICD-10-CM | POA: Diagnosis not present

## 2017-02-21 DIAGNOSIS — Y900 Blood alcohol level of less than 20 mg/100 ml: Secondary | ICD-10-CM | POA: Diagnosis not present

## 2017-02-21 DIAGNOSIS — Z7901 Long term (current) use of anticoagulants: Secondary | ICD-10-CM | POA: Diagnosis not present

## 2017-02-21 DIAGNOSIS — Z8673 Personal history of transient ischemic attack (TIA), and cerebral infarction without residual deficits: Secondary | ICD-10-CM | POA: Diagnosis not present

## 2017-02-21 DIAGNOSIS — R69 Illness, unspecified: Secondary | ICD-10-CM | POA: Diagnosis not present

## 2017-02-21 DIAGNOSIS — I4891 Unspecified atrial fibrillation: Secondary | ICD-10-CM | POA: Diagnosis not present

## 2017-03-03 ENCOUNTER — Other Ambulatory Visit: Payer: Self-pay | Admitting: Cardiovascular Disease

## 2017-03-04 ENCOUNTER — Encounter (HOSPITAL_BASED_OUTPATIENT_CLINIC_OR_DEPARTMENT_OTHER): Payer: Self-pay | Admitting: *Deleted

## 2017-03-04 ENCOUNTER — Emergency Department (HOSPITAL_BASED_OUTPATIENT_CLINIC_OR_DEPARTMENT_OTHER): Payer: Medicare HMO

## 2017-03-04 ENCOUNTER — Emergency Department (HOSPITAL_BASED_OUTPATIENT_CLINIC_OR_DEPARTMENT_OTHER)
Admission: EM | Admit: 2017-03-04 | Discharge: 2017-03-04 | Disposition: A | Discharge status: Home / Self Care | Payer: Medicare HMO | Attending: Emergency Medicine | Admitting: Emergency Medicine

## 2017-03-04 DIAGNOSIS — I10 Essential (primary) hypertension: Secondary | ICD-10-CM | POA: Diagnosis not present

## 2017-03-04 DIAGNOSIS — Z79899 Other long term (current) drug therapy: Secondary | ICD-10-CM | POA: Diagnosis not present

## 2017-03-04 DIAGNOSIS — R079 Chest pain, unspecified: Secondary | ICD-10-CM | POA: Diagnosis not present

## 2017-03-04 DIAGNOSIS — Z959 Presence of cardiac and vascular implant and graft, unspecified: Secondary | ICD-10-CM | POA: Insufficient documentation

## 2017-03-04 DIAGNOSIS — Z87891 Personal history of nicotine dependence: Secondary | ICD-10-CM | POA: Insufficient documentation

## 2017-03-04 DIAGNOSIS — Z7901 Long term (current) use of anticoagulants: Secondary | ICD-10-CM | POA: Insufficient documentation

## 2017-03-04 DIAGNOSIS — R51 Headache: Secondary | ICD-10-CM | POA: Diagnosis not present

## 2017-03-04 DIAGNOSIS — R200 Anesthesia of skin: Secondary | ICD-10-CM

## 2017-03-04 DIAGNOSIS — E860 Dehydration: Secondary | ICD-10-CM | POA: Diagnosis not present

## 2017-03-04 DIAGNOSIS — I4891 Unspecified atrial fibrillation: Secondary | ICD-10-CM | POA: Insufficient documentation

## 2017-03-04 DIAGNOSIS — I259 Chronic ischemic heart disease, unspecified: Secondary | ICD-10-CM | POA: Diagnosis not present

## 2017-03-04 DIAGNOSIS — R0602 Shortness of breath: Secondary | ICD-10-CM | POA: Diagnosis not present

## 2017-03-04 LAB — BASIC METABOLIC PANEL
Anion gap: 7 (ref 5–15)
BUN: 15 mg/dL (ref 6–20)
CHLORIDE: 106 mmol/L (ref 101–111)
CO2: 25 mmol/L (ref 22–32)
CREATININE: 1.12 mg/dL (ref 0.61–1.24)
Calcium: 9.2 mg/dL (ref 8.9–10.3)
GFR calc Af Amer: >60 mL/min (ref >60)
GLUCOSE: 95 mg/dL (ref 65–99)
Potassium: 4.3 mmol/L (ref 3.5–5.1)
SODIUM: 138 mmol/L (ref 135–145)

## 2017-03-04 LAB — TROPONIN I

## 2017-03-04 LAB — CBC
HCT: 44.4 % (ref 39.0–52.0)
Hemoglobin: 14.9 g/dL (ref 13.0–17.0)
MCH: 31.2 pg (ref 26.0–34.0)
MCHC: 33.6 g/dL (ref 30.0–36.0)
MCV: 92.9 fL (ref 78.0–100.0)
PLATELETS: 180 10*3/uL (ref 150–400)
RBC: 4.78 MIL/uL (ref 4.22–5.81)
RDW: 14.4 % (ref 11.5–15.5)
WBC: 6.1 10*3/uL (ref 4.0–10.5)

## 2017-03-04 LAB — URINALYSIS, ROUTINE W REFLEX MICROSCOPIC
BILIRUBIN URINE: NEGATIVE
Glucose, UA: NEGATIVE mg/dL
Hgb urine dipstick: NEGATIVE
KETONES UR: NEGATIVE mg/dL
LEUKOCYTES UA: NEGATIVE
Nitrite: NEGATIVE
PROTEIN: NEGATIVE mg/dL
Specific Gravity, Urine: <1.005 — ABNORMAL LOW (ref 1.005–1.030)
pH: 7 (ref 5.0–8.0)

## 2017-03-04 LAB — I-STAT TROPONIN, ED: Troponin i, poc: 0.01 ng/mL (ref 0.00–0.08)

## 2017-03-04 NOTE — ED Notes (Signed)
Patient reports waking up with numbness in right hand which is still present but states that sensation remains the same on bilateral hands.  Reports he went to work and began feeling lightheaded-took bp 176/124.  Denies chest pain.  Reports intermittent shortness of breath.  States normal HR in the 60s

## 2017-03-04 NOTE — ED Notes (Signed)
ED Provider at bedside. 

## 2017-03-04 NOTE — ED Triage Notes (Signed)
He feels light headed. Numbness in his right hand since getting up at 6:30am. He is ambulatory. Alert oriented.

## 2017-03-06 NOTE — ED Provider Notes (Signed)
[facility] DEPT Provider Note   CSN: [id number] Arrival date & time: 03/04/17  1241     History   Chief Complaint Chief Complaint  Patient presents with  . Numbness    HPI Luis [name] is a 82 y.o. male.  HPI Patient's an 82 year old male who remains active.  He has a history of chronic atrial fibrillation on chronic anticoagulation and coronary artery disease.  He  presents the emergency department because some mild right-handed numbness.  No chest pain shortness breath.  He began feeling lightheaded and found his blood pressure be elevated 176/124 thus he came to the ER for further evaluation.  Denies abdominal pain.  No low back pain.  Symptoms are mild in severity.  Denies weakness of his arms or legs but does report that he had some difficulty buttoning his shirt with his right hand.  History of TIAs before in the past.  He is compliant with his medications including his Eliquis    Past Medical History:  Diagnosis Date  . Angina   . Atrial fibrillation ([place])   . COLONIC POLYPS, HX OF    Dr Earlean Shawl  . CORONARY ARTERY DISEASE    Plavix as of 07/30/11; Dr Angelena Form  . DJD (degenerative joint disease)   . HYPERLIPIDEMIA   . Myocardial infarction ([place]) 08/1999  . Shortness of breath 03/02/2015  . Unspecified essential hypertension     Patient Active Problem List   Diagnosis Date Noted  . Shortness of breath 03/02/2015  . TIA (transient ischemic attack) 11/29/2014  . Slurred speech 11/29/2014  . Atrial fibrillation, new onset ([place]) 11/29/2014  . HTN (hypertension) 11/29/2014  . HLD (hyperlipidemia) 10/27/2007  . Coronary atherosclerosis 10/27/2007  . COLONIC POLYPS, HX OF 10/27/2007    Past Surgical History:  Procedure Laterality Date  . CARDIAC CATHETERIZATION  ~ 2002  . CARDIAC CATHETERIZATION N/A 03/02/2015   Procedure: Left Heart Cath and Coronary Angiography;  Surgeon: Sherren Mocha, MD;  Location: [facility];  Service: Cardiovascular;  Laterality:  N/A;  . CATARACT EXTRACTION W/ INTRAOCULAR LENS  IMPLANT, BILATERAL  ~ 2007   bilaterally  . CORONARY ANGIOPLASTY  07/30/11  . CORONARY ANGIOPLASTY WITH STENT PLACEMENT  2001; 07/30/11;   "2+3"  . INGUINAL HERNIA REPAIR  2005   left  . LEFT HEART CATHETERIZATION WITH CORONARY ANGIOGRAM N/A 07/30/2011   Procedure: LEFT HEART CATHETERIZATION WITH CORONARY ANGIOGRAM;  Surgeon: Burnell Blanks, MD;  Location: [facility] CATH LAB;  Service: Cardiovascular;  Laterality: N/A;  . PERCUTANEOUS CORONARY STENT INTERVENTION (PCI-S)  07/30/2011   Procedure: PERCUTANEOUS CORONARY STENT INTERVENTION (PCI-S);  Surgeon: Burnell Blanks, MD;  Location: [facility] CATH LAB;  Service: Cardiovascular;;  . RETINAL DETACHMENT SURGERY  05/2008; 06/2008; 07/2008   left; "all 3 surgeries have failed"       Home Medications    Prior to Admission medications   Medication Sig Start Date End Date Taking? Authorizing Provider  apixaban (ELIQUIS) 5 MG TABS tablet Take 1 tablet (5 mg total) by mouth 2 (two) times daily. 04/17/16   Burnell Blanks, MD  atenolol (TENORMIN) 50 MG tablet TAKE 1 TABLET ONCE DAILY. 03/05/17   Burnell Blanks, MD  atorvastatin (LIPITOR) 80 MG tablet Take 1 tablet (80 mg total) by mouth daily. 04/30/16   Burnell Blanks, MD  ezetimibe (ZETIA) 10 MG tablet Take 1 tablet (10 mg total) by mouth daily. 04/30/16   Burnell Blanks, MD  folic acid (FOLVITE) 1 MG tablet Take  1 mg by mouth daily.    [provider]  Glucosamine HCl 1000 MG TABS Take 1 tablet by mouth daily.     [provider]  MELATONIN PO Take 1 tablet by mouth as needed.     [provider]  Misc Natural Products (BLACK CHERRY CONCENTRATE PO) Take 400 mg by mouth daily. 2 BY MOUTH ONCE DAILY    [provider]  NITROSTAT 0.4 MG SL tablet DISSOLVE 1 TABLET UNDER TONGUE AS NEEDED FOR CHEST PAIN,MAY REPEAT IN5 MINUTES FOR 2 DOSES. 06/05/16   Burnell Blanks, MD    Potassium Aminobenzoate (POTABA) 500 MG TABS Take 1 tablet by mouth daily.    [provider]  Saw Palmetto, Serenoa repens, (SAW PALMETTO PO) Take 400 mg by mouth daily.    [provider]  vitamin B-12 (CYANOCOBALAMIN) 1000 MCG tablet Take 1,000 mcg by mouth daily.    [provider]  vitamin E 1000 UNIT capsule Take 2,000 Units by mouth daily. 08/31/09   [provider]    Family History Family History  Problem Relation Age of Onset  . Colon cancer Father        in 58s  . Prostate cancer Father   . Alcohol abuse Mother     Social History Social History  Substance Use Topics  . Smoking status: Former Smoker    Packs/day: 0.50    Years: 40.00    Types: Cigarettes    Quit date: 06/12/1999  . Smokeless tobacco: Never Used  . 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   Review of Systems Review of Systems  All other systems reviewed and are negative.    Physical Exam Updated Vital Signs BP (!) 151/88   Pulse (!) 55   Temp 97.8 F (36.6 C)   Resp 20   Ht 6' (1.829 m)   Wt 93 kg (205 lb)   SpO2 96%   BMI 27.80 kg/m   Physical Exam  Constitutional: He is oriented to person, place, and time. He appears well-developed and well-nourished.  HENT:  Head: Normocephalic and atraumatic.  Eyes: Pupils are equal, round, and reactive to light. EOM are normal.  Neck: Normal range of motion.  Cardiovascular: Normal rate, regular rhythm and intact distal pulses.   Pulmonary/Chest: Effort normal and breath sounds normal. No respiratory distress.  Abdominal: Soft. He exhibits no distension. There is no tenderness.  Musculoskeletal: Normal range of motion.  Neurological: He is alert and oriented to person, place, and time.  5/5 strength in major muscle groups of  bilateral upper and lower extremities. Speech normal. No facial asymetry.   Skin: Skin is warm and  dry.  Nursing note and vitals reviewed.    ED Treatments / Results  Labs (all labs ordered are listed, but only abnormal results are displayed) Labs Reviewed  URINALYSIS, ROUTINE W REFLEX MICROSCOPIC - Abnormal; Notable for the following:       Result Value   Specific Gravity, Urine <1.005 (*)    All other components within normal limits  BASIC METABOLIC PANEL  CBC  TROPONIN I  I-STAT TROPONIN, ED  I-STAT TROPONIN, ED    EKG  EKG Interpretation  Date/Time:  Monday March 04 2017 12:58:11 EDT Ventricular Rate:  52 PR Interval:    QRS Duration: 133 QT Interval:  463 QTC Calculation: 431 R Axis:   -13 Text  Interpretation:  Atrial fibrillation Right bundle branch block No significant change was found Confirmed by Jola Schmidt [phone]) on 03/04/2017 3:04:52 PM       Radiology Dg Chest 2 View  Result Date: 03/04/2017 CLINICAL DATA:  Patient reports waking up with numbness in right hand which is still present but states that sensation remains the same on bilateral hands. Reports he went to work and began feeling lightheaded-took bp 176/124. Denies chest pain. Intermittent shortness of breath, fatigue. History of atrial fibrillation. Former smoker. EXAM: CHEST  2 VIEW COMPARISON:  Chest x-ray dated 11/29/2014. FINDINGS: Heart size is upper normal, stable. Overall cardiomediastinal silhouette is stable. Atherosclerotic changes noted at the aortic arch. Lungs are clear. No pleural effusion or pneumothorax seen. Mild degenerative change within the thoracic spine. No acute or suspicious osseous finding. IMPRESSION: 1. No active cardiopulmonary disease. No evidence of pneumonia or pulmonary edema. 2. Aortic atherosclerosis. Electronically Signed   By: Franki Cabot M.D.   On: 03/04/2017 14:13   Ct Head Wo Contrast  Result Date: 03/04/2017 CLINICAL DATA:  Numbness or tingling, paresthesias. Numbness in the right index finger and thumb at work today associated with hypertension. EXAM: CT  HEAD WITHOUT CONTRAST TECHNIQUE: Contiguous axial images were obtained from the base of the skull through the vertex without intravenous contrast. COMPARISON:  CTA of the head and neck 11/29/2014 FINDINGS: Brain: A remote lacunar infarct involving the posterior limb of the left internal capsule or lateral thalamus is stable. A remote lacunar infarct anteriorly in the right thalamus is stable. Atrophy and white matter disease is unchanged. No acute cortical lesion is present. The left pre and postcentral gyrus is intact. The insular ribbon is normal bilaterally. Brainstem and cerebellum are within normal limits. Vascular: Atherosclerotic calcifications are present within the cavernous internal carotid arteries and at the dural margin of the vertebral arteries bilaterally. Skull: Calvarium is intact. No focal lytic or blastic lesions are present. Sinuses/Orbits: The paranasal sinuses and mastoid air cells are clear. The left globe is hyperdense compatible with remote injection, unchanged. Right lens replacement is noted. The globes and orbits are otherwise within normal limits. IMPRESSION: 1. No acute intracranial abnormality or significant interval change. 2. Stable atrophy and white matter disease. 3. Atherosclerosis without a focal hyperdense vessel. Electronically Signed   By: San Morelle M.D.   On: 03/04/2017 14:25    Procedures Procedures (including critical care time)  Medications Ordered in ED Medications - No data to display   Initial Impression / Assessment and Plan / ED Course  I have reviewed the triage vital signs and the nursing notes.  Pertinent labs & imaging results that were available during my care of the patient were reviewed by me and considered in my medical decision making (see chart for details).     Overall the patient is well-appearing.  He is ambulatory in the emergency department.  His workup in the ER is without significant abnormality.  This could represent very  subtle left brain stroke with some numbness in his right hand.  He is on maximal therapy at this time.  He has a good primary care physician.  Close outpatient follow-up with primary care as well as neurology.  He'll likely need repeat carotid duplex as well as echocardiogram.  He will continue same medications.  I do not think he needs to be acutely admitted the hospital this time.  His issues with his right hand and right hand numbness could also be a peripheral nerve issue.  This will need to be further evaluated by the neurologist.  Final Clinical Impressions(s) / ED Diagnoses   Final diagnoses:  Numbness  Dehydration    New Prescriptions Discharge Medication List as of 03/04/2017  4:04 PM       Jola Schmidt, MD 03/06/17 0128

## 2017-04-08 DIAGNOSIS — R69 Illness, unspecified: Secondary | ICD-10-CM | POA: Diagnosis not present

## 2017-04-20 ENCOUNTER — Other Ambulatory Visit: Payer: Self-pay | Admitting: Cardiovascular Disease

## 2017-04-22 NOTE — Telephone Encounter (Signed)
Eliquis 5mg  refill received; pt is 82 yrs old, wt-93.5kg, Crea-1.12 on 03/04/17, last seen by Dr. Angelena Form on 09/27/16. Refill sent to requested Pharmacy.

## 2017-05-01 ENCOUNTER — Encounter: Payer: Self-pay | Admitting: Cardiovascular Disease

## 2017-05-08 ENCOUNTER — Other Ambulatory Visit: Payer: Self-pay | Admitting: Cardiovascular Disease

## 2017-05-15 ENCOUNTER — Other Ambulatory Visit: Payer: Medicare HMO | Admitting: *Deleted

## 2017-05-15 DIAGNOSIS — E7849 Other hyperlipidemia: Secondary | ICD-10-CM

## 2017-05-15 LAB — LIPID PANEL
CHOL/HDL RATIO: 2.4 ratio (ref 0.0–5.0)
Cholesterol, Total: 126 mg/dL (ref 100–199)
HDL: 52 mg/dL (ref >39)
LDL Calculated: 61 mg/dL (ref 0–99)
TRIGLYCERIDES: 66 mg/dL (ref 0–149)
VLDL Cholesterol Cal: 13 mg/dL (ref 5–40)

## 2017-05-30 ENCOUNTER — Other Ambulatory Visit: Payer: Self-pay | Admitting: Cardiovascular Disease

## 2017-07-10 ENCOUNTER — Other Ambulatory Visit: Payer: Self-pay | Admitting: Cardiovascular Disease

## 2017-07-23 ENCOUNTER — Encounter: Payer: Self-pay | Admitting: Cardiovascular Disease

## 2017-09-08 ENCOUNTER — Other Ambulatory Visit: Payer: Self-pay | Admitting: Cardiovascular Disease

## 2017-09-28 ENCOUNTER — Other Ambulatory Visit: Payer: Self-pay | Admitting: Cardiovascular Disease

## 2017-10-02 DIAGNOSIS — H524 Presbyopia: Secondary | ICD-10-CM | POA: Diagnosis not present

## 2017-10-02 DIAGNOSIS — H52201 Unspecified astigmatism, right eye: Secondary | ICD-10-CM | POA: Diagnosis not present

## 2017-10-13 ENCOUNTER — Other Ambulatory Visit: Payer: Self-pay | Admitting: Cardiovascular Disease

## 2017-10-14 ENCOUNTER — Encounter: Payer: Self-pay | Admitting: Family Medicine

## 2017-10-14 ENCOUNTER — Ambulatory Visit (INDEPENDENT_AMBULATORY_CARE_PROVIDER_SITE_OTHER): Payer: Medicare HMO | Admitting: Family Medicine

## 2017-10-14 VITALS — BP 108/70 | HR 54 | Temp 97.6°F | Resp 14 | Ht 72.0 in | Wt 200.0 lb

## 2017-10-14 DIAGNOSIS — E782 Mixed hyperlipidemia: Secondary | ICD-10-CM | POA: Diagnosis not present

## 2017-10-14 DIAGNOSIS — R2681 Unsteadiness on feet: Secondary | ICD-10-CM

## 2017-10-14 DIAGNOSIS — I4891 Unspecified atrial fibrillation: Secondary | ICD-10-CM

## 2017-10-14 DIAGNOSIS — Z1211 Encounter for screening for malignant neoplasm of colon: Secondary | ICD-10-CM

## 2017-10-14 NOTE — Patient Instructions (Signed)
Was very nice to meet you today!  We will not make any medication changes today.  Please come back to see me in September for your annual physical.  Please come in 2 days before your physical for your blood work.  I will place an order for Cologuard today.  We may need to get you into see a GI specialist eventually.  Take care, Dr Jerline Pain

## 2017-10-14 NOTE — Assessment & Plan Note (Signed)
Irregular today, however rate controlled.  Continue atenolol and Eliquis.

## 2017-10-14 NOTE — Assessment & Plan Note (Signed)
Unclear underlying etiology.  Likely multifactorial in setting of decreased visual acuity and peripheral neuropathy.  His neurological exam is otherwise normal today.  Offered referral to physical therapy for further treatment, however patient declined.  Stated he would be trying to exercise more to see if this would help.  Discussed reasons to return to care and seek care emergently.

## 2017-10-14 NOTE — Progress Notes (Signed)
Subjective:  Luis [name] is a 83 y.o. male who presents today with a chief complaint of abnormal gait and to establish care.  HPI:  Abnormal Gait, new problem Symptoms started a few months ago.  Notices that after he stands up he is a little unsteady after his first few steps.  Denies any weakness or numbness.  Patient has decreased visual acuity in his left eye due to history of retinal detachment.  This is a chronic finding and has not changed recently.  Denies any overt weakness or numbness.  No falls or syncopal episodes.  No numbness or tingling.  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  Atrial fibrillation, chronic problem, new to provider Currently on Eliquis and atenolol.  Symptoms are stable.  Hyperlipidemia/coronary artery disease, chronic problems, new to provider On atorvastatin 80 mg daily.  Tolerates this well without side effects.  Also on Zetia 10 mg daily.  ROS: Per HPI, otherwise a complete review of systems was negative.   PMH:  The following were reviewed and entered/updated in epic: Past Medical History:  Diagnosis Date  . Angina   . Atrial fibrillation ([place])   . COLONIC POLYPS, HX OF    Dr Earlean Shawl  . CORONARY ARTERY DISEASE    Plavix as of 07/30/11; Dr Angelena Form  . DJD (degenerative joint disease)   . HYPERLIPIDEMIA   . Myocardial infarction ([place]) 08/1999  . Shortness of breath 03/02/2015  . Unspecified essential hypertension    Patient Active Problem List   Diagnosis Date Noted  . Unsteady gait 10/14/2017  . TIA (transient ischemic attack) 11/29/2014  . Atrial fibrillation ([place]) 11/29/2014  . HTN (hypertension) 11/29/2014  . HLD (hyperlipidemia) 10/27/2007  . Coronary atherosclerosis 10/27/2007  . COLONIC POLYPS, HX OF 10/27/2007   Past Surgical History:  Procedure Laterality Date  . CARDIAC CATHETERIZATION  ~ 2002  . CARDIAC CATHETERIZATION N/A 03/02/2015   Procedure: Left Heart Cath and Coronary Angiography;   Surgeon: Sherren Mocha, MD;  Location: [facility];  Service: Cardiovascular;  Laterality: N/A;  . CATARACT EXTRACTION W/ INTRAOCULAR LENS  IMPLANT, BILATERAL  ~ 2007   bilaterally  . CORONARY ANGIOPLASTY  07/30/11  . CORONARY ANGIOPLASTY WITH STENT PLACEMENT  2001; 07/30/11;   "2+3"  . INGUINAL HERNIA REPAIR  2005   left  . LEFT HEART CATHETERIZATION WITH CORONARY ANGIOGRAM N/A 07/30/2011   Procedure: LEFT HEART CATHETERIZATION WITH CORONARY ANGIOGRAM;  Surgeon: Burnell Blanks, MD;  Location: [facility] C[facility] LAB;  Service: Cardiovascular;  Laterality: N/A;  . PERCUTANEOUS CORONARY STENT INTERVENTION (PCI-S)  07/30/2011   Procedure: PERCUTANEOUS CORONARY STENT INTERVENTION (PCI-S);  Surgeon: Burnell Blanks, MD;  Location: [facility] CATH LAB;  Service: Cardiovascular;;  . RETINAL DETACHMENT SURGERY  05/2008; 06/2008; 07/2008   left; "all 3 surgeries have failed"    Family History  Problem Relation Age of Onset  . Colon cancer Father        in 54s  . Prostate cancer Father   . Alcohol abuse Mother     Medications- reviewed and updated Current Outpatient Medications  Medication Sig Dispense Refill  . atenolol (TENORMIN) 50 MG tablet TAKE 1 TABLET ONCE DAILY. 15 tablet 0  . atorvastatin (LIPITOR) 80 MG tablet TAKE 1 TABLET ONCE DAILY. 15 tablet 0  . ELIQUIS 5 MG TABS tablet TAKE 1 TABLET BY MOUTH TWICE DAILY. 60 tablet 5  . ezetimibe (ZETIA) 10 MG tablet TAKE (1) TABLET DAILY AT BEDTIME. 15  tablet 0  . folic acid (FOLVITE) 1 MG tablet Take by mouth.    . Glucosamine HCl 1000 MG TABS Take 1 tablet by mouth daily.     Marland Kitchen MELATONIN PO Take 1 tablet by mouth as needed.     . Misc Natural Products (BLACK CHERRY CONCENTRATE PO) Take by mouth.    Marland Kitchen NITROSTAT 0.4 MG SL tablet DISSOLVE 1 TABLET UNDER TONGUE AS NEEDED FOR CHEST PAIN,MAY REPEAT IN5 MINUTES FOR 2 DOSES. 25 tablet 3  . Potassium Aminobenzoate (POTABA) 500 MG TABS Take 1 tablet by mouth daily.    . Saw Palmetto 500 MG CAPS Take  by mouth.    . vitamin B-12 (CYANOCOBALAMIN) 1000 MCG tablet Take 1,000 mcg by mouth daily.    . vitamin E 1000 UNIT capsule Take 2,000 Units by mouth daily.     No current facility-administered medications for this visit.     Allergies-reviewed and updated No Known Allergies  Social History   Socioeconomic History  . Marital status: Married    Spouse name: Not on file  . Number of children: Not on file  . Years of education: Not on file  . Highest education level: Not on file  Occupational History  . Not on file  Social Needs  . Financial resource strain: Not on file  . Food insecurity:    Worry: Not on file    Inability: Not on file  . Transportation needs:    Medical: Not on file    Non-medical: Not on file  Tobacco Use  . Smoking status: Former Smoker    Packs/day: 0.50    Years: 40.00    Pack years: 20.00    Types: Cigarettes    Last attempt to quit: 06/12/1999    Years since quitting: 18.3  . Smokeless tobacco: Never Used  Substance and Sexual Activity  . Alcohol use: No    Comment: 07/30/11 "recovering alcoholic; since 0/07/4095"  . Drug use: No  . Sexual activity: Not Currently  Lifestyle  . Physical activity:    Days per week: Not on file    Minutes per session: Not on file  . Stress: Not on file  Relationships  . Social connections:    Talks on phone: Not on file    Gets together: Not on file    Attends religious service: Not on file    Active member of club or organization: Not on file    Attends meetings of clubs or organizations: Not on file    Relationship status: Not on file  Other Topics Concern  . Not on file  Social History Narrative  . Not on file     Objective:  Physical Exam: BP 108/70   Pulse (!) 54   Temp 97.6 F (36.4 C)   Resp 14   Ht 6' (1.829 m)   Wt 200 lb (90.7 kg)   SpO2 96%   BMI 27.12 kg/m   Gen: NAD, resting comfortably CV: Irregular with no murmurs appreciated Pulm: NWOB, CTAB with no crackles, wheezes, or  rhonchi GI: Normal bowel sounds present. Soft, Nontender, Nondistended. MSK: No edema, cyanosis, or clubbing noted Skin: Warm, dry Neuro: Cranial nerves II through XII intact.  Strength 5 out of 5 in upper and lower extremities.  Grossly decreased visual acuity in his left eye (this is a chronic finding).  Sensation light touch intact throughout.  Very faint unsteady gait with first couple of steps. Psych: Normal affect and thought content  Assessment/Plan:  Unsteady gait Unclear underlying etiology.  Likely multifactorial in setting of decreased visual acuity and peripheral neuropathy.  His neurological exam is otherwise normal today.  Offered referral to physical therapy for further treatment, however patient declined.  Stated he would be trying to exercise more to see if this would help.  Discussed reasons to return to care and seek care emergently.  Atrial fibrillation (HCC) Irregular today, however rate controlled.  Continue atenolol and Eliquis.  HLD (hyperlipidemia) Continue atorvastatin 80 mg daily and Zetia 10 mg daily.  Patient will follow-up in a few months for lipid panel.  Preventative healthcare Request colon cancer screening.  Cologuard was ordered.  Also placed future lab orders.  He will come back in a few months for his annual physical and blood work.  Algis Greenhouse. Jerline Pain, MD 10/14/2017 3:13 PM

## 2017-10-14 NOTE — Assessment & Plan Note (Signed)
Continue atorvastatin 80 mg daily and Zetia 10 mg daily.  Patient will follow-up in a few months for lipid panel.

## 2017-10-21 ENCOUNTER — Other Ambulatory Visit: Payer: Self-pay | Admitting: Cardiovascular Disease

## 2017-10-22 DIAGNOSIS — Z1211 Encounter for screening for malignant neoplasm of colon: Secondary | ICD-10-CM | POA: Diagnosis not present

## 2017-10-24 ENCOUNTER — Other Ambulatory Visit: Payer: Self-pay | Admitting: Cardiovascular Disease

## 2017-11-05 ENCOUNTER — Encounter: Payer: Self-pay | Admitting: Physician Assistant

## 2017-11-05 LAB — COLOGUARD: Cologuard: NEGATIVE

## 2017-11-05 NOTE — Progress Notes (Signed)
Cardiology Office Note    Date:  11/06/2017  ID:  Luis [name], DOB 1935-03-31, MRN [id number] PCP:  Vivi Barrack, MD  Cardiologist:  Lauree Chandler, MD   Chief Complaint: 1 year follow-up of CAD   History of Present Illness:  Luis [name] is a 83 y.o. male with history of CAD (BMS-RCA, BMS-Cx, PTCA of OM1 in 2001, overlapping DES in RCA and PTCA to small distal Cx 2013), TIA, HTN, HLD, RBBB and persistent atrial fibrillation here today for routine cardiac follow-up. His cardiac history dates back to 2001 when he had a bare-metal stent placed in the right coronary artery and a bare metal stent placed in the Circumflex artery as well as balloon angioplasty of the ostium of the first obtuse marginal coronary artery at that time. Cardiac cath February 2013 with placement of two non-overlapping drug eluting stents in the RCA and severe disease in the small distal Circumflex treated with balloon angioplasty only.  He was admitted to [facility] June 2016 with TIA type symptoms and was found to be in atrial fibrillation. He was started on Eliquis and atenolol was continued. Carotid artery dopplers 2016 showed mild bilateral disease (1-39%). 48 Hour monitor June 2016 with persistent atrial fibrillation, rare PVCs. Echo June 2016 showed normal LV size and function, no valve issues. He was seen in the office 02/2015 with worsening dyspnea and cath showed stable CAD with continued patency of the stented segments in the RCA, mild stenosis of the LCx stent, mild diffuse nonobstructive stenosis of the LAD, and severe distal stenosis of small OM subranches, normal LV function. It was suspected dyspnea was unrelated to coronary disease. Last labs 05/2017 showed LDL 61,  02/2017 normal CBC, BMET with Cr 1.12, 01/2017 LFTs wnl. Give lack of symptoms, his AF has been managed with rate control.  He returns for follow-up overall feeling well. He has noticed some unsteadiness of his gait and dyspnea which he states  is chronic back to 2016. No falls, syncope, dizziness, chest pain, or palpitations. Denies any bleeding. Overall he's satisfied with how he feels.    Past Medical History:  Diagnosis Date  . CAD (coronary artery disease)    a. BMS-RCA, BMS-Cx, PTCA of OM1 in 2001. b. overlapping DES in RCA and PTCA to small distal Cx 2013. c. Cath 2016 -> stable CAD, suspect dyspnea unrelated to coronary disease.  Marland Kitchen COLONIC POLYPS, HX OF    Dr Earlean Shawl  . DJD (degenerative joint disease)   . Essential hypertension   . Hyperlipidemia   . Myocardial infarction ([place]) 08/1999  . Persistent atrial fibrillation ([place])    a. dx 2016.  Marland Kitchen RBBB   . TIA (transient ischemic attack)     Past Surgical History:  Procedure Laterality Date  . CARDIAC CATHETERIZATION  ~ 2002  . CARDIAC CATHETERIZATION N/A 03/02/2015   Procedure: Left Heart Cath and Coronary Angiography;  Surgeon: Sherren Mocha, MD;  Location: [facility];  Service: Cardiovascular;  Laterality: N/A;  . CATARACT EXTRACTION W/ INTRAOCULAR LENS  IMPLANT, BILATERAL  ~ 2007   bilaterally  . CORONARY ANGIOPLASTY  07/30/11  . CORONARY ANGIOPLASTY WITH STENT PLACEMENT  2001; 07/30/11;   "2+3"  . INGUINAL HERNIA REPAIR  2005   left  . LEFT HEART CATHETERIZATION WITH CORONARY ANGIOGRAM N/A 07/30/2011   Procedure: LEFT HEART CATHETERIZATION WITH CORONARY ANGIOGRAM;  Surgeon: Burnell Blanks, MD;  Location: [facility] C[facility] LAB;  Service: Cardiovascular;  Laterality: N/A;  . PERCUTANEOUS CORONARY  STENT INTERVENTION (PCI-S)  07/30/2011   Procedure: PERCUTANEOUS CORONARY STENT INTERVENTION (PCI-S);  Surgeon: Burnell Blanks, MD;  Location: [facility] CATH LAB;  Service: Cardiovascular;;  . RETINAL DETACHMENT SURGERY  05/2008; 06/2008; 07/2008   left; "all 3 surgeries have failed"    Current Medications: Current Meds  Medication Sig  . atenolol (TENORMIN) 50 MG tablet TAKE 1 TABLET ONCE DAILY.  Marland Kitchen atorvastatin (LIPITOR) 80 MG tablet TAKE 1 TABLET ONCE DAILY.  Marland Kitchen  ELIQUIS 5 MG TABS tablet TAKE 1 TABLET BY MOUTH TWICE DAILY.  Marland Kitchen ezetimibe (ZETIA) 10 MG tablet TAKE (1) TABLET DAILY AT BEDTIME.  . folic acid (FOLVITE) 1 MG tablet Take by mouth.  . Glucosamine HCl 1000 MG TABS Take 1 tablet by mouth daily.   Marland Kitchen MELATONIN PO Take 1 tablet by mouth as needed.   . Misc Natural Products (BLACK CHERRY CONCENTRATE PO) Take by mouth.  Marland Kitchen NITROSTAT 0.4 MG SL tablet DISSOLVE 1 TABLET UNDER TONGUE AS NEEDED FOR CHEST PAIN,MAY REPEAT IN5 MINUTES FOR 2 DOSES.  Marland Kitchen Potassium Aminobenzoate (POTABA) 500 MG TABS Take 1 tablet by mouth daily.  . Saw Palmetto 500 MG CAPS Take by mouth.  . vitamin B-12 (CYANOCOBALAMIN) 1000 MCG tablet Take 1,000 mcg by mouth daily.  . vitamin E 1000 UNIT capsule Take 2,000 Units by mouth daily.    Allergies:   Patient has no known allergies.   Social History   Socioeconomic History  . Marital status: Married    Spouse name: Not on file  . Number of children: Not on file  . Years of education: Not on file  . Highest education level: Not on file  Occupational History  . Not on file  Social Needs  . Financial resource strain: Not on file  . Food insecurity:    Worry: Not on file    Inability: Not on file  . Transportation needs:    Medical: Not on file    Non-medical: Not on file  Tobacco Use  . Smoking status: Former Smoker    Packs/day: 0.50    Years: 40.00    Pack years: 20.00    Types: Cigarettes    Last attempt to quit: 06/12/1999    Years since quitting: 18.4  . Smokeless tobacco: Never Used  Substance and Sexual Activity  . 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 Drug use: No  . Sexual activity: Not Currently  Lifestyle  . Physical activity:    Days per week: Not on file    Minutes per session: Not on file  . Stress: Not on file  Relationships  . Social connections:    Talks on phone: Not on file    Gets together: Not on file    Attends religious service: Not on file     Active member of club or organization: Not on file    Attends meetings of clubs or organizations: Not on file    Relationship status: Not on file  Other Topics Concern  . Not on file  Social History Narrative  . Not on file     Family History:  The patient's family history includes Alcohol abuse in his mother; Colon cancer in his father; Prostate cancer in his father.  ROS:   Please see the history of present illness.  All other systems are reviewed and otherwise negative.    PHYSICAL EXAM:   VS:  BP 102/64   Pulse (!) 51   Ht  6' (1.829 m)   Wt 201 lb (91.2 kg)   SpO2 97%   BMI 27.26 kg/m   BMI: Body mass index is 27.26 kg/m. GEN: Well nourished, well developed WM, in no acute distress HEENT: normocephalic, atraumatic Neck: no JVD, carotid bruits, or masses Cardiac: irregularly irregular, mildly bradycardic; no murmurs, rubs, or gallops, no edema  Respiratory:  clear to auscultation bilaterally, normal work of breathing GI: soft, nontender, nondistended, + BS MS: no deformity or atrophy Skin: warm and dry, no rash Neuro:  Alert and Oriented x 3, Strength and sensation are intact, follows commands Psych: euthymic mood, full affect  Wt Readings from Last 3 Encounters:  11/06/17 201 lb (91.2 kg)  10/14/17 200 lb (90.7 kg)  03/04/17 205 lb (93 kg)      Studies/Labs Reviewed:   EKG:  EKG was ordered today and personally reviewed by me and demonstrates atrial fib 51bpm, RBBB, otherwise nonacute. RBBB is chronic.  Recent Labs: 02/06/2017: ALT 22 03/04/2017: BUN 15; Creatinine, Ser 1.12; Hemoglobin 14.9; Platelets 180; Potassium 4.3; Sodium 138   Lipid Panel    Component Value Date/Time   CHOL 126 05/15/2017 0807   TRIG 66 05/15/2017 0807   TRIG 80 05/07/2006 1036   HDL 52 05/15/2017 0807   CHOLHDL 2.4 05/15/2017 0807   CHOLHDL 2.2 02/06/2016 0745   VLDL 12 02/06/2016 0745   LDLCALC 61 05/15/2017 0807    Additional studies/ records that were reviewed today  include: Summarized above.    ASSESSMENT & PLAN:   1. CAD - no recent angina. He is not on ASA due to concomitant Eliquis. See below regarding reduction in beta blocker. Continue statin and Zetia. His lipids are followed by primary care. His mild chronic dyspnea is unchanged. 2. Persistent atrial fibrillation - blood pressure and HR are lower in clinic today, and were similar when seen by primary care recently. Will check labs including lytes, thyroid. Will decrease atenolol to 25mg  daily and f/u in several weeks. I wonder if this will improve his gait and dyspnea. He may have some underlying conduction disease as evidenced by his chronic RBBB. If need be, can reduce atenolol further at f/u visit. He has no unstable symptoms in clinic today. 3. Hypertension with hypotension - BP running softer. As above, decrease atenolol and check labs. 4. Hyperlipidemia - primary care note indicates their plan to f/u in several months to recheck. 5. Carotid artery disease - due for carotid duplex, will arrange.  Disposition: F/u with myself or care team APP in 2-3 weeks.  Medication Adjustments/Labs and Tests Ordered: Current medicines are reviewed at length with the patient today.  Concerns regarding medicines are outlined above. Medication changes, Labs and Tests ordered today are summarized above and listed in the Patient Instructions accessible in Encounters.   Signed, Charlie Pitter, PA-C  11/06/2017 9:41 AM    [address], [place], [place]  [address] Phone: [phone]; Fax: [phone]

## 2017-11-06 ENCOUNTER — Encounter: Payer: Self-pay | Admitting: Physician Assistant

## 2017-11-06 ENCOUNTER — Ambulatory Visit: Payer: Medicare HMO | Admitting: Physician Assistant

## 2017-11-06 ENCOUNTER — Other Ambulatory Visit: Payer: Self-pay | Admitting: Physician Assistant

## 2017-11-06 VITALS — BP 102/64 | HR 51 | Ht 72.0 in | Wt 201.0 lb

## 2017-11-06 DIAGNOSIS — I481 Persistent atrial fibrillation: Secondary | ICD-10-CM

## 2017-11-06 DIAGNOSIS — E785 Hyperlipidemia, unspecified: Secondary | ICD-10-CM | POA: Diagnosis not present

## 2017-11-06 DIAGNOSIS — I2510 Atherosclerotic heart disease of native coronary artery without angina pectoris: Secondary | ICD-10-CM | POA: Diagnosis not present

## 2017-11-06 DIAGNOSIS — R001 Bradycardia, unspecified: Secondary | ICD-10-CM | POA: Diagnosis not present

## 2017-11-06 DIAGNOSIS — I6523 Occlusion and stenosis of bilateral carotid arteries: Secondary | ICD-10-CM | POA: Diagnosis not present

## 2017-11-06 DIAGNOSIS — I959 Hypotension, unspecified: Secondary | ICD-10-CM | POA: Diagnosis not present

## 2017-11-06 DIAGNOSIS — I4819 Other persistent atrial fibrillation: Secondary | ICD-10-CM

## 2017-11-06 DIAGNOSIS — I10 Essential (primary) hypertension: Secondary | ICD-10-CM | POA: Diagnosis not present

## 2017-11-06 LAB — CBC
HEMATOCRIT: 45.2 % (ref 37.5–51.0)
Hemoglobin: 14.8 g/dL (ref 13.0–17.7)
MCH: 31.2 pg (ref 26.6–33.0)
MCHC: 32.7 g/dL (ref 31.5–35.7)
MCV: 95 fL (ref 79–97)
PLATELETS: 202 10*3/uL (ref 150–450)
RBC: 4.74 x10E6/uL (ref 4.14–5.80)
RDW: 14 % (ref 12.3–15.4)
WBC: 5.5 10*3/uL (ref 3.4–10.8)

## 2017-11-06 LAB — BASIC METABOLIC PANEL
BUN/Creatinine Ratio: 17 (ref 10–24)
BUN: 18 mg/dL (ref 8–27)
CO2: 23 mmol/L (ref 20–29)
CREATININE: 1.04 mg/dL (ref 0.76–1.27)
Calcium: 9.5 mg/dL (ref 8.6–10.2)
Chloride: 103 mmol/L (ref 96–106)
GFR, EST AFRICAN AMERICAN: 78 mL/min/{1.73_m2} (ref >59)
GFR, EST NON AFRICAN AMERICAN: 67 mL/min/{1.73_m2} (ref >59)
Glucose: 97 mg/dL (ref 65–99)
POTASSIUM: 4.5 mmol/L (ref 3.5–5.2)
SODIUM: 142 mmol/L (ref 134–144)

## 2017-11-06 LAB — MAGNESIUM: Magnesium: 2 mg/dL (ref 1.6–2.3)

## 2017-11-06 LAB — TSH: TSH: 1.7 u[IU]/mL (ref 0.450–4.500)

## 2017-11-06 MED ORDER — ATENOLOL 25 MG PO TABS: 25.0000 mg | ORAL_TABLET | Freq: Every day | ORAL | 3 refills | Status: DC

## 2017-11-06 NOTE — Patient Instructions (Addendum)
Medication Instructions:  Your physician has recommended you make the following change in your medication:   1. DECREASE ATENOLOL TO 25 MG DAILY. NEW PRESCRIPTION HAS BEEN SENT IN.  Labwork: TODAY: BMET, MAGNESIUM, CBC, TSH  Testing/Procedures: Your physician has requested that you have a carotid duplex. This test is an ultrasound of the carotid arteries in your neck. It looks at blood flow through these arteries that supply the brain with blood. Allow one hour for this exam. There are no restrictions or special instructions.    Follow-Up: Your physician recommends that you schedule a follow-up appointment in: JUNE 19TH AT 11:30 AM WITH MICHELE LENZE, PA-C. ARRIVE BY 11:15 AM FOR APPOINTMENT  Any Other Special Instructions Will Be Listed Below (If Applicable).     If you need a refill on your cardiac medications before your next appointment, please call your pharmacy.

## 2017-11-07 ENCOUNTER — Ambulatory Visit (HOSPITAL_COMMUNITY)
Admission: RE | Admit: 2017-11-07 | Discharge: 2017-11-07 | Disposition: A | Discharge status: Home / Self Care | Payer: Medicare HMO | Source: Ambulatory Visit | Attending: Physician Assistant | Admitting: Physician Assistant

## 2017-11-07 DIAGNOSIS — R51 Headache: Secondary | ICD-10-CM | POA: Insufficient documentation

## 2017-11-07 DIAGNOSIS — E785 Hyperlipidemia, unspecified: Secondary | ICD-10-CM | POA: Diagnosis not present

## 2017-11-07 DIAGNOSIS — I2510 Atherosclerotic heart disease of native coronary artery without angina pectoris: Secondary | ICD-10-CM | POA: Insufficient documentation

## 2017-11-07 DIAGNOSIS — Z87891 Personal history of nicotine dependence: Secondary | ICD-10-CM | POA: Diagnosis not present

## 2017-11-07 DIAGNOSIS — I6523 Occlusion and stenosis of bilateral carotid arteries: Secondary | ICD-10-CM | POA: Diagnosis not present

## 2017-11-07 DIAGNOSIS — R42 Dizziness and giddiness: Secondary | ICD-10-CM | POA: Diagnosis not present

## 2017-11-07 DIAGNOSIS — Z8673 Personal history of transient ischemic attack (TIA), and cerebral infarction without residual deficits: Secondary | ICD-10-CM | POA: Insufficient documentation

## 2017-11-18 ENCOUNTER — Other Ambulatory Visit: Payer: Self-pay | Admitting: Cardiovascular Disease

## 2017-11-26 NOTE — Progress Notes (Signed)
Cardiology Office Note    Date:  11/27/2017   ID:  Luis [name], DOB 12/22/1934, MRN [id number]  PCP:  Vivi Barrack, MD  Cardiologist: Lauree Chandler, MD  No chief complaint on file.   History of Present Illness:  Luis [name] is a 83 y.o. male with history of CAD status post BMS to the RCA, BMS to the circumflex and PTCA of the OM1 in 2001, overlapping DES to the RCA and PTCA of the small circumflex 2013.  Last cath 2016 for dyspnea patent stents with normal LV function medical therapy recommended TIA and found to be in atrial fibrillation 2016 treated with atenolol and Eliquis.  Also has hypertension, HLD, RBBB.  Patient saw Melina Copa, PA-C 11/06/2017 blood pressure and heart rate were low.  He was complaining of dyspnea and gait disturbance.  She decreased her atenolol to 25 mg once daily.  C met, TSH and CBC were completely normal.  Carotid Dopplers were repeated and were stable.  Patient comes in today for follow-up.  He says he has had some dizziness with walking and gait disturbance ever since he had retinal detachment and was treated at [facility].  He has no peripheral vision now that he thinks about it has been a problem since he lost his vision.  Blood pressures have been better at home at 120/70 pulse is been in the 60s although they are lower here today.  No orthostatic type dizziness.  Does not feel his heart racing or skipping.  Asking for referral to see GI within the [facility] system.  Having a lot of gas.  Cologuard was negative that PCP.  Had a colonoscopy by Dr. Earlean Shawl 10 years ago but when he went for follow-up was told they do not accept Medicare.   Past Medical History:  Diagnosis Date  . CAD (coronary artery disease)    a. BMS-RCA, BMS-Cx, PTCA of OM1 in 2001. b. overlapping DES in RCA and PTCA to small distal Cx 2013. c. Cath 2016 -> stable CAD, suspect dyspnea unrelated to coronary disease.  Marland Kitchen COLONIC POLYPS, HX OF    Dr Earlean Shawl  . DJD (degenerative joint  disease)   . Essential hypertension   . Hyperlipidemia   . Myocardial infarction ([place]) 08/1999  . Persistent atrial fibrillation ([place])    a. dx 2016.  Marland Kitchen RBBB   . TIA (transient ischemic attack)     Past Surgical History:  Procedure Laterality Date  . CARDIAC CATHETERIZATION  ~ 2002  . CARDIAC CATHETERIZATION N/A 03/02/2015   Procedure: Left Heart Cath and Coronary Angiography;  Surgeon: Sherren Mocha, MD;  Location: [place] CV [facility];  Service: Cardiovascular;  Laterality: N/A;  . CATARACT EXTRACTION W/ INTRAOCULAR LENS  IMPLANT, BILATERAL  ~ 2007   bilaterally  . CORONARY ANGIOPLASTY  07/30/11  . CORONARY ANGIOPLASTY WITH STENT PLACEMENT  2001; 07/30/11;   "2+3"  . INGUINAL HERNIA REPAIR  2005   left  . LEFT HEART CATHETERIZATION WITH CORONARY ANGIOGRAM N/A 07/30/2011   Procedure: LEFT HEART CATHETERIZATION WITH CORONARY ANGIOGRAM;  Surgeon: Burnell Blanks, MD;  Location: [facility] CATH LAB;  Service: Cardiovascular;  Laterality: N/A;  . PERCUTANEOUS CORONARY STENT INTERVENTION (PCI-S)  07/30/2011   Procedure: PERCUTANEOUS CORONARY STENT INTERVENTION (PCI-S);  Surgeon: Burnell Blanks, MD;  Location: [facility] CATH LAB;  Service: Cardiovascular;;  . RETINAL DETACHMENT SURGERY  05/2008; 06/2008; 07/2008   left; "all 3 surgeries have failed"    Current Medications: Current Meds  Medication Sig  .  atenolol (TENORMIN) 25 MG tablet Take 1 tablet (25 mg total) by mouth daily.  Marland Kitchen atorvastatin (LIPITOR) 80 MG tablet TAKE 1 TABLET ONCE DAILY.  Marland Kitchen ELIQUIS 5 MG TABS tablet TAKE 1 TABLET BY MOUTH TWICE DAILY.  Marland Kitchen ezetimibe (ZETIA) 10 MG tablet TAKE (1) TABLET DAILY AT BEDTIME.  . folic acid (FOLVITE) 1 MG tablet Take by mouth.  . Glucosamine HCl 1000 MG TABS Take 1 tablet by mouth daily.   Marland Kitchen MELATONIN PO Take 1 tablet by mouth as needed.   . Misc Natural Products (BLACK CHERRY CONCENTRATE PO) Take by mouth.  Marland Kitchen NITROSTAT 0.4 MG SL tablet DISSOLVE 1 TABLET UNDER TONGUE AS NEEDED FOR CHEST  PAIN,MAY REPEAT IN5 MINUTES FOR 2 DOSES.  Marland Kitchen Potassium Aminobenzoate (POTABA) 500 MG TABS Take 1 tablet by mouth daily.  . Saw Palmetto 500 MG CAPS Take by mouth.  . vitamin B-12 (CYANOCOBALAMIN) 1000 MCG tablet Take 1,000 mcg by mouth daily.  . vitamin E 1000 UNIT capsule Take 2,000 Units by mouth daily.     Allergies:   Patient has no known allergies.   Social History   Socioeconomic History  . Marital status: Married    Spouse name: Not on file  . Number of children: Not on file  . Years of education: Not on file  . Highest education level: Not on file  Occupational History  . Not on file  Social Needs  . Financial resource strain: Not on file  . Food insecurity:    Worry: Not on file    Inability: Not on file  . Transportation needs:    Medical: Not on file    Non-medical: Not on file  Tobacco Use  . Smoking status: Former Smoker    Packs/day: 0.50    Years: 40.00    Pack years: 20.00    Types: Cigarettes    Last attempt to quit: 06/12/1999    Years since quitting: 18.4  . Smokeless tobacco: Never Used  Substance and Sexual Activity  . Alcohol use: No    Comment: 07/30/11 "recovering alcoholic; since 01/16/4165"  . Drug use: No  . Sexual activity: Not Currently  Lifestyle  . Physical activity:    Days per week: Not on file    Minutes per session: Not on file  . Stress: Not on file  Relationships  . Social connections:    Talks on phone: Not on file    Gets together: Not on file    Attends religious service: Not on file    Active member of club or organization: Not on file    Attends meetings of clubs or organizations: Not on file    Relationship status: Not on file  Other Topics Concern  . Not on file  Social History Narrative  . Not on file     Family History:  The patient's family history includes Alcohol abuse in his mother; Colon cancer in his father; Prostate cancer in his father.   ROS:   Please see the history of present illness.    Review of  Systems  Constitution: Negative.  HENT: Negative.   Eyes: Positive for vision loss in left eye and visual disturbance.  Cardiovascular: Negative.   Respiratory: Negative.   Endocrine: Negative.   Hematologic/Lymphatic: Negative.   Musculoskeletal: Negative.   Gastrointestinal: Positive for constipation and flatus.  Genitourinary: Negative.   Neurological: Positive for dizziness.   All other systems reviewed and are negative.   PHYSICAL EXAM:   VS:  BP 106/64   Pulse (!) 54   Ht 6' (1.829 m)   Wt 198 lb (89.8 kg)   SpO2 97%   BMI 26.85 kg/m   Physical Exam  GEN: Well nourished, well developed, looks younger than stated age, in no acute distress  Neck: no JVD, carotid bruits, or masses Cardiac: Irregular irregular; no murmurs, rubs, or gallops  Respiratory:  clear to auscultation bilaterally, normal work of breathing GI: soft, nontender, nondistended, + BS Ext: without cyanosis, clubbing, or edema, Good distal pulses bilaterally Neuro:  Alert and Oriented x 3 Psych: euthymic mood, full affect  Wt Readings from Last 3 Encounters:  11/27/17 198 lb (89.8 kg)  11/06/17 201 lb (91.2 kg)  10/14/17 200 lb (90.7 kg)      Studies/Labs Reviewed:   EKG:  EKG is not ordered today.  Recent Labs: 02/06/2017: ALT 22 11/06/2017: BUN 18; Creatinine, Ser 1.04; Hemoglobin 14.8; Magnesium 2.0; Platelets 202; Potassium 4.5; Sodium 142; TSH 1.700   Lipid Panel    Component Value Date/Time   CHOL 126 05/15/2017 0807   TRIG 66 05/15/2017 0807   TRIG 80 05/07/2006 1036   HDL 52 05/15/2017 0807   CHOLHDL 2.4 05/15/2017 0807   CHOLHDL 2.2 02/06/2016 0745   VLDL 12 02/06/2016 0745   LDLCALC 61 05/15/2017 0807    Additional studies/ records that were reviewed today include:  Carotid Dopplers 5/30/2019Final Interpretation: Right Carotid: Velocities in the right ICA are consistent with a 1-39% stenosis.                Non-hemodynamically significant plaque <50% noted in the CCA.  Left  Carotid: Velocities in the left ICA are consistent with a 1-39% stenosis.               Non-hemodynamically significant plaque noted in the CCA.  Vertebrals:  Bilateral vertebral arteries demonstrate antegrade flow. Subclavians: Normal flow hemodynamics were seen in bilateral subclavian              arteries. Cardiac catheterization 9/21/2016Procedures   Left Heart Cath and Coronary Angiography  Conclusion    Prox RCA lesion, 20% stenosed. A drug-eluting stent was placed. The lesion was previously treated with a drug-eluting stent .  Dist RCA lesion, 10% stenosed. A drug-eluting stent was placed. The lesion was previously treated with a drug-eluting stent .  Mid RCA lesion, 30% stenosed.  Mid Cx lesion, 50% stenosed. The lesion was previously treated with a stent (unknown type) .  3rd Mrg lesion, 90% stenosed.  2nd Mrg lesion, 70% stenosed.  Prox LAD lesion, 30% stenosed.  Mid LAD lesion, 30% stenosed.  The left ventricular systolic function is normal.   1. Stable CAD with continued patency of the stented segments in the RCA, mild stenosis of the LCx stent, Mild diffuse nonobstructive stenosis of the LAD, and severe distal stenosis of small OM subranches 2. Normal LV function   Recommendation: continued medical therapy for stable CAD. Suspect dyspnea is unrelated to coronary obstruction.     Echocardiogram 11/30/2014 study Conclusions   - Left ventricle: The cavity size was normal. There was mild   concentric hypertrophy. Systolic function was vigorous. The   estimated ejection fraction was in the range of 65% to 70%. Wall   motion was normal; there were no regional wall motion   abnormalities. There was no evidence of elevated ventricular   filling pressure by Doppler parameters.   Impressions:   - No cardiac source of emboli was indentified.  ASSESSMENT:    1. Atherosclerosis of native coronary artery of native heart without angina pectoris   2. Persistent  atrial fibrillation ([place])   3. Essential hypertension   4. Other hyperlipidemia   5. Gas pain   6. Dizziness      PLAN:  In order of problems listed above:  CAD status post BMS to the RCA, BMS to the circumflex and PTCA of the OM1 in 2001, overlapping DES to the RCA and PTCA of the small circumflex 2013.  Last cath 2016 for dyspnea patent stents with normal LV function medical therapy recommended   Persistent atrial fibrillation on Eliquis and beta-blocker-heart rate controlled on lower dose atenolol.  Still in the 50s here but would prefer to keep him on this dose.  Essential hypertension blood pressure was on the low side and he was bradycardic last office visit.  Atenolol decreased to 25 mg daily.  Blood pressure still on the low side but tolerating it well.  Hyperlipidemia LDL 60 112/2018 not on any therapy  Dizziness with loss of peripheral vision secondary to retinal detachment years ago seen at [facility].  Has had the same dizziness for a long time and change with decrease in atenolol.  Recommend he follow-up with his ophthalmologist.    Medication Adjustments/Labs and Tests Ordered: Current medicines are reviewed at length with the patient today.  Concerns regarding medicines are outlined above.  Medication changes, Labs and Tests ordered today are listed in the Patient Instructions below. Patient Instructions  Medication Instructions:  Your physician recommends that you continue on your current medications as directed. Please refer to the Current Medication list given to you today.   Labwork: None ordered  Testing/Procedures: None ordered  Follow-Up: Your physician wants you to follow-up in: 6-8 MONTHS WITH DR. Angelena Form   You will receive a reminder letter in the mail two months in advance. If you don't receive a letter, please call our office to schedule the follow-up appointment.   You have been referred to [facility].  They will contact you for an  appointment.    Any Other Special Instructions Will Be Listed Below (If Applicable).     If you need a refill on your cardiac medications before your next appointment, please call your pharmacy.      Sumner Boast, PA-C  11/27/2017 11:48 AM    [facility] [address], [place], [place]  [address] Phone: [phone]; Fax: [phone]

## 2017-11-27 ENCOUNTER — Encounter: Payer: Self-pay | Admitting: Physician Assistant

## 2017-11-27 ENCOUNTER — Other Ambulatory Visit: Payer: Self-pay | Admitting: Cardiovascular Disease

## 2017-11-27 ENCOUNTER — Ambulatory Visit: Payer: Medicare HMO | Admitting: Physician Assistant

## 2017-11-27 VITALS — BP 106/64 | HR 54 | Ht 72.0 in | Wt 198.0 lb

## 2017-11-27 DIAGNOSIS — I2510 Atherosclerotic heart disease of native coronary artery without angina pectoris: Secondary | ICD-10-CM | POA: Diagnosis not present

## 2017-11-27 DIAGNOSIS — I481 Persistent atrial fibrillation: Secondary | ICD-10-CM

## 2017-11-27 DIAGNOSIS — R141 Gas pain: Secondary | ICD-10-CM

## 2017-11-27 DIAGNOSIS — R42 Dizziness and giddiness: Secondary | ICD-10-CM | POA: Insufficient documentation

## 2017-11-27 DIAGNOSIS — I10 Essential (primary) hypertension: Secondary | ICD-10-CM | POA: Diagnosis not present

## 2017-11-27 DIAGNOSIS — I4819 Other persistent atrial fibrillation: Secondary | ICD-10-CM

## 2017-11-27 DIAGNOSIS — E7849 Other hyperlipidemia: Secondary | ICD-10-CM

## 2017-11-27 NOTE — Patient Instructions (Addendum)
Medication Instructions:  Your physician recommends that you continue on your current medications as directed. Please refer to the Current Medication list given to you today.   Labwork: None ordered  Testing/Procedures: None ordered  Follow-Up: Your physician wants you to follow-up in: 6-8 MONTHS WITH DR. Angelena Form   You will receive a reminder letter in the mail two months in advance. If you don't receive a letter, please call our office to schedule the follow-up appointment.   You have been referred to [facility].  They will contact you for an appointment.    Any Other Special Instructions Will Be Listed Below (If Applicable).     If you need a refill on your cardiac medications before your next appointment, please call your pharmacy.

## 2017-11-28 ENCOUNTER — Encounter: Payer: Self-pay | Admitting: Gastroenterology

## 2017-12-15 DIAGNOSIS — S7001XA Contusion of right hip, initial encounter: Secondary | ICD-10-CM | POA: Diagnosis not present

## 2017-12-15 DIAGNOSIS — R079 Chest pain, unspecified: Secondary | ICD-10-CM | POA: Diagnosis not present

## 2017-12-15 DIAGNOSIS — S20212A Contusion of left front wall of thorax, initial encounter: Secondary | ICD-10-CM | POA: Diagnosis not present

## 2017-12-15 DIAGNOSIS — M25551 Pain in right hip: Secondary | ICD-10-CM | POA: Diagnosis not present

## 2017-12-16 ENCOUNTER — Ambulatory Visit: Payer: Medicare HMO | Admitting: Physician Assistant

## 2018-01-15 ENCOUNTER — Other Ambulatory Visit: Payer: Self-pay | Admitting: Cardiovascular Disease

## 2018-01-28 ENCOUNTER — Ambulatory Visit: Payer: Medicare HMO | Admitting: Gastroenterology

## 2018-01-28 ENCOUNTER — Encounter: Payer: Self-pay | Admitting: Gastroenterology

## 2018-01-28 VITALS — BP 128/62 | HR 53 | Ht 72.0 in | Wt 205.0 lb

## 2018-01-28 DIAGNOSIS — K5900 Constipation, unspecified: Secondary | ICD-10-CM | POA: Diagnosis not present

## 2018-01-28 MED ORDER — LINACLOTIDE 145 MCG PO CAPS: 145.0000 ug | ORAL_CAPSULE | Freq: Every day | ORAL | 5 refills | Status: DC

## 2018-01-28 NOTE — Patient Instructions (Addendum)
linzess 172mcg, one pill once daily. Call in 6 weeks to report on your response.  Normal BMI (Body Mass Index- based on height and weight) is between 23 and 30. Your BMI today is Body mass index is 27.8 kg/m. Marland Kitchen Please consider follow up  regarding your BMI with your Primary Care Provider.

## 2018-01-28 NOTE — Progress Notes (Signed)
HPI: This is a very pleasant 83 year old man   who was referred to me by his cardiologist for constipation, gassiness  Chief complaint is constipation, gassiness  His father had colon cancer.  He is not sure how old his father was when he was diagnosed  constiption for many many years.  Also a lot of gas lately.  Swiss Chris laxative, he takes this on a daily basis.  Weight overall stable.  Never blood in his stool.   Old Data Reviewed:  cologuard neg 10/2017   Review of systems: Pertinent positive and negative review of systems were noted in the above HPI section. All other review negative.   Past Medical History:  Diagnosis Date  . CAD (coronary artery disease)    a. BMS-RCA, BMS-Cx, PTCA of OM1 in 2001. b. overlapping DES in RCA and PTCA to small distal Cx 2013. c. Cath 2016 -> stable CAD, suspect dyspnea unrelated to coronary disease.  Marland Kitchen COLONIC POLYPS, HX OF    Dr Earlean Shawl  . DJD (degenerative joint disease)   . Essential hypertension   . Hyperlipidemia   . Myocardial infarction ([place]) 08/1999  . Persistent atrial fibrillation ([place])    a. dx 2016.  Marland Kitchen RBBB   . TIA (transient ischemic attack)     Past Surgical History:  Procedure Laterality Date  . CARDIAC CATHETERIZATION  ~ 2002  . CARDIAC CATHETERIZATION N/A 03/02/2015   Procedure: Left Heart Cath and Coronary Angiography;  Surgeon: Sherren Mocha, MD;  Location: [facility];  Service: Cardiovascular;  Laterality: N/A;  . CATARACT EXTRACTION W/ INTRAOCULAR LENS  IMPLANT, BILATERAL  ~ 2007   bilaterally  . CORONARY ANGIOPLASTY  07/30/11  . CORONARY ANGIOPLASTY WITH STENT PLACEMENT  2001; 07/30/11;   "2+3"  . INGUINAL HERNIA REPAIR  2005   left  . LEFT HEART CATHETERIZATION WITH CORONARY ANGIOGRAM N/A 07/30/2011   Procedure: LEFT HEART CATHETERIZATION WITH CORONARY ANGIOGRAM;  Surgeon: Burnell Blanks, MD;  Location: [facility] C[facility];  Service: Cardiovascular;  Laterality: N/A;  . PERCUTANEOUS CORONARY STENT  INTERVENTION (PCI-S)  07/30/2011   Procedure: PERCUTANEOUS CORONARY STENT INTERVENTION (PCI-S);  Surgeon: Burnell Blanks, MD;  Location: [facility] CATH LAB;  Service: Cardiovascular;;  . RETINAL DETACHMENT SURGERY  05/2008; 06/2008; 07/2008   left; "all 3 surgeries have failed"    Current Outpatient Medications  Medication Sig Dispense Refill  . atenolol (TENORMIN) 25 MG tablet Take 1 tablet (25 mg total) by mouth daily. 180 tablet 3  . atorvastatin (LIPITOR) 80 MG tablet TAKE 1 TABLET ONCE DAILY. 90 tablet 3  . ELIQUIS 5 MG TABS tablet TAKE 1 TABLET BY MOUTH TWICE DAILY. 60 tablet 5  . ezetimibe (ZETIA) 10 MG tablet TAKE (1) TABLET DAILY AT BEDTIME. 90 tablet 3  . folic acid (FOLVITE) 1 MG tablet Take by mouth.    . Glucosamine HCl 1000 MG TABS Take 1 tablet by mouth daily.     Marland Kitchen MELATONIN PO Take 1 tablet by mouth as needed.     . Misc Natural Products (BLACK CHERRY CONCENTRATE PO) Take by mouth.    Marland Kitchen NITROSTAT 0.4 MG SL tablet DISSOLVE 1 TABLET UNDER TONGUE AS NEEDED FOR CHEST PAIN,MAY REPEAT IN5 MINUTES FOR 2 DOSES. 25 tablet 3  . NON FORMULARY     . Potassium Aminobenzoate (POTABA) 500 MG TABS Take 1 tablet by mouth daily.    . Saw Palmetto 500 MG CAPS Take by mouth.    . vitamin B-12 (CYANOCOBALAMIN) 1000 MCG  tablet Take 1,000 mcg by mouth daily.    . vitamin E 1000 UNIT capsule Take 2,000 Units by mouth daily.     No current facility-administered medications for this visit.     Allergies as of 01/28/2018  . (No Known Allergies)    Family History  Problem Relation Age of Onset  . Colon cancer Father        in 43s  . Prostate cancer Father   . Alcohol abuse Mother     Social History   Socioeconomic History  . Marital status: Married    Spouse name: Not on file  . Number of children: Not on file  . Years of education: Not on file  . Highest education level: Not on file  Occupational History  . Not on file  Social Needs  . Financial resource strain: Not on file  .  Food insecurity:    Worry: Not on file    Inability: Not on file  . Transportation needs:    Medical: Not on file    Non-medical: Not on file  Tobacco Use  . Smoking status: Former Smoker    Packs/day: 0.50    Years: 40.00    Pack years: 20.00    Types: Cigarettes    Last attempt to quit: 06/12/1999    Years since quitting: 18.6  . Smokeless tobacco: Never Used  Substance and Sexual Activity  . 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 Drug use: No  . Sexual activity: Not Currently  Lifestyle  . Physical activity:    Days per week: Not on file    Minutes per session: Not on file  . Stress: Not on file  Relationships  . Social connections:    Talks on phone: Not on file    Gets together: Not on file    Attends religious service: Not on file    Active member of club or organization: Not on file    Attends meetings of clubs or organizations: Not on file    Relationship status: Not on file  . Intimate partner violence:    Fear of current or ex partner: Not on file    Emotionally abused: Not on file    Physically abused: Not on file    Forced sexual activity: Not on file  Other Topics Concern  . Not on file  Social History Narrative  . Not on file     Physical Exam: BP 128/62   Pulse (!) 53   Ht 6' (1.829 m)   Wt 205 lb (93 kg)   BMI 27.80 kg/m  Constitutional: generally well-appearing Psychiatric: alert and oriented x3 Eyes: extraocular movements intact Mouth: oral pharynx moist, no lesions Neck: supple no lymphadenopathy Cardiovascular: heart regular rate and rhythm Lungs: clear to auscultation bilaterally Abdomen: soft, nontender, nondistended, no obvious ascites, no peritoneal signs, normal bowel sounds Extremities: no lower extremity edema bilaterally Skin: no lesions on visible extremities   Assessment and plan: 83 y.o. male with chronic constipation, gassiness  First he had a Cologuard colon cancer screening test 3  months ago and it was negative.  He does not need repeat colon cancer screening for at least 3 years from now.  His constipation is chronic, nearly lifelong.  And I think that this is causing a lot of his gassiness and some of his rectal discomforts that he describes.  I recommended a trial of Linzess 145 mcg dose 1 pill once daily and  he will call to report on his response in 5 or 6 weeks.   Please see the "Patient Instructions" section for addition details about the plan.   Owens Loffler, MD [facility] 01/28/2018, 2:44 PM  Cc: Vivi Barrack, MD

## 2018-02-13 ENCOUNTER — Other Ambulatory Visit: Payer: Self-pay

## 2018-02-13 ENCOUNTER — Telehealth: Payer: Self-pay

## 2018-02-13 ENCOUNTER — Other Ambulatory Visit (INDEPENDENT_AMBULATORY_CARE_PROVIDER_SITE_OTHER): Payer: Medicare HMO

## 2018-02-13 DIAGNOSIS — I4891 Unspecified atrial fibrillation: Secondary | ICD-10-CM | POA: Diagnosis not present

## 2018-02-13 DIAGNOSIS — E782 Mixed hyperlipidemia: Secondary | ICD-10-CM

## 2018-02-13 DIAGNOSIS — Z125 Encounter for screening for malignant neoplasm of prostate: Secondary | ICD-10-CM

## 2018-02-13 LAB — CBC
HEMATOCRIT: 45.4 % (ref 39.0–52.0)
Hemoglobin: 15.4 g/dL (ref 13.0–17.0)
MCHC: 33.9 g/dL (ref 30.0–36.0)
MCV: 92.6 fl (ref 78.0–100.0)
Platelets: 194 10*3/uL (ref 150.0–400.0)
RBC: 4.91 Mil/uL (ref 4.22–5.81)
RDW: 14.3 % (ref 11.5–15.5)
WBC: 5.5 10*3/uL (ref 4.0–10.5)

## 2018-02-13 LAB — COMPREHENSIVE METABOLIC PANEL
ALT: 30 U/L (ref 0–53)
AST: 23 U/L (ref 0–37)
Albumin: 4.2 g/dL (ref 3.5–5.2)
Alkaline Phosphatase: 64 U/L (ref 39–117)
BILIRUBIN TOTAL: 0.8 mg/dL (ref 0.2–1.2)
BUN: 17 mg/dL (ref 6–23)
CALCIUM: 9.6 mg/dL (ref 8.4–10.5)
CO2: 28 meq/L (ref 19–32)
Chloride: 106 mEq/L (ref 96–112)
Creatinine, Ser: 1.1 mg/dL (ref 0.40–1.50)
GFR: 68.28 mL/min (ref >60.00)
Glucose, Bld: 101 mg/dL — ABNORMAL HIGH (ref 70–99)
Potassium: 4.6 mEq/L (ref 3.5–5.1)
Sodium: 141 mEq/L (ref 135–145)
Total Protein: 6.4 g/dL (ref 6.0–8.3)

## 2018-02-13 LAB — LIPID PANEL
CHOLESTEROL: 133 mg/dL (ref 0–200)
HDL: 58 mg/dL (ref >39.00)
LDL CALC: 61 mg/dL (ref 0–99)
NonHDL: 75.39
Total CHOL/HDL Ratio: 2
Triglycerides: 71 mg/dL (ref 0.0–149.0)
VLDL: 14.2 mg/dL (ref 0.0–40.0)

## 2018-02-13 LAB — TSH: TSH: 2.4 u[IU]/mL (ref 0.35–4.50)

## 2018-02-13 LAB — PSA: PSA: 3.34 ng/mL (ref 0.10–4.00)

## 2018-02-13 NOTE — Telephone Encounter (Signed)
Pt requested to have PSA checked with labs that were drawn this morning. Please place a future order if okay to add on. Thank you.

## 2018-02-13 NOTE — Addendum Note (Signed)
Addended by: Frutoso Chase A on: 02/13/2018 08:22 AM   Modules accepted: Orders

## 2018-02-13 NOTE — Telephone Encounter (Signed)
Ok with me. Please place any necessary orders. 

## 2018-02-13 NOTE — Telephone Encounter (Signed)
Order has been placed.

## 2018-02-18 ENCOUNTER — Encounter: Payer: Self-pay | Admitting: Family Medicine

## 2018-02-18 ENCOUNTER — Ambulatory Visit (INDEPENDENT_AMBULATORY_CARE_PROVIDER_SITE_OTHER): Payer: Medicare HMO | Admitting: Family Medicine

## 2018-02-18 VITALS — BP 124/78 | HR 64 | Temp 97.6°F | Ht 72.0 in | Wt 206.4 lb

## 2018-02-18 DIAGNOSIS — I481 Persistent atrial fibrillation: Secondary | ICD-10-CM

## 2018-02-18 DIAGNOSIS — Z0001 Encounter for general adult medical examination with abnormal findings: Secondary | ICD-10-CM | POA: Diagnosis not present

## 2018-02-18 DIAGNOSIS — I10 Essential (primary) hypertension: Secondary | ICD-10-CM

## 2018-02-18 DIAGNOSIS — Z23 Encounter for immunization: Secondary | ICD-10-CM

## 2018-02-18 DIAGNOSIS — E7849 Other hyperlipidemia: Secondary | ICD-10-CM

## 2018-02-18 DIAGNOSIS — G4700 Insomnia, unspecified: Secondary | ICD-10-CM

## 2018-02-18 DIAGNOSIS — L989 Disorder of the skin and subcutaneous tissue, unspecified: Secondary | ICD-10-CM

## 2018-02-18 DIAGNOSIS — I4819 Other persistent atrial fibrillation: Secondary | ICD-10-CM

## 2018-02-18 NOTE — Progress Notes (Addendum)
Subjective:  Luis [name] is a 83 y.o. male who presents today for his annual comprehensive physical exam.    HPI:  He has no acute complaints today.   He has a few chronic condition outlined below:  1. Insomnia.  Has difficulty staying asleep.  Usually wakes up around 3-4 shingles vaccine a couple hours later.  He has tried Advil PM in the past which is not working.  Is also tried melatonin in the past.  Does not drink caffeine in the afternoon.  2. HLD. Currently on lipitor 80mg  daily and the 80 mg daily.  Tolerating well.  3.  A. fib.  Currently on atenolol 25 mg daily.  Because 5 mg tablet twice daily.  Lifestyle Diet: No specific diet Exercise: No specific exercise.  Depression screen PHQ 2/9 10/14/2017  Decreased Interest 0  Down, Depressed, Hopeless 0  PHQ - 2 Score 0    There are no preventive care reminders to display for this patient.   ROS: Per HPI, otherwise a complete review of systems was negative.   PMH:  The following were reviewed and entered/updated in epic: Past Medical History:  Diagnosis Date  . CAD (coronary artery disease)    a. BMS-RCA, BMS-Cx, PTCA of OM1 in 2001. b. overlapping DES in RCA and PTCA to small distal Cx 2013. c. Cath 2016 -> stable CAD, suspect dyspnea unrelated to coronary disease.  Marland Kitchen COLONIC POLYPS, HX OF    Dr Earlean Shawl  . DJD (degenerative joint disease)   . Essential hypertension   . Hyperlipidemia   . Myocardial infarction ([place]) 08/1999  . Persistent atrial fibrillation ([place])    a. dx 2016.  Marland Kitchen RBBB   . TIA (transient ischemic attack)    Patient Active Problem List   Diagnosis Date Noted  . Insomnia 02/18/2018  . Dizziness 11/27/2017  . Unsteady gait 10/14/2017  . TIA (transient ischemic attack) 11/29/2014  . Atrial fibrillation ([place]) 11/29/2014  . HTN (hypertension) 11/29/2014  . HLD (hyperlipidemia) 10/27/2007  . Coronary atherosclerosis 10/27/2007  . COLONIC POLYPS, HX OF 10/27/2007   Past Surgical History:    Procedure Laterality Date  . CARDIAC CATHETERIZATION  ~ 2002  . CARDIAC CATHETERIZATION N/A 03/02/2015   Procedure: Left Heart Cath and Coronary Angiography;  Surgeon: Sherren Mocha, MD;  Location: [facility];  Service: Cardiovascular;  Laterality: N/A;  . CATARACT EXTRACTION W/ INTRAOCULAR LENS  IMPLANT, BILATERAL  ~ 2007   bilaterally  . CORONARY ANGIOPLASTY  07/30/11  . CORONARY ANGIOPLASTY WITH STENT PLACEMENT  2001; 07/30/11;   "2+3"  . INGUINAL HERNIA REPAIR  2005   left  . LEFT HEART CATHETERIZATION WITH CORONARY ANGIOGRAM N/A 07/30/2011   Procedure: LEFT HEART CATHETERIZATION WITH CORONARY ANGIOGRAM;  Surgeon: Burnell Blanks, MD;  Location: [facility] CATH LAB;  Service: Cardiovascular;  Laterality: N/A;  . PERCUTANEOUS CORONARY STENT INTERVENTION (PCI-S)  07/30/2011   Procedure: PERCUTANEOUS CORONARY STENT INTERVENTION (PCI-S);  Surgeon: Burnell Blanks, MD;  Location: [facility] CATH LAB;  Service: Cardiovascular;;  . RETINAL DETACHMENT SURGERY  05/2008; 06/2008; 07/2008   left; "all 3 surgeries have failed"    Family History  Problem Relation Age of Onset  . Colon cancer Father        in 62s  . Prostate cancer Father   . Alcohol abuse Mother     Medications- reviewed and updated Current Outpatient Medications  Medication Sig Dispense Refill  . atorvastatin (LIPITOR) 80 MG tablet TAKE 1 TABLET ONCE DAILY.  90 tablet 3  . ELIQUIS 5 MG TABS tablet TAKE 1 TABLET BY MOUTH TWICE DAILY. 60 tablet 5  . ezetimibe (ZETIA) 10 MG tablet TAKE (1) TABLET DAILY AT BEDTIME. 90 tablet 3  . folic acid (FOLVITE) 1 MG tablet Take by mouth.    . Glucosamine HCl 1000 MG TABS Take 1 tablet by mouth daily.     Marland Kitchen linaclotide (LINZESS) 145 MCG CAPS capsule Take 1 capsule (145 mcg total) by mouth daily. 30 capsule 5  . MELATONIN PO Take 1 tablet by mouth as needed.     . Misc Natural Products (BLACK CHERRY CONCENTRATE PO) Take by mouth.    Marland Kitchen NITROSTAT 0.4 MG SL tablet DISSOLVE 1 TABLET UNDER  TONGUE AS NEEDED FOR CHEST PAIN,MAY REPEAT IN5 MINUTES FOR 2 DOSES. 25 tablet 3  . NON FORMULARY     . Potassium Aminobenzoate (POTABA) 500 MG TABS Take 1 tablet by mouth daily.    . Saw Palmetto 500 MG CAPS Take by mouth.    . vitamin B-12 (CYANOCOBALAMIN) 1000 MCG tablet Take 1,000 mcg by mouth daily.    . vitamin E 1000 UNIT capsule Take 2,000 Units by mouth daily.    Marland Kitchen atenolol (TENORMIN) 25 MG tablet Take 1 tablet (25 mg total) by mouth daily. 180 tablet 3   No current facility-administered medications for this visit.     Allergies-reviewed and updated No Known Allergies  Social History   Socioeconomic History  . Marital status: Married    Spouse name: Not on file  . Number of children: Not on file  . Years of education: Not on file  . Highest education level: Not on file  Occupational History  . Not on file  Social Needs  . Financial resource strain: Not on file  . Food insecurity:    Worry: Not on file    Inability: Not on file  . Transportation needs:    Medical: Not on file    Non-medical: Not on file  Tobacco Use  . Smoking status: Former Smoker    Packs/day: 0.50    Years: 40.00    Pack years: 20.00    Types: Cigarettes    Last attempt to quit: 06/12/1999    Years since quitting: 18.7  . Smokeless tobacco: Never Used  Substance and Sexual Activity  . Alcohol use: No    Comment: 07/30/11 "recovering alcoholic; since 11/17/4852"  . Drug use: No  . Sexual activity: Not Currently  Lifestyle  . Physical activity:    Days per week: Not on file    Minutes per session: Not on file  . Stress: Not on file  Relationships  . Social connections:    Talks on phone: Not on file    Gets together: Not on file    Attends religious service: Not on file    Active member of club or organization: Not on file    Attends meetings of clubs or organizations: Not on file    Relationship status: Not on file  Other Topics Concern  . Not on file  Social History Narrative  . Not  on file    Objective:  Physical Exam: BP 124/78 (BP Location: Left Arm, Patient Position: Sitting, Cuff Size: Normal)   Pulse 64   Temp 97.6 F (36.4 C) (Oral)   Ht 6' (1.829 m)   Wt 206 lb 6.4 oz (93.6 kg)   SpO2 96%   BMI 27.99 kg/m   Body mass index is 27.99 kg/m.  Wt Readings from Last 3 Encounters:  02/18/18 206 lb 6.4 oz (93.6 kg)  01/28/18 205 lb (93 kg)  11/27/17 198 lb (89.8 kg)   Gen: NAD, resting comfortably HEENT: TMs normal bilaterally. OP clear. No thyromegaly noted.  CV: Irregular with no murmurs appreciated Pulm: NWOB, CTAB with no crackles, wheezes, or rhonchi GI: Normal bowel sounds present. Soft, Nontender, Nondistended. MSK: no edema, cyanosis, or clubbing noted Skin: warm, dry. 2 erythematous raised lesions on face.  Neuro: CN2-12 grossly intact. Strength 5/5 in upper and lower extremities. Reflexes symmetric and intact bilaterally.  Psych: Normal affect and thought content  Assessment/Plan:  HTN (hypertension) At goal on atenolol.  Atrial fibrillation (H[facility]) Rate controlled on atenolol.  Anticoagulated with Eliquis.  HLD (hyperlipidemia) Lipids at goal.  Continue Lipitor 80 mg daily and Zetia 10 mg daily.  Insomnia Discussed sleep hygiene measures.  He will try melatonin 10 mg nightly.  Discussed possible prescription medications, however patient declined.  Suspicious skin lesion Referral to derm placed.   Preventative Healthcare: PCV13 given today.  Up-to-date on other screenings and immunizations.  Discussed recent blood work with patient.  Patient Counseling(The following topics were reviewed and/or handout was given):  -Nutrition: Stressed importance of moderation in sodium/caffeine intake, saturated fat and cholesterol, caloric balance, sufficient intake of fresh fruits, vegetables, and fiber.  -Stressed the importance of regular exercise.   -Substance Abuse: Discussed cessation/primary prevention of tobacco, alcohol, or other drug use;  driving or other dangerous activities under the influence; availability of treatment for abuse.   -Injury prevention: Discussed safety belts, safety helmets, smoke detector, smoking near bedding or upholstery.   -Sexuality: Discussed sexually transmitted diseases, partner selection, use of condoms, avoidance of unintended pregnancy and contraceptive alternatives.   -Dental health: Discussed importance of regular tooth brushing, flossing, and dental visits.  -Health maintenance and immunizations reviewed. Please refer to Health maintenance section.  Return to care in 1 year for next preventative visit.   Algis Greenhouse. Jerline Pain, MD 02/19/2018 7:55 AM

## 2018-02-18 NOTE — Assessment & Plan Note (Signed)
At goal on atenolol.

## 2018-02-18 NOTE — Patient Instructions (Signed)
It was very nice to see you today!  Keep up the good work!  We will give you your pneumonia shot today.  Come back to see me in 1 year, or sooner as needed.  Take care, Dr Jerline Pain  Please try to incorporate the following into your daily routine:  1. Sleep only long enough to feel rested and then get out of bed  2. Go to bed and get up at the same time every day  3. Do not try to force yourself to sleep. If you can't sleep, get out of bed and try again later.  4. Have coffee, tea, and other foods that have caffeine only in the morning  5. Avoid alcohol in the late afternoon, evening, and bedtime  6. Avoid smoking, especially in the evening  7. Keep your bedroom dark, cool, quiet, and free of reminders of work or other things that cause you stress  8. Solve problems you have before you go to bed  9. Exercise several days a week, but not right before bed  10. Avoid looking at phones or reading devices ("e-books") that give off light before bed. This can make it harder to fall asleep.    Preventive Care 48 Years and Older, Male Preventive care refers to lifestyle choices and visits with your health care provider that can promote health and wellness. What does preventive care include?  A yearly physical exam. This is also called an annual well check.  Dental exams once or twice a year.  Routine eye exams. Ask your health care provider how often you should have your eyes checked.  Personal lifestyle choices, including: ? Daily care of your teeth and gums. ? Regular physical activity. ? Eating a healthy diet. ? Avoiding tobacco and drug use. ? Limiting alcohol use. ? Practicing safe sex. ? Taking low doses of aspirin every day. ? Taking vitamin and mineral supplements as recommended by your health care provider. What happens during an annual well check? The services and screenings done by your health care provider during your annual well check will depend on your age,  overall health, lifestyle risk factors, and family history of disease. Counseling Your health care provider may ask you questions about your:  Alcohol use.  Tobacco use.  Drug use.  Emotional well-being.  Home and relationship well-being.  Sexual activity.  Eating habits.  History of falls.  Memory and ability to understand (cognition).  Work and work Statistician.  Screening You may have the following tests or measurements:  Height, weight, and BMI.  Blood pressure.  Lipid and cholesterol levels. These may be checked every 5 years, or more frequently if you are over 87 years old.  Skin check.  Lung cancer screening. You may have this screening every year starting at age 50 if you have a 30-pack-year history of smoking and currently smoke or have quit within the past 15 years.  Fecal occult blood test (FOBT) of the stool. You may have this test every year starting at age 3.  Flexible sigmoidoscopy or colonoscopy. You may have a sigmoidoscopy every 5 years or a colonoscopy every 10 years starting at age 8.  Prostate cancer screening. Recommendations will vary depending on your family history and other risks.  Hepatitis C blood test.  Hepatitis B blood test.  Sexually transmitted disease (STD) testing.  Diabetes screening. This is done by checking your blood sugar (glucose) after you have not eaten for a while (fasting). You may have this done every  1-3 years.  Abdominal aortic aneurysm (AAA) screening. You may need this if you are a current or former smoker.  Osteoporosis. You may be screened starting at age 35 if you are at high risk.  Talk with your health care provider about your test results, treatment options, and if necessary, the need for more tests. Vaccines Your health care provider may recommend certain vaccines, such as:  Influenza vaccine. This is recommended every year.  Tetanus, diphtheria, and acellular pertussis (Tdap, Td) vaccine. You may  need a Td booster every 10 years.  Varicella vaccine. You may need this if you have not been vaccinated.  Zoster vaccine. You may need this after age 34.  Measles, mumps, and rubella (MMR) vaccine. You may need at least one dose of MMR if you were born in 1957 or later. You may also need a second dose.  Pneumococcal 13-valent conjugate (PCV13) vaccine. One dose is recommended after age 52.  Pneumococcal polysaccharide (PPSV23) vaccine. One dose is recommended after age 26.  Meningococcal vaccine. You may need this if you have certain conditions.  Hepatitis A vaccine. You may need this if you have certain conditions or if you travel or work in places where you may be exposed to hepatitis A.  Hepatitis B vaccine. You may need this if you have certain conditions or if you travel or work in places where you may be exposed to hepatitis B.  Haemophilus influenzae type b (Hib) vaccine. You may need this if you have certain risk factors.  Talk to your health care provider about which screenings and vaccines you need and how often you need them. This information is not intended to replace advice given to you by your health care provider. Make sure you discuss any questions you have with your health care provider. Document Released: 06/24/2015 Document Revised: 02/15/2016 Document Reviewed: 03/29/2015 Elsevier Interactive Patient Education  [employer].

## 2018-02-18 NOTE — Assessment & Plan Note (Signed)
Rate controlled on atenolol.  Anticoagulated with Eliquis.

## 2018-02-18 NOTE — Assessment & Plan Note (Signed)
Discussed sleep hygiene measures.  He will try melatonin 10 mg nightly.  Discussed possible prescription medications, however patient declined.

## 2018-02-18 NOTE — Assessment & Plan Note (Signed)
Lipids at goal.  Continue Lipitor 80 mg daily and Zetia 10 mg daily.

## 2018-02-19 NOTE — Addendum Note (Signed)
Addended by: Vivi Barrack on: 02/19/2018 07:55 AM   Modules accepted: Orders

## 2018-03-06 NOTE — Telephone Encounter (Signed)
Dr Ardis Hughs do you want to increase the linzess to 290 mcg?

## 2018-03-07 ENCOUNTER — Other Ambulatory Visit: Payer: Self-pay

## 2018-03-07 MED ORDER — LINACLOTIDE 290 MCG PO CAPS: 145.0000 ug | ORAL_CAPSULE | Freq: Every day | ORAL | 5 refills | Status: DC

## 2018-03-26 ENCOUNTER — Encounter: Payer: Self-pay | Admitting: Family Medicine

## 2018-03-27 MED ORDER — ZOLPIDEM TARTRATE 5 MG PO TABS: 5.0000 mg | ORAL_TABLET | Freq: Every evening | ORAL | 1 refills | Status: DC | PRN

## 2018-03-27 NOTE — Telephone Encounter (Signed)
Rx for [place] sent in.  Algis Greenhouse. Jerline Pain, MD 03/27/2018 11:13 AM

## 2018-04-15 ENCOUNTER — Ambulatory Visit: Payer: Medicare HMO | Admitting: Gastroenterology

## 2018-04-15 ENCOUNTER — Encounter: Payer: Self-pay | Admitting: Gastroenterology

## 2018-04-15 VITALS — BP 126/82 | HR 80 | Ht 72.0 in | Wt 212.0 lb

## 2018-04-15 DIAGNOSIS — K5900 Constipation, unspecified: Secondary | ICD-10-CM | POA: Diagnosis not present

## 2018-04-15 MED ORDER — LUBIPROSTONE 24 MCG PO CAPS: ORAL_CAPSULE | ORAL | 2 refills | Status: DC

## 2018-04-15 NOTE — Progress Notes (Signed)
[facility] Note  PCP: Vivi Barrack, [address] [place] [address]  Patient Profile: This is a 83 y.o. male with pmh significant for CAD, Colon Polyps, HTN, HLD, Afib, Chronic Constipation.  The patient is seen in follow up of:  Constipation, unspecified constipation type   HPI: Please see patient's initial consultation note with Dr. Ardis Hughs in August 2019 for full details of history of present illness.  In brief this is a patient was referred to my colleague Dr. Ardis Hughs for evaluation of constipation and gas.  He had a father who had colon cancer at an unclear age of diagnosis.  The patient has had constipation for many years as well as lots of gas.  He is taking Swiss Criss laxative on a daily basis.  His weight has been stable.  He never noted blood in his stool.  He has a report of prior colon polyps.  He had a Cologuard that was negative in May 2019.  It was felt that the patient's symptoms of constipation were chronic and lifelong and thus did not indicate need for further endoscopic evaluation at the time.  He was initiated on Linzess has been increased to a dose of 290 mcg daily.  Interval History: Today the patient comes in for scheduled follow up.  The patient is up to 290 mcg Linzess and using Swiss Criss on the 4th day.  He has not noticed significant improvement while on the Linzess.  Last Colonoscopy was in 2002 he would like to hold on repeat colonoscopy if possible.  The patient does not drink lots of water only 24-30 ounces.  There is no hematochezia or melena.  He is describing issues of bloating sensation as he gets closer to needing to have a bowel movement but is not having significant abdominal pains.  GI ROS: Positive as above Negative for pyrosis, dysphagia, odynophagia, jaundice, melena, hematochezia  ROS: General: Denies fevers/chills/weight loss HEENT: Denies oral lesions Cardiovascular: Denies chest pain Pulmonary: Denies shortness of  breath Gastroenterological: See HPI Genitourinary: Denies darkened urine Hematological: Denies easy bruising/bleeding Psychological: Mood is stable   Past Medical History:  Diagnosis Date  . CAD (coronary artery disease)    a. BMS-RCA, BMS-Cx, PTCA of OM1 in 2001. b. overlapping DES in RCA and PTCA to small distal Cx 2013. c. Cath 2016 -> stable CAD, suspect dyspnea unrelated to coronary disease.  Marland Kitchen COLONIC POLYPS, HX OF    Dr Earlean Shawl  . DJD (degenerative joint disease)   . Essential hypertension   . Hyperlipidemia   . Myocardial infarction ([place]) 08/1999  . Persistent atrial fibrillation    a. dx 2016.  Marland Kitchen RBBB   . TIA (transient ischemic attack)     Past Surgical History:  Procedure Laterality Date  . CARDIAC CATHETERIZATION  ~ 2002  . CARDIAC CATHETERIZATION N/A 03/02/2015   Procedure: Left Heart Cath and Coronary Angiography;  Surgeon: Sherren Mocha, MD;  Location: [facility];  Service: Cardiovascular;  Laterality: N/A;  . CATARACT EXTRACTION W/ INTRAOCULAR LENS  IMPLANT, BILATERAL  ~ 2007   bilaterally  . CORONARY ANGIOPLASTY  07/30/11  . CORONARY ANGIOPLASTY WITH STENT PLACEMENT  2001; 07/30/11;   "2+3"  . INGUINAL HERNIA REPAIR  2005   left  . LEFT HEART CATHETERIZATION WITH CORONARY ANGIOGRAM N/A 07/30/2011   Procedure: LEFT HEART CATHETERIZATION WITH CORONARY ANGIOGRAM;  Surgeon: Burnell Blanks, MD;  Location: [facility] CATH LAB;  Service: Cardiovascular;  Laterality: N/A;  . PERCUTANEOUS  CORONARY STENT INTERVENTION (PCI-S)  07/30/2011   Procedure: PERCUTANEOUS CORONARY STENT INTERVENTION (PCI-S);  Surgeon: Burnell Blanks, MD;  Location: [facility] CATH LAB;  Service: Cardiovascular;;  . RETINAL DETACHMENT SURGERY  05/2008; 06/2008; 07/2008   left; "all 3 surgeries have failed"    Current Outpatient Medications  Medication Sig Dispense Refill  . atenolol (TENORMIN) 25 MG tablet Take 1 tablet (25 mg total) by mouth daily. 180 tablet 3  . atorvastatin (LIPITOR) 80  MG tablet TAKE 1 TABLET ONCE DAILY. 90 tablet 3  . ELIQUIS 5 MG TABS tablet TAKE 1 TABLET BY MOUTH TWICE DAILY. 60 tablet 5  . ezetimibe (ZETIA) 10 MG tablet TAKE (1) TABLET DAILY AT BEDTIME. 90 tablet 3  . folic acid (FOLVITE) 1 MG tablet Take by mouth.    . Glucosamine HCl 1000 MG TABS Take 1 tablet by mouth daily.     Marland Kitchen linaclotide (LINZESS) 290 MCG CAPS capsule Take 1 capsule (290 mcg total) by mouth daily. 30 capsule 5  . lubiprostone (AMITIZA) 24 MCG capsule Take daily for first 2-3 weeks if no change then increase to 2 tabs daily 60 capsule 2  . MELATONIN PO Take 1 tablet by mouth as needed.     . Misc Natural Products (BLACK CHERRY CONCENTRATE PO) Take by mouth.    Marland Kitchen NITROSTAT 0.4 MG SL tablet DISSOLVE 1 TABLET UNDER TONGUE AS NEEDED FOR CHEST PAIN,MAY REPEAT IN5 MINUTES FOR 2 DOSES. 25 tablet 3  . NON FORMULARY     . Potassium Aminobenzoate (POTABA) 500 MG TABS Take 1 tablet by mouth daily.    . Saw Palmetto 500 MG CAPS Take by mouth.    . vitamin B-12 (CYANOCOBALAMIN) 1000 MCG tablet Take 1,000 mcg by mouth daily.    . vitamin E 1000 UNIT capsule Take 2,000 Units by mouth daily.    Marland Kitchen zolpidem (AMBIEN) 5 MG tablet Take 1 tablet (5 mg total) by mouth at bedtime as needed for sleep. 15 tablet 1   No current facility-administered medications for this visit.     Allergies as of 04/15/2018  . (No Known Allergies)    Family History  Problem Relation Age of Onset  . Colon cancer Father        in 29s  . Prostate cancer Father   . Alcohol abuse Mother     Social History   Socioeconomic History  . Marital status: Married    Spouse name: Not on file  . Number of children: Not on file  . Years of education: Not on file  . Highest education level: Not on file  Occupational History  . Not on file  Social Needs  . Financial resource strain: Not on file  . Food insecurity:    Worry: Not on file    Inability: Not on file  . Transportation needs:    Medical: Not on file     Non-medical: Not on file  Tobacco Use  . Smoking status: Former Smoker    Packs/day: 0.50    Years: 40.00    Pack years: 20.00    Types: Cigarettes    Last attempt to quit: 06/12/1999    Years since quitting: 18.8  . Smokeless tobacco: Never Used  Substance and Sexual Activity  . 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 Drug use: No  . Sexual activity: Not Currently  Lifestyle  . Physical activity:    Days per week: Not on file  Minutes per session: Not on file  . Stress: Not on file  Relationships  . Social connections:    Talks on phone: Not on file    Gets together: Not on file    Attends religious service: Not on file    Active member of club or organization: Not on file    Attends meetings of clubs or organizations: Not on file    Relationship status: Not on file  . Intimate partner violence:    Fear of current or ex partner: Not on file    Emotionally abused: Not on file    Physically abused: Not on file    Forced sexual activity: Not on file  Other Topics Concern  . Not on file  Social History Narrative  . Not on file    Physical Exam: BP 126/82   Pulse 80   Ht 6' (1.829 m)   Wt 212 lb (96.2 kg)   SpO2 96%   BMI 28.75 kg/m  GEN: NAD, appears stated age, doesn't appear chronically ill PSYCH: Cooperative, without pressured speech EYE: Conjunctivae pink, sclerae anicteric ENT: MMM NECK: Supple CV: RR without R/Gs  RESP: CTAB posteriorly, without wheezing GI: NABS, soft, NT/ND, without rebound or guarding, no HSM appreciated MSK/EXT: No lower extremity edema SKIN: No jaundice NEURO:  Alert & Oriented x 3, no focal deficits  Data Review: No new relevant data to review   Assessment: This is a 83 y.o. male with pmh significant for CAD, Colon Polyps, HTN, HLD, Afib, Chronic Constipation.  The patient presents for follow up of:  Constipation, unspecified constipation type  The patient's clinical presentation is 1  of likely chronic idiopathic constipation.  A trial of Linzess up to 290 daily without significant effect.  He still using the Swiss Gerald Stabs that he discussed with Dr. Ardis Hughs in the past.  I will plan to transition him from [place] to [place].  We did discuss the possibility of using true [place] however his mechanism being similar to Linzess we discussed to trial a different medication.  He can start a 24 mcg daily and increase to 48 mcg daily if necessary after 1 to 2 weeks if he is not having significant change in his bowel habits.  I think the major issue with why his agents are not working that he barely drinks any water on a daily basis.  He describes drinking maybe 20 ounces per day and thus I think this is subsequently leading to problems with the patient being able to have adequate bowel movements.  We did discuss the possibility of a repeat colonoscopy although this is been deferred by Dr. Ardis Hughs.  At this point in time we will trial this and if this does not work then may consider a medication such as Trulance or trying a GoLYTELY bowel preparation to optimize his movements and subsequently then we placed him back on promotility agents.  He will follow-up with Dr. Ardis Hughs in the course of the following weeks.  All patient questions were answered, to the best of my ability, and the patient agrees to the aforementioned plan of action with follow-up as indicated.   Plan: 1. Constipation, unspecified constipation type - Stop Linzess - Start Amitiza 24 mcg QD and increase to 48 mcg QD if no change in bowel habits in 2-weeks - Significantly increase Water consumption to 48-60 ounces daily - Fibercon can be considered and the patient will think on this - Hold on repeat endoscopic evaluation for now - Follow up  with Dr. Ardis Hughs to be scheduled   Justice Britain, MD [facility] Gastroenterology Advanced Endoscopy Office # [phone]

## 2018-04-15 NOTE — Patient Instructions (Signed)
Stop Linzess  We have sent the following medications to your pharmacy for you to pick up at your convenience:Lubiprostone  Please follow up with Dr Ardis Hughs for future appointments   Drink 60-80 oz of water daily to prevent constipation  Thank you for entrusting me with your care and choosing [facility] care.  Dr Rush Landmark

## 2018-05-31 NOTE — Progress Notes (Signed)
Cardiology Office Note    Date:  06/02/2018  ID:  HAKIEM MALIZIA, DOB [birth year], MRN [id number] PCP:  Vivi Barrack, MD  Cardiologist:  Lauree Chandler, MD   Chief Complaint: routine f/u afib, CAD  History of Present Illness:  Luis [name] is a 83 y.o. male with history of CAD (BMS-RCA, BMS-Cx, PTCA of OM1 in 2001, overlapping DES in RCA and PTCA to small distal Cx 2013), TIA, mild carotid disease (1-39% 10/2017), HTN, HLD, RBBB and persistent atrial fibrillation here today for routine cardiac follow-up.   His cardiac history dates back to 2001 when he had a bare-metal stent placed in the right coronary artery and a bare metal stent placed in the Circumflex artery as well as balloon angioplasty of the ostium of the first obtuse marginal coronary artery at that time. Cardiac cath February 2013 with placement of two non-overlapping drug eluting stents in the RCA and severe disease in the small distal Circumflex treated with balloon angioplasty only. He was admitted to [facility] June 2016 with TIA type symptoms and was found to be in atrial fibrillation. He was started on Eliquis. 48 Hour monitor June 2016 with persistent atrial fibrillation, rare PVCs. Echo June 2016 showed normal LV size and function, no valve issues. He was seen in the office 02/2015 with worsening dyspnea and cath showed stable CAD with continued patency of the stented segments in the RCA, mild stenosis of the LCx stent, mild diffuse nonobstructive stenosis of the LAD, and severe distal stenosis of small OM subranches, normal LV function. It was suspected dyspnea was unrelated to coronary disease. Give lack of symptoms, his AF has been managed with rate control. When I saw him in 11/2017, he was complaining of some gait instability and dyspnea on exertion dating back to 2016. Atenolol was decreased to once daily. Labs were unremarkable. Carotid duplex was stable with 1-39% stenosis 10/2017. Upon f/u with Ermalinda Barrios 11/2017, he  was felt to be stable from a cardiac standpoint and was asked to f/u with ophthamology (lack of peripheral vision due to prior retinal issue). His last labs 02/2018 showed LDL 61, CBC wnl, TSH wnl, Cr 1.10, normal LFTs, K 4.6.   He returns for follow-up alone, reading the paper. He has no cardiac complaints. No CP, SOB, palpitations, dizziness, orthopnea, PND, bleeding or stroke sx. He does report some nausea after increasing Amitiza per GI. UpToDate cites up to a 29% incidence of nausea with this medicine. His other cardiac meds have not changed.    Past Medical History:  Diagnosis Date  . CAD (coronary artery disease)    a. BMS-RCA, BMS-Cx, PTCA of OM1 in 2001. b. overlapping DES in RCA and PTCA to small distal Cx 2013. c. Cath 2016 -> stable CAD, suspect dyspnea unrelated to coronary disease.  Marland Kitchen COLONIC POLYPS, HX OF    Dr Earlean Shawl  . DJD (degenerative joint disease)   . Essential hypertension   . Hyperlipidemia   . Myocardial infarction ([place]) 08/1999  . Persistent atrial fibrillation    a. dx 2016.  Marland Kitchen RBBB   . TIA (transient ischemic attack)     Past Surgical History:  Procedure Laterality Date  . CARDIAC CATHETERIZATION  ~ 2002  . CARDIAC CATHETERIZATION N/A 03/02/2015   Procedure: Left Heart Cath and Coronary Angiography;  Surgeon: Sherren Mocha, MD;  Location: [facility];  Service: Cardiovascular;  Laterality: N/A;  . CATARACT EXTRACTION W/ INTRAOCULAR LENS  IMPLANT, BILATERAL  ~ 2007  bilaterally  . CORONARY ANGIOPLASTY  07/30/11  . CORONARY ANGIOPLASTY WITH STENT PLACEMENT  2001; 07/30/11;   "2+3"  . INGUINAL HERNIA REPAIR  2005   left  . LEFT HEART CATHETERIZATION WITH CORONARY ANGIOGRAM N/A 07/30/2011   Procedure: LEFT HEART CATHETERIZATION WITH CORONARY ANGIOGRAM;  Surgeon: Burnell Blanks, MD;  Location: [facility] CATH LAB;  Service: Cardiovascular;  Laterality: N/A;  . PERCUTANEOUS CORONARY STENT INTERVENTION (PCI-S)  07/30/2011   Procedure: PERCUTANEOUS CORONARY  STENT INTERVENTION (PCI-S);  Surgeon: Burnell Blanks, MD;  Location[facility] C[facility] LAB;  Service: Cardiovascular;;  . RETINAL DETACHMENT SURGERY  05/2008; 06/2008; 07/2008   left; "all 3 surgeries have failed"    Current Medications: Current Meds  Medication Sig  . atorvastatin (LIPITOR) 80 MG tablet TAKE 1 TABLET ONCE DAILY.  Marland Kitchen ELIQUIS 5 MG TABS tablet TAKE 1 TABLET BY MOUTH TWICE DAILY.  Marland Kitchen ezetimibe (ZETIA) 10 MG tablet TAKE (1) TABLET DAILY AT BEDTIME.  . folic acid (FOLVITE) 1 MG tablet Take by mouth.  . Glucosamine HCl 1000 MG TABS Take 1 tablet by mouth daily.   Marland Kitchen lubiprostone (AMITIZA) 24 MCG capsule Take daily for first 2-3 weeks if no change then increase to 2 tabs daily (Patient taking differently: Take 48 mcg by mouth daily. Take daily for first 2-3 weeks if no change then increase to 2 tabs daily)  . MELATONIN PO Take 1 tablet by mouth as needed.   . Misc Natural Products (BLACK CHERRY CONCENTRATE PO) Take by mouth.  Marland Kitchen NITROSTAT 0.4 MG SL tablet DISSOLVE 1 TABLET UNDER TONGUE AS NEEDED FOR CHEST PAIN,MAY REPEAT IN5 MINUTES FOR 2 DOSES.  . NON FORMULARY   . Potassium Aminobenzoate (POTABA) 500 MG TABS Take 1 tablet by mouth daily.  . Saw Palmetto 500 MG CAPS Take by mouth.  . vitamin B-12 (CYANOCOBALAMIN) 1000 MCG tablet Take 1,000 mcg by mouth daily.  . vitamin E 1000 UNIT capsule Take 2,000 Units by mouth daily.  Marland Kitchen zolpidem (AMBIEN) 5 MG tablet Take 1 tablet (5 mg total) by mouth at bedtime as needed for sleep.      Allergies:   Patient has no known allergies.   Social History   Socioeconomic History  . Marital status: Married    Spouse name: Not on file  . Number of children: Not on file  . Years of education: Not on file  . Highest education level: Not on file  Occupational History  . Not on file  Social Needs  . Financial resource strain: Not on file  . Food insecurity:    Worry: Not on file    Inability: Not on file  . Transportation needs:    Medical: Not  on file    Non-medical: Not on file  Tobacco Use  . Smoking status: Former Smoker    Packs/day: 0.50    Years: 40.00    Pack years: 20.00    Types: Cigarettes    Last attempt to quit: 06/12/1999    Years since quitting: 18.9  . Smokeless tobacco: Never Used  Substance and Sexual Activity  . Alcohol use: No    Comment: 07/30/11 "recovering alcoholic; since 11/11/2295"  . Drug use: No  . Sexual activity: Not Currently  Lifestyle  . Physical activity:    Days per week: Not on file    Minutes per session: Not on file  . Stress: Not on file  Relationships  . Social connections:    Talks on phone: Not on file  Gets together: Not on file    Attends religious service: Not on file    Active member of club or organization: Not on file    Attends meetings of clubs or organizations: Not on file    Relationship status: Not on file  Other Topics Concern  . Not on file  Social History Narrative  . Not on file     Family History:  The patient's family history includes Alcohol abuse in his mother; Colon cancer in his father; Prostate cancer in his father.  ROS:   Please see the history of present illness.  All other systems are reviewed and otherwise negative.    PHYSICAL EXAM:   VS:  BP 118/74   Pulse (!) 55   Ht 6' (1.829 m)   Wt 223 lb 6.4 oz (101.3 kg)   SpO2 98%   BMI 30.30 kg/m   BMI: Body mass index is 30.3 kg/m. GEN: Well nourished, well developed WM, in no acute distress HEENT: normocephalic, atraumatic Neck: no JVD, carotid bruits, or masses Cardiac: irregularly irregular, rate controlled (50s-60s); no murmurs, rubs, or gallops, no edema  Respiratory:  clear to auscultation bilaterally, normal work of breathing GI: soft, nontender, nondistended, + BS MS: no deformity or atrophy Skin: warm and dry, no rash Neuro:  Alert and Oriented x 3, Strength and sensation are intact, follows commands Psych: euthymic mood, full affect  Wt Readings from Last 3 Encounters:    06/02/18 223 lb 6.4 oz (101.3 kg)  04/15/18 212 lb (96.2 kg)  02/18/18 206 lb 6.4 oz (93.6 kg)      Studies/Labs Reviewed:   EKG:  EKG was ordered today and personally reviewed by me and demonstrates atrial fib 56bpm, RBBB, prior inferior infarct, no acute ST-T changes  Recent Labs: 11/06/2017: Magnesium 2.0 02/13/2018: ALT 30; BUN 17; Creatinine, Ser 1.10; Hemoglobin 15.4; Platelets 194.0; Potassium 4.6; Sodium 141; TSH 2.40   Lipid Panel    Component Value Date/Time   CHOL 133 02/13/2018 0749   CHOL 126 05/15/2017 0807   TRIG 71.0 02/13/2018 0749   TRIG 80 05/07/2006 1036   HDL 58.00 02/13/2018 0749   HDL 52 05/15/2017 0807   CHOLHDL 2 02/13/2018 0749   VLDL 14.2 02/13/2018 0749   LDLCALC 61 02/13/2018 0749   LDLCALC 61 05/15/2017 0807    Additional studies/ records that were reviewed today include: Summarized above   ASSESSMENT & PLAN:   1. CAD - no recent angina. He is not on ASA due to concomitant Eliquis. Continue BB and anti-lipid regimen.  2. Persistent atrial fibrillation - rate controlled. Tolerating current regimen without issue. Feeling better on lower dose of atenolol. Continue to follow basic labs every 6 months given anticoagulation - next CBC/BMET due in 08/2018, will arrange. He takes saw palmetto as a supplement. This can increase risk of bleeding while on anticoagulation. He's not seen any specific clinically necessary benefit with this supplement so I asked him to stop the saw palmetto. 3. Essential HTN - excellent control, continue. 4. Hyperlipidemia - excellent control by lipids in 02/2018. He follows yearly with PCP for this. 5. Mild carotid disease - mild by duplex 10/2017. Follow clinically going forward.  Disposition: F/u with Dr .Angelena Form in 6 months. I advised he discuss his nausea with GI given that cardiac meds have not changed, but this seemed to arise after increasing Amitiza.  Medication Adjustments/Labs and Tests Ordered: Current medicines are  reviewed at length with the patient today.  Concerns regarding  medicines are outlined above. Medication changes, Labs and Tests ordered today are summarized above and listed in the Patient Instructions accessible in Encounters.   Signed, Charlie Pitter, PA-C  06/02/2018 3:25 PM    [facility] [place], [place], [place]  [address] Phone: [phone]; Fax: [phone]

## 2018-06-02 ENCOUNTER — Encounter: Payer: Self-pay | Admitting: Physician Assistant

## 2018-06-02 ENCOUNTER — Ambulatory Visit: Payer: Medicare HMO | Admitting: Physician Assistant

## 2018-06-02 VITALS — BP 118/74 | HR 55 | Ht 72.0 in | Wt 223.4 lb

## 2018-06-02 DIAGNOSIS — I4819 Other persistent atrial fibrillation: Secondary | ICD-10-CM | POA: Diagnosis not present

## 2018-06-02 DIAGNOSIS — E785 Hyperlipidemia, unspecified: Secondary | ICD-10-CM | POA: Diagnosis not present

## 2018-06-02 DIAGNOSIS — I10 Essential (primary) hypertension: Secondary | ICD-10-CM

## 2018-06-02 DIAGNOSIS — I779 Disorder of arteries and arterioles, unspecified: Secondary | ICD-10-CM

## 2018-06-02 DIAGNOSIS — I2510 Atherosclerotic heart disease of native coronary artery without angina pectoris: Secondary | ICD-10-CM

## 2018-06-02 DIAGNOSIS — I739 Peripheral vascular disease, unspecified: Secondary | ICD-10-CM | POA: Diagnosis not present

## 2018-06-02 NOTE — Patient Instructions (Signed)
Medication Instructions:  Your physician has recommended you make the following change in your medication:  1.  STOP Saw Palmetto  If you need a refill on your cardiac medications before your next appointment, please call your pharmacy.   Lab work: August 12, 2018:  CBC  If you have labs (blood work) drawn today and your tests are completely normal, you will receive your results only by: Marland Kitchen MyChart Message (if you have MyChart) OR . A paper copy in the mail If you have any lab test that is abnormal or we need to change your treatment, we will call you to review the results.  Testing/Procedures: None ordered  Follow-Up: At [facility], you and your health needs are our priority.  As part of our continuing mission to provide you with exceptional heart care, we have created designated Provider Care Teams.  These Care Teams include your primary Cardiologist (physician) and Advanced Practice Providers (APPs -  Physician Assistants and Nurse Practitioners) who all work together to provide you with the care you need, when you need it. You will need a follow up appointment in 6 months.  Please call our office 2 months in advance to schedule this appointment.  You may see Lauree Chandler, MD or one of the following Advanced Practice Providers on your designated Care Team:   [place], PA-C Melina Copa, PA-C . Ermalinda Barrios, PA-C  Any Other Special Instructions Will Be Listed Below (If Applicable).

## 2018-07-15 ENCOUNTER — Other Ambulatory Visit: Payer: Self-pay | Admitting: Cardiovascular Disease

## 2018-07-23 ENCOUNTER — Other Ambulatory Visit: Payer: Self-pay | Admitting: Cardiovascular Disease

## 2018-07-23 NOTE — Telephone Encounter (Signed)
Eliquis 5mg  refill request received; pt is 84 yrs old, wt-101.3kg, Crea-1.10 on 02/13/2018, last seen by Melina Copa on 06/02/2018; will send in refill to requested pharmacy.

## 2018-08-09 DIAGNOSIS — R05 Cough: Secondary | ICD-10-CM | POA: Diagnosis not present

## 2018-08-09 DIAGNOSIS — R509 Fever, unspecified: Secondary | ICD-10-CM | POA: Diagnosis not present

## 2018-08-09 DIAGNOSIS — R5383 Other fatigue: Secondary | ICD-10-CM | POA: Diagnosis not present

## 2018-08-09 DIAGNOSIS — R5381 Other malaise: Secondary | ICD-10-CM | POA: Diagnosis not present

## 2018-08-09 DIAGNOSIS — J101 Influenza due to other identified influenza virus with other respiratory manifestations: Secondary | ICD-10-CM | POA: Diagnosis not present

## 2018-08-09 DIAGNOSIS — I700 Atherosclerosis of aorta: Secondary | ICD-10-CM | POA: Diagnosis not present

## 2018-08-09 DIAGNOSIS — I77810 Thoracic aortic ectasia: Secondary | ICD-10-CM | POA: Diagnosis not present

## 2018-08-12 ENCOUNTER — Other Ambulatory Visit: Payer: Medicare HMO

## 2018-08-12 DIAGNOSIS — I10 Essential (primary) hypertension: Secondary | ICD-10-CM | POA: Diagnosis not present

## 2018-08-12 DIAGNOSIS — I2510 Atherosclerotic heart disease of native coronary artery without angina pectoris: Secondary | ICD-10-CM

## 2018-08-12 DIAGNOSIS — E785 Hyperlipidemia, unspecified: Secondary | ICD-10-CM

## 2018-08-12 DIAGNOSIS — I4819 Other persistent atrial fibrillation: Secondary | ICD-10-CM | POA: Diagnosis not present

## 2018-08-12 DIAGNOSIS — I779 Disorder of arteries and arterioles, unspecified: Secondary | ICD-10-CM

## 2018-08-12 DIAGNOSIS — I739 Peripheral vascular disease, unspecified: Secondary | ICD-10-CM | POA: Diagnosis not present

## 2018-08-12 LAB — CBC
Hematocrit: 47.2 % (ref 37.5–51.0)
Hemoglobin: 15.5 g/dL (ref 13.0–17.7)
MCH: 30.2 pg (ref 26.6–33.0)
MCHC: 32.8 g/dL (ref 31.5–35.7)
MCV: 92 fL (ref 79–97)
Platelets: 180 10*3/uL (ref 150–450)
RBC: 5.13 x10E6/uL (ref 4.14–5.80)
RDW: 12.9 % (ref 11.6–15.4)
WBC: 4.1 10*3/uL (ref 3.4–10.8)

## 2018-08-13 ENCOUNTER — Telehealth: Payer: Self-pay | Admitting: *Deleted

## 2018-08-13 DIAGNOSIS — Z79899 Other long term (current) drug therapy: Secondary | ICD-10-CM

## 2018-08-13 NOTE — Telephone Encounter (Signed)
-----   Message from Charlie Pitter, [place] sent at 08/13/2018  7:40 AM EST ----- Please let pt know CBC was normal - should also have had BMET with this, can we add? Thanks! Dayna Dunn PA-C

## 2018-10-07 ENCOUNTER — Telehealth: Payer: Self-pay

## 2018-10-07 NOTE — Telephone Encounter (Signed)
I called the patient because he sent a My Chart message pertaining to elevated Bps over the past 2 weeks.  They have been running from the 180-190s/ 80s.  HR has been 60s. Asymptomatic.   He had an office visit with Dayna on 06/02/18 and she recommended 6 month f/u, which was never submitted.  He mentioned that he is taking Atenolol 25 mg daily, but that was reduced a while back from 50 mg daily due to hypotension.  Please advise, thank you.

## 2018-10-08 NOTE — Telephone Encounter (Signed)
Please see my MyChart message back to the patient.

## 2018-10-30 ENCOUNTER — Telehealth: Payer: Self-pay | Admitting: Cardiovascular Disease

## 2018-10-30 NOTE — Telephone Encounter (Signed)
   Primary Cardiologist: Lauree Chandler, MD  Chart reviewed as part of pre-operative protocol coverage. Simple dental extractions are considered low risk procedures per guidelines and generally do not require any specific cardiac clearance. It is also generally accepted that for simple extractions and dental cleanings, there is no need to interrupt blood thinner therapy.   SBE prophylaxis is not required for the patient.  I will route this recommendation to the requesting party via Epic fax function and remove from pre-op pool.  Please call with questions.  Abigail Butts, PA-C 10/30/2018, 12:51 PM

## 2018-10-30 NOTE — Telephone Encounter (Signed)
New Message             [facility] Pre-operative Risk Assessment    Request for surgical clearance:  1. What type of surgery is being performed? Tooth extraction  2. When is this surgery scheduled? 11/06/2018  3. What type of clearance is required (medical clearance vs. Pharmacy clearance to hold med vs. Both)? Both  4. Are there any medications that need to be held prior to surgery and how long? Eliquis   5. Practice name and name of physician performing surgery? [facility]/Dr Gennaro Africa   6. What is your office phone number [phone]   7.   What is your office fax number [phone]  8.   Anesthesia type (None, local, MAC, general) ? Local   Luis [name] 10/30/2018, 12:11 PM  _________________________________________________________________   (provider comments below)

## 2018-10-30 NOTE — Telephone Encounter (Signed)
Patient with diagnosis of afib on Eliquis for anticoagulation.    Procedure: 5 teeth extraction Date of procedure:11/12/2018  CHADS2-VASc score of  6 (CHF, HTN, AGE, DM2, stroke/tia x 2, CAD, AGE, male)  CrCl 58ml/min  Per office protocol, patient can hold Eliquis for 1 day prior to procedure.

## 2018-10-30 NOTE — Telephone Encounter (Signed)
   Primary Cardiologist: Lauree Chandler, MD  Chart reviewed as part of pre-operative protocol coverage. Simple dental extractions are considered low risk procedures per guidelines and generally do not require any specific cardiac clearance. It is also generally accepted that for simple extractions and dental cleanings, there is no need to interrupt blood thinner therapy. Given that 5 teeth will be extracted, patient can hold eliquis 1 day prior to his procedure per pharmacy recommendations.   SBE prophylaxis is not required for the patient.  I will route this recommendation to the requesting party via Epic fax function and remove from pre-op pool.  Please call with questions.  Abigail Butts, PA-C 10/30/2018, 5:20 PM

## 2018-10-30 NOTE — Telephone Encounter (Signed)
New message    1. What dental office are you calling from? Heloise Ochoa DDS  2. What is your office phone number? [phone]  3. What is your fax number? [phone]  4. What type of procedure is the patient having performed?  Patient is having 5 teeth extracted   5. What date is procedure scheduled or is the patient there now? 11/12/2018 (if the patient is at the dentist's office question goes to their cardiologist if he/she is in the office.  If not, question should go to the DOD).   6. What is your question (ex. Antibiotics prior to procedure, holding medication-we need to know how long dentist wants pt to hold med)? Does the eliquis need to be stopped? Is it okay for the patient have  Nitrios Oxide? Does the patient need to bring their Nitriostat?

## 2018-10-30 NOTE — Telephone Encounter (Signed)
We recommend against holding anticoagulation for extraction of one tooth.

## 2018-11-05 NOTE — Telephone Encounter (Signed)
Follow up:   Please fax over medical clearance to Dr. Luretha Rued office at [phone] or e-mail [email or web address] .

## 2018-11-05 NOTE — Telephone Encounter (Signed)
Letter faxed to number provided for Dr. Luretha Rued.

## 2018-11-26 ENCOUNTER — Other Ambulatory Visit: Payer: Self-pay | Admitting: Cardiovascular Disease

## 2018-12-04 ENCOUNTER — Other Ambulatory Visit: Payer: Self-pay | Admitting: Cardiovascular Disease

## 2018-12-11 ENCOUNTER — Other Ambulatory Visit: Payer: Self-pay | Admitting: Physician Assistant

## 2018-12-23 ENCOUNTER — Telehealth: Payer: Self-pay | Admitting: *Deleted

## 2018-12-23 NOTE — Telephone Encounter (Signed)
   [facility] Pre-operative Risk Assessment    Request for surgical clearance:  1. What type of surgery is being performed? PLACEMENT OF SIX DENTAL IMPLANTS  2. When is this surgery scheduled? TBD  3. What type of clearance is required (medical clearance vs. Pharmacy clearance to hold med vs. Both)? BOTH  4. Are there any medications that need to be held prior to surgery and how long? ELIQUIS 2-3 DAYS PRIOR  5. Practice name and name of physician performing surgery? [facility]; CHRISTOPHER Darfur, DDS OR [place]ney, DMD; NOT LISTED WHO IS DOING THE SURGERY  6. What is your office phone number [phone]   7.   What is your office fax number [phone]  8.   Anesthesia type (None, local, MAC, general) ? IV ANESTHESIA, VERSED, FENTANYL and PROPOFOL    Luis [name] 12/23/2018, 4:22 PM  _________________________________________________________________   (provider comments below)

## 2018-12-24 NOTE — Telephone Encounter (Signed)
Patient with diagnosis of afib on Eliquis for anticoagulation.    Procedure: PLACEMENT OF SIX DENTAL IMPLANTS Date of procedure: TBD  CHADS2-VASc score of  6 ( HTN, AGE, stroke/tia x 2, CAD, AGE)  CrCl 64 ml/min  Patient is at increase risk off anticoagulation due to history of a TIA. Oral surgeon is requesting patient hold Eliquis 2-3 days. I will route to Dr. Angelena Form for his input.

## 2018-12-24 NOTE — Telephone Encounter (Signed)
I agree with holding the day before and day of procedure.

## 2018-12-24 NOTE — Telephone Encounter (Signed)
   Primary Cardiologist: Lauree Chandler, MD  Chart reviewed as part of pre-operative protocol coverage. Patient was contacted 12/24/2018 in reference to pre-operative risk assessment for pending surgery as outlined below.  IVEY CINA was last seen on 06/02/18 by Melina Copa, PA-C for Dr. Angelena Form for CAD, HTN, HLD and RBBB.  Since that day, HUTCHINSON ISENBERG has done well with no chest pain or SOB and able to continue with 4 METS of activity..  Therefore, based on ACC/AHA guidelines, the patient would be at acceptable risk for the planned procedure without further cardiovascular testing.   This is low risk procedure and stable to hold Eliquis the day before the procedure and day of the procedure.  Resume ASAP post procedure.     I will route this recommendation to the requesting party via Epic fax function and remove from pre-op pool.  Please call with questions.  Cecilie Kicks, NP 12/24/2018, 12:31 PM

## 2018-12-24 NOTE — Telephone Encounter (Signed)
On last dental work pt held eliquis the day before procedure and day of procedure.  So will plan on same. Dr. Angelena Form is out of the office this week.

## 2018-12-24 NOTE — Telephone Encounter (Signed)
Pharm please address eliquis thanks 

## 2019-02-07 ENCOUNTER — Emergency Department (HOSPITAL_BASED_OUTPATIENT_CLINIC_OR_DEPARTMENT_OTHER): Payer: Medicare HMO

## 2019-02-07 ENCOUNTER — Emergency Department (HOSPITAL_BASED_OUTPATIENT_CLINIC_OR_DEPARTMENT_OTHER)
Admission: EM | Admit: 2019-02-07 | Discharge: 2019-02-07 | Disposition: A | Discharge status: Home / Self Care | Payer: Medicare HMO | Attending: Emergency Medicine | Admitting: Emergency Medicine

## 2019-02-07 ENCOUNTER — Other Ambulatory Visit: Payer: Self-pay

## 2019-02-07 ENCOUNTER — Encounter (HOSPITAL_BASED_OUTPATIENT_CLINIC_OR_DEPARTMENT_OTHER): Payer: Self-pay | Admitting: Emergency Medicine

## 2019-02-07 DIAGNOSIS — Z7901 Long term (current) use of anticoagulants: Secondary | ICD-10-CM | POA: Diagnosis not present

## 2019-02-07 DIAGNOSIS — Z79899 Other long term (current) drug therapy: Secondary | ICD-10-CM | POA: Diagnosis not present

## 2019-02-07 DIAGNOSIS — I252 Old myocardial infarction: Secondary | ICD-10-CM | POA: Insufficient documentation

## 2019-02-07 DIAGNOSIS — R51 Headache: Secondary | ICD-10-CM | POA: Insufficient documentation

## 2019-02-07 DIAGNOSIS — Z23 Encounter for immunization: Secondary | ICD-10-CM | POA: Insufficient documentation

## 2019-02-07 DIAGNOSIS — I10 Essential (primary) hypertension: Secondary | ICD-10-CM | POA: Insufficient documentation

## 2019-02-07 DIAGNOSIS — W19XXXA Unspecified fall, initial encounter: Secondary | ICD-10-CM

## 2019-02-07 DIAGNOSIS — Y999 Unspecified external cause status: Secondary | ICD-10-CM | POA: Diagnosis not present

## 2019-02-07 DIAGNOSIS — Z87891 Personal history of nicotine dependence: Secondary | ICD-10-CM | POA: Insufficient documentation

## 2019-02-07 DIAGNOSIS — S60222A Contusion of left hand, initial encounter: Secondary | ICD-10-CM | POA: Insufficient documentation

## 2019-02-07 DIAGNOSIS — S0990XA Unspecified injury of head, initial encounter: Secondary | ICD-10-CM

## 2019-02-07 DIAGNOSIS — W010XXA Fall on same level from slipping, tripping and stumbling without subsequent striking against object, initial encounter: Secondary | ICD-10-CM | POA: Insufficient documentation

## 2019-02-07 DIAGNOSIS — Z8673 Personal history of transient ischemic attack (TIA), and cerebral infarction without residual deficits: Secondary | ICD-10-CM | POA: Insufficient documentation

## 2019-02-07 DIAGNOSIS — I2510 Atherosclerotic heart disease of native coronary artery without angina pectoris: Secondary | ICD-10-CM | POA: Diagnosis not present

## 2019-02-07 DIAGNOSIS — Y929 Unspecified place or not applicable: Secondary | ICD-10-CM | POA: Insufficient documentation

## 2019-02-07 DIAGNOSIS — I4819 Other persistent atrial fibrillation: Secondary | ICD-10-CM | POA: Insufficient documentation

## 2019-02-07 DIAGNOSIS — S0083XA Contusion of other part of head, initial encounter: Secondary | ICD-10-CM | POA: Insufficient documentation

## 2019-02-07 DIAGNOSIS — Y9301 Activity, walking, marching and hiking: Secondary | ICD-10-CM | POA: Insufficient documentation

## 2019-02-07 DIAGNOSIS — S0081XA Abrasion of other part of head, initial encounter: Secondary | ICD-10-CM | POA: Diagnosis not present

## 2019-02-07 MED ORDER — TETANUS-DIPHTH-ACELL PERTUSSIS 5-2.5-18.5 LF-MCG/0.5 IM SUSP
0.5000 mL | Freq: Once | INTRAMUSCULAR | Status: AC
  Administered 2019-02-07: 18:00:00 0.5 mL via INTRAMUSCULAR
  Filled 2019-02-07: qty 0.5

## 2019-02-07 NOTE — ED Notes (Signed)
ED Provider at bedside. 

## 2019-02-07 NOTE — ED Triage Notes (Signed)
Pt tripped and fell. He hit his head, takes Eliquis. Denies LOC. Laceration to forehead and bridge of nose.

## 2019-02-07 NOTE — ED Notes (Signed)
Patient transported to CT 

## 2019-02-07 NOTE — Discharge Instructions (Signed)
If you start to develop confusion, difficulty walking, vision changes or any behavioral changes you will need a repeat CAT scan.  Today's CAT scan looks normal.  You can apply Vaseline gauze or bacitracin to the cuts on the face.  They should heal without problem.

## 2019-02-07 NOTE — ED Provider Notes (Signed)
[facility] EMERGENCY DEPARTMENT Provider Note   CSN: [id number] Arrival date & time: 02/07/19  1623     History   Chief Complaint Chief Complaint  Patient presents with  . Fall    HPI Luis [name] is a 84 y.o. male.     Patient is an 84 year old male with a history of MI, atrial fibrillation, TIA who is currently on Eliquis presenting today after a fall.  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  He hit his face and nose on the concrete as well as his left hand.  He did not lose consciousness and was able to get up immediately.  After this occurred he took some ibuprofen and states his hand feels much better.  He has no pain in his legs or difficulty walking.  Tetanus shot was last updated in 2011  The history is provided by the patient.  Fall This is a new problem. The current episode started 1 to 2 hours ago. The problem occurs constantly. The problem has been resolved. Associated symptoms comments: Mild facial pain and pain in the left hand.  No chest pain, abdominal pain, neck pain or headache.  No loss of consciousness or visual changes. Nothing aggravates the symptoms. Nothing relieves the symptoms. Treatments tried: ibuprofen. The treatment provided significant relief.    Past Medical History:  Diagnosis Date  . CAD (coronary artery disease)    a. BMS-RCA, BMS-Cx, PTCA of OM1 in 2001. b. overlapping DES in RCA and PTCA to small distal Cx 2013. c. Cath 2016 -> stable CAD, suspect dyspnea unrelated to coronary disease.  Marland Kitchen COLONIC POLYPS, HX OF    Dr Earlean Shawl  . DJD (degenerative joint disease)   . Essential hypertension   . Hyperlipidemia   . Myocardial infarction ([place]) 08/1999  . Persistent atrial fibrillation    a. dx 2016.  Marland Kitchen RBBB   . TIA (transient ischemic attack)     Patient Active Problem List   Diagnosis Date Noted   . Insomnia 02/18/2018  . Dizziness 11/27/2017  . Unsteady gait 10/14/2017  . TIA (transient ischemic attack) 11/29/2014  . Atrial fibrillation ([place]) 11/29/2014  . HTN (hypertension) 11/29/2014  . HLD (hyperlipidemia) 10/27/2007  . Coronary atherosclerosis 10/27/2007  . COLONIC POLYPS, HX OF 10/27/2007    Past Surgical History:  Procedure Laterality Date  . CARDIAC CATHETERIZATION  ~ 2002  . CARDIAC CATHETERIZATION N/A 03/02/2015   Procedure: Left Heart Cath and Coronary Angiography;  Surgeon: Sherren Mocha, MD;  Location: [facility];  Service: Cardiovascular;  Laterality: N/A;  . CATARACT EXTRACTION W/ INTRAOCULAR LENS  IMPLANT, BILATERAL  ~ 2007   bilaterally  . CORONARY ANGIOPLASTY  07/30/11  . CORONARY ANGIOPLASTY WITH STENT PLACEMENT  2001; 07/30/11;   "2+3"  . INGUINAL HERNIA REPAIR  2005   left  . LEFT HEART CATHETERIZATION WITH CORONARY ANGIOGRAM N/A 07/30/2011   Procedure: LEFT HEART CATHETERIZATION WITH CORONARY ANGIOGRAM;  Surgeon: Burnell Blanks, MD;  Location: [facility] CATH LAB;  Service: Cardiovascular;  Laterality: N/A;  . PERCUTANEOUS CORONARY STENT INTERVENTION (PCI-S)  07/30/2011   Procedure: PERCUTANEOUS CORONARY STENT INTERVENTION (PCI-S);  Surgeon: Burnell Blanks, MD;  Location: [facility] CATH LAB;  Service: Cardiovascular;;  . RETINAL DETACHMENT SURGERY  05/2008; 06/2008; 07/2008   left; "all 3 surgeries have failed"  Home Medications    Prior to Admission medications   Medication Sig Start Date End Date Taking? Authorizing Provider  atenolol (TENORMIN) 25 MG tablet TAKE 1 TABLET ONCE DAILY. 12/11/18   Dunn, Nedra Hai, PA-C  atorvastatin (LIPITOR) 80 MG tablet TAKE 1 TABLET ONCE DAILY. 11/26/18   Burnell Blanks, MD  ELIQUIS 5 MG TABS tablet TAKE 1 TABLET BY MOUTH TWICE DAILY. 07/23/18   Burnell Blanks, MD  ezetimibe (ZETIA) 10 MG tablet TAKE (1) TABLET DAILY AT BEDTIME. 12/05/18   Burnell Blanks, MD  folic acid (FOLVITE) 1  MG tablet Take by mouth. 08/31/09   [provider]  Glucosamine HCl 1000 MG TABS Take 1 tablet by mouth daily.     [provider]  lubiprostone (AMITIZA) 24 MCG capsule Take daily for first 2-3 weeks if no change then increase to 2 tabs daily Patient taking differently: Take 48 mcg by mouth daily. Take daily for first 2-3 weeks if no change then increase to 2 tabs daily 04/15/18   Mansouraty, Telford Nab., MD  MELATONIN PO Take 1 tablet by mouth as needed.     [provider]  Misc Natural Products (BLACK CHERRY CONCENTRATE PO) Take by mouth. 08/31/09   [provider]  NITROSTAT 0.4 MG SL tablet DISSOLVE 1 TABLET UNDER TONGUE AS NEEDED FOR CHEST PAIN,MAY REPEAT IN5 MINUTES FOR 2 DOSES. 07/16/18   Burnell Blanks, MD  NON FORMULARY     [provider]  Potassium Aminobenzoate (POTABA) 500 MG TABS Take 1 tablet by mouth daily.    [provider]  vitamin B-12 (CYANOCOBALAMIN) 1000 MCG tablet Take 1,000 mcg by mouth daily.    [provider]  vitamin E 1000 UNIT capsule Take 2,000 Units by mouth daily. 08/31/09   [provider]  zolpidem (AMBIEN) 5 MG tablet Take 1 tablet (5 mg total) by mouth at bedtime as needed for sleep. 03/27/18   Vivi Barrack, MD    Family History Family History  Problem Relation Age of Onset  . Colon cancer Father        in 63s  . Prostate cancer Father   . Alcohol abuse Mother     Social History Social History   Tobacco Use  . Smoking status: Former Smoker    Packs/day: 0.50    Years: 40.00    Pack years: 20.00    Types: Cigarettes    Quit date: 06/12/1999    Years since quitting: 19.6  . Smokeless tobacco: Never Used  Substance Use Topics  . 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 Drug use: No     Allergies   Patient has no known allergies.   Review of Systems Review of Systems  All other systems reviewed and are negative.     Physical Exam Updated Vital Signs BP (!) 170/93   Pulse 65   Temp 97.7 F (36.5 C) (Oral)   Resp 20   Ht 6' (1.829 m)   Wt 93 kg   SpO2 95%   BMI 27.80 kg/m   Physical Exam Vitals signs and nursing note reviewed.  Constitutional:      General: He is not in acute distress.    Appearance: He is well-developed and normal weight.  HENT:     Head: Normocephalic. Abrasion present.      Right Ear: Tympanic membrane normal.     Left Ear: Tympanic membrane normal.  Mouth/Throat:     Mouth: Mucous membranes are moist. No injury.  Eyes:     Conjunctiva/sclera: Conjunctivae normal.     Pupils: Pupils are equal, round, and reactive to light.  Neck:     Musculoskeletal: Normal range of motion and neck supple. No muscular tenderness.  Cardiovascular:     Rate and Rhythm: Normal rate and regular rhythm.     Pulses: Normal pulses.     Heart sounds: No murmur.  Pulmonary:     Effort: Pulmonary effort is normal. No respiratory distress.     Breath sounds: Normal breath sounds. No wheezing or rales.  Abdominal:     General: There is no distension.     Palpations: Abdomen is soft.     Tenderness: There is no abdominal tenderness. There is no guarding or rebound.  Musculoskeletal: Normal range of motion.        General: No tenderness.     Right wrist: Normal.     Left wrist: Normal.     Right hip: Normal.     Left hip: Normal.       Hands:  Skin:    General: Skin is warm and dry.     Findings: No erythema or rash.  Neurological:     General: No focal deficit present.     Mental Status: He is alert and oriented to person, place, and time. Mental status is at baseline.  Psychiatric:        Mood and Affect: Mood normal.        Behavior: Behavior normal.        Thought Content: Thought content normal.      ED Treatments / Results  Labs (all labs ordered are listed, but only abnormal results are displayed) Labs Reviewed - No data to display  EKG None  Radiology Ct  Head Wo Contrast  Result Date: 02/07/2019 CLINICAL DATA:  Fall.  On Eliquis. EXAM: CT HEAD WITHOUT CONTRAST TECHNIQUE: Contiguous axial images were obtained from the base of the skull through the vertex without intravenous contrast. COMPARISON:  CT head dated March 04, 2017. FINDINGS: Brain: No evidence of acute infarction, hemorrhage, hydrocephalus, extra-axial collection or mass lesion/mass effect. Old lacunar infarct in the lateral left thalamus again noted. Stable mild atrophy. Vascular: Calcified atherosclerosis at the skullbase. No hyperdense vessel. Skull: Normal. Negative for fracture or focal lesion. Sinuses/Orbits: No acute finding. Other: Small forehead hematoma extending to the nasal bridge. IMPRESSION: 1. No acute intracranial abnormality. Small forehead hematoma extending to the nasal bridge. Electronically Signed   By: Titus Dubin M.D.   On: 02/07/2019 17:22    Procedures Procedures (including critical care time)  Medications Ordered in ED Medications  Tdap (BOOSTRIX) injection 0.5 mL (0.5 mLs Intramuscular Given 02/07/19 1818)     Initial Impression / Assessment and Plan / ED Course  I have reviewed the triage vital signs and the nursing notes.  Pertinent labs & imaging results that were available during my care of the patient were reviewed by me and considered in my medical decision making (see chart for details).       Elderly male who presented today after a trip and fall.  Patient does take Eliquis and had injury to the face.  CT of the head is negative for acute findings and no lacerations that require repair on the face.  Low suspicion for facial fracture and patient does have some mild ecchymosis and tenderness in the left hand but no bony tenderness and  full range of motion with low suspicion for fracture.  Patient's tetanus shot was updated and he was discharged home with return precautions.  Final Clinical Impressions(s) / ED Diagnoses   Final diagnoses:   Contusion of face, initial encounter  Fall, initial encounter  Minor head injury, initial encounter    ED Discharge Orders    None       Blanchie Dessert, MD 02/07/19 [phone]

## 2019-02-09 ENCOUNTER — Telehealth: Payer: Self-pay | Admitting: Family Medicine

## 2019-02-09 ENCOUNTER — Other Ambulatory Visit: Payer: Self-pay

## 2019-02-09 DIAGNOSIS — E7849 Other hyperlipidemia: Secondary | ICD-10-CM

## 2019-02-09 DIAGNOSIS — Z0001 Encounter for general adult medical examination with abnormal findings: Secondary | ICD-10-CM

## 2019-02-09 NOTE — Telephone Encounter (Signed)
Lab orders have been placed.  Please schedule patient for lab appointment.

## 2019-02-09 NOTE — Telephone Encounter (Signed)
I returned the patient's call about wanting lab work before the physical. I explained that there have been issues on the back end with insurance and trying to do the labs prior to physicals. The patient stated that he "told us during his first physical and told Dr. Jerline Pain that his labs are to be done before his physical." He stated that he "does not care about insurance covering and will pay out of pocket if needed."  I advised that I needed to send a message to the clinical team to ask Dr. Jerline Pain to place lab orders and we will call back to schedule a lab appointment once completed.   Please send message to Admin pool once lab orders are placed.

## 2019-02-09 NOTE — Telephone Encounter (Signed)
Please advise 

## 2019-02-09 NOTE — Telephone Encounter (Signed)
Patient has been scheduled for labs  

## 2019-02-09 NOTE — Telephone Encounter (Signed)
Ok with patient getting same labs as last year.  Luis [name]. Jerline Pain, MD 02/09/2019 2:47 PM

## 2019-02-12 DIAGNOSIS — H52203 Unspecified astigmatism, bilateral: Secondary | ICD-10-CM | POA: Diagnosis not present

## 2019-02-12 DIAGNOSIS — H5203 Hypermetropia, bilateral: Secondary | ICD-10-CM | POA: Diagnosis not present

## 2019-02-12 DIAGNOSIS — H31002 Unspecified chorioretinal scars, left eye: Secondary | ICD-10-CM | POA: Diagnosis not present

## 2019-02-17 ENCOUNTER — Other Ambulatory Visit (INDEPENDENT_AMBULATORY_CARE_PROVIDER_SITE_OTHER): Payer: Medicare HMO

## 2019-02-17 ENCOUNTER — Other Ambulatory Visit: Payer: Self-pay

## 2019-02-17 DIAGNOSIS — E7849 Other hyperlipidemia: Secondary | ICD-10-CM | POA: Diagnosis not present

## 2019-02-17 DIAGNOSIS — Z0001 Encounter for general adult medical examination with abnormal findings: Secondary | ICD-10-CM

## 2019-02-17 LAB — LIPID PANEL
Cholesterol: 156 mg/dL (ref 0–200)
HDL: 59.9 mg/dL (ref >39.00)
LDL Cholesterol: 84 mg/dL (ref 0–99)
NonHDL: 96.1
Total CHOL/HDL Ratio: 3
Triglycerides: 60 mg/dL (ref 0.0–149.0)
VLDL: 12 mg/dL (ref 0.0–40.0)

## 2019-02-17 LAB — COMPREHENSIVE METABOLIC PANEL
ALT: 20 U/L (ref 0–53)
AST: 19 U/L (ref 0–37)
Albumin: 4.1 g/dL (ref 3.5–5.2)
Alkaline Phosphatase: 67 U/L (ref 39–117)
BUN: 15 mg/dL (ref 6–23)
CO2: 25 mEq/L (ref 19–32)
Calcium: 9.3 mg/dL (ref 8.4–10.5)
Chloride: 106 mEq/L (ref 96–112)
Creatinine, Ser: 1.04 mg/dL (ref 0.40–1.50)
GFR: 68.37 mL/min (ref >60.00)
Glucose, Bld: 85 mg/dL (ref 70–99)
Potassium: 4.1 mEq/L (ref 3.5–5.1)
Sodium: 141 mEq/L (ref 135–145)
Total Bilirubin: 0.9 mg/dL (ref 0.2–1.2)
Total Protein: 6.5 g/dL (ref 6.0–8.3)

## 2019-02-17 LAB — TSH: TSH: 2.12 u[IU]/mL (ref 0.35–4.50)

## 2019-02-17 LAB — CBC
HCT: 43.5 % (ref 39.0–52.0)
Hemoglobin: 14.5 g/dL (ref 13.0–17.0)
MCHC: 33.4 g/dL (ref 30.0–36.0)
MCV: 92.3 fl (ref 78.0–100.0)
Platelets: 214 10*3/uL (ref 150.0–400.0)
RBC: 4.71 Mil/uL (ref 4.22–5.81)
RDW: 14.3 % (ref 11.5–15.5)
WBC: 6.3 10*3/uL (ref 4.0–10.5)

## 2019-02-17 LAB — PSA: PSA: 5.29 ng/mL — ABNORMAL HIGH (ref 0.10–4.00)

## 2019-02-19 ENCOUNTER — Encounter: Payer: Self-pay | Admitting: Family Medicine

## 2019-02-19 ENCOUNTER — Other Ambulatory Visit: Payer: Self-pay

## 2019-02-19 ENCOUNTER — Ambulatory Visit (INDEPENDENT_AMBULATORY_CARE_PROVIDER_SITE_OTHER): Payer: Medicare HMO | Admitting: Family Medicine

## 2019-02-19 VITALS — BP 116/64 | HR 61 | Temp 97.8°F | Ht 72.0 in | Wt 213.0 lb

## 2019-02-19 DIAGNOSIS — E7849 Other hyperlipidemia: Secondary | ICD-10-CM

## 2019-02-19 DIAGNOSIS — I4819 Other persistent atrial fibrillation: Secondary | ICD-10-CM

## 2019-02-19 DIAGNOSIS — I779 Disorder of arteries and arterioles, unspecified: Secondary | ICD-10-CM | POA: Insufficient documentation

## 2019-02-19 DIAGNOSIS — F439 Reaction to severe stress, unspecified: Secondary | ICD-10-CM | POA: Insufficient documentation

## 2019-02-19 DIAGNOSIS — I739 Peripheral vascular disease, unspecified: Secondary | ICD-10-CM

## 2019-02-19 DIAGNOSIS — F339 Major depressive disorder, recurrent, unspecified: Secondary | ICD-10-CM | POA: Insufficient documentation

## 2019-02-19 DIAGNOSIS — Z0001 Encounter for general adult medical examination with abnormal findings: Secondary | ICD-10-CM

## 2019-02-19 DIAGNOSIS — E663 Overweight: Secondary | ICD-10-CM | POA: Diagnosis not present

## 2019-02-19 DIAGNOSIS — G4700 Insomnia, unspecified: Secondary | ICD-10-CM

## 2019-02-19 DIAGNOSIS — Z23 Encounter for immunization: Secondary | ICD-10-CM | POA: Diagnosis not present

## 2019-02-19 DIAGNOSIS — Z6828 Body mass index (BMI) 28.0-28.9, adult: Secondary | ICD-10-CM

## 2019-02-19 DIAGNOSIS — R972 Elevated prostate specific antigen [PSA]: Secondary | ICD-10-CM

## 2019-02-19 DIAGNOSIS — R69 Illness, unspecified: Secondary | ICD-10-CM | POA: Diagnosis not present

## 2019-02-19 MED ORDER — SERTRALINE HCL 100 MG PO TABS: 100.0000 mg | ORAL_TABLET | Freq: Every day | ORAL | 3 refills | Status: DC

## 2019-02-19 NOTE — Patient Instructions (Signed)
It was very nice to see you today!  Please start the Zoloft.  Please come back in 3 to 6 months to have your PSA checked.  We will give you a flu vaccine today.  Come back to see me in 1 year for your next physical, or sooner if needed.  Take care, Dr Jerline Pain  Please try these tips to maintain a healthy lifestyle:   Eat at least 3 REAL meals and 1-2 snacks per day.  Aim for no more than 5 hours between eating.  If you eat breakfast, please do so within one hour of getting up.    Obtain twice as many fruits/vegetables as protein or carbohydrate foods for both lunch and dinner. (Half of each meal should be fruits/vegetables, one quarter protein, and one quarter starchy carbs)   Cut down on sweet beverages. This includes juice, soda, and sweet tea.    Exercise at least 150 minutes every week.    Preventive Care 51 Years and Older, Male Preventive care refers to lifestyle choices and visits with your health care provider that can promote health and wellness. This includes:  A yearly physical exam. This is also called an annual well check.  Regular dental and eye exams.  Immunizations.  Screening for certain conditions.  Healthy lifestyle choices, such as diet and exercise. What can I expect for my preventive care visit? Physical exam Your health care provider will check:  Height and weight. These may be used to calculate body mass index (BMI), which is a measurement that tells if you are at a healthy weight.  Heart rate and blood pressure.  Your skin for abnormal spots. Counseling Your health care provider may ask you questions about:  Alcohol, tobacco, and drug use.  Emotional well-being.  Home and relationship well-being.  Sexual activity.  Eating habits.  History of falls.  Memory and ability to understand (cognition).  Work and work Statistician. What immunizations do I need?  Influenza (flu) vaccine  This is recommended every year. Tetanus,  diphtheria, and pertussis (Tdap) vaccine  You may need a Td booster every 10 years. Varicella (chickenpox) vaccine  You may need this vaccine if you have not already been vaccinated. Zoster (shingles) vaccine  You may need this after age 66. Pneumococcal conjugate (PCV13) vaccine  One dose is recommended after age 42. Pneumococcal polysaccharide (PPSV23) vaccine  One dose is recommended after age 59. Measles, mumps, and rubella (MMR) vaccine  You may need at least one dose of MMR if you were born in 1957 or later. You may also need a second dose. Meningococcal conjugate (MenACWY) vaccine  You may need this if you have certain conditions. Hepatitis A vaccine  You may need this if you have certain conditions or if you travel or work in places where you may be exposed to hepatitis A. Hepatitis B vaccine  You may need this if you have certain conditions or if you travel or work in places where you may be exposed to hepatitis B. Haemophilus influenzae type b (Hib) vaccine  You may need this if you have certain conditions. You may receive vaccines as individual doses or as more than one vaccine together in one shot (combination vaccines). Talk with your health care provider about the risks and benefits of combination vaccines. What tests do I need? Blood tests  Lipid and cholesterol levels. These may be checked every 5 years, or more frequently depending on your overall health.  Hepatitis C test.  Hepatitis B test.  Screening  Lung cancer screening. You may have this screening every year starting at age 23 if you have a 30-pack-year history of smoking and currently smoke or have quit within the past 15 years.  Colorectal cancer screening. All adults should have this screening starting at age 72 and continuing until age 59. Your health care provider may recommend screening at age 91 if you are at increased risk. You will have tests every 1-10 years, depending on your results and  the type of screening test.  Prostate cancer screening. Recommendations will vary depending on your family history and other risks.  Diabetes screening. This is done by checking your blood sugar (glucose) after you have not eaten for a while (fasting). You may have this done every 1-3 years.  Abdominal aortic aneurysm (AAA) screening. You may need this if you are a current or former smoker.  Sexually transmitted disease (STD) testing. Follow these instructions at home: Eating and drinking  Eat a diet that includes fresh fruits and vegetables, whole grains, lean protein, and low-fat dairy products. Limit your intake of foods with high amounts of sugar, saturated fats, and salt.  Take vitamin and mineral supplements as recommended by your health care provider.  Do not drink alcohol if your health care provider tells you not to drink.  If you drink alcohol: ? Limit how much you have to 0-2 drinks a day. ? Be aware of how much alcohol is in your drink. In the [place] one drink equals one 12 oz bottle of beer (355 mL), one 5 oz glass of wine (148 mL), or one 1 oz glass of hard liquor (44 mL). Lifestyle  Take daily care of your teeth and gums.  Stay active. Exercise for at least 30 minutes on 5 or more days each week.  Do not use any products that contain nicotine or tobacco, such as cigarettes, e-cigarettes, and chewing tobacco. If you need help quitting, ask your health care provider.  If you are sexually active, practice safe sex. Use a condom or other form of protection to prevent STIs (sexually transmitted infections).  Talk with your health care provider about taking a low-dose aspirin or statin. What's next?  Visit your health care provider once a year for a well check visit.  Ask your health care provider how often you should have your eyes and teeth checked.  Stay up to date on all vaccines. This information is not intended to replace advice given to you by your health care  provider. Make sure you discuss any questions you have with your health care provider. Document Released: 06/24/2015 Document Revised: 05/22/2018 Document Reviewed: 05/22/2018 Elsevier Patient Education  2020 [employer].

## 2019-02-19 NOTE — Addendum Note (Signed)
Addended by: Loralyn Freshwater on: 02/19/2019 04:04 PM   Modules accepted: Orders

## 2019-02-19 NOTE — Assessment & Plan Note (Signed)
Lipids at goal.  Continue Lipitor 80 mg daily and Zetia 10 mg daily.

## 2019-02-19 NOTE — Assessment & Plan Note (Signed)
Discussed treatment options.  Patient wishes to try as needed Zoloft.  Discussed this is not typical dosing pattern however will initiate given concurrent premature ejaculation.

## 2019-02-19 NOTE — Assessment & Plan Note (Signed)
Stable. Continue ambien as needed.

## 2019-02-19 NOTE — Progress Notes (Addendum)
Chief Complaint:  Luis [name] is a 84 y.o. male who presents today for his annual comprehensive physical exam and subsequent medicare annual wellness visit.    Assessment/Plan:  Atrial fibrillation (HCC) Stable.  Appears to be in sinus rhythm today.  Continue atenolol and Eliquis.  HLD (hyperlipidemia) Lipids at goal.  Continue Lipitor 80 mg daily and Zetia 10 mg daily.  Mild carotid artery disease (HCC) Continue statin and blood pressure control.  Stress Discussed treatment options.  Patient wishes to try as needed Zoloft.  Discussed this is not typical dosing pattern however will initiate given concurrent premature ejaculation.   Insomnia Stable. Continue ambien as needed.   Elevated PSA Mildly elevated. Recheck in a few months. Future order placed.   Body mass index is 28.89 kg/m. / Overweight BMI Metric Follow Up - 02/19/19 1518      BMI Metric Follow Up-Please document annually   BMI Metric Follow Up  Education provided        Preventative Healthcare: Flu vaccine given today.  Up-to-date on pneumonia vaccine.  No longer needs colon cancer screening.  PSA elevated-see above.  Patient Counseling(The following topics were reviewed and/or handout was given):  -Nutrition: Stressed importance of moderation in sodium/caffeine intake, saturated fat and cholesterol, caloric balance, sufficient intake of fresh fruits, vegetables, and fiber.  -Stressed the importance of regular exercise.   -Substance Abuse: Discussed cessation/primary prevention of tobacco, alcohol, or other drug use; driving or other dangerous activities under the influence; availability of treatment for abuse.   -Injury prevention: Discussed safety belts, safety helmets, smoke detector, smoking near bedding or upholstery.   -Sexuality: Discussed sexually transmitted diseases, partner selection, use of condoms, avoidance of unintended pregnancy and contraceptive alternatives.   -Dental health: Discussed  importance of regular tooth brushing, flossing, and dental visits.  -Health maintenance and immunizations reviewed. Please refer to Health maintenance section.  During the course of the visit the patient was educated and counseled about appropriate screening and preventive services including:        Fall prevention   Nutrition Physical Activity Weight Management Cognition  Return to care in 1 year for next preventative visit.     Subjective:  HPI:  He has had some increasing stress recently.  This is mostly due to the COVID-19 pandemic.  Has had some increased anxiety related to work changes due to COVID-19.  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  He will be seeing a dermatologist soon.  Health Risk Assessment: Patient considers his overall health to be good. He has no difficulty performing the following: . Preparing food and eating . Bathing  . Getting dressed . Using the toilet . Shopping . Managing Finances . Moving around from place to place  He fell about a week ago after tripping on pavement.  Otherwise has not had any other falls within the past year.  Depression screen PHQ 2/9 02/19/2019  Decreased Interest 0  Down, Depressed, Hopeless 0  PHQ - 2 Score 0   Lifestyle Factors: Diet: No specific diets, tries eat a healthy balanced diet. Exercise: Likes to walk daily.  Patient Care Team: Vivi Barrack, MD as PCP - General (Family Medicine) Burnell Blanks, MD as PCP - Cardiology (Cardiology)   His chronic medical conditions are outlined below:  # Insomnia - On ambien 5mg  nightly as needed and tolerates well.  # Dyslipidemia - On zetia 10mg  daily and lipitor 80mg  daily - ROS: No reported myalgias.  #  Atrial Fibrillation - On atenolol 25mg  daily and tolerating well - Anticoagulated on eliquis 5mg  twice daily - ROS: No reported palpitations, chest pain, shortness of breath.  ROS: Per HPI, otherwise a complete review of  systems was negative.   PMH:  The following were reviewed and entered/updated in epic: Past Medical History:  Diagnosis Date  . CAD (coronary artery disease)    a. BMS-RCA, BMS-Cx, PTCA of OM1 in 2001. b. overlapping DES in RCA and PTCA to small distal Cx 2013. c. Cath 2016 -> stable CAD, suspect dyspnea unrelated to coronary disease.  Marland Kitchen COLONIC POLYPS, HX OF    Dr Earlean Shawl  . DJD (degenerative joint disease)   . Essential hypertension   . Hyperlipidemia   . Myocardial infarction ([place]) 08/1999  . Persistent atrial fibrillation    a. dx 2016.  Marland Kitchen RBBB   . TIA (transient ischemic attack)    Patient Active Problem List   Diagnosis Date Noted  . Stress 02/19/2019  . Mild carotid artery disease ([place]) 02/19/2019  . Insomnia 02/18/2018  . Dizziness 11/27/2017  . Unsteady gait 10/14/2017  . TIA (transient ischemic attack) 11/29/2014  . Atrial fibrillation ([place]) 11/29/2014  . HTN (hypertension) 11/29/2014  . HLD (hyperlipidemia) 10/27/2007  . Coronary atherosclerosis 10/27/2007  . COLONIC POLYPS, HX OF 10/27/2007   Past Surgical History:  Procedure Laterality Date  . CARDIAC CATHETERIZATION  ~ 2002  . CARDIAC CATHETERIZATION N/A 03/02/2015   Procedure: Left Heart Cath and Coronary Angiography;  Surgeon: Sherren Mocha, MD;  Location: [facility];  Service: Cardiovascular;  Laterality: N/A;  . CATARACT EXTRACTION W/ INTRAOCULAR LENS  IMPLANT, BILATERAL  ~ 2007   bilaterally  . CORONARY ANGIOPLASTY  07/30/11  . CORONARY ANGIOPLASTY WITH STENT PLACEMENT  2001; 07/30/11;   "2+3"  . INGUINAL HERNIA REPAIR  2005   left  . LEFT HEART CATHETERIZATION WITH CORONARY ANGIOGRAM N/A 07/30/2011   Procedure: LEFT HEART CATHETERIZATION WITH CORONARY ANGIOGRAM;  Surgeon: Burnell Blanks, MD;  Location: [facility] CATH LAB;  Service: Cardiovascular;  Laterality: N/A;  . PERCUTANEOUS CORONARY STENT INTERVENTION (PCI-S)  07/30/2011   Procedure: PERCUTANEOUS CORONARY STENT INTERVENTION (PCI-S);   Surgeon: Burnell Blanks, MD;  Location: [facility] CATH LAB;  Service: Cardiovascular;;  . RETINAL DETACHMENT SURGERY  05/2008; 06/2008; 07/2008   left; "all 3 surgeries have failed"    Family History  Problem Relation Age of Onset  . Colon cancer Father        in 11s  . Prostate cancer Father   . Alcohol abuse Mother     Medications- reviewed and updated Current Outpatient Medications  Medication Sig Dispense Refill  . atenolol (TENORMIN) 25 MG tablet TAKE 1 TABLET ONCE DAILY. 90 tablet 1  . atorvastatin (LIPITOR) 80 MG tablet TAKE 1 TABLET ONCE DAILY. 90 tablet 0  . ELIQUIS 5 MG TABS tablet TAKE 1 TABLET BY MOUTH TWICE DAILY. 60 tablet 8  . ezetimibe (ZETIA) 10 MG tablet TAKE (1) TABLET DAILY AT BEDTIME. 90 tablet 1  . folic acid (FOLVITE) 1 MG tablet Take by mouth.    . Glucosamine HCl 1000 MG TABS Take 1 tablet by mouth daily.     Marland Kitchen MELATONIN PO Take 1 tablet by mouth as needed.     . Misc Natural Products (BLACK CHERRY CONCENTRATE PO) Take by mouth.    Marland Kitchen NITROSTAT 0.4 MG SL tablet DISSOLVE 1 TABLET UNDER TONGUE AS NEEDED FOR CHEST PAIN,MAY REPEAT IN5 MINUTES FOR 2 DOSES. 25 tablet  3  . NON FORMULARY     . Potassium Aminobenzoate (POTABA) 500 MG TABS Take 1 tablet by mouth daily.    . vitamin B-12 (CYANOCOBALAMIN) 1000 MCG tablet Take 1,000 mcg by mouth daily.    . vitamin E 1000 UNIT capsule Take 2,000 Units by mouth daily.    Marland Kitchen zolpidem (AMBIEN) 5 MG tablet Take 1 tablet (5 mg total) by mouth at bedtime as needed for sleep. 15 tablet 1  . sertraline (ZOLOFT) 100 MG tablet Take 1 tablet (100 mg total) by mouth daily. 30 tablet 3   No current facility-administered medications for this visit.     Allergies-reviewed and updated No Known Allergies  Social History   Socioeconomic History  . Marital status: Married    Spouse name: Not on file  . Number of children: Not on file  . Years of education: Not on file  . Highest education level: Not on file  Occupational History   . Not on file  Social Needs  . Financial resource strain: Not on file  . Food insecurity    Worry: Not on file    Inability: Not on file  . Transportation needs    Medical: Not on file    Non-medical: Not on file  Tobacco Use  . Smoking status: Former Smoker    Packs/day: 0.50    Years: 40.00    Pack years: 20.00    Types: Cigarettes    Quit date: 06/12/1999    Years since quitting: 19.7  . Smokeless tobacco: Never Used  Substance and Sexual Activity  . 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 Drug use: No  . Sexual activity: Not Currently  Lifestyle  . Physical activity    Days per week: Not on file    Minutes per session: Not on file  . Stress: Not on file  Relationships  . Social Herbalist on phone: Not on file    Gets together: Not on file    Attends religious service: Not on file    Active member of club or organization: Not on file    Attends meetings of clubs or organizations: Not on file    Relationship status: Not on file  Other Topics Concern  . Not on file  Social History Narrative  . Not on file        Objective:  Physical Exam: BP 116/64   Pulse 61   Temp 97.8 F (36.6 C)   Ht 6' (1.829 m)   Wt 213 lb (96.6 kg)   SpO2 98%   BMI 28.89 kg/m   Body mass index is 28.89 kg/m. Wt Readings from Last 3 Encounters:  02/19/19 213 lb (96.6 kg)  02/07/19 205 lb (93 kg)  06/02/18 223 lb 6.4 oz (101.3 kg)   Gen: NAD, resting comfortably HEENT: TMs normal bilaterally. OP clear. No thyromegaly noted.  CV: RRR with no murmurs appreciated Pulm: NWOB, CTAB with no crackles, wheezes, or rhonchi GI: Normal bowel sounds present. Soft, Nontender, Nondistended. MSK: no edema, cyanosis, or clubbing noted Skin: warm, dry Neuro: CN2-12 grossly intact. Strength 5/5 in upper and lower extremities. Reflexes symmetric and intact bilaterally. Normal minicog.  Psych: Normal affect and thought content      M.  Jerline Pain, MD 02/19/2019 3:23 PM

## 2019-02-19 NOTE — Assessment & Plan Note (Signed)
Continue statin and blood pressure control.

## 2019-02-19 NOTE — Assessment & Plan Note (Signed)
Stable.  Appears to be in sinus rhythm today.  Continue atenolol and Eliquis.

## 2019-02-25 ENCOUNTER — Other Ambulatory Visit: Payer: Self-pay | Admitting: Cardiovascular Disease

## 2019-04-13 DIAGNOSIS — Z20828 Contact with and (suspected) exposure to other viral communicable diseases: Secondary | ICD-10-CM | POA: Diagnosis not present

## 2019-04-13 DIAGNOSIS — R509 Fever, unspecified: Secondary | ICD-10-CM | POA: Diagnosis not present

## 2019-04-13 DIAGNOSIS — J029 Acute pharyngitis, unspecified: Secondary | ICD-10-CM | POA: Diagnosis not present

## 2019-04-13 DIAGNOSIS — R519 Headache, unspecified: Secondary | ICD-10-CM | POA: Diagnosis not present

## 2019-04-14 ENCOUNTER — Encounter: Payer: Self-pay | Admitting: Family Medicine

## 2019-04-14 NOTE — Telephone Encounter (Signed)
Patient stated someone at his office tested positive for Covid 1 week ago,patient was having symptoms of  fatigue,and loss of appetite,he is feeling fine now. Informed him to contact office if symptoms worse or ER if SOB. FYI

## 2019-05-05 ENCOUNTER — Other Ambulatory Visit: Payer: Self-pay | Admitting: Cardiovascular Disease

## 2019-05-05 NOTE — Telephone Encounter (Signed)
84 years old 96kg Last OV 06/02/18 Scr 1.04 on 02/17/2019 Eliquis 5mg  BID sent to pharmacy

## 2019-05-27 ENCOUNTER — Other Ambulatory Visit: Payer: Self-pay | Admitting: Physician Assistant

## 2019-06-01 ENCOUNTER — Other Ambulatory Visit: Payer: Self-pay | Admitting: Cardiovascular Disease

## 2019-06-23 ENCOUNTER — Other Ambulatory Visit: Payer: Self-pay | Admitting: Cardiovascular Disease

## 2019-06-25 ENCOUNTER — Ambulatory Visit: Payer: Medicare Other | Attending: Internal Medicine

## 2019-06-25 DIAGNOSIS — Z23 Encounter for immunization: Secondary | ICD-10-CM | POA: Insufficient documentation

## 2019-06-25 NOTE — Progress Notes (Signed)
   Covid-19 Vaccination Clinic  Name:  Luis [name]    MRN: [id number] DOB: 03-20-1935  06/25/2019  [name] was observed post Covid-19 immunization for 15 minutes without incidence. He was provided with Vaccine Information Sheet and instruction to access the V-Safe system.   [name] was instructed to call 911 with any severe reactions post vaccine: Marland Kitchen Difficulty breathing  . Swelling of your face and throat  . A fast heartbeat  . A bad rash all over your body  . Dizziness and weakness    Immunizations Administered    Name Date Dose VIS Date Route   Pfizer COVID-19 Vaccine 06/25/2019  8:14 AM 0.3 mL 05/22/2019 Intramuscular   Manufacturer: [employer], [employer]   Lot: [id number]   [place]: [id number]

## 2019-07-15 ENCOUNTER — Ambulatory Visit: Payer: Medicare HMO | Attending: Internal Medicine

## 2019-07-15 DIAGNOSIS — Z23 Encounter for immunization: Secondary | ICD-10-CM | POA: Insufficient documentation

## 2019-07-15 NOTE — Progress Notes (Signed)
   Covid-19 Vaccination Clinic  Name:  ZEDEKIAH KROENKE    MRN: [id number] DOB: 06/07/1935  07/15/2019  Mr. Mastropietro was observed post Covid-19 immunization for 15 minutes without incidence. He was provided with Vaccine Information Sheet and instruction to access the V-Safe system.   Mr. Boldon was instructed to call 911 with any severe reactions post vaccine: Marland Kitchen Difficulty breathing  . Swelling of your face and throat  . A fast heartbeat  . A bad rash all over your body  . Dizziness and weakness    Immunizations Administered    Name Date Dose VIS Date Route   Pfizer COVID-19 Vaccine 07/15/2019  8:18 AM 0.3 mL 05/22/2019 Intramuscular   Manufacturer: [place]   Lot: [id number]   [place]: [id number]

## 2019-07-16 ENCOUNTER — Encounter: Payer: Self-pay | Admitting: Family Medicine

## 2019-07-17 MED ORDER — ZOLPIDEM TARTRATE 5 MG PO TABS: 5.0000 mg | ORAL_TABLET | Freq: Every evening | ORAL | 1 refills | Status: DC | PRN

## 2019-07-27 ENCOUNTER — Other Ambulatory Visit (INDEPENDENT_AMBULATORY_CARE_PROVIDER_SITE_OTHER): Payer: Medicare HMO

## 2019-07-27 ENCOUNTER — Other Ambulatory Visit: Payer: Self-pay | Admitting: Family Medicine

## 2019-07-27 ENCOUNTER — Other Ambulatory Visit: Payer: Self-pay

## 2019-07-27 DIAGNOSIS — R972 Elevated prostate specific antigen [PSA]: Secondary | ICD-10-CM

## 2019-07-27 DIAGNOSIS — R059 Cough, unspecified: Secondary | ICD-10-CM

## 2019-07-27 DIAGNOSIS — Z0000 Encounter for general adult medical examination without abnormal findings: Secondary | ICD-10-CM

## 2019-07-27 DIAGNOSIS — R05 Cough: Secondary | ICD-10-CM

## 2019-07-27 LAB — SARS-COV-2 IGG: SARS-COV-2 IgG: 25.69

## 2019-07-27 LAB — PSA, MEDICARE: PSA: 4.89 ng/ml — ABNORMAL HIGH (ref 0.10–4.00)

## 2019-07-27 NOTE — Progress Notes (Signed)
Please inform patient of the following:  Covid antibody test is positive. This means he either has been exposed to the virus or has already received the vaccine.   PSA is better than last time. We should recheck in 6 months with his annual visit.

## 2019-07-27 NOTE — Addendum Note (Signed)
Addended by: Vivi Barrack on: 07/27/2019 08:02 AM   Modules accepted: Orders

## 2019-07-27 NOTE — Addendum Note (Signed)
Addended by: Francis Dowse T on: 07/27/2019 08:04 AM   Modules accepted: Orders

## 2019-08-04 ENCOUNTER — Other Ambulatory Visit: Payer: Self-pay | Admitting: Cardiovascular Disease

## 2019-08-14 ENCOUNTER — Ambulatory Visit: Payer: Medicare HMO | Admitting: Cardiovascular Disease

## 2019-08-14 ENCOUNTER — Encounter: Payer: Self-pay | Admitting: Cardiovascular Disease

## 2019-08-14 ENCOUNTER — Other Ambulatory Visit: Payer: Self-pay

## 2019-08-14 VITALS — BP 134/92 | HR 56 | Ht 72.0 in | Wt 214.8 lb

## 2019-08-14 DIAGNOSIS — I2510 Atherosclerotic heart disease of native coronary artery without angina pectoris: Secondary | ICD-10-CM

## 2019-08-14 DIAGNOSIS — I4819 Other persistent atrial fibrillation: Secondary | ICD-10-CM

## 2019-08-14 DIAGNOSIS — E785 Hyperlipidemia, unspecified: Secondary | ICD-10-CM | POA: Diagnosis not present

## 2019-08-14 DIAGNOSIS — I10 Essential (primary) hypertension: Secondary | ICD-10-CM | POA: Diagnosis not present

## 2019-08-14 DIAGNOSIS — I779 Disorder of arteries and arterioles, unspecified: Secondary | ICD-10-CM

## 2019-08-14 MED ORDER — AMLODIPINE BESYLATE 5 MG PO TABS: 5.0000 mg | ORAL_TABLET | Freq: Every day | ORAL | 3 refills | Status: DC

## 2019-08-14 NOTE — Progress Notes (Signed)
Chief Complaint  Patient presents with  . Follow-up    CAD    History of Present Illness: 85 yo male with history of CAD, HTN, HLD, prior TIA, mild carotid artery disease and persistent atrial fibrillation here today for cardiac follow up. His cardiac history dates back to 2001 when he had a bare-metal stent placed in the right coronary artery and a bare metal stent placed in the Circumflex artery as well as balloon angioplasty of the ostium of the first obtuse marginal. Cardiac cath February 2013 with placement of two non-overlapping drug eluting stents in the RCA and severe disease in the small distal Circumflex treated with balloon angioplasty only.  He was admitted to [facility] June 2016 with TIA type symptoms and was found to be in atrial fibrillation. He was started on Eliquis and atenolol was continued. Carotid artery dopplers 2019 with mild bilateral disease. 48 Hour monitor June 2016 with persistent atrial fibrillation, rare PVCs. Echo June 2016 with normal LV size and function, no valve issues. He was seen in my office September 2016 with worsened dyspnea. Cardiac cath 03/02/15 with stable CAD.   He is here today for follow up. The patient denies any chest pain, dyspnea, palpitations, lower extremity edema, orthopnea, PND, dizziness, near syncope or syncope.   Primary Care Physician: Vivi Barrack, MD     Past Medical History:  Diagnosis Date  . CAD (coronary artery disease)    a. BMS-RCA, BMS-Cx, PTCA of OM1 in 2001. b. overlapping DES in RCA and PTCA to small distal Cx 2013. c. Cath 2016 -> stable CAD, suspect dyspnea unrelated to coronary disease.  Marland Kitchen COLONIC POLYPS, HX OF    Dr Earlean Shawl  . DJD (degenerative joint disease)   . Essential hypertension   . Hyperlipidemia   . Myocardial infarction ([place]) 08/1999  . Persistent atrial fibrillation ([place])    a. dx 2016.  Marland Kitchen RBBB   . TIA (transient ischemic attack)     Past Surgical History:  Procedure Laterality Date  . CARDIAC  CATHETERIZATION  ~ 2002  . CARDIAC CATHETERIZATION N/A 03/02/2015   Procedure: Left Heart Cath and Coronary Angiography;  Surgeon: Sherren Mocha, MD;  Location: [facility];  Service: Cardiovascular;  Laterality: N/A;  . CATARACT EXTRACTION W/ INTRAOCULAR LENS  IMPLANT, BILATERAL  ~ 2007   bilaterally  . CORONARY ANGIOPLASTY  07/30/11  . CORONARY ANGIOPLASTY WITH STENT PLACEMENT  2001; 07/30/11;   "2+3"  . INGUINAL HERNIA REPAIR  2005   left  . LEFT HEART CATHETERIZATION WITH CORONARY ANGIOGRAM N/A 07/30/2011   Procedure: LEFT HEART CATHETERIZATION WITH CORONARY ANGIOGRAM;  Surgeon: Burnell Blanks, MD;  Location: [facility] CATH LAB;  Service: Cardiovascular;  Laterality: N/A;  . PERCUTANEOUS CORONARY STENT INTERVENTION (PCI-S)  07/30/2011   Procedure: PERCUTANEOUS CORONARY STENT INTERVENTION (PCI-S);  Surgeon: Burnell Blanks, MD;  Location: [facility] CATH LAB;  Service: Cardiovascular;;  . RETINAL DETACHMENT SURGERY  05/2008; 06/2008; 07/2008   left; "all 3 surgeries have failed"    Current Outpatient Medications  Medication Sig Dispense Refill  . atenolol (TENORMIN) 25 MG tablet TAKE 1 TABLET ONCE DAILY. 90 tablet 0  . atorvastatin (LIPITOR) 80 MG tablet Take 1 tablet (80 mg total) by mouth daily. Keep upcoming appointment for further refills 90 tablet 0  . ELIQUIS 5 MG TABS tablet TAKE 1 TABLET BY MOUTH TWICE DAILY. 60 tablet 4  . ezetimibe (ZETIA) 10 MG tablet Take 1 tablet (10 mg total) by mouth at bedtime.  Pt must keep appt in Mar for further refills 90 tablet 0  . folic acid (FOLVITE) 1 MG tablet Take by mouth.    . Glucosamine HCl 1000 MG TABS Take 1 tablet by mouth daily.     Marland Kitchen MELATONIN PO Take 1 tablet by mouth as needed.     . Misc Natural Products (BLACK CHERRY CONCENTRATE PO) Take by mouth.    . nitroGLYCERIN (NITROSTAT) 0.4 MG SL tablet DISSOLVE 1 TABLET UNDER TONGUE AS NEEDED FOR CHEST PAIN,MAY REPEAT IN5 MINUTES FOR 2 DOSES. 25 tablet 4  . NON FORMULARY     .  Potassium Aminobenzoate (POTABA) 500 MG TABS Take 1 tablet by mouth daily.    . sertraline (ZOLOFT) 100 MG tablet Take 1 tablet (100 mg total) by mouth daily. 30 tablet 3  . vitamin E 1000 UNIT capsule Take 2,000 Units by mouth daily.    Marland Kitchen zolpidem (AMBIEN) 5 MG tablet Take 1 tablet (5 mg total) by mouth at bedtime as needed for sleep. 15 tablet 1  . amLODipine (NORVASC) 5 MG tablet Take 1 tablet (5 mg total) by mouth daily. 90 tablet 3   No current facility-administered medications for this visit.    No Known Allergies  Social History   Socioeconomic History  . Marital status: Married    Spouse name: Not on file  . Number of children: Not on file  . Years of education: Not on file  . Highest education level: Not on file  Occupational History  . Not on file  Tobacco Use  . Smoking status: Former Smoker    Packs/day: 0.50    Years: 40.00    Pack years: 20.00    Types: Cigarettes    Quit date: 06/12/1999    Years since quitting: 20.1  . Smokeless tobacco: Never Used  Substance and Sexual Activity  . 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 Drug use: No  . Sexual activity: Not Currently  Other Topics Concern  . Not on file  Social History Narrative  . Not on file   Social Determinants of Health   Financial Resource Strain:   . Difficulty of Paying Living Expenses: Not on file  Food Insecurity:   . Worried About Charity fundraiser in the Last Year: Not on file  . Ran Out of Food in the Last Year: Not on file  Transportation Needs:   . Lack of Transportation (Medical): Not on file  . Lack of Transportation (Non-Medical): Not on file  Physical Activity:   . Days of Exercise per Week: Not on file  . Minutes of Exercise per Session: Not on file  Stress:   . Feeling of Stress : Not on file  Social Connections:   . Frequency of Communication with Friends and Family: Not on file  . Frequency of Social Gatherings with Friends and  Family: Not on file  . Attends Religious Services: Not on file  . Active Member of Clubs or Organizations: Not on file  . Attends Archivist Meetings: Not on file  . Marital Status: Not on file  Intimate Partner Violence:   . Fear of Current or Ex-Partner: Not on file  . Emotionally Abused: Not on file  . Physically Abused: Not on file  . Sexually Abused: Not on file    Family History  Problem Relation Age of Onset  . Colon cancer Father        in 64s  .  Prostate cancer Father   . Alcohol abuse Mother     Review of Systems:  As stated in the HPI and otherwise negative.   BP (!) 134/92   Pulse (!) 56   Ht 6' (1.829 m)   Wt 214 lb 12.8 oz (97.4 kg)   SpO2 97%   BMI 29.13 kg/m   Physical Examination:  General: Well developed, well nourished, NAD  HEENT: OP clear, mucus membranes moist  SKIN: warm, dry. No rashes. Neuro: No focal deficits  Musculoskeletal: Muscle strength 5/5 all ext  Psychiatric: Mood and affect normal  Neck: No JVD, no carotid bruits, no thyromegaly, no lymphadenopathy.  Lungs:Clear bilaterally, no wheezes, rhonci, crackles Cardiovascular: Irregular irregular. No murmurs, gallops or rubs. Abdomen:Soft. Bowel sounds present. Non-tender.  Extremities: No lower extremity edema. Pulses are 2 + in the bilateral DP/PT.  Cardiac cath 03/02/15:  Prox RCA lesion, 20% stenosed. The lesion was previously treated with a drug-eluting stent .  Dist RCA lesion, 10% stenosed. The lesion was previously treated with a drug-eluting stent .  Mid RCA lesion, 30% stenosed.  Mid Cx lesion, 50% stenosed. The lesion was previously treated with a stent (unknown type) .  3rd Mrg lesion, 90% stenosed.  2nd Mrg lesion, 70% stenosed.  Prox LAD lesion, 30% stenosed.  Mid LAD lesion, 30% stenosed.  The left ventricular systolic function is normal.   1. Stable CAD with continued patency of the stented segments in the RCA, mild stenosis of the LCx stent, Mild  diffuse nonobstructive stenosis of the LAD, and severe distal stenosis of small OM subranches 2. Normal LV function  Echo June 2016: Left ventricle: The cavity size was normal. There was mild   concentric hypertrophy. Systolic function was vigorous. The   estimated ejection fraction was in the range of 65% to 70%. Wall   motion was normal; there were no regional wall motion   abnormalities. There was no evidence of elevated ventricular   filling pressure by Doppler parameters.  Impressions:  - No cardiac source of emboli was indentified.  EKG:  EKG is ordered today. The ekg ordered today demonstrates Atrial fib, rate 56 bpm  Recent Labs: 02/17/2019: ALT 20; BUN 15; Creatinine, Ser 1.04; Hemoglobin 14.5; Platelets 214.0; Potassium 4.1; Sodium 141; TSH 2.12   Lipid Panel    Component Value Date/Time   CHOL 156 02/17/2019 1359   CHOL 126 05/15/2017 0807   TRIG 60.0 02/17/2019 1359   TRIG 80 05/07/2006 1036   HDL 59.90 02/17/2019 1359   HDL 52 05/15/2017 0807   CHOLHDL 3 02/17/2019 1359   VLDL 12.0 02/17/2019 1359   LDLCALC 84 02/17/2019 1359   LDLCALC 61 05/15/2017 0807     Wt Readings from Last 3 Encounters:  08/14/19 214 lb 12.8 oz (97.4 kg)  02/19/19 213 lb (96.6 kg)  02/07/19 205 lb (93 kg)     Other studies Reviewed: Additional studies/ records that were reviewed today include: . Review of the above records demonstrates:   Assessment and Plan:   1. CAD without angina: No chest pain. Last cardiac cath September 2016 with stable CAD. Continue statin, Zetia and beta blocker. He is not on ASA or Plavix since he is on Eliquis for atrial fib.    2. Carotid artery disease: Mild disease by dopplers in 2019. Repeat in 2022.    3. HTN: BP is mildly elevated and has been elevated at home. Will start Norvasc 5 mg daily and continue atenolol 25 mg daily. Cannot  increase atenolol due to bradycardia.   4. HLD: LDL near goal in August 2020. Continue statin and Zetia.    5.  Atrial fibrillation, persistent: Rate controlled atrial fib today. Continue atenolol and Eliquis.    Current medicines are reviewed at length with the patient today.  The patient does not have concerns regarding medicines.  The following changes have been made:  no change  Labs/ tests ordered today include:   Orders Placed This Encounter  Procedures  . EKG 12-Lead   Disposition:   FU with me in 12 months.   Signed, Lauree Chandler, MD 08/14/2019 9:47 AM    [facility] [place], [place], [place]  [address] Phone: [phone]; Fax: [phone]

## 2019-08-14 NOTE — Patient Instructions (Signed)
Medication Instructions:  1.) start amlodipine (Norvasc) 5 mg - one tablet daily  *If you need a refill on your cardiac medications before your next appointment, please call your pharmacy*   Lab Work: none  Testing/Procedures: none   Follow-Up: At [facility], you and your health needs are our priority.  As part of our continuing mission to provide you with exceptional heart care, we have created designated Provider Care Teams.  These Care Teams include your primary Cardiologist (physician) and Advanced Practice Providers (APPs -  Physician Assistants and Nurse Practitioners) who all work together to provide you with the care you need, when you need it.  Your next appointment:   12 month(s)  The format for your next appointment:   Either In Person or Virtual  Provider:   You may see Lauree Chandler, MD or one of the following Advanced Practice Providers on your designated Care Team:    Melina Copa, PA-C  Ermalinda Barrios, PA-C    Other Instructions

## 2019-09-04 ENCOUNTER — Other Ambulatory Visit: Payer: Self-pay | Admitting: Cardiovascular Disease

## 2019-09-22 ENCOUNTER — Encounter: Payer: Self-pay | Admitting: Family Medicine

## 2019-09-24 ENCOUNTER — Other Ambulatory Visit: Payer: Self-pay | Admitting: Cardiovascular Disease

## 2019-09-24 ENCOUNTER — Other Ambulatory Visit: Payer: Self-pay | Admitting: Physician Assistant

## 2019-10-05 ENCOUNTER — Other Ambulatory Visit: Payer: Self-pay | Admitting: Cardiovascular Disease

## 2019-10-05 NOTE — Telephone Encounter (Signed)
Prescription refill request for Eliquis received.  Last office visit: [place], 08/14/2019 Scr: 1.04, 02/17/2019 Age: 85 y.o. Weight: 97.4 kg   Prescription refill sent.

## 2019-11-16 ENCOUNTER — Encounter: Payer: Self-pay | Admitting: Plastic Surgery

## 2019-11-16 ENCOUNTER — Ambulatory Visit (INDEPENDENT_AMBULATORY_CARE_PROVIDER_SITE_OTHER): Payer: Self-pay | Admitting: Plastic Surgery

## 2019-11-16 ENCOUNTER — Institutional Professional Consult (permissible substitution): Payer: Medicare HMO | Admitting: Plastic Surgery

## 2019-11-16 ENCOUNTER — Other Ambulatory Visit: Payer: Self-pay

## 2019-11-16 DIAGNOSIS — Z719 Counseling, unspecified: Secondary | ICD-10-CM | POA: Insufficient documentation

## 2019-11-16 MED ORDER — LIDOCAINE-PRILOCAINE 2.5-2.5 % EX CREA
1.0000 | TOPICAL_CREAM | CUTANEOUS | 0 refills | Status: DC | PRN
Start: 2019-11-16 — End: 2020-11-24

## 2019-11-16 NOTE — Progress Notes (Signed)
Botulinum Toxin and Filler Injection Procedure Note  Procedure: Cosmetic botulinum toxin and Filler administration  Pre-operative Diagnosis: Dynamic rhytides and midface volume loss  Post-operative Diagnosis: Same  Complications:  None  Brief history: The patient desires botulinum toxin injection of her forehead. I discussed with the patient this proposed procedure of botulinum toxin injections, which is customized depending on the particular needs of the patient. It is performed on facial rhytids as a temporary correction. The alternatives were discussed with the patient. The risks were addressed including bleeding, scarring, infection, damage to deeper structures, asymmetry, and chronic pain, which may occur infrequently after a procedure. The individual's choice to undergo a surgical procedure is based on the comparison of risks to potential benefits. Other risks include unsatisfactory results, brow ptosis, eyelid ptosis, allergic reaction, temporary paralysis, which should go away with time, bruising, blurring disturbances and delayed healing. Botulinum toxin injections do not arrest the aging process or produce permanent tightening of the eyelid.  Operative intervention maybe necessary to maintain the results of a blepharoplasty or botulinum toxin. The patient understands and wishes to proceed.  Procedure: The area was prepped with alcohol and dried with a clean gauze. Using a clean technique, the botulinum toxin was diluted with 1.25 cc of preservative-free normal saline which was slowly injected with an 18 gauge needle in a tuberculin syringes.  A 32 gauge needles were then used to inject the botulinum toxin. This mixture allow for an aliquot of 5 units per 0.1 cc in each injection site.    Subsequently the mixture was injected in the glabellar and forehead area with preservation of the temporal branch to the lateral eyebrow as well as into each lateral canthal area beginning from the lateral  orbital rim medial to the zygomaticus major in 3 separate areas. A total of 20 Units of botulinum toxin was used. The forehead and glabellar area was injected with care to inject intramuscular only while holding pressure on the supratrochlear vessels in each area during each injection on either side of the medial corrugators. The injection proceeded vertically superiorly to the medial 2/3 of the frontalis muscle and superior 2/3 of the lateral frontalis, again with preservation of the frontal branch.  The midface area was injected at the 3 sub-regions of the mid-face: zygomaticomalar region, anteromedial cheek region, and submalar region for a total of one syringe on each side of the face. The technique used was serial puncture with equal injections in the 3 sub-regions: the zygomaticomalar region, the anteromedial cheek, and the submalar region.  No complications were noted. Light pressure was held for 5 minutes. She was instructed explicitly in post-operative care.  Botox LOT:  [id number] EXP:  10/23  J. Reuel Boom LOT: [id number] EXP: 2020-05-02

## 2019-11-17 ENCOUNTER — Telehealth: Payer: Self-pay | Admitting: *Deleted

## 2019-11-17 NOTE — Telephone Encounter (Signed)
Received Prior Authorization Request from [facility] via of fax for the Lidocaine-Prilocaine 2.5-2.  Per provider:Requested the prescription to be filled without insurance if less than $10.00.//AB/CMA  PA faxed and confirmation received.//AB/CMA

## 2019-11-28 DIAGNOSIS — J40 Bronchitis, not specified as acute or chronic: Secondary | ICD-10-CM | POA: Diagnosis not present

## 2019-12-09 DIAGNOSIS — D692 Other nonthrombocytopenic purpura: Secondary | ICD-10-CM | POA: Diagnosis not present

## 2019-12-09 DIAGNOSIS — C44319 Basal cell carcinoma of skin of other parts of face: Secondary | ICD-10-CM | POA: Diagnosis not present

## 2019-12-09 DIAGNOSIS — D485 Neoplasm of uncertain behavior of skin: Secondary | ICD-10-CM | POA: Diagnosis not present

## 2019-12-09 DIAGNOSIS — C44311 Basal cell carcinoma of skin of nose: Secondary | ICD-10-CM | POA: Diagnosis not present

## 2019-12-09 DIAGNOSIS — D225 Melanocytic nevi of trunk: Secondary | ICD-10-CM | POA: Diagnosis not present

## 2019-12-09 DIAGNOSIS — L570 Actinic keratosis: Secondary | ICD-10-CM | POA: Diagnosis not present

## 2019-12-09 DIAGNOSIS — L905 Scar conditions and fibrosis of skin: Secondary | ICD-10-CM | POA: Diagnosis not present

## 2019-12-09 DIAGNOSIS — L821 Other seborrheic keratosis: Secondary | ICD-10-CM | POA: Diagnosis not present

## 2019-12-09 DIAGNOSIS — L820 Inflamed seborrheic keratosis: Secondary | ICD-10-CM | POA: Diagnosis not present

## 2019-12-09 DIAGNOSIS — D2271 Melanocytic nevi of right lower limb, including hip: Secondary | ICD-10-CM | POA: Diagnosis not present

## 2019-12-09 DIAGNOSIS — C44519 Basal cell carcinoma of skin of other part of trunk: Secondary | ICD-10-CM | POA: Diagnosis not present

## 2019-12-15 ENCOUNTER — Ambulatory Visit (INDEPENDENT_AMBULATORY_CARE_PROVIDER_SITE_OTHER): Payer: Self-pay | Admitting: Plastic Surgery

## 2019-12-15 ENCOUNTER — Encounter: Payer: Self-pay | Admitting: Plastic Surgery

## 2019-12-15 ENCOUNTER — Other Ambulatory Visit: Payer: Self-pay

## 2019-12-15 VITALS — BP 138/73 | HR 58 | Temp 97.3°F

## 2019-12-15 DIAGNOSIS — Z719 Counseling, unspecified: Secondary | ICD-10-CM

## 2019-12-15 NOTE — Progress Notes (Signed)
Filler Injection Procedure Note  Procedure:  Filler administration  Pre-operative Diagnosis: Rytides of face  Post-operative Diagnosis: Same  Complications:  None  Brief history: The patient desires injection with fillers in the face. I discussed with the patient this proposed procedure of filler injections, which is customized depending on the particular needs of the patient. It is performed on facial volume loss as a temporary correction. The alternatives were discussed with the patient. The risks were addressed including bleeding, scarring, infection, damage to deeper structures, asymmetry, and chronic pain, which may occur infrequently after a procedure. The individual's choice to undergo a surgical procedure is based on the comparison of risks to potential benefits. Other risks include unsatisfactory results, allergic reaction, which should go away with time, bruising and delayed healing. Fillers do not arrest the aging process or produce permanent tightening.  Operative intervention maybe necessary to maintain the results. The patient understands and wishes to proceed. An informed consent was signed and informational brochures given to her prior to the procedure.  Procedure: The area was prepped with chlorhexidine and dried with a clean gauze. Using a clean technique, a 30 gauge needle was then used to inject the filler into the bilateral nasal labial folds and perioral wrinkles. This was done with one syringe.   No complications were noted. Light pressure was held for 5 minutes. She was instructed explicitly in post-operative care.  Juvederm Ultra Plus XC 1.0 ml x two LOT: [id number] and [id number] EXP: 2019-12-31 and 2020-04-04  Juvederm Ultra Plus XC 0.55 ml LOT: [id number] EXP: 2019-12-31

## 2020-01-13 DIAGNOSIS — Z85828 Personal history of other malignant neoplasm of skin: Secondary | ICD-10-CM | POA: Diagnosis not present

## 2020-01-13 DIAGNOSIS — C44319 Basal cell carcinoma of skin of other parts of face: Secondary | ICD-10-CM | POA: Diagnosis not present

## 2020-01-15 ENCOUNTER — Other Ambulatory Visit: Payer: Self-pay | Admitting: Family Medicine

## 2020-01-15 NOTE — Telephone Encounter (Signed)
LAST APPOINTMENT DATE: cpe 02/19/20 told to make 1 yr cpe    NEXT APPOINTMENT DATE: Visit date not found    LAST REFILL: 07/17/2019  QTY: 15 1 rf  Have changed script to no refills and put note for patient to call for app for cpe

## 2020-01-20 DIAGNOSIS — C44311 Basal cell carcinoma of skin of nose: Secondary | ICD-10-CM | POA: Diagnosis not present

## 2020-01-20 DIAGNOSIS — Z85828 Personal history of other malignant neoplasm of skin: Secondary | ICD-10-CM | POA: Diagnosis not present

## 2020-01-29 ENCOUNTER — Encounter: Payer: Self-pay | Admitting: Family Medicine

## 2020-02-01 DIAGNOSIS — D485 Neoplasm of uncertain behavior of skin: Secondary | ICD-10-CM | POA: Diagnosis not present

## 2020-02-01 DIAGNOSIS — C44212 Basal cell carcinoma of skin of right ear and external auricular canal: Secondary | ICD-10-CM | POA: Diagnosis not present

## 2020-02-16 DIAGNOSIS — H5203 Hypermetropia, bilateral: Secondary | ICD-10-CM | POA: Diagnosis not present

## 2020-02-16 DIAGNOSIS — H52203 Unspecified astigmatism, bilateral: Secondary | ICD-10-CM | POA: Diagnosis not present

## 2020-02-16 DIAGNOSIS — H31002 Unspecified chorioretinal scars, left eye: Secondary | ICD-10-CM | POA: Diagnosis not present

## 2020-02-23 DIAGNOSIS — C44212 Basal cell carcinoma of skin of right ear and external auricular canal: Secondary | ICD-10-CM | POA: Diagnosis not present

## 2020-02-23 DIAGNOSIS — Z85828 Personal history of other malignant neoplasm of skin: Secondary | ICD-10-CM | POA: Diagnosis not present

## 2020-03-22 ENCOUNTER — Telehealth: Payer: Self-pay | Admitting: Family Medicine

## 2020-03-22 NOTE — Telephone Encounter (Signed)
Left message for patient to call back and schedule Medicare Annual Wellness Visit (AWV) either virtually/audio only OR in office. Whatever the patients preference is.  Last AWV 02/19/19; please schedule at anytime with [facility]-Nurse Health Advisor at [facility].  This should be a 45 minute visit.

## 2020-04-12 ENCOUNTER — Other Ambulatory Visit: Payer: Self-pay | Admitting: Cardiovascular Disease

## 2020-04-12 DIAGNOSIS — I4891 Unspecified atrial fibrillation: Secondary | ICD-10-CM

## 2020-04-12 DIAGNOSIS — Z5181 Encounter for therapeutic drug level monitoring: Secondary | ICD-10-CM

## 2020-04-12 NOTE — Telephone Encounter (Signed)
Pt last saw Dr Angelena Form 08/14/19, last labs 02/17/19 Creat 1.04, overdue for labwork.  Age 85, weight 97.5kg, based on specified criteria pt is ion appropriate dosage of Eliquis 5mg  BID.  Needs appt for labwork.

## 2020-04-12 NOTE — Telephone Encounter (Signed)
Attempted to contact pt to schedule lab appt, LMOM TCB.  Future orders in Epic for CBC and BMP, will schedule once pt calls back.

## 2020-04-15 ENCOUNTER — Other Ambulatory Visit: Payer: Self-pay

## 2020-04-15 ENCOUNTER — Other Ambulatory Visit: Payer: Medicare HMO | Admitting: *Deleted

## 2020-04-15 DIAGNOSIS — Z5181 Encounter for therapeutic drug level monitoring: Secondary | ICD-10-CM | POA: Diagnosis not present

## 2020-04-15 DIAGNOSIS — I4891 Unspecified atrial fibrillation: Secondary | ICD-10-CM

## 2020-04-15 NOTE — Telephone Encounter (Signed)
Pt scheduled for labs. He has enough to get to Monday. He will come in today for labs

## 2020-04-15 NOTE — Telephone Encounter (Signed)
*  STAT* If patient is at the pharmacy, call can be transferred to refill team.   1. Which medications need to be refilled? (please list name of each medication and dose if known) Eliquis   2. Which pharmacy/location (including street and city if local pharmacy) is medication to be sent to? [facility]- [phone]  3. Do they need a 30 day or 90 day supply? *#180 and refills

## 2020-04-16 LAB — CBC
Hematocrit: 45 % (ref 37.5–51.0)
Hemoglobin: 15.4 g/dL (ref 13.0–17.7)
MCH: 31.5 pg (ref 26.6–33.0)
MCHC: 34.2 g/dL (ref 31.5–35.7)
MCV: 92 fL (ref 79–97)
Platelets: 223 10*3/uL (ref 150–450)
RBC: 4.89 x10E6/uL (ref 4.14–5.80)
RDW: 12.9 % (ref 11.6–15.4)
WBC: 7.5 10*3/uL (ref 3.4–10.8)

## 2020-04-16 LAB — BASIC METABOLIC PANEL
BUN/Creatinine Ratio: 12 (ref 10–24)
BUN: 13 mg/dL (ref 8–27)
CO2: 24 mmol/L (ref 20–29)
Calcium: 9.5 mg/dL (ref 8.6–10.2)
Chloride: 107 mmol/L — ABNORMAL HIGH (ref 96–106)
Creatinine, Ser: 1.05 mg/dL (ref 0.76–1.27)
GFR calc Af Amer: 76 mL/min/{1.73_m2} (ref >59)
GFR calc non Af Amer: 65 mL/min/{1.73_m2} (ref >59)
Glucose: 84 mg/dL (ref 65–99)
Potassium: 4.4 mmol/L (ref 3.5–5.2)
Sodium: 145 mmol/L — ABNORMAL HIGH (ref 134–144)

## 2020-05-12 DIAGNOSIS — R69 Illness, unspecified: Secondary | ICD-10-CM | POA: Diagnosis not present

## 2020-06-20 ENCOUNTER — Telehealth: Payer: Self-pay | Admitting: Cardiovascular Disease

## 2020-06-20 DIAGNOSIS — I2510 Atherosclerotic heart disease of native coronary artery without angina pectoris: Secondary | ICD-10-CM

## 2020-06-20 NOTE — Telephone Encounter (Signed)
  Patient has made a follow up appointment on 08/10/20 and he would like to come before that appointment to do any blood work that needs to be done. I did not see any orders for labs. Can orders be placed for any that need to be done so patient can schedule appointment?

## 2020-06-21 NOTE — Telephone Encounter (Signed)
Placed orders for Lipids/Liver to be drawn prior to next appointment. Message sent to scheduling to make appointment.

## 2020-06-24 DIAGNOSIS — Z1159 Encounter for screening for other viral diseases: Secondary | ICD-10-CM | POA: Diagnosis not present

## 2020-08-03 ENCOUNTER — Other Ambulatory Visit: Payer: Medicare HMO | Admitting: *Deleted

## 2020-08-03 ENCOUNTER — Other Ambulatory Visit: Payer: Self-pay

## 2020-08-03 DIAGNOSIS — I2510 Atherosclerotic heart disease of native coronary artery without angina pectoris: Secondary | ICD-10-CM | POA: Diagnosis not present

## 2020-08-03 LAB — LIPID PANEL
Chol/HDL Ratio: 2.4 ratio (ref 0.0–5.0)
Cholesterol, Total: 116 mg/dL (ref 100–199)
HDL: 49 mg/dL (ref >39)
LDL Chol Calc (NIH): 55 mg/dL (ref 0–99)
Triglycerides: 55 mg/dL (ref 0–149)
VLDL Cholesterol Cal: 12 mg/dL (ref 5–40)

## 2020-08-03 LAB — HEPATIC FUNCTION PANEL
ALT: 24 IU/L (ref 0–44)
AST: 19 IU/L (ref 0–40)
Albumin: 4.1 g/dL (ref 3.6–4.6)
Alkaline Phosphatase: 68 IU/L (ref 44–121)
Bilirubin Total: 0.7 mg/dL (ref 0.0–1.2)
Bilirubin, Direct: 0.21 mg/dL (ref 0.00–0.40)
Total Protein: 6.2 g/dL (ref 6.0–8.5)

## 2020-08-03 LAB — DRAW FEE (FINGERSTICK)

## 2020-08-03 NOTE — Progress Notes (Deleted)
Cardiology Office Note    Date:  08/03/2020   ID:  Luis [name], DOB Aug 04, 1934, MRN [id number]   PCP:  Vivi Barrack, [place]  Cardiologist:  Lauree Chandler, MD *** Advanced Practice Provider:  No care team member to display Electrophysiologist:  None   [phone]   No chief complaint on file.   History of Present Illness:  Luis [name] is a 86 y.o. male with history of CAD status post BMS to the RCA, BMS to the circumflex and PTCA of the OM1 in 2001, overlapping DES to the RCA and PTCA of the small circumflex 2013.  Last cath 2016 for dyspnea patent stents with normal LV function medical therapy recommended TIA and found to be in atrial fibrillation 2016 treated with atenolol and Eliquis.  Also has hypertension, HLD, RBBB.  Patient saw Dr. Angelena Form 08/14/19 and was doing well.      Past Medical History:  Diagnosis Date  . CAD (coronary artery disease)    a. BMS-RCA, BMS-Cx, PTCA of OM1 in 2001. b. overlapping DES in RCA and PTCA to small distal Cx 2013. c. Cath 2016 -> stable CAD, suspect dyspnea unrelated to coronary disease.  Marland Kitchen COLONIC POLYPS, HX OF    Dr Earlean Shawl  . DJD (degenerative joint disease)   . Essential hypertension   . Hyperlipidemia   . Myocardial infarction ([place]) 08/1999  . Persistent atrial fibrillation ([place])    a. dx 2016.  Marland Kitchen RBBB   . TIA (transient ischemic attack)     Past Surgical History:  Procedure Laterality Date  . CARDIAC CATHETERIZATION  ~ 2002  . CARDIAC CATHETERIZATION N/A 03/02/2015   Procedure: Left Heart Cath and Coronary Angiography;  Surgeon: Sherren Mocha, MD;  Location: [facility];  Service: Cardiovascular;  Laterality: N/A;  . CATARACT EXTRACTION W/ INTRAOCULAR LENS  IMPLANT, BILATERAL  ~ 2007   bilaterally  . CORONARY ANGIOPLASTY  07/30/11  . CORONARY ANGIOPLASTY WITH STENT PLACEMENT  2001; 07/30/11;   "2+3"  . INGUINAL HERNIA REPAIR  2005   left  . LEFT HEART  CATHETERIZATION WITH CORONARY ANGIOGRAM N/A 07/30/2011   Procedure: LEFT HEART CATHETERIZATION WITH CORONARY ANGIOGRAM;  Surgeon: Burnell Blanks, MD;  Location: [facility] CATH LAB;  Service: Cardiovascular;  Laterality: N/A;  . PERCUTANEOUS CORONARY STENT INTERVENTION (PCI-S)  07/30/2011   Procedure: PERCUTANEOUS CORONARY STENT INTERVENTION (PCI-S);  Surgeon: Burnell Blanks, MD;  Location: [facility] CATH LAB;  Service: Cardiovascular;;  . RETINAL DETACHMENT SURGERY  05/2008; 06/2008; 07/2008   left; "all 3 surgeries have failed"    Current Medications: No outpatient medications have been marked as taking for the 08/10/20 encounter (Appointment) with Imogene Burn, PA-C.     Allergies:   Patient has no known allergies.   Social History   Socioeconomic History  . Marital status: Married    Spouse name: Not on file  . Number of children: Not on file  . Years of education: Not on file  . Highest education level: Not on file  Occupational History  . Not on file  Tobacco Use  . Smoking status: Former Smoker    Packs/day: 0.50    Years: 40.00    Pack years: 20.00    Types: Cigarettes    Quit date: 06/12/1999    Years since quitting: 21.1  . Smokeless tobacco: Never Used  Vaping Use  . Vaping Use: Never used  Substance and Sexual Activity  . 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 Drug use: No  . Sexual activity: Not Currently  Other Topics Concern  . Not on file  Social History Narrative  . Not on file   Social Determinants of Health   Financial Resource Strain: Not on file  Food Insecurity: Not on file  Transportation Needs: Not on file  Physical Activity: Not on file  Stress: Not on file  Social Connections: Not on file     Family History:  The patient's ***family history includes Alcohol abuse in his mother; Colon cancer in his father; Prostate cancer in his father.   ROS:   Please see the history of present illness.    ROS All  other systems reviewed and are negative.   PHYSICAL EXAM:   VS:  There were no vitals taken for this visit.  Physical Exam  GEN: Well nourished, well developed, in no acute distress  HEENT: normal  Neck: no JVD, carotid bruits, or masses Cardiac:RRR; no murmurs, rubs, or gallops  Respiratory:  clear to auscultation bilaterally, normal work of breathing GI: soft, nontender, nondistended, + BS Ext: without cyanosis, clubbing, or edema, Good distal pulses bilaterally MS: no deformity or atrophy  Skin: warm and dry, no rash Neuro:  Alert and Oriented x 3, Strength and sensation are intact Psych: euthymic mood, full affect  Wt Readings from Last 3 Encounters:  11/16/19 215 lb (97.5 kg)  08/14/19 214 lb 12.8 oz (97.4 kg)  02/19/19 213 lb (96.6 kg)      Studies/Labs Reviewed:   EKG:  EKG is*** ordered today.  The ekg ordered today demonstrates ***  Recent Labs: 04/15/2020: BUN 13; Creatinine, Ser 1.05; Hemoglobin 15.4; Platelets 223; Potassium 4.4; Sodium 145   Lipid Panel    Component Value Date/Time   CHOL 156 02/17/2019 1359   CHOL 126 05/15/2017 0807   TRIG 60.0 02/17/2019 1359   TRIG 80 05/07/2006 1036   HDL 59.90 02/17/2019 1359   HDL 52 05/15/2017 0807   CHOLHDL 3 02/17/2019 1359   VLDL 12.0 02/17/2019 1359   LDLCALC 84 02/17/2019 1359   LDLCALC 61 05/15/2017 0807    Additional studies/ records that were reviewed today include:  Cardiac cath 03/02/15:  Prox RCA lesion, 20% stenosed. The lesion was previously treated with a drug-eluting stent .  Dist RCA lesion, 10% stenosed. The lesion was previously treated with a drug-eluting stent .  Mid RCA lesion, 30% stenosed.  Mid Cx lesion, 50% stenosed. The lesion was previously treated with a stent (unknown type) .  3rd Mrg lesion, 90% stenosed.  2nd Mrg lesion, 70% stenosed.  Prox LAD lesion, 30% stenosed.  Mid LAD lesion, 30% stenosed.  The left ventricular systolic function is normal.   1. Stable CAD with  continued patency of the stented segments in the RCA, mild stenosis of the LCx stent, Mild diffuse nonobstructive stenosis of the LAD, and severe distal stenosis of small OM subranches 2. Normal LV function   Echo June 2016: Left ventricle: The cavity size was normal. There was mild   concentric hypertrophy. Systolic function was vigorous. The   estimated ejection fraction was in the range of 65% to 70%. Wall   motion was normal; there were no regional wall motion   abnormalities. There was no evidence of elevated ventricular   filling pressure by Doppler parameters.   Impressions:   - No cardiac source of emboli was indentified.      Risk  Assessment/Calculations:   {Does this patient have ATRIAL FIBRILLATION?:[phone]}     ASSESSMENT:    No diagnosis found.   PLAN:  In order of problems listed above:   CAD without angina: No chest pain. Last cardiac cath September 2016 with stable CAD. Continue statin, Zetia and beta blocker. He is not on ASA or Plavix since he is on Eliquis for atrial fib.       Carotid artery disease: Mild disease by dopplers in 2019. Repeat in 2022.      HTN: BP is mildly elevated and has been elevated at home. Will start Norvasc 5 mg daily and continue atenolol 25 mg daily. Cannot increase atenolol due to bradycardia.     HLD: LDL near goal in August 2020. Continue statin and Zetia.     Atrial fibrillation, persistent: Rate controlled atrial fib today. Continue atenolol and Eliquis.       Shared Decision Making/Informed Consent   {Are you ordering a CV Procedure (e.g. stress test, cath, DCCV, TEE, etc)?   Press F2        :[id number]}    Medication Adjustments/Labs and Tests Ordered: Current medicines are reviewed at length with the patient today.  Concerns regarding medicines are outlined above.  Medication changes, Labs and Tests ordered today are listed in the Patient Instructions below. There are no Patient Instructions on file for this  visit.   Sumner Boast, PA-C  08/03/2020 3:44 PM    [facility] [address], [place], [place]  [address] Phone: [phone]; Fax: [phone]

## 2020-08-10 ENCOUNTER — Ambulatory Visit: Payer: Medicare HMO | Admitting: Physician Assistant

## 2020-08-10 DIAGNOSIS — I2510 Atherosclerotic heart disease of native coronary artery without angina pectoris: Secondary | ICD-10-CM

## 2020-08-10 DIAGNOSIS — I779 Disorder of arteries and arterioles, unspecified: Secondary | ICD-10-CM

## 2020-08-10 DIAGNOSIS — E785 Hyperlipidemia, unspecified: Secondary | ICD-10-CM

## 2020-08-10 DIAGNOSIS — I480 Paroxysmal atrial fibrillation: Secondary | ICD-10-CM

## 2020-08-10 DIAGNOSIS — I10 Essential (primary) hypertension: Secondary | ICD-10-CM

## 2020-09-13 DIAGNOSIS — S52514A Nondisplaced fracture of right radial styloid process, initial encounter for closed fracture: Secondary | ICD-10-CM | POA: Diagnosis not present

## 2020-09-13 DIAGNOSIS — M25532 Pain in left wrist: Secondary | ICD-10-CM | POA: Diagnosis not present

## 2020-09-21 DIAGNOSIS — M13841 Other specified arthritis, right hand: Secondary | ICD-10-CM | POA: Diagnosis not present

## 2020-09-21 DIAGNOSIS — S52501D Unspecified fracture of the lower end of right radius, subsequent encounter for closed fracture with routine healing: Secondary | ICD-10-CM | POA: Diagnosis not present

## 2020-09-21 DIAGNOSIS — M25531 Pain in right wrist: Secondary | ICD-10-CM | POA: Diagnosis not present

## 2020-09-26 ENCOUNTER — Other Ambulatory Visit: Payer: Self-pay | Admitting: Cardiovascular Disease

## 2020-10-17 ENCOUNTER — Other Ambulatory Visit: Payer: Self-pay | Admitting: Family Medicine

## 2020-10-17 ENCOUNTER — Other Ambulatory Visit: Payer: Self-pay | Admitting: Cardiovascular Disease

## 2020-10-17 NOTE — Telephone Encounter (Signed)
15 tablets sent in. Please let pt know he needs appointment for refills.  Algis Greenhouse. Jerline Pain, MD 10/17/2020 12:57 PM

## 2020-10-25 ENCOUNTER — Other Ambulatory Visit: Payer: Self-pay | Admitting: Physician Assistant

## 2020-10-25 ENCOUNTER — Other Ambulatory Visit: Payer: Self-pay | Admitting: Cardiovascular Disease

## 2020-10-25 NOTE — Telephone Encounter (Signed)
Prescription refill request for Eliquis received. Indication: afib  Last office visit: [place] , 08/14/2019 Scr: 1.05, 04/15/2020 Age: 86 yo   Weight: 97.5 kg   Pt is on the correct dose of Eliquis per dosing criteria. Prescription refill sent for Eliquis 5mg  BID.   Pt is overdue for an office visit but has appt scheudled for 6/16

## 2020-10-31 ENCOUNTER — Other Ambulatory Visit: Payer: Self-pay | Admitting: Cardiovascular Disease

## 2020-11-23 NOTE — Progress Notes (Signed)
Office Visit    Patient Name: Luis [name] Date of Encounter: 11/24/2020  PCP:  Vivi Barrack, MD   [place]  Cardiologist:  Lauree Chandler, MD  Advanced Practice Provider:  No care team member to display Electrophysiologist:  None   Chief Complaint    Luis [name] is a 86 y.o. male with a hx of CAD, HTN, HLD, TIA, mild carotid artery disease, persistent atrial fibrillation presents today for follow up of CAD.   Past Medical History    Past Medical History:  Diagnosis Date   CAD (coronary artery disease)    a. BMS-RCA, BMS-Cx, PTCA of OM1 in 2001. b. overlapping DES in RCA and PTCA to small distal Cx 2013. c. Cath 2016 -> stable CAD, suspect dyspnea unrelated to coronary disease.   COLONIC POLYPS, HX OF    Dr Earlean Shawl   DJD (degenerative joint disease)    Essential hypertension    Hyperlipidemia    Myocardial infarction ([place]) 08/1999   Persistent atrial fibrillation ([place])    a. dx 2016.   RBBB    TIA (transient ischemic attack)    Past Surgical History:  Procedure Laterality Date   CARDIAC CATHETERIZATION  ~ 2002   CARDIAC CATHETERIZATION N/A 03/02/2015   Procedure: Left Heart Cath and Coronary Angiography;  Surgeon: Sherren Mocha, MD;  Location: [facility];  Service: Cardiovascular;  Laterality: N/A;   CATARACT EXTRACTION W/ INTRAOCULAR LENS  IMPLANT, BILATERAL  ~ 2007   bilaterally   CORONARY ANGIOPLASTY  07/30/11   CORONARY ANGIOPLASTY WITH STENT PLACEMENT  2001; 07/30/11;   "2+3"   INGUINAL HERNIA REPAIR  2005   left   LEFT HEART CATHETERIZATION WITH CORONARY ANGIOGRAM N/A 07/30/2011   Procedure: LEFT HEART CATHETERIZATION WITH CORONARY ANGIOGRAM;  Surgeon: Burnell Blanks, MD;  Location: [facility] CATH LAB;  Service: Cardiovascular;  Laterality: N/A;   PERCUTANEOUS CORONARY STENT INTERVENTION (PCI-S)  07/30/2011   Procedure: PERCUTANEOUS CORONARY STENT INTERVENTION (PCI-S);  Surgeon: Burnell Blanks, MD;   Location: [facility] CATH LAB;  Service: Cardiovascular;;   RETINAL DETACHMENT SURGERY  05/2008; 06/2008; 07/2008   left; "all 3 surgeries have failed"    Allergies  No Known Allergies  History of Present Illness    Luis [name] is a 86 y.o. male with a hx of CAD, HTN, HLD, prior TIA, mild carotid artery disease, persistent atrial fibrillation last seen 08/14/19 by Dr. Angelena Form.  Previous cardiac catheterization in 2001 with BMS to RCA and BMS to Cx along with balloon angioplasty of the ostium of the first obtuse marginal. Cardiac cath 07/2011 with placement of 2 non-overlapping DES to RCA and severe disease in small distal Cx treated with balloon angioplasty. Admitted 11/2014 with TIA type symptoms found to be in atrial fibrillation. 48-hour monitor 11/2014 with persistent atrial fibrillation, rare PVC. Echocardiogram 11/2014 with normal LV size and function, no significant valvular abnormalities. Cardiac cath 02/2015 due to worsening dyspnea with stable CAD. Carotid doppler 2019 mild bilateral disease.   When last seen 08/14/19 he was doing well with no cardiac symptoms.  He has retired since last seen. 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 He also enjoys fishing.  Reports no chest pain, pressure, tightness.  Reports occasional dyspnea on exertion which is overall improving.  Tells me he rarely will get  orthopnea and no PND. Checks his heart rate and BP at home with readings 115/72. Reports no lightheadedness or dizziness. No near syncope nor syncope.  Overall his chief complaint is that he is somewhat bored with retirement and helpful to pick up some more hobbies.  Does note some tiredness and fatigue.  We discussed that this could be related to his low heart rate.  EKGs/Labs/Other Studies Reviewed:   The following studies were reviewed today:  EKG:  EKG is  ordered today.  The ekg ordered today demonstrates atrial fibrillation 48bpm.   Recent Labs: 04/15/2020: BUN 13; Creatinine, Ser 1.05; Hemoglobin 15.4; Platelets 223; Potassium 4.4; Sodium 145 08/03/2020: ALT 24  Recent Lipid Panel    Component Value Date/Time   CHOL 116 08/03/2020 0839   TRIG 55 08/03/2020 0839   TRIG 80 05/07/2006 1036   HDL 49 08/03/2020 0839   CHOLHDL 2.4 08/03/2020 0839   CHOLHDL 3 02/17/2019 1359   VLDL 12.0 02/17/2019 1359   LDLCALC 55 08/03/2020 0839    Risk Assessment/Calculations:   CHA2DS2-VASc Score = 4  This indicates a 4.8% annual risk of stroke. The patient's score is based upon: CHF History: No HTN History: Yes Diabetes History: No Stroke History: No Vascular Disease History: Yes Age Score: 2 Gender Score: 0   Home Medications   Current Meds  Medication Sig   amLODipine (NORVASC) 5 MG tablet TAKE ONE TABLET EACH DAY   atenolol (TENORMIN) 25 MG tablet TAKE ONE TABLET EACH DAY   atorvastatin (LIPITOR) 80 MG tablet Take 1 tablet (80 mg total) by mouth daily. Please schedule appt for future refills. 1st attempt   ELIQUIS 5 MG TABS tablet TAKE ONE TABLET TWICE DAILY   ezetimibe (ZETIA) 10 MG tablet TAKE ONE TABLET EACH DAY AT BEDTIME   folic acid (FOLVITE) 1 MG tablet Take by mouth.   Glucosamine HCl 1000 MG TABS Take 1 tablet by mouth daily.    MELATONIN PO Take 1 tablet by mouth as needed.    Misc Natural Products (BLACK CHERRY CONCENTRATE PO) Take by mouth.   nitroGLYCERIN (NITROSTAT) 0.4 MG SL tablet DISSOLVE 1 TABLET UNDER TONGUE AS NEEDED FOR CHEST PAIN,MAY REPEAT IN5 MINUTES FOR 2 DOSES.   NON FORMULARY    Potassium Aminobenzoate 500 MG TABS Take 1 tablet by mouth daily.   sertraline (ZOLOFT) 100 MG tablet Take 1 tablet (100 mg total) by mouth daily.   vitamin E 1000 UNIT capsule Take 2,000 Units by mouth daily.   zolpidem (AMBIEN) 5 MG tablet TAKE ONE TABLET AT BEDTIME IF NEEDED FORSLEEP     Review of Systems    All other systems  reviewed and are otherwise negative except as noted above.  Physical Exam    VS:  BP 110/72   Pulse (!) 48   Ht 6' (1.829 m)   Wt 202 lb 9.6 oz (91.9 kg)   SpO2 95%   BMI 27.48 kg/m  , BMI Body mass index is 27.48 kg/m.  Wt Readings from Last 3 Encounters:  11/24/20 202 lb 9.6 oz (91.9 kg)  11/16/19 215 lb (97.5 kg)  08/14/19 214 lb 12.8 oz (97.4 kg)     GEN: Well nourished, well developed, in no acute distress. HEENT: normal. Neck: Supple, no JVD, carotid bruits, or masses. Cardiac: bradycardia, irregularly irregular, no murmurs, rubs, or gallops. No clubbing, cyanosis, edema.  Radials//PT 2+ and equal bilaterally.  Respiratory:  Respirations regular and unlabored, clear to auscultation bilaterally. GI: Soft, nontender, nondistended. MS:  No deformity or atrophy. Skin: Warm and dry, no rash. Neuro:  Strength and sensation are intact. Psych: Normal affect.  Assessment & Plan    CAD - stable with no anginal symptoms.  No indication for ischemic evaluation.  EKG today with no acute ST/T wave changes.  GDMT includes atenolol, Zetia, atorvastatin.  No aspirin secondary to chronic anticoagulation. Heart healthy diet and regular cardiovascular exercise encouraged.   Carotid artery disease  - Continue statin, zetia. No aspirin due to chronic anticoagulation.  Denies amaurosis fugax.  No negation for repeat testing at this time.  HTN - BP well controlled. Continue current antihypertensive regimen.   HLD, LDL goal <70 - 07/2020 LDL 55, normal liver function. Continue Atorvastatin 80mg  dialy and Zetia 10mg  daily.  Persistent atrial fibrillation / Chronic anticoagulation -EKG today shows atrial fibrillation 48 bpm.  Endorses some tiredness and fatigue which is likely related to bradycardia.  No lightheadedness, dizziness, near ischemia, syncope.  No indication for ZIO monitoring at this time.  He was educated to report these symptoms.  Reduce atenolol to 12.5 mg daily due to bradycardia.   Continue Eliquis 5 mg twice daily.  Does not meet dose reduction criteria.  Denies bleeding complications.  Disposition: Follow up in 1 year(s) with Dr. Angelena Form or APP   Signed, Loel Dubonnet, NP 11/24/2020, 10:13 AM [place]

## 2020-11-24 ENCOUNTER — Encounter: Payer: Self-pay | Admitting: Family

## 2020-11-24 ENCOUNTER — Ambulatory Visit: Payer: Medicare HMO | Admitting: Family

## 2020-11-24 ENCOUNTER — Other Ambulatory Visit: Payer: Self-pay

## 2020-11-24 VITALS — BP 110/72 | HR 48 | Ht 72.0 in | Wt 202.6 lb

## 2020-11-24 DIAGNOSIS — Z7901 Long term (current) use of anticoagulants: Secondary | ICD-10-CM

## 2020-11-24 DIAGNOSIS — I4819 Other persistent atrial fibrillation: Secondary | ICD-10-CM | POA: Diagnosis not present

## 2020-11-24 DIAGNOSIS — I10 Essential (primary) hypertension: Secondary | ICD-10-CM | POA: Diagnosis not present

## 2020-11-24 DIAGNOSIS — E782 Mixed hyperlipidemia: Secondary | ICD-10-CM | POA: Diagnosis not present

## 2020-11-24 MED ORDER — ATENOLOL 25 MG PO TABS: 12.5000 mg | ORAL_TABLET | Freq: Every day | ORAL | 3 refills | Status: DC

## 2020-11-24 NOTE — Patient Instructions (Signed)
Medication Instructions:  Your physician has recommended you make the following change in your medication:  REDUCE Atenolol to 12.5mg  (half tablet) once per day  *If you need a refill on your cardiac medications before your next appointment, please call your pharmacy*   Lab Work: None ordered today.  Testing/Procedures: Your EKG today showed atrial fibrillation with a rate of 48bpm.   Follow-Up: At [facility], you and your health needs are our priority.  As part of our continuing mission to provide you with exceptional heart care, we have created designated Provider Care Teams.  These Care Teams include your primary Cardiologist (physician) and Advanced Practice Providers (APPs -  Physician Assistants and Nurse Practitioners) who all work together to provide you with the care you need, when you need it.  We recommend signing up for the patient portal called "MyChart".  Sign up information is provided on this After Visit Summary.  MyChart is used to connect with patients for Virtual Visits (Telemedicine).  Patients are able to view lab/test results, encounter notes, upcoming appointments, etc.  Non-urgent messages can be sent to your provider as well.   To learn more about what you can do with MyChart, go to [email or web address].    Your next appointment:   1 year(s)  The format for your next appointment:   In Person  Provider:   You may see Lauree Chandler, MD or one of the following Advanced Practice Providers on your designated Care Team:   Melina Copa, PA-C Ermalinda Barrios, PA-C   Other Instructions  Heart Healthy Diet Recommendations: A low-salt diet is recommended. Meats should be grilled, baked, or boiled. Avoid fried foods. Focus on lean protein sources like fish or chicken with vegetables and fruits. The American Heart Association is a Microbiologist!  American Heart Association Diet and Lifeystyle Recommendations   Exercise recommendations: The American Heart  Association recommends 150 minutes of moderate intensity exercise weekly. Try 30 minutes of moderate intensity exercise 4-5 times per week. This could include walking, jogging, or swimming.

## 2020-11-26 DIAGNOSIS — S2231XA Fracture of one rib, right side, initial encounter for closed fracture: Secondary | ICD-10-CM | POA: Diagnosis not present

## 2020-12-08 DIAGNOSIS — C44519 Basal cell carcinoma of skin of other part of trunk: Secondary | ICD-10-CM | POA: Diagnosis not present

## 2020-12-08 DIAGNOSIS — Z85828 Personal history of other malignant neoplasm of skin: Secondary | ICD-10-CM | POA: Diagnosis not present

## 2020-12-08 DIAGNOSIS — L570 Actinic keratosis: Secondary | ICD-10-CM | POA: Diagnosis not present

## 2020-12-08 DIAGNOSIS — L821 Other seborrheic keratosis: Secondary | ICD-10-CM | POA: Diagnosis not present

## 2020-12-08 DIAGNOSIS — L814 Other melanin hyperpigmentation: Secondary | ICD-10-CM | POA: Diagnosis not present

## 2020-12-09 ENCOUNTER — Telehealth: Payer: Self-pay | Admitting: Cardiovascular Disease

## 2020-12-09 ENCOUNTER — Ambulatory Visit (INDEPENDENT_AMBULATORY_CARE_PROVIDER_SITE_OTHER): Payer: Medicare HMO | Admitting: *Deleted

## 2020-12-09 ENCOUNTER — Ambulatory Visit: Payer: Medicare HMO

## 2020-12-09 ENCOUNTER — Other Ambulatory Visit: Payer: Self-pay

## 2020-12-09 VITALS — BP 142/60 | HR 75 | Wt 209.0 lb

## 2020-12-09 DIAGNOSIS — I4819 Other persistent atrial fibrillation: Secondary | ICD-10-CM

## 2020-12-09 NOTE — Progress Notes (Signed)
Reason for visit: EKG  Name of MD requesting visit: Dr Caryl Comes   H&P:Patient here for EKG after heart rate 30-40's while getting infusion earlier today. Atenolol recently decreased and stopped earlier today  ROS related to problem: Patient without complaints  Assessment and plan per MD: EKG reviewed by Dr Edward Jolly 77- chronic afib.  Per Dr Caryl Comes patient to remain off Atenolol and to wear 3 day zio patch in 2 weeks.    Patient aware to stop Atenolol and that monitor will be mailed to him to wear in 2 weeks.

## 2020-12-09 NOTE — Telephone Encounter (Signed)
STAT if HR is under 50 or over 120 (normal HR is 60-100 beats per minute)  What is your heart rate? 30-40  Do you have a log of your heart rate readings (document readings)? no  Do you have any other symptoms? no

## 2020-12-09 NOTE — Progress Notes (Unsigned)
Enrolled patient for a 3 day Zio XT Monitor to be mailed to patients home on 12/22/20  [place] to read

## 2020-12-09 NOTE — Telephone Encounter (Signed)
HR anywhere from mid 40's as low as 33. Patient did reduce Atenolol as advised at last OV. He is currently seeing Missy Reagan NP. She is asking if he can come have an EKG to verify he has not went into heart block or what do we advise. The patient is just having fatigue, still no lightheadedness, dizziness, or syncope.    DOD Caryl Comes advised to come to office for EKG and stop Atenolol.  Missy will call patient and tell him to come to heart care for an EKG.

## 2021-03-06 ENCOUNTER — Other Ambulatory Visit: Payer: Self-pay | Admitting: Cardiovascular Disease

## 2021-03-20 DIAGNOSIS — H5201 Hypermetropia, right eye: Secondary | ICD-10-CM | POA: Diagnosis not present

## 2021-03-20 DIAGNOSIS — H52201 Unspecified astigmatism, right eye: Secondary | ICD-10-CM | POA: Diagnosis not present

## 2021-03-20 DIAGNOSIS — H31002 Unspecified chorioretinal scars, left eye: Secondary | ICD-10-CM | POA: Diagnosis not present

## 2021-04-25 DIAGNOSIS — J101 Influenza due to other identified influenza virus with other respiratory manifestations: Secondary | ICD-10-CM | POA: Diagnosis not present

## 2021-04-25 DIAGNOSIS — Z20822 Contact with and (suspected) exposure to covid-19: Secondary | ICD-10-CM | POA: Diagnosis not present

## 2021-05-03 ENCOUNTER — Telehealth: Payer: Self-pay

## 2021-05-03 ENCOUNTER — Other Ambulatory Visit: Payer: Self-pay | Admitting: Cardiovascular Disease

## 2021-05-03 DIAGNOSIS — Z5181 Encounter for therapeutic drug level monitoring: Secondary | ICD-10-CM

## 2021-05-03 DIAGNOSIS — I480 Paroxysmal atrial fibrillation: Secondary | ICD-10-CM

## 2021-05-03 NOTE — Telephone Encounter (Addendum)
Prescription refill request for Eliquis received. Indication: Afib  Last office visit:11/24/20 Gilford Rile)  Scr: 1.05 (04/15/20)  Age: 86 Weight: 94.8kg  Pt labs overdue. Pt spoke with Candance, RN and scheduled lab appt for 05/16/21. Pt stated he does not have enough Eliquis to last his until lab appt. 30 day supply sent to pharmacy. Pt verbalized understanding.

## 2021-05-03 NOTE — Telephone Encounter (Signed)
Patient has labs scheduled for cardiology on 12/6.  Patient has CPE scheduled with Dr. Jerline Pain on 12/15.    Patient states the last time he was in to see Dr. Jerline Pain, his PSA was high.  Patient is requesting Dr. Jerline Pain to add an order for a PSA to be drawn at Cardiology.    Patient does not want to wait until CPE to have this lab drawn.

## 2021-05-03 NOTE — Telephone Encounter (Signed)
See note and advise °

## 2021-05-08 ENCOUNTER — Other Ambulatory Visit: Payer: Self-pay | Admitting: *Deleted

## 2021-05-08 DIAGNOSIS — F439 Reaction to severe stress, unspecified: Secondary | ICD-10-CM

## 2021-05-08 NOTE — Telephone Encounter (Signed)
We can order a PSA but not sure if they can draw this at cardiology. He can come to our lab to have this done if they cannot draw it.  Algis Greenhouse. Jerline Pain, MD 05/08/2021 7:53 AM

## 2021-05-08 NOTE — Telephone Encounter (Signed)
Lab order Left message to return call to our office at their convenience.

## 2021-05-08 NOTE — Telephone Encounter (Signed)
Patient notified

## 2021-05-09 ENCOUNTER — Other Ambulatory Visit (INDEPENDENT_AMBULATORY_CARE_PROVIDER_SITE_OTHER): Payer: Medicare HMO

## 2021-05-09 ENCOUNTER — Other Ambulatory Visit: Payer: Self-pay

## 2021-05-09 DIAGNOSIS — F439 Reaction to severe stress, unspecified: Secondary | ICD-10-CM

## 2021-05-09 DIAGNOSIS — R69 Illness, unspecified: Secondary | ICD-10-CM | POA: Diagnosis not present

## 2021-05-09 LAB — PSA: PSA: 8.91 ng/mL — ABNORMAL HIGH (ref 0.10–4.00)

## 2021-05-09 NOTE — Progress Notes (Signed)
Please inform patient of the following:  His PSA is elevated and higher than last year. He probably needs a prostate biopsy. REcommend referral to urology.  Luis [name]. Jerline Pain, MD 05/09/2021 2:42 PM

## 2021-05-10 ENCOUNTER — Other Ambulatory Visit: Payer: Self-pay

## 2021-05-10 DIAGNOSIS — R972 Elevated prostate specific antigen [PSA]: Secondary | ICD-10-CM

## 2021-05-10 NOTE — Progress Notes (Signed)
Referral placed.

## 2021-05-15 ENCOUNTER — Encounter: Payer: Self-pay | Admitting: Family Medicine

## 2021-05-15 ENCOUNTER — Other Ambulatory Visit: Payer: Self-pay | Admitting: Cardiovascular Disease

## 2021-05-16 ENCOUNTER — Other Ambulatory Visit: Payer: Medicare HMO

## 2021-05-16 NOTE — Telephone Encounter (Signed)
See note

## 2021-05-17 DIAGNOSIS — R972 Elevated prostate specific antigen [PSA]: Secondary | ICD-10-CM | POA: Diagnosis not present

## 2021-05-17 DIAGNOSIS — N5201 Erectile dysfunction due to arterial insufficiency: Secondary | ICD-10-CM | POA: Diagnosis not present

## 2021-05-17 DIAGNOSIS — N401 Enlarged prostate with lower urinary tract symptoms: Secondary | ICD-10-CM | POA: Diagnosis not present

## 2021-05-17 DIAGNOSIS — R300 Dysuria: Secondary | ICD-10-CM | POA: Diagnosis not present

## 2021-05-17 DIAGNOSIS — R3915 Urgency of urination: Secondary | ICD-10-CM | POA: Diagnosis not present

## 2021-05-25 ENCOUNTER — Encounter: Payer: Self-pay | Admitting: Family Medicine

## 2021-05-25 ENCOUNTER — Other Ambulatory Visit: Payer: Self-pay

## 2021-05-25 ENCOUNTER — Ambulatory Visit (INDEPENDENT_AMBULATORY_CARE_PROVIDER_SITE_OTHER): Payer: Medicare HMO | Admitting: Family Medicine

## 2021-05-25 VITALS — BP 144/82 | HR 64 | Temp 97.3°F | Ht 72.0 in | Wt 205.6 lb

## 2021-05-25 DIAGNOSIS — Z23 Encounter for immunization: Secondary | ICD-10-CM

## 2021-05-25 DIAGNOSIS — Z0001 Encounter for general adult medical examination with abnormal findings: Secondary | ICD-10-CM | POA: Diagnosis not present

## 2021-05-25 DIAGNOSIS — E7849 Other hyperlipidemia: Secondary | ICD-10-CM | POA: Diagnosis not present

## 2021-05-25 DIAGNOSIS — G4700 Insomnia, unspecified: Secondary | ICD-10-CM

## 2021-05-25 DIAGNOSIS — R972 Elevated prostate specific antigen [PSA]: Secondary | ICD-10-CM | POA: Diagnosis not present

## 2021-05-25 DIAGNOSIS — C61 Malignant neoplasm of prostate: Secondary | ICD-10-CM | POA: Insufficient documentation

## 2021-05-25 DIAGNOSIS — I10 Essential (primary) hypertension: Secondary | ICD-10-CM

## 2021-05-25 DIAGNOSIS — R2681 Unsteadiness on feet: Secondary | ICD-10-CM | POA: Diagnosis not present

## 2021-05-25 DIAGNOSIS — R69 Illness, unspecified: Secondary | ICD-10-CM | POA: Diagnosis not present

## 2021-05-25 DIAGNOSIS — F439 Reaction to severe stress, unspecified: Secondary | ICD-10-CM

## 2021-05-25 MED ORDER — KETOCONAZOLE 2 % EX CREA: 1.0000 "application " | TOPICAL_CREAM | Freq: Two times a day (BID) | CUTANEOUS | 0 refills | Status: DC

## 2021-05-25 MED ORDER — FLUCONAZOLE 150 MG PO TABS: 150.0000 mg | ORAL_TABLET | ORAL | 0 refills | Status: DC | PRN

## 2021-05-25 NOTE — Assessment & Plan Note (Signed)
Doing well off Zoloft.  Mood has been stable.

## 2021-05-25 NOTE — Assessment & Plan Note (Signed)
Discussed referral to physical therapy however he would like to defer for now.  He will let me know if he changes mind.

## 2021-05-25 NOTE — Assessment & Plan Note (Signed)
Slightly above goal.  Typically well controlled.  Continue Norvasc 5 mg daily.  Discussed home blood pressure monitoring.

## 2021-05-25 NOTE — Assessment & Plan Note (Signed)
Following with urology

## 2021-05-25 NOTE — Patient Instructions (Signed)
It was very nice to see you today!  We will give you your flu shot today.   Please start the ketoconazole.  If not improving take the Diflucan.  Please keep working on diet and exercise.  Keep an eye on your blood pressure and let me know if it is persistently 150/90 or higher  Please come back to see me in 1 year for your next annual checkup.  Please schedule a lab appointment in about a week prior to this.  Come back to see me sooner if needed.  Take care, Dr Jerline Pain  PLEASE NOTE:  If you had any lab tests please let us know if you have not heard back within a few days. You may see your results on mychart before we have a chance to review them but we will give you a call once they are reviewed by [place]. If we ordered any referrals today, please let us know if you have not heard from their office within the next week.   Please try these tips to maintain a healthy lifestyle:  Eat at least 3 REAL meals and 1-2 snacks per day.  Aim for no more than 5 hours between eating.  If you eat breakfast, please do so within one hour of getting up.   Each meal should contain half fruits/vegetables, one quarter protein, and one quarter carbs (no bigger than a computer mouse)  Cut down on sweet beverages. This includes juice, soda, and sweet tea.   Drink at least 1 glass of water with each meal and aim for at least 8 glasses per day  Exercise at least 150 minutes every week.

## 2021-05-25 NOTE — Assessment & Plan Note (Signed)
Follows with cardiology.  On Lipitor 80 mg daily and Zetia 10 mg daily.

## 2021-05-25 NOTE — Progress Notes (Signed)
Chief Complaint:  Luis Luis [name] is a 86 y.o. male who presents today for his annual comprehensive physical exam.    Assessment/Plan:  New/Acute Problems: Tinea Cruris Start topical ketoconazole.  We will also send a Diflucan if not improving.  Discussed reasons to return to care.  Chronic Problems Addressed Today: HTN (hypertension) Slightly above goal.  Typically well controlled.  Continue Norvasc 5 mg daily.  Discussed home blood pressure monitoring.    Elevated PSA Following with urology.  HLD (hyperlipidemia) Follows with cardiology.  On Lipitor 80 mg daily and Zetia 10 mg daily.  Stress Doing well off Zoloft.  Mood has been stable.   Insomnia Stable off Ambien.  Unsteady gait Discussed referral to physical therapy however he would like to defer for now.  He will let me know if he changes mind.  Preventative Healthcare: UTD on blood work. Flu vaccine given today. PSA elevated.  Patient Counseling(The following topics were reviewed and/or handout was given):  -Nutrition: Stressed importance of moderation in sodium/caffeine intake, saturated fat and cholesterol, caloric balance, sufficient intake of fresh fruits, vegetables, and fiber.  -Stressed the importance of regular exercise.   -Substance Abuse: Discussed cessation/primary prevention of tobacco, alcohol, or other drug use; driving or other dangerous activities under the influence; availability of treatment for abuse.   -Injury prevention: Discussed safety belts, safety helmets, smoke detector, smoking near bedding or upholstery.   -Sexuality: Discussed sexually transmitted diseases, partner selection, use of condoms, avoidance of unintended pregnancy and contraceptive alternatives.   -Dental health: Discussed importance of regular tooth brushing, flossing, and dental visits.  -Health maintenance and immunizations reviewed. Please refer to Health maintenance section.  Return to care in 1 year for next preventative  visit.     Subjective:  HPI:  He has no acute complaints today.   He is retired. He has rash and would like this to be checked today.  Located in groin.  Pruritic.  No Luis [name].  No drainage.  Tried Lamisilk with no improvement.  He has had issue with imbalance. He thinks this is due to age and arthritis. He tried some exercises at health club in the past. He would let us know if he is interested in physical therapy.   He has had issue with stress. He notes he take Zoloft whenever he has increased stress. This sometimes makes him anxious. He is sleeping well and tolerating his medication well.   Lifestyle Diet: None specific. Exercise: None specific.  Depression screen PHQ 2/9 05/25/2021  Decreased Interest 0  Down, Depressed, Hopeless 0  PHQ - 2 Score 0    Health Maintenance Due  Topic Date Due   Zoster Vaccines- Shingrix (1 of 2) Never done   COVID-19 Vaccine (3 - Pfizer risk series) 08/12/2019     ROS: Per HPI, otherwise a complete review of systems was negative.   PMH:  The following were reviewed and entered/updated in epic: Past Medical History:  Diagnosis Date   CAD (coronary artery disease)    a. BMS-RCA, BMS-Cx, PTCA of OM1 in 2001. b. overlapping DES in RCA and PTCA to small distal Cx 2013. c. Cath 2016 -> stable CAD, suspect dyspnea unrelated to coronary disease.   COLONIC POLYPS, HX OF    [name] Luis [name]   DJD (degenerative joint disease)    Essential hypertension    Hyperlipidemia    Myocardial infarction (Luis Luis [name]) 08/1999   Persistent atrial fibrillation ([place])    a. dx 2016.   RBBB  TIA (transient ischemic attack)    Patient Active Problem List   Diagnosis Date Noted   Elevated PSA 05/25/2021   Stress 02/19/2019   Mild carotid artery disease ([place]) 02/19/2019   Insomnia 02/18/2018   Dizziness 11/27/2017   Unsteady gait 10/14/2017   TIA (transient ischemic attack) 11/29/2014   Atrial fibrillation ([place]) 11/29/2014   HTN (hypertension) 11/29/2014   HLD  (hyperlipidemia) 10/27/2007   Coronary atherosclerosis 10/27/2007   COLONIC POLYPS, HX OF 10/27/2007   Past Surgical History:  Procedure Laterality Date   CARDIAC CATHETERIZATION  ~ 2002   CARDIAC CATHETERIZATION N/A 03/02/2015   Procedure: Left Heart Cath and Coronary Angiography;  Surgeon: Luis Mocha, MD;  Location: [facility];  Service: Cardiovascular;  Laterality: N/A;   CATARACT EXTRACTION W/ INTRAOCULAR LENS  IMPLANT, BILATERAL  ~ 2007   bilaterally   CORONARY ANGIOPLASTY  07/30/11   CORONARY ANGIOPLASTY WITH STENT PLACEMENT  2001; 07/30/11;   "2+3"   INGUINAL HERNIA REPAIR  2005   left   LEFT HEART CATHETERIZATION WITH CORONARY ANGIOGRAM N/A 07/30/2011   Procedure: LEFT HEART CATHETERIZATION WITH CORONARY ANGIOGRAM;  Surgeon: Burnell Blanks, MD;  Location[facility] LAB;  Service: Cardiovascular;  Laterality: N/A;   PERCUTANEOUS CORONARY STENT INTERVENTION (PCI-S)  07/30/2011   Procedure: PERCUTANEOUS CORONARY STENT INTERVENTION (PCI-S);  Surgeon: Burnell Blanks, MD;  Location: [facility] CATH LAB;  Service: Cardiovascular;;   RETINAL DETACHMENT SURGERY  05/2008; 06/2008; 07/2008   left; "all 3 surgeries have failed"    Family History  Problem Relation Age of Onset   Colon cancer Father        in 80s   Prostate cancer Father    Alcohol abuse Mother     Medications- reviewed and updated Current Outpatient Medications  Medication Sig Dispense Refill   amLODipine (NORVASC) 5 MG tablet TAKE ONE TABLET EACH DAY 90 tablet 2   apixaban (ELIQUIS) 5 MG TABS tablet TAKE ONE TABLET TWICE DAILY 60 tablet 1   atorvastatin (LIPITOR) 80 MG tablet TAKE ONE TABLET EACH DAY 90 tablet 2   ezetimibe (ZETIA) 10 MG tablet TAKE ONE TABLET EACH DAY AT BEDTIME 90 tablet 2   fluconazole (DIFLUCAN) 150 MG tablet Take 1 tablet (150 mg total) by mouth every three (3) days as needed. 2 tablet 0   Glucosamine HCl 1000 MG TABS Take 1 tablet by mouth daily.      ketoconazole (NIZORAL) 2 %  cream Apply 1 application topically 2 (two) times daily. 60 g 0   nitroGLYCERIN (NITROSTAT) 0.4 MG SL tablet DISSOLVE 1 TABLET UNDER TONGUE AS NEEEDED FOR CHEST Luis [name], MAY REPEAT IN 5 MINUTES FOR 2 DOSES 25 tablet 6   NON FORMULARY      Potassium Aminobenzoate 500 MG TABS Take 1 tablet by mouth daily.     vitamin E 1000 UNIT capsule Take 2,000 Units by mouth daily.     folic acid (FOLVITE) 1 MG tablet Take by mouth. (Patient not taking: Reported on 05/25/2021)     sertraline (ZOLOFT) 100 MG tablet Take 1 tablet (100 mg total) by mouth daily. (Patient not taking: Reported on 05/25/2021) 30 tablet 3   No current facility-administered medications for this visit.    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; 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 Wt  Readings from Last 3 Encounters:  05/25/21 205 lb 9.6 oz (93.3 kg)  12/09/20 209 lb (94.8 kg)  11/24/20 202 lb 9.6 oz (91.9 kg)   Gen: NAD, resting comfortably HEENT: TMs normal bilaterally. OP clear. No thyromegaly noted.  CV: RRR with no murmurs appreciated Pulm: NWOB, CTAB with no crackles, wheezes, or rhonchi GI: Normal bowel sounds present. Soft, Nontender, Nondistended. MSK: no edema, cyanosis, or clubbing noted Skin: warm, dry Neuro: CN2-12 grossly intact. Strength 5/5 in upper and lower extremities. Reflexes symmetric and intact bilaterally.  Psych: Normal affect and thought content       I,Savera Zaman,acting as a scribe for Dimas Chyle, MD.,have documented all relevant documentation on the behalf of Dimas Chyle, MD,as directed by  Dimas Chyle, MD while in the presence of Dimas Chyle, MD.   I, Dimas Chyle, MD, have reviewed all documentation for this visit. The documentation on 05/25/21 for the exam, diagnosis, procedures, and orders are all accurate and complete.  Luis Luis [name]. Luis Pain, MD 05/25/2021 8:53 AM

## 2021-05-25 NOTE — Assessment & Plan Note (Signed)
Stable off Ambien.

## 2021-06-16 DIAGNOSIS — R972 Elevated prostate specific antigen [PSA]: Secondary | ICD-10-CM | POA: Diagnosis not present

## 2021-06-16 LAB — PSA: PSA: 8.7

## 2021-07-12 ENCOUNTER — Other Ambulatory Visit: Payer: Self-pay | Admitting: Cardiovascular Disease

## 2021-07-12 ENCOUNTER — Other Ambulatory Visit: Payer: Self-pay | Admitting: Urology

## 2021-07-12 DIAGNOSIS — R972 Elevated prostate specific antigen [PSA]: Secondary | ICD-10-CM

## 2021-07-12 DIAGNOSIS — Z5181 Encounter for therapeutic drug level monitoring: Secondary | ICD-10-CM

## 2021-07-12 DIAGNOSIS — I4891 Unspecified atrial fibrillation: Secondary | ICD-10-CM

## 2021-07-12 NOTE — Telephone Encounter (Signed)
Prescription refill request for Eliquis received. Indication: Afib   Last office visit: 11/24/20 Gilford Rile)  Scr: 1.05 (04/15/20)  Age: 87 Weight: 93.3kg    Labs overdue. Called and spoke with pt's wife. Scheduled lab appt for 07/13/21. Explained that medication can be refilled as soon as labs are resulted. Verbalized understanding.

## 2021-07-12 NOTE — Telephone Encounter (Deleted)
Prescription refill request for Eliquis received. Indication: Afib   Last office visit: 11/24/20 Gilford Rile)  Scr: 1.05 (04/15/20)  Age: 87 Weight: 93.3kg

## 2021-07-13 ENCOUNTER — Other Ambulatory Visit: Payer: Self-pay

## 2021-07-13 ENCOUNTER — Other Ambulatory Visit: Payer: Medicare HMO

## 2021-07-13 DIAGNOSIS — Z5181 Encounter for therapeutic drug level monitoring: Secondary | ICD-10-CM | POA: Diagnosis not present

## 2021-07-13 DIAGNOSIS — I4891 Unspecified atrial fibrillation: Secondary | ICD-10-CM

## 2021-07-13 LAB — CBC
Hematocrit: 45.5 % (ref 37.5–51.0)
Hemoglobin: 15.7 g/dL (ref 13.0–17.7)
MCH: 30.6 pg (ref 26.6–33.0)
MCHC: 34.5 g/dL (ref 31.5–35.7)
MCV: 89 fL (ref 79–97)
Platelets: 172 10*3/uL (ref 150–450)
RBC: 5.13 x10E6/uL (ref 4.14–5.80)
RDW: 12.3 % (ref 11.6–15.4)
WBC: 3.9 10*3/uL (ref 3.4–10.8)

## 2021-07-13 LAB — BASIC METABOLIC PANEL
BUN/Creatinine Ratio: 13 (ref 10–24)
BUN: 15 mg/dL (ref 8–27)
CO2: 22 mmol/L (ref 20–29)
Calcium: 8.9 mg/dL (ref 8.6–10.2)
Chloride: 103 mmol/L (ref 96–106)
Creatinine, Ser: 1.15 mg/dL (ref 0.76–1.27)
Glucose: 112 mg/dL — ABNORMAL HIGH (ref 70–99)
Potassium: 4.2 mmol/L (ref 3.5–5.2)
Sodium: 140 mmol/L (ref 134–144)
eGFR: 63 mL/min/{1.73_m2} (ref >59)

## 2021-07-13 NOTE — Telephone Encounter (Signed)
Labs drawn 07/13/2021 but not resulted. Will monitor to ensure correct dose of Eliquis.

## 2021-07-14 NOTE — Telephone Encounter (Signed)
Prescription refill request for Eliquis received. Indication: afib  Last office visit: 11/24/2020 Scr: 1.15, 07/13/2021 Age: 87 yo  Weight: 93.3 kg   Refill sent. Correct dose Eliquis 5mg  BID

## 2021-07-15 ENCOUNTER — Encounter: Payer: Self-pay | Admitting: Cardiovascular Disease

## 2021-07-17 MED ORDER — APIXABAN 5 MG PO TABS: 5.0000 mg | ORAL_TABLET | Freq: Two times a day (BID) | ORAL | 1 refills | Status: DC

## 2021-07-28 ENCOUNTER — Telehealth: Payer: Self-pay

## 2021-07-28 NOTE — Chronic Care Management (AMB) (Signed)
°  Chronic Care Management   Outreach Note  07/28/2021 Name: [name] MRN: [id number] DOB: 1934/06/25  Luis [name] is a 87 y.o. year old male who is a primary care patient of Vivi Barrack, MD. I reached out to Natalia Leatherwood by phone today in response to a referral sent by Mr. Hakop Humbarger Flegel's primary care provider.  An unsuccessful telephone outreach was attempted today. The patient was referred to the case management team for assistance with care management and care coordination.   Follow Up Plan: A HIPAA compliant phone message was left for the patient providing contact information and requesting a return call.  The care management team will reach out to the patient again over the next 7 days.  If patient returns call to provider office, please advise to call [place] * at [phone]*  Noreene Larsson, [place], [place] [facility]  [place], [place] [address] Direct Dial: [phone] [email or web address] Website: [email or web address]

## 2021-07-29 DIAGNOSIS — R1013 Epigastric pain: Secondary | ICD-10-CM | POA: Diagnosis not present

## 2021-07-31 DIAGNOSIS — R972 Elevated prostate specific antigen [PSA]: Secondary | ICD-10-CM | POA: Diagnosis not present

## 2021-08-10 NOTE — Chronic Care Management (AMB) (Signed)
?  Chronic Care Management  ? ?Note ? ?08/10/2021 ?Name: Luis [name] MRN: [id number] DOB: June 11, 1935 ? ?[name] is a 87 y.o. year old male who is a primary care patient of Vivi Barrack, MD. I reached out to Natalia Leatherwood by phone today in response to a referral sent by Luis [name]'s PCP. ? ?Luis [name] was given information about Chronic Care Management services today including:  ?CCM service includes personalized support from designated clinical staff supervised by his physician, including individualized plan of care and coordination with other care providers ?24/7 contact phone numbers for assistance for urgent and routine care needs. ?Service will only be billed when office clinical staff spend 20 minutes or more in a month to coordinate care. ?Only one practitioner may furnish and bill the service in a calendar month. ?The patient may stop CCM services at any time (effective at the end of the month) by phone call to the office staff. ?The patient is responsible for co-pay (up to 20% after annual deductible is met) if co-pay is required by the individual health plan.  ? ?Patient did not agree to enrollment in care management services and does not wish to consider at this time. ? ?Follow up plan: ?Patient declines further follow up and engagement by the care management team. Appropriate care team members and provider have been notified via electronic communication.  ? ?Noreene Larsson, RMA ?Care Guide, Embedded Care Coordination ?[facility]  Care Management  ?[place], [place] [address] ?Direct Dial: [phone] ?[email or web address] ?Website: [email or web address]  ? ?

## 2021-08-11 ENCOUNTER — Other Ambulatory Visit: Payer: Self-pay

## 2021-08-11 ENCOUNTER — Ambulatory Visit
Admission: RE | Admit: 2021-08-11 | Discharge: 2021-08-11 | Disposition: A | Discharge status: Home / Self Care | Payer: Medicare HMO | Source: Ambulatory Visit | Attending: Urology | Admitting: Urology

## 2021-08-11 DIAGNOSIS — R972 Elevated prostate specific antigen [PSA]: Secondary | ICD-10-CM | POA: Diagnosis not present

## 2021-08-11 DIAGNOSIS — B029 Zoster without complications: Secondary | ICD-10-CM | POA: Diagnosis not present

## 2021-08-11 MED ORDER — GADOBENATE DIMEGLUMINE 529 MG/ML IV SOLN
19.0000 mL | Freq: Once | INTRAVENOUS | Status: AC | PRN
  Administered 2021-08-11: 19 mL via INTRAVENOUS

## 2021-08-18 DIAGNOSIS — R972 Elevated prostate specific antigen [PSA]: Secondary | ICD-10-CM | POA: Diagnosis not present

## 2021-08-18 DIAGNOSIS — R3915 Urgency of urination: Secondary | ICD-10-CM | POA: Diagnosis not present

## 2021-08-18 DIAGNOSIS — R300 Dysuria: Secondary | ICD-10-CM | POA: Diagnosis not present

## 2021-08-18 DIAGNOSIS — N401 Enlarged prostate with lower urinary tract symptoms: Secondary | ICD-10-CM | POA: Diagnosis not present

## 2021-08-18 DIAGNOSIS — R9349 Abnormal radiologic findings on diagnostic imaging of other urinary organs: Secondary | ICD-10-CM | POA: Diagnosis not present

## 2021-08-21 ENCOUNTER — Encounter: Payer: Self-pay | Admitting: Family Medicine

## 2021-10-04 ENCOUNTER — Telehealth: Payer: Self-pay | Admitting: Family Medicine

## 2021-10-04 NOTE — Telephone Encounter (Signed)
Copied from [place] [phone]. Topic: Medicare AWV ?>> Oct 04, 2021 10:18 AM Harris-Coley, Hannah Beat wrote: ?Reason for CRM: Left message for patient to schedule Annual Wellness Visit.  Please schedule with Nurse Health Advisor Charlott Rakes, RN at [facility].  Please call [phone] ask for Juliann Pulse ?

## 2021-11-22 DIAGNOSIS — R972 Elevated prostate specific antigen [PSA]: Secondary | ICD-10-CM | POA: Diagnosis not present

## 2021-11-27 DIAGNOSIS — N401 Enlarged prostate with lower urinary tract symptoms: Secondary | ICD-10-CM | POA: Diagnosis not present

## 2021-11-27 DIAGNOSIS — R9349 Abnormal radiologic findings on diagnostic imaging of other urinary organs: Secondary | ICD-10-CM | POA: Diagnosis not present

## 2021-11-27 DIAGNOSIS — N5201 Erectile dysfunction due to arterial insufficiency: Secondary | ICD-10-CM | POA: Diagnosis not present

## 2021-11-27 DIAGNOSIS — R972 Elevated prostate specific antigen [PSA]: Secondary | ICD-10-CM | POA: Diagnosis not present

## 2021-11-27 DIAGNOSIS — R3915 Urgency of urination: Secondary | ICD-10-CM | POA: Diagnosis not present

## 2021-12-18 DIAGNOSIS — D692 Other nonthrombocytopenic purpura: Secondary | ICD-10-CM | POA: Diagnosis not present

## 2021-12-18 DIAGNOSIS — L821 Other seborrheic keratosis: Secondary | ICD-10-CM | POA: Diagnosis not present

## 2021-12-18 DIAGNOSIS — Z85828 Personal history of other malignant neoplasm of skin: Secondary | ICD-10-CM | POA: Diagnosis not present

## 2021-12-18 DIAGNOSIS — C44212 Basal cell carcinoma of skin of right ear and external auricular canal: Secondary | ICD-10-CM | POA: Diagnosis not present

## 2021-12-18 DIAGNOSIS — C44319 Basal cell carcinoma of skin of other parts of face: Secondary | ICD-10-CM | POA: Diagnosis not present

## 2021-12-18 DIAGNOSIS — L570 Actinic keratosis: Secondary | ICD-10-CM | POA: Diagnosis not present

## 2021-12-18 DIAGNOSIS — D485 Neoplasm of uncertain behavior of skin: Secondary | ICD-10-CM | POA: Diagnosis not present

## 2022-01-08 ENCOUNTER — Telehealth: Payer: Self-pay

## 2022-01-08 NOTE — Telephone Encounter (Signed)
Left voice message to call back to schedule an AWV

## 2022-02-06 ENCOUNTER — Other Ambulatory Visit: Payer: Self-pay | Admitting: Cardiovascular Disease

## 2022-02-19 DIAGNOSIS — C44712 Basal cell carcinoma of skin of right lower limb, including hip: Secondary | ICD-10-CM | POA: Diagnosis not present

## 2022-02-22 ENCOUNTER — Other Ambulatory Visit: Payer: Self-pay | Admitting: Cardiovascular Disease

## 2022-03-04 NOTE — Progress Notes (Signed)
Office Visit    Patient Name: ARGYLE GUSTAFSON Date of Encounter: 03/05/2022  Primary Care Provider:  Vivi Barrack, MD Primary Cardiologist:  Lauree Chandler, MD Primary Electrophysiologist: None  Chief Complaint    AZAVION BOUILLON is a 87 y.o. male with PMH of significant CAD s/p BMS to RCA and mid circumflex in 2001,2013 not overlapping DES x2 to RCA and PTCA only to small distal circumflex and HTN, HLD, RBBB, TIA, mild carotid disease (1-39% and 2019, atrial fibrillation (on Eliquis) who presents today for follow-up of coronary artery disease.  Past Medical History    Past Medical History:  Diagnosis Date   CAD (coronary artery disease)    a. BMS-RCA, BMS-Cx, PTCA of OM1 in 2001. b. overlapping DES in RCA and PTCA to small distal Cx 2013. c. Cath 2016 -> stable CAD, suspect dyspnea unrelated to coronary disease.   COLONIC POLYPS, HX OF    Dr Earlean Shawl   DJD (degenerative joint disease)    Essential hypertension    Hyperlipidemia    Myocardial infarction ([place]) 08/1999   Persistent atrial fibrillation ([place])    a. dx 2016.   RBBB    TIA (transient ischemic attack)    Past Surgical History:  Procedure Laterality Date   CARDIAC CATHETERIZATION  ~ 2002   CARDIAC CATHETERIZATION N/A 03/02/2015   Procedure: Left Heart Cath and Coronary Angiography;  Surgeon: Sherren Mocha, MD;  Location: [facility];  Service: Cardiovascular;  Laterality: N/A;   CATARACT EXTRACTION W/ INTRAOCULAR LENS  IMPLANT, BILATERAL  ~ 2007   bilaterally   CORONARY ANGIOPLASTY  07/30/11   CORONARY ANGIOPLASTY WITH STENT PLACEMENT  2001; 07/30/11;   "2+3"   INGUINAL HERNIA REPAIR  2005   left   LEFT HEART CATHETERIZATION WITH CORONARY ANGIOGRAM N/A 07/30/2011   Procedure: LEFT HEART CATHETERIZATION WITH CORONARY ANGIOGRAM;  Surgeon: Burnell Blanks, MD;  Location: [facility] CATH LAB;  Service: Cardiovascular;  Laterality: N/A;   PERCUTANEOUS CORONARY STENT INTERVENTION (PCI-S)  07/30/2011    Procedure: PERCUTANEOUS CORONARY STENT INTERVENTION (PCI-S);  Surgeon: Burnell Blanks, MD;  Location: [facility] CATH LAB;  Service: Cardiovascular;;   RETINAL DETACHMENT SURGERY  05/2008; 06/2008; 07/2008   left; "all 3 surgeries have failed"    Allergies  No Known Allergies  History of Present Illness    MANCE VALLEJO  is a 87 year old male with the above mention past medical history who presents today for follow-up of coronary artery disease.  Patient's cardiac history dates back to 08/1999 when he presented with an inferior MI that was treated with BMS x2.  In 07/2011 stress test was completed for complaint of dyspnea and revealed mild inferior wall ischemia.  LHC was performed revealing RCA in-stent restenosis that was treated with DES x2. He was admitted to [facility] 11/30/14 with TIA type symptoms and was found to be in atrial fibrillation. He was started on Eliquis and atenolol.  Mr. Peterson Lombard last [place] was completed in 02/2015 and revealed stable CAD.  2D echo was completed also in 11/2014 that showed normal LV function with no valvular issues.    He was last seen by Dr. Angelena Form on 08/2019 for follow-up.  During visit patient had no complaints of angina or palpitations.  Patient's blood pressure was mildly elevated during visit and he was started on amlodipine 5 mg daily.  He remained rate controlled with atenolol 25 mg.  Mr. Georgiana Spinner presents today alone for annual follow-up.  Since last  being seen in the office patient reports he has been doing well from cardiac standpoint.  He is exercising 2 miles a day with walking and reports no dizziness or chest pain with activity.  He is currently in atrial fibrillation but states that he is asymptomatic and rate is currently controlled at 61 bpm.  Blood pressures today are much better controlled at 126/88.  He has been off of his amlodipine for 10 days and states his pressures are controlled at home.  During our visit we discussed the pathophysiology of  atrial fibrillation and all questions were answered to patient's satisfaction.  He reports that he may have prostate cancer due to elevated PSA that is followed by Dr. Jeffie Pollock. Patient denies chest pain, palpitations, dyspnea, PND, orthopnea, nausea, vomiting, dizziness, syncope, edema, weight gain, or early satiety.  Home Medications    Current Outpatient Medications  Medication Sig Dispense Refill   apixaban (ELIQUIS) 5 MG TABS tablet Take 1 tablet (5 mg total) by mouth 2 (two) times daily. 180 tablet 1   atorvastatin (LIPITOR) 80 MG tablet Take 1 tablet (80 mg total) by mouth daily. Please call [phone] to schedule an overdue appointment with Mr. Angelena Form for future refills. Thank you. 1st attempt. 30 tablet 0   ezetimibe (ZETIA) 10 MG tablet Take 1 tablet (10 mg total) by mouth at bedtime. Please call [phone] to schedule an overdue appointment with Mr. Angelena Form for future refills. Thank you. 1st attempt. 30 tablet 0   Glucosamine HCl 1000 MG TABS Take 1 tablet by mouth daily.      nitroGLYCERIN (NITROSTAT) 0.4 MG SL tablet DISSOLVE 1 TABLET UNDER TONGUE AS NEEEDED FOR CHEST PAIN, MAY REPEAT IN 5 MINUTES FOR 2 DOSES 25 tablet 6   NON FORMULARY      Potassium Aminobenzoate 500 MG TABS Take 1 tablet by mouth daily.     vitamin E 1000 UNIT capsule Take 2,000 Units by mouth daily.     amLODipine (NORVASC) 5 MG tablet TAKE ONE TABLET EACH DAY (Patient not taking: Reported on 03/05/2022) 90 tablet 2   DULoxetine (CYMBALTA) 30 MG capsule Take 30 mg by mouth daily.     fluconazole (DIFLUCAN) 150 MG tablet Take 1 tablet (150 mg total) by mouth every three (3) days as needed. (Patient not taking: Reported on 03/05/2022) 2 tablet 0   FLUoxetine (PROZAC) 20 MG capsule Take 20 mg by mouth daily.     folic acid (FOLVITE) 1 MG tablet Take by mouth. (Patient not taking: Reported on 05/25/2021)     ketoconazole (NIZORAL) 2 % cream Apply 1 application topically 2 (two) times daily. (Patient not taking:  Reported on 03/05/2022) 60 g 0   nystatin-triamcinolone (MYCOLOG II) cream SMARTSIG:Topical Morning-Evening     sertraline (ZOLOFT) 100 MG tablet Take 1 tablet (100 mg total) by mouth daily. (Patient not taking: Reported on 05/25/2021) 30 tablet 3   No current facility-administered medications for this visit.     Review of Systems  Please see the history of present illness.      All other systems reviewed and are otherwise negative except as noted above.  Physical Exam    Wt Readings from Last 3 Encounters:  03/05/22 187 lb (84.8 kg)  05/25/21 205 lb 9.6 oz (93.3 kg)  12/09/20 209 lb (94.8 kg)   VS: Vitals:   03/05/22 0839  BP: 126/88  Pulse: 91  SpO2: 97%  ,Body mass index is 25.36 kg/m.  Constitutional:      Appearance:  Healthy appearance. Not in distress.  Neck:     Vascular: JVD normal.  Pulmonary:     Effort: Pulmonary effort is normal.     Breath sounds: No wheezing. No rales. Diminished in the bases Cardiovascular:     Irregularly irregular normal S1. Normal S2.      Murmurs: There is no murmur.  Edema:    Peripheral edema absent.  Abdominal:     Palpations: Abdomen is soft non tender. There is no hepatomegaly.  Skin:    General: Skin is warm and dry.  Neurological:     General: No focal deficit present.     Mental Status: Alert and oriented to person, place and time.     Cranial Nerves: Cranial nerves are intact.  EKG/LABS/Other Studies Reviewed    ECG personally reviewed by me today -atrial fibrillation with left axis deviation and right bundle branch block with rate of 61 bpm and no acute changes.  Risk Assessment/Calculations:    CHA2DS2-VASc Score = 4   This indicates a 4.8% annual risk of stroke. The patient's score is based upon: CHF History: 0 HTN History: 1 Diabetes History: 0 Stroke History: 0 Vascular Disease History: 1 Age Score: 2 Gender Score: 0           Lab Results  Component Value Date   WBC 3.9 07/13/2021   HGB 15.7  07/13/2021   HCT 45.5 07/13/2021   MCV 89 07/13/2021   PLT 172 07/13/2021   Lab Results  Component Value Date   CREATININE 1.15 07/13/2021   BUN 15 07/13/2021   NA 140 07/13/2021   K 4.2 07/13/2021   CL 103 07/13/2021   CO2 22 07/13/2021   Lab Results  Component Value Date   ALT 24 08/03/2020   AST 19 08/03/2020   ALKPHOS 68 08/03/2020   BILITOT 0.7 08/03/2020   Lab Results  Component Value Date   CHOL 116 08/03/2020   HDL 49 08/03/2020   LDLCALC 55 08/03/2020   TRIG 55 08/03/2020   CHOLHDL 2.4 08/03/2020    Lab Results  Component Value Date   HGBA1C 6.1 (H) 11/30/2014    Assessment & Plan    1.  Coronary artery disease: -s/p inferior MI in 2001 with BMS x2 and DES x2 in 2013.  Last cath completed 2016 which showed  nonobstructive and stable CAD -Today patient reports no chest discomfort or anginal complaints -Continue GDMT with Lipitor 80 mg, Zetia 10 mg currently not on ASA due to Eliquis  2.  Essential hypertension: -Patient's blood pressure today was well controlled at 126/88 -Continue amlodipine 5 mg for the next week and if blood pressures are improved we will plan to discontinue  3.  Persistent atrial fibrillation: -Patient is rate controlled atrial fibrillation at 61 bpm -He denies any bleeding on Eliquis -Current dose appropriate based on previous creatinine and hemoglobin. -Continue Eliquis 5 mg twice daily -We will recheck BMET and CBC today  4.  Mild carotid disease: -Patient's last Doppler was completed 10/2017 -We will arrange for follow-up carotid Doppler for surveillance  5.  Hyperlipidemia: -Patient's last LDL was 55 on 07/2020 -Continue Lipitor and Zetia as noted above  Disposition: Follow-up with Lauree Chandler, MD or APP in 12 months    Medication Adjustments/Labs and Tests Ordered: Current medicines are reviewed at length with the patient today.  Concerns regarding medicines are outlined above.   Signed, Mable Fill, Marissa Nestle, NP 03/05/2022, 8:57 AM [facility]

## 2022-03-05 ENCOUNTER — Encounter: Payer: Self-pay | Admitting: Nurse Practitioner

## 2022-03-05 ENCOUNTER — Encounter: Payer: Self-pay | Admitting: *Deleted

## 2022-03-05 ENCOUNTER — Ambulatory Visit: Payer: Medicare HMO | Attending: Nurse Practitioner | Admitting: Nurse Practitioner

## 2022-03-05 VITALS — BP 126/88 | HR 91 | Ht 72.0 in | Wt 187.0 lb

## 2022-03-05 DIAGNOSIS — I779 Disorder of arteries and arterioles, unspecified: Secondary | ICD-10-CM | POA: Diagnosis not present

## 2022-03-05 DIAGNOSIS — I10 Essential (primary) hypertension: Secondary | ICD-10-CM | POA: Diagnosis not present

## 2022-03-05 DIAGNOSIS — G459 Transient cerebral ischemic attack, unspecified: Secondary | ICD-10-CM | POA: Diagnosis not present

## 2022-03-05 DIAGNOSIS — E782 Mixed hyperlipidemia: Secondary | ICD-10-CM | POA: Diagnosis not present

## 2022-03-05 DIAGNOSIS — I2510 Atherosclerotic heart disease of native coronary artery without angina pectoris: Secondary | ICD-10-CM | POA: Diagnosis not present

## 2022-03-05 MED ORDER — AMLODIPINE BESYLATE 5 MG PO TABS: 5.0000 mg | ORAL_TABLET | Freq: Every day | ORAL | 2 refills | Status: DC

## 2022-03-05 NOTE — Patient Instructions (Signed)
Medication Instructions:   START Amlodipine one (1) tablet by mouth ( 5 mg) daily. For 1 week  than after 1 week send in BP readings by mychart or call [phone] and ask for triage nurse.   *If you need a refill on your cardiac medications before your next appointment, please call your pharmacy*   Lab Work:  TODAY!!!! BMET/CBC  If you have labs (blood work) drawn today and your tests are completely normal, you will receive your results only by: [place] (if you have MyChart) OR A paper copy in the mail If you have any lab test that is abnormal or we need to change your treatment, we will call you to review the results.   Testing/Procedures:  Your physician has requested that you have a carotid duplex. This test is an ultrasound of the carotid arteries in your neck. It looks at blood flow through these arteries that supply the brain with blood. Allow one hour for this exam. There are no restrictions or special instructions.    Follow-Up: At [facility], you and your health needs are our priority.  As part of our continuing mission to provide you with exceptional heart care, we have created designated Provider Care Teams.  These Care Teams include your primary Cardiologist (physician) and Advanced Practice Providers (APPs -  Physician Assistants and Nurse Practitioners) who all work together to provide you with the care you need, when you need it.  We recommend signing up for the patient portal called "MyChart".  Sign up information is provided on this After Visit Summary.  MyChart is used to connect with patients for Virtual Visits (Telemedicine).  Patients are able to view lab/test results, encounter notes, upcoming appointments, etc.  Non-urgent messages can be sent to your provider as well.   To learn more about what you can do with MyChart, go to [email or web address].    Your next appointment:   1 year(s)  The format for your next appointment:   In  Person  Provider:   Lauree Chandler, MD     Other Instructions  Your physician wants you to follow-up in: 1 year with Dr. Angelena Form.  You will receive a reminder letter in the mail two months in advance. If you don't receive a letter, please call our office to schedule the follow-up appointment.   Important Information About Sugar

## 2022-03-06 LAB — BASIC METABOLIC PANEL
BUN/Creatinine Ratio: 13 (ref 10–24)
BUN: 13 mg/dL (ref 8–27)
CO2: 24 mmol/L (ref 20–29)
Calcium: 9.6 mg/dL (ref 8.6–10.2)
Chloride: 104 mmol/L (ref 96–106)
Creatinine, Ser: 1.03 mg/dL (ref 0.76–1.27)
Glucose: 98 mg/dL (ref 70–99)
Potassium: 4.4 mmol/L (ref 3.5–5.2)
Sodium: 141 mmol/L (ref 134–144)
eGFR: 71 mL/min/{1.73_m2} (ref >59)

## 2022-03-06 LAB — CBC
Hematocrit: 44.7 % (ref 37.5–51.0)
Hemoglobin: 15.3 g/dL (ref 13.0–17.7)
MCH: 31.4 pg (ref 26.6–33.0)
MCHC: 34.2 g/dL (ref 31.5–35.7)
MCV: 92 fL (ref 79–97)
Platelets: 221 10*3/uL (ref 150–450)
RBC: 4.88 x10E6/uL (ref 4.14–5.80)
RDW: 13.2 % (ref 11.6–15.4)
WBC: 5.5 10*3/uL (ref 3.4–10.8)

## 2022-03-14 DIAGNOSIS — C44212 Basal cell carcinoma of skin of right ear and external auricular canal: Secondary | ICD-10-CM | POA: Diagnosis not present

## 2022-03-15 ENCOUNTER — Ambulatory Visit (HOSPITAL_COMMUNITY)
Admission: RE | Admit: 2022-03-15 | Discharge: 2022-03-15 | Disposition: A | Discharge status: Home / Self Care | Payer: Medicare HMO | Source: Ambulatory Visit | Attending: Cardiology | Admitting: Cardiology

## 2022-03-15 DIAGNOSIS — I779 Disorder of arteries and arterioles, unspecified: Secondary | ICD-10-CM | POA: Diagnosis not present

## 2022-03-15 DIAGNOSIS — E782 Mixed hyperlipidemia: Secondary | ICD-10-CM | POA: Insufficient documentation

## 2022-03-15 DIAGNOSIS — I2510 Atherosclerotic heart disease of native coronary artery without angina pectoris: Secondary | ICD-10-CM | POA: Insufficient documentation

## 2022-03-15 DIAGNOSIS — I10 Essential (primary) hypertension: Secondary | ICD-10-CM | POA: Diagnosis not present

## 2022-03-15 DIAGNOSIS — G459 Transient cerebral ischemic attack, unspecified: Secondary | ICD-10-CM | POA: Diagnosis not present

## 2022-03-27 ENCOUNTER — Other Ambulatory Visit: Payer: Self-pay | Admitting: *Deleted

## 2022-03-27 DIAGNOSIS — I779 Disorder of arteries and arterioles, unspecified: Secondary | ICD-10-CM

## 2022-03-27 NOTE — Progress Notes (Signed)
Order for repeat carotid ultrasound per notes by Ambrose Pancoast, NP.

## 2022-03-28 DIAGNOSIS — R972 Elevated prostate specific antigen [PSA]: Secondary | ICD-10-CM | POA: Diagnosis not present

## 2022-04-02 ENCOUNTER — Other Ambulatory Visit: Payer: Self-pay | Admitting: Cardiovascular Disease

## 2022-04-04 DIAGNOSIS — N401 Enlarged prostate with lower urinary tract symptoms: Secondary | ICD-10-CM | POA: Diagnosis not present

## 2022-04-04 DIAGNOSIS — R3915 Urgency of urination: Secondary | ICD-10-CM | POA: Diagnosis not present

## 2022-04-04 DIAGNOSIS — R972 Elevated prostate specific antigen [PSA]: Secondary | ICD-10-CM | POA: Diagnosis not present

## 2022-04-04 DIAGNOSIS — N5201 Erectile dysfunction due to arterial insufficiency: Secondary | ICD-10-CM | POA: Diagnosis not present

## 2022-04-05 ENCOUNTER — Ambulatory Visit (INDEPENDENT_AMBULATORY_CARE_PROVIDER_SITE_OTHER): Payer: Medicare HMO

## 2022-04-05 VITALS — Wt 187.0 lb

## 2022-04-05 DIAGNOSIS — Z0000 Encounter for general adult medical examination without abnormal findings: Secondary | ICD-10-CM

## 2022-04-05 NOTE — Progress Notes (Signed)
I connected with  Luis [name] on 04/05/22 by a audio enabled telemedicine application and verified that I am speaking with the correct person using two identifiers.  Patient Location: Home  Provider Location: Office/Clinic  I discussed the limitations of evaluation and management by telemedicine. The patient expressed understanding and agreed to proceed.   Subjective:   Luis [name] is a 87 y.o. male who presents for Medicare Annual/Subsequent preventive examination.  Review of Systems     Cardiac Risk Factors include: advanced age (>35mn, >>89women);dyslipidemia;hypertension;male gender     Objective:    Today's Vitals   04/05/22 1027  Weight: 187 lb (84.8 kg)   Body mass index is 25.36 kg/m.     04/05/2022   10:33 AM 02/07/2019    4:34 PM 03/04/2017   12:50 PM 03/02/2015    8:56 AM 11/29/2014    3:09 PM 07/30/2011    5:17 PM  Advanced Directives  Does Patient Have a Medical Advance Directive? Yes No No No No Patient has advance directive, copy not in chart  Type of [place]ive [place]     [place]? No - copy requested     Copy requested from family  Would patient like information on creating a medical advance directive?    No - patient declined information No - patient declined information     Current Medications (verified) Outpatient Encounter Medications as of 04/05/2022  Medication Sig   apixaban (ELIQUIS) 5 MG TABS tablet Take 1 tablet (5 mg total) by mouth 2 (two) times daily.   atorvastatin (LIPITOR) 80 MG tablet TAKE ONE TABLET EACH DAY   DULoxetine (CYMBALTA) 30 MG capsule Take 30 mg by mouth daily.   ezetimibe (ZETIA) 10 MG tablet TAKE ONE TABLET EACH DAY AT BEDTIME   FLUoxetine (PROZAC) 20 MG capsule Take 20 mg by mouth daily.   Glucosamine HCl 1000 MG TABS Take 1 tablet by mouth daily.    ketoconazole (NIZORAL) 2 % cream Apply 1 application  topically 2 (two) times daily.   nitroGLYCERIN (NITROSTAT) 0.4 MG SL tablet DISSOLVE 1 TABLET UNDER TONGUE AS NEEEDED FOR CHEST PAIN, MAY REPEAT IN 5 MINUTES FOR 2 DOSES   NON FORMULARY    nystatin-triamcinolone (MYCOLOG II) cream SMARTSIG:Topical Morning-Evening   Potassium Aminobenzoate 500 MG TABS Take 1 tablet by mouth daily.   vitamin E 1000 UNIT capsule Take 2,000 Units by mouth daily.   amLODipine (NORVASC) 5 MG tablet Take 1 tablet (5 mg total) by mouth daily. (Patient not taking: Reported on 04/05/2022)   [DISCONTINUED] amLODipine (NORVASC) 5 MG tablet TAKE ONE TABLET EACH DAY (Patient not taking: Reported on 03/05/2022)   [DISCONTINUED] fluconazole (DIFLUCAN) 150 MG tablet Take 1 tablet (150 mg total) by mouth every three (3) days as needed. (Patient not taking: Reported on 03/05/2022)   [DISCONTINUED] folic acid (FOLVITE) 1 MG tablet Take by mouth. (Patient not taking: Reported on 05/25/2021)   [DISCONTINUED] sertraline (ZOLOFT) 100 MG tablet Take 1 tablet (100 mg total) by mouth daily.   No facility-administered encounter medications on file as of 04/05/2022.    Allergies (verified) Patient has no known allergies.   History: Past Medical History:  Diagnosis Date   CAD (coronary artery disease)    a. BMS-RCA, BMS-Cx, PTCA of OM1 in 2001. b. overlapping DES in RCA and PTCA to small distal Cx 2013. c. Cath 2016 -> stable CAD, suspect dyspnea  unrelated to coronary disease.   COLONIC POLYPS, HX OF    Dr Earlean Shawl   DJD (degenerative joint disease)    Essential hypertension    Hyperlipidemia    Myocardial infarction ([place]) 08/1999   Persistent atrial fibrillation ([place])    a. dx 2016.   RBBB    TIA (transient ischemic attack)    Past Surgical History:  Procedure Laterality Date   CARDIAC CATHETERIZATION  ~ 2002   CARDIAC CATHETERIZATION N/A 03/02/2015   Procedure: Left Heart Cath and Coronary Angiography;  Surgeon: Sherren Mocha, MD;  Location: [facility];  Service:  Cardiovascular;  Laterality: N/A;   CATARACT EXTRACTION W/ INTRAOCULAR LENS  IMPLANT, BILATERAL  ~ 2007   bilaterally   CORONARY ANGIOPLASTY  07/30/11   CORONARY ANGIOPLASTY WITH STENT PLACEMENT  2001; 07/30/11;   "2+3"   INGUINAL HERNIA REPAIR  2005   left   LEFT HEART CATHETERIZATION WITH CORONARY ANGIOGRAM N/A 07/30/2011   Procedure: LEFT HEART CATHETERIZATION WITH CORONARY ANGIOGRAM;  Surgeon: Burnell Blanks, MD;  Location: [facility] CATH LAB;  Service: Cardiovascular;  Laterality: N/A;   PERCUTANEOUS CORONARY STENT INTERVENTION (PCI-S)  07/30/2011   Procedure: PERCUTANEOUS CORONARY STENT INTERVENTION (PCI-S);  Surgeon: Burnell Blanks, MD;  Location: [facility] CATH LAB;  Service: Cardiovascular;;   RETINAL DETACHMENT SURGERY  05/2008; 06/2008; 07/2008   left; "all 3 surgeries have failed"   Family History  Problem Relation Age of Onset   Colon cancer Father        in 51s   Prostate cancer Father    Alcohol abuse Mother    Social History   Socioeconomic History   Marital status: Married    Spouse name: Not on file   Number of children: Not on file   Years of education: Not on file   Highest education level: Not on file  Occupational History   Not on file  Tobacco Use   Smoking status: Former    Packs/day: 0.50    Years: 40.00    Total pack years: 20.00    Types: Cigarettes    Quit date: 06/12/1999    Years since quitting: 22.8   Smokeless tobacco: Never  Vaping Use   Vaping Use: Never used  Substance and Sexual Activity   Alcohol use: No    Comment: 07/30/11 "recovering alcoholic; 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"   Drug use: No   Sexual activity: Not Currently  Other Topics Concern   Not on file  Social History Narrative   Not on file   Social Determinants of Health   Financial Resource Strain: Low Risk  (04/05/2022)   Overall Financial Resource Strain (CARDIA)    Difficulty of Paying Living Expenses: Not hard at all  Food Insecurity: No Food Insecurity (04/05/2022)   Hunger  Vital Sign    Worried About Running Out of Food in the Last Year: Never true    [place] in the Last Year: Never true  Transportation Needs: No Transportation Needs (04/05/2022)   PRAPARE - Hydrologist (Medical): No    Lack of Transportation (Non-Medical): No  Physical Activity: Sufficiently Active (04/05/2022)   Exercise Vital Sign    Days of Exercise per Week: 5 days    Minutes of Exercise per Session: 90 min  Stress: No Stress Concern Present (04/05/2022)   [place]    Feeling of Stress : Not at all  Social Connections: [place] (04/05/2022)  Social Licensed conveyancer [NHANES]    Frequency of Communication with Friends and Family: More than three times a week    Frequency of Social Gatherings with Friends and Family: More than three times a week    Attends Religious Services: More than 4 times per year    Active Member of [employer] or Organizations: Yes    Attends Archivist Meetings: 1 to 4 times per year    Marital Status: Married    Tobacco Counseling Counseling given: Not Answered   Clinical Intake:  Pre-visit preparation completed: Yes  Pain : No/denies pain     BMI - recorded: 25.36 Nutritional Status: BMI 25 -29 Overweight Nutritional Risks: None Diabetes: No  How often do you need to have someone help you when you read instructions, pamphlets, or other written materials from your doctor or pharmacy?: 1 - Never  Diabetic?no  Interpreter Needed?: No  Information entered by :: Charlott Rakes, LPN   Activities of Daily Living    04/05/2022   10:35 AM  In your present state of health, do you have any difficulty performing the following activities:  Hearing? 0  Vision? 0  Difficulty concentrating or making decisions? 0  Walking or climbing stairs? 0  Dressing or bathing? 0  Doing errands, shopping? 0  Preparing Food and  eating ? N  Using the Toilet? N  In the past six months, have you accidently leaked urine? N  Do you have problems with loss of bowel control? N  Managing your Medications? N  Managing your Finances? N  Housekeeping or managing your Housekeeping? N    Patient Care Team: Vivi Barrack, MD as PCP - General (Family Medicine) Burnell Blanks, MD as PCP - Cardiology (Cardiology)  Indicate any recent Medical Services you may have received from other than [facility] providers in the past year (date may be approximate).     Assessment:   This is a routine wellness examination for Luis [name].  Hearing/Vision screen Hearing Screening - Comments:: Pt denies any hearing issues  Vision Screening - Comments:: Pt follows up with [facility] opthalmology for anul eye exams   Dietary issues and exercise activities discussed:     Goals Addressed             This Visit's Progress    Patient Stated       Maintain health        Depression Screen    04/05/2022   10:31 AM 05/25/2021    8:13 AM 02/19/2019    2:26 PM 10/14/2017    2:28 PM  PHQ 2/9 Scores  PHQ - 2 Score 1 0 0 0    Fall Risk    04/05/2022   10:35 AM 05/25/2021    8:12 AM 02/19/2019    2:22 PM 10/14/2017    2:28 PM  Fall Risk   Falls in the past year? 0 1 1 No  Number falls in past yr: 0 0    Injury with Fall? 0 1 1   Comment   Fell and hit his head and left hand injury   Risk for fall due to : Impaired vision;Impaired balance/gait     Follow up Falls prevention discussed       FALL RISK PREVENTION PERTAINING TO THE HOME:  Any stairs in or around the home? Yes  If so, are there any without handrails? No  Home free of loose throw rugs in walkways, pet beds, electrical cords, etc? Yes  Adequate lighting in your home to reduce risk of falls? Yes   ASSISTIVE DEVICES UTILIZED TO PREVENT FALLS:  Life alert? No  Use of a cane, walker or w/c? No  Grab bars in the bathroom? No  Shower chair or bench in shower? No   Elevated toilet seat or a handicapped toilet? No   TIMED UP AND GO:  Was the test performed? No .   Cognitive Function:        04/05/2022   10:36 AM  6CIT Screen  What Year? 0 points  What month? 0 points  What time? 0 points  Count back from 20 0 points  Months in reverse 0 points  Repeat phrase 0 points  Total Score 0 points    Immunizations Immunization History  Administered Date(s) Administered   Fluad Quad(high Dose 65+) 02/19/2019, 05/25/2021   Influenza Split 07/31/2011   Influenza-Unspecified 03/11/2014   PFIZER(Purple Top)SARS-COV-2 Vaccination 06/25/2019, 07/15/2019   Pneumococcal Conjugate-13 02/18/2018   Pneumococcal Polysaccharide-23 07/31/2011   Td 10/17/2009   Tdap 02/07/2019   Zoster, Live 09/05/2011    TDAP status: Up to date  Flu Vaccine status: Due, Education has been provided regarding the importance of this vaccine. Advised may receive this vaccine at local pharmacy or Health Dept. Aware to provide a copy of the vaccination record if obtained from local pharmacy or Health Dept. Verbalized acceptance and understanding.  Pneumococcal vaccine status: Up to date  Covid-19 vaccine status: Completed vaccines  Qualifies for Shingles Vaccine? Yes   Zostavax completed No   Shingrix Completed?: No.    Education has been provided regarding the importance of this vaccine. Patient has been advised to call insurance company to determine out of pocket expense if they have not yet received this vaccine. Advised may also receive vaccine at local pharmacy or Health Dept. Verbalized acceptance and understanding.  Screening Tests Health Maintenance  Topic Date Due   Zoster Vaccines- Shingrix (1 of 2) Never done   COVID-19 Vaccine (3 - Pfizer series) 09/09/2019   INFLUENZA VACCINE  01/09/2022   Medicare Annual Wellness (AWV)  05/06/2023   TETANUS/TDAP  02/06/2029   Pneumonia Vaccine 5+ Years old  Completed   HPV VACCINES  Aged Out    Health  Maintenance  Health Maintenance Due  Topic Date Due   Zoster Vaccines- Shingrix (1 of 2) Never done   COVID-19 Vaccine (3 - Pfizer series) 09/09/2019   INFLUENZA VACCINE  01/09/2022    Colorectal cancer screening: Type of screening: Cologuard. Completed 11/05/17. Repeat every as directed per provider  years   Additional Screening:   Vision Screening: Recommended annual ophthalmology exams for early detection of glaucoma and other disorders of the eye. Is the patient up to date with their annual eye exam?  Yes  Who is the provider or what is the name of the office in which the patient attends annual eye exams? [facility] opthalmology  If pt is not established with a provider, would they like to be referred to a provider to establish care? No .   Dental Screening: Recommended annual dental exams for proper oral hygiene  Community Resource Referral / Chronic Care Management: CRR required this visit?  No   CCM required this visit?  No      Plan:     I have personally reviewed and noted the following in the patient's chart:   Medical and social history Use of alcohol, tobacco or illicit drugs  Current medications and supplements including opioid prescriptions. Patient  is not currently taking opioid prescriptions. Functional ability and status Nutritional status Physical activity Advanced directives List of other physicians Hospitalizations, surgeries, and ER visits in previous 12 months Vitals Screenings to include cognitive, depression, and falls Referrals and appointments  In addition, I have reviewed and discussed with patient certain preventive protocols, quality metrics, and best practice recommendations. A written personalized care plan for preventive services as well as general preventive health recommendations were provided to patient.     Willette Brace, LPN   [id number]   Nurse Notes: pt is requesting lab work prior to physical so he can discuss results at  that time please advise

## 2022-04-05 NOTE — Patient Instructions (Signed)
Luis [name] , Thank you for taking time to come for your Medicare Wellness Visit. I appreciate your ongoing commitment to your health goals. Please review the following plan we discussed and let me know if I can assist you in the future.   These are the goals we discussed:  Goals      Patient Stated     Maintain health         This is a list of the screening recommended for you and due dates:  Health Maintenance  Topic Date Due   Zoster (Shingles) Vaccine (1 of 2) Never done   COVID-19 Vaccine (3 - Pfizer series) 09/09/2019   Flu Shot  01/09/2022   Medicare Annual Wellness Visit  05/06/2023   Tetanus Vaccine  02/06/2029   Pneumonia Vaccine  Completed   HPV Vaccine  Aged Out    Advanced directives: Please bring a copy of your health care power of attorney and living will to the office at your convenience.  Conditions/risks identified: maintain health   Next appointment: Follow up in one year for your annual wellness visit.   Preventive Care 49 Years and Older, Male  Preventive care refers to lifestyle choices and visits with your health care provider that can promote health and wellness. What does preventive care include? A yearly physical exam. This is also called an annual well check. Dental exams once or twice a year. Routine eye exams. Ask your health care provider how often you should have your eyes checked. Personal lifestyle choices, including: Daily care of your teeth and gums. Regular physical activity. Eating a healthy diet. Avoiding tobacco and drug use. Limiting alcohol use. Practicing safe sex. Taking low doses of aspirin every day. Taking vitamin and mineral supplements as recommended by your health care provider. What happens during an annual well check? The services and screenings done by your health care provider during your annual well check will depend on your age, overall health, lifestyle risk factors, and family history of disease. Counseling   Your health care provider may ask you questions about your: Alcohol use. Tobacco use. Drug use. Emotional well-being. Home and relationship well-being. Sexual activity. Eating habits. History of falls. Memory and ability to understand (cognition). Work and work Statistician. Screening  You may have the following tests or measurements: Height, weight, and BMI. Blood pressure. Lipid and cholesterol levels. These may be checked every 5 years, or more frequently if you are over 23 years old. Skin check. Lung cancer screening. You may have this screening every year starting at age 64 if you have a 30-pack-year history of smoking and currently smoke or have quit within the past 15 years. Fecal occult blood test (FOBT) of the stool. You may have this test every year starting at age 48. Flexible sigmoidoscopy or colonoscopy. You may have a sigmoidoscopy every 5 years or a colonoscopy every 10 years starting at age 15. Prostate cancer screening. Recommendations will vary depending on your family history and other risks. Hepatitis C blood test. Hepatitis B blood test. Sexually transmitted disease (STD) testing. Diabetes screening. This is done by checking your blood sugar (glucose) after you have not eaten for a while (fasting). You may have this done every 1-3 years. Abdominal aortic aneurysm (AAA) screening. You may need this if you are a current or former smoker. Osteoporosis. You may be screened starting at age 59 if you are at high risk. Talk with your health care provider about your test results, treatment options, and if  necessary, the need for more tests. Vaccines  Your health care provider may recommend certain vaccines, such as: Influenza vaccine. This is recommended every year. Tetanus, diphtheria, and acellular pertussis (Tdap, Td) vaccine. You may need a Td booster every 10 years. Zoster vaccine. You may need this after age 43. Pneumococcal 13-valent conjugate (PCV13) vaccine.  One dose is recommended after age 28. Pneumococcal polysaccharide (PPSV23) vaccine. One dose is recommended after age 12. Talk to your health care provider about which screenings and vaccines you need and how often you need them. This information is not intended to replace advice given to you by your health care provider. Make sure you discuss any questions you have with your health care provider. Document Released: 06/24/2015 Document Revised: 02/15/2016 Document Reviewed: 03/29/2015 Elsevier Interactive Patient Education  2017 [place] Prevention in the Home Falls can cause injuries. They can happen to people of all ages. There are many things you can do to make your home safe and to help prevent falls. What can I do on the outside of my home? Regularly fix the edges of walkways and driveways and fix any cracks. Remove anything that might make you trip as you walk through a door, such as a raised step or threshold. Trim any bushes or trees on the path to your home. Use bright outdoor lighting. Clear any walking paths of anything that might make someone trip, such as rocks or tools. Regularly check to see if handrails are loose or broken. Make sure that both sides of any steps have handrails. Any raised decks and porches should have guardrails on the edges. Have any leaves, snow, or ice cleared regularly. Use sand or salt on walking paths during winter. Clean up any spills in your garage right away. This includes oil or grease spills. What can I do in the bathroom? Use night lights. Install grab bars by the toilet and in the tub and shower. Do not use towel bars as grab bars. Use non-skid mats or decals in the tub or shower. If you need to sit down in the shower, use a plastic, non-slip stool. Keep the floor dry. Clean up any water that spills on the floor as soon as it happens. Remove soap buildup in the tub or shower regularly. Attach bath mats securely with double-sided  non-slip rug tape. Do not have throw rugs and other things on the floor that can make you trip. What can I do in the bedroom? Use night lights. Make sure that you have a light by your bed that is easy to reach. Do not use any sheets or blankets that are too big for your bed. They should not hang down onto the floor. Have a firm chair that has side arms. You can use this for support while you get dressed. Do not have throw rugs and other things on the floor that can make you trip. What can I do in the kitchen? Clean up any spills right away. Avoid walking on wet floors. Keep items that you use a lot in easy-to-reach places. If you need to reach something above you, use a strong step stool that has a grab bar. Keep electrical cords out of the way. Do not use floor polish or wax that makes floors slippery. If you must use wax, use non-skid floor wax. Do not have throw rugs and other things on the floor that can make you trip. What can I do with my stairs? Do not leave any items  on the stairs. Make sure that there are handrails on both sides of the stairs and use them. Fix handrails that are broken or loose. Make sure that handrails are as long as the stairways. Check any carpeting to make sure that it is firmly attached to the stairs. Fix any carpet that is loose or worn. Avoid having throw rugs at the top or bottom of the stairs. If you do have throw rugs, attach them to the floor with carpet tape. Make sure that you have a light switch at the top of the stairs and the bottom of the stairs. If you do not have them, ask someone to add them for you. What else can I do to help prevent falls? Wear shoes that: Do not have high heels. Have rubber bottoms. Are comfortable and fit you well. Are closed at the toe. Do not wear sandals. If you use a stepladder: Make sure that it is fully opened. Do not climb a closed stepladder. Make sure that both sides of the stepladder are locked into place. Ask  someone to hold it for you, if possible. Clearly mark and make sure that you can see: Any grab bars or handrails. First and last steps. Where the edge of each step is. Use tools that help you move around (mobility aids) if they are needed. These include: Canes. Walkers. Scooters. Crutches. Turn on the lights when you go into a dark area. Replace any light bulbs as soon as they burn out. Set up your furniture so you have a clear path. Avoid moving your furniture around. If any of your floors are uneven, fix them. If there are any pets around you, be aware of where they are. Review your medicines with your doctor. Some medicines can make you feel dizzy. This can increase your chance of falling. Ask your doctor what other things that you can do to help prevent falls. This information is not intended to replace advice given to you by your health care provider. Make sure you discuss any questions you have with your health care provider. Document Released: 03/24/2009 Document Revised: 11/03/2015 Document Reviewed: 07/02/2014 Elsevier Interactive Patient Education  2017 [employer].

## 2022-04-16 DIAGNOSIS — F4323 Adjustment disorder with mixed anxiety and depressed mood: Secondary | ICD-10-CM | POA: Diagnosis not present

## 2022-04-16 DIAGNOSIS — R69 Illness, unspecified: Secondary | ICD-10-CM | POA: Diagnosis not present

## 2022-04-30 DIAGNOSIS — F4323 Adjustment disorder with mixed anxiety and depressed mood: Secondary | ICD-10-CM | POA: Diagnosis not present

## 2022-04-30 DIAGNOSIS — R69 Illness, unspecified: Secondary | ICD-10-CM | POA: Diagnosis not present

## 2022-05-18 DIAGNOSIS — R69 Illness, unspecified: Secondary | ICD-10-CM | POA: Diagnosis not present

## 2022-05-18 DIAGNOSIS — F4323 Adjustment disorder with mixed anxiety and depressed mood: Secondary | ICD-10-CM | POA: Diagnosis not present

## 2022-05-21 ENCOUNTER — Other Ambulatory Visit: Payer: Self-pay | Admitting: *Deleted

## 2022-05-21 ENCOUNTER — Encounter: Payer: Self-pay | Admitting: Family Medicine

## 2022-05-21 DIAGNOSIS — E7849 Other hyperlipidemia: Secondary | ICD-10-CM

## 2022-05-21 NOTE — Telephone Encounter (Signed)
Ok to place lab order?

## 2022-05-21 NOTE — Telephone Encounter (Signed)
Ok to order same as last year.  Luis [name]. Jerline Pain, MD 05/21/2022 12:58 PM

## 2022-05-24 ENCOUNTER — Encounter: Payer: Self-pay | Admitting: *Deleted

## 2022-05-25 ENCOUNTER — Ambulatory Visit (INDEPENDENT_AMBULATORY_CARE_PROVIDER_SITE_OTHER): Payer: Medicare HMO | Admitting: Family Medicine

## 2022-05-25 ENCOUNTER — Encounter: Payer: Self-pay | Admitting: Family Medicine

## 2022-05-25 VITALS — BP 144/83 | HR 57 | Temp 97.7°F | Ht 72.0 in | Wt 190.0 lb

## 2022-05-25 DIAGNOSIS — Z0001 Encounter for general adult medical examination with abnormal findings: Secondary | ICD-10-CM | POA: Diagnosis not present

## 2022-05-25 DIAGNOSIS — F339 Major depressive disorder, recurrent, unspecified: Secondary | ICD-10-CM

## 2022-05-25 DIAGNOSIS — I10 Essential (primary) hypertension: Secondary | ICD-10-CM

## 2022-05-25 DIAGNOSIS — I779 Disorder of arteries and arterioles, unspecified: Secondary | ICD-10-CM

## 2022-05-25 DIAGNOSIS — R739 Hyperglycemia, unspecified: Secondary | ICD-10-CM

## 2022-05-25 DIAGNOSIS — E7849 Other hyperlipidemia: Secondary | ICD-10-CM

## 2022-05-25 DIAGNOSIS — I480 Paroxysmal atrial fibrillation: Secondary | ICD-10-CM

## 2022-05-25 DIAGNOSIS — C61 Malignant neoplasm of prostate: Secondary | ICD-10-CM

## 2022-05-25 DIAGNOSIS — E559 Vitamin D deficiency, unspecified: Secondary | ICD-10-CM | POA: Diagnosis not present

## 2022-05-25 DIAGNOSIS — Z23 Encounter for immunization: Secondary | ICD-10-CM

## 2022-05-25 DIAGNOSIS — Z79899 Other long term (current) drug therapy: Secondary | ICD-10-CM | POA: Diagnosis not present

## 2022-05-25 DIAGNOSIS — R69 Illness, unspecified: Secondary | ICD-10-CM | POA: Diagnosis not present

## 2022-05-25 LAB — LIPID PANEL
Cholesterol: 153 mg/dL (ref 0–200)
HDL: 64.4 mg/dL (ref >39.00)
LDL Cholesterol: 72 mg/dL (ref 0–99)
NonHDL: 88.87
Total CHOL/HDL Ratio: 2
Triglycerides: 83 mg/dL (ref 0.0–149.0)
VLDL: 16.6 mg/dL (ref 0.0–40.0)

## 2022-05-25 LAB — COMPREHENSIVE METABOLIC PANEL
ALT: 20 U/L (ref 0–53)
AST: 20 U/L (ref 0–37)
Albumin: 4.3 g/dL (ref 3.5–5.2)
Alkaline Phosphatase: 77 U/L (ref 39–117)
BUN: 16 mg/dL (ref 6–23)
CO2: 30 mEq/L (ref 19–32)
Calcium: 9.6 mg/dL (ref 8.4–10.5)
Chloride: 103 mEq/L (ref 96–112)
Creatinine, Ser: 1.2 mg/dL (ref 0.40–1.50)
GFR: 55.23 mL/min — ABNORMAL LOW (ref >60.00)
Glucose, Bld: 107 mg/dL — ABNORMAL HIGH (ref 70–99)
Potassium: 4.6 mEq/L (ref 3.5–5.1)
Sodium: 141 mEq/L (ref 135–145)
Total Bilirubin: 0.7 mg/dL (ref 0.2–1.2)
Total Protein: 6.5 g/dL (ref 6.0–8.3)

## 2022-05-25 LAB — HEMOGLOBIN A1C: Hgb A1c MFr Bld: 5.8 % (ref 4.6–6.5)

## 2022-05-25 LAB — CBC
HCT: 45.4 % (ref 39.0–52.0)
Hemoglobin: 15.5 g/dL (ref 13.0–17.0)
MCHC: 34.1 g/dL (ref 30.0–36.0)
MCV: 93.7 fl (ref 78.0–100.0)
Platelets: 221 10*3/uL (ref 150.0–400.0)
RBC: 4.84 Mil/uL (ref 4.22–5.81)
RDW: 14.3 % (ref 11.5–15.5)
WBC: 6.5 10*3/uL (ref 4.0–10.5)

## 2022-05-25 LAB — VITAMIN D 25 HYDROXY (VIT D DEFICIENCY, FRACTURES): VITD: 27.1 ng/mL — ABNORMAL LOW (ref 30.00–100.00)

## 2022-05-25 LAB — VITAMIN B12: Vitamin B-12: >1500 pg/mL — ABNORMAL HIGH (ref 211–911)

## 2022-05-25 LAB — TSH: TSH: 1.86 u[IU]/mL (ref 0.35–5.50)

## 2022-05-25 NOTE — Assessment & Plan Note (Signed)
Check lipids.  Currently on Zetia 10 mg daily and Lipitor 80 mg daily.

## 2022-05-25 NOTE — Assessment & Plan Note (Signed)
Continue management per cardiology.  On statin 80 mg daily and Eliquis 5 mg twice daily

## 2022-05-25 NOTE — Assessment & Plan Note (Signed)
Continue management per cardiology. 

## 2022-05-25 NOTE — Progress Notes (Signed)
Chief Complaint:  Luis [name] is a 87 y.o. male who presents today for his annual comprehensive physical exam.    Assessment/Plan:  Chronic Problems Addressed Today: Major depression, recurrent, chronic ([place]) Currently on Prozac 20 mg daily and just started Wellbutrin 150 mg daily as started by other providers.  Weight is overall stable.  No reported SI or HI.  He will let me know if this is not improving.  HLD (hyperlipidemia) Check lipids.  Currently on Zetia 10 mg daily and Lipitor 80 mg daily.  Prostate cancer [facility]) Following with urology.  Gets PSA checked with them.  HTN (hypertension) At goal per [facility] 8 off amlodipine.  Atrial fibrillation ([place]) Continue management per cardiology.  Mild carotid artery disease ([place]) Continue management per cardiology.  On statin 80 mg daily and Eliquis 5 mg twice daily   Preventative Healthcare: Flu shot given today. Check labs today.   Patient Counseling(The following topics were reviewed and/or handout was given):  -Nutrition: Stressed importance of moderation in sodium/caffeine intake, saturated fat and cholesterol, caloric balance, sufficient intake of fresh fruits, vegetables, and fiber.  -Stressed the importance of regular exercise.   -Substance Abuse: Discussed cessation/primary prevention of tobacco, alcohol, or other drug use; driving or other dangerous activities under the influence; availability of treatment for abuse.   -Injury prevention: Discussed safety belts, safety helmets, smoke detector, smoking near bedding or upholstery.   -Sexuality: Discussed sexually transmitted diseases, partner selection, use of condoms, avoidance of unintended pregnancy and contraceptive alternatives.   -Dental health: Discussed importance of regular tooth brushing, flossing, and dental visits.  -Health maintenance and immunizations reviewed. Please refer to Health maintenance section.  Return to care in 1 year for next preventative visit.      Subjective:  HPI:  He has no acute complaints today.   See A/P for status of chronic conditions.  Her last visit he has been following with urology for prostate cancer.  He was also started on Lexapro and Wellbutrin for depression.  He is not sure if these are helping yet.      05/25/2022    8:09 AM  Depression screen PHQ 2/9  Decreased Interest 0  Down, Depressed, Hopeless 0  PHQ - 2 Score 0    Health Maintenance Due  Topic Date Due   INFLUENZA VACCINE  01/09/2022     ROS: Per HPI, otherwise a complete review of systems was negative.   PMH:  The following were reviewed and entered/updated in epic: Past Medical History:  Diagnosis Date   CAD (coronary artery disease)    a. BMS-RCA, BMS-Cx, PTCA of OM1 in 2001. b. overlapping DES in RCA and PTCA to small distal Cx 2013. c. Cath 2016 -> stable CAD, suspect dyspnea unrelated to coronary disease.   COLONIC POLYPS, HX OF    Dr Earlean Shawl   DJD (degenerative joint disease)    Essential hypertension    Hyperlipidemia    Myocardial infarction ([place]) 08/1999   Persistent atrial fibrillation ([place])    a. dx 2016.   RBBB    TIA (transient ischemic attack)    Patient Active Problem List   Diagnosis Date Noted   Prostate cancer ([place]) 05/25/2021   Major depression, recurrent, chronic ([place]) 02/19/2019   Mild carotid artery disease ([place]) 02/19/2019   Insomnia 02/18/2018   Dizziness 11/27/2017   Unsteady gait 10/14/2017   TIA (transient ischemic attack) 11/29/2014   Atrial fibrillation ([place]) 11/29/2014   HTN (hypertension) 11/29/2014   HLD (hyperlipidemia)  10/27/2007   Coronary atherosclerosis 10/27/2007   COLONIC POLYPS, HX OF 10/27/2007   Past Surgical History:  Procedure Laterality Date   CARDIAC CATHETERIZATION  ~ 2002   CARDIAC CATHETERIZATION N/A 03/02/2015   Procedure: Left Heart Cath and Coronary Angiography;  Surgeon: Sherren Mocha, MD;  Location: [place] [facility];  Service: Cardiovascular;  Laterality: N/A;    CATARACT EXTRACTION W/ INTRAOCULAR LENS  IMPLANT, BILATERAL  ~ 2007   bilaterally   CORONARY ANGIOPLASTY  07/30/11   CORONARY ANGIOPLASTY WITH STENT PLACEMENT  2001; 07/30/11;   "2+3"   INGUINAL HERNIA REPAIR  2005   left   LEFT HEART CATHETERIZATION WITH CORONARY ANGIOGRAM N/A 07/30/2011   Procedure: LEFT HEART CATHETERIZATION WITH CORONARY ANGIOGRAM;  Surgeon: Burnell Blanks, MD;  Location: [facility] CATH LAB;  Service: Cardiovascular;  Laterality: N/A;   PERCUTANEOUS CORONARY STENT INTERVENTION (PCI-S)  07/30/2011   Procedure: PERCUTANEOUS CORONARY STENT INTERVENTION (PCI-S);  Surgeon: Burnell Blanks, MD;  Location: [facility] CATH LAB;  Service: Cardiovascular;;   RETINAL DETACHMENT SURGERY  05/2008; 06/2008; 07/2008   left; "all 3 surgeries have failed"    Family History  Problem Relation Age of Onset   Colon cancer Father        in 66s   Prostate cancer Father    Alcohol abuse Mother     Medications- reviewed and updated Current Outpatient Medications  Medication Sig Dispense Refill   buPROPion (WELLBUTRIN XL) 150 MG 24 hr tablet Take 150 mg by mouth daily.     apixaban (ELIQUIS) 5 MG TABS tablet Take 1 tablet (5 mg total) by mouth 2 (two) times daily. 180 tablet 1   atorvastatin (LIPITOR) 80 MG tablet TAKE ONE TABLET EACH DAY 30 tablet 11   ezetimibe (ZETIA) 10 MG tablet TAKE ONE TABLET EACH DAY AT BEDTIME 30 tablet 0   FLUoxetine (PROZAC) 20 MG capsule Take 20 mg by mouth daily.     Glucosamine HCl 1000 MG TABS Take 1 tablet by mouth daily.      ketoconazole (NIZORAL) 2 % cream Apply 1 application topically 2 (two) times daily. 60 g 0   nitroGLYCERIN (NITROSTAT) 0.4 MG SL tablet DISSOLVE 1 TABLET UNDER TONGUE AS NEEEDED FOR CHEST PAIN, MAY REPEAT IN 5 MINUTES FOR 2 DOSES 25 tablet 6   NON FORMULARY      nystatin-triamcinolone (MYCOLOG II) cream SMARTSIG:Topical Morning-Evening     Potassium Aminobenzoate 500 MG TABS Take 1 tablet by mouth daily.     vitamin E 1000 UNIT  capsule Take 2,000 Units by mouth daily.     No current facility-administered medications for this visit.    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; 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"   Drug use: No   Sexual activity: Not Currently  Other Topics Concern   Not on file  Social History Narrative   Not on file   Social Determinants of Health   Financial Resource Strain: Low Risk  (04/05/2022)   Overall Financial Resource Strain (CARDIA)    Difficulty of Paying Living Expenses: Not hard at all  Food Insecurity: No Food Insecurity (04/05/2022)   Hunger Vital Sign    Worried About Running Out of Food in the Last Year: Never true    Ran Out of Food in the Last Year: Never true  Transportation Needs: No Transportation Needs (04/05/2022)   PRAPARE - Hydrologist (Medical): No    Lack of Transportation (Non-Medical): No  Physical Activity: Sufficiently Active (04/05/2022)   Exercise Vital Sign    Days of Exercise per Week: 5 days    Minutes of Exercise per Session: 90 min  Stress: No Stress Concern Present (04/05/2022)   [place]    Feeling of Stress : Not at all  Social Connections: [place] (04/05/2022)   Social Connection and Isolation Panel [NHANES]    Frequency of Communication with Friends and Family: More  than three times a week    Frequency of Social Gatherings with Friends and Family: More than three times a week    Attends Religious Services: More than 4 times per year    Active Member of [employer] or Organizations: Yes    Attends Archivist Meetings: 1 to 4 times per year    Marital Status: Married        Objective:  Physical Exam: BP (!) 144/83   Pulse (!) 57   Temp 97.7 F (36.5 C) (Temporal)   Ht 6' (1.829 m)   Wt 190 lb (86.2 kg)   SpO2 98%   BMI 25.77 kg/m   Body mass index is 25.77 kg/m. Wt Readings from Last 3 Encounters:  05/25/22 190 lb (86.2 kg)  04/05/22 187 lb (84.8 kg)  03/05/22 187 lb (84.8 kg)   Gen: NAD, resting comfortably HEENT: TMs normal bilaterally. OP clear. No thyromegaly noted.  CV: RRR with no murmurs appreciated Pulm: NWOB, CTAB with no crackles, wheezes, or rhonchi GI: Normal bowel sounds present. Soft, Nontender, Nondistended. MSK: no edema, cyanosis, or clubbing noted Skin: warm, dry Neuro: CN2-12 grossly intact. Strength 5/5 in upper and lower extremities. Reflexes symmetric and intact bilaterally.  Psych: Normal affect and thought content     Cezar Misiaszek M. Jerline Pain, MD 05/25/2022 8:42 AM

## 2022-05-25 NOTE — Assessment & Plan Note (Signed)
Currently on Prozac 20 mg daily and just started Wellbutrin 150 mg daily as started by other providers.  Weight is overall stable.  No reported SI or HI.  He will let me know if this is not improving.

## 2022-05-25 NOTE — Assessment & Plan Note (Signed)
At goal per [facility] 8 off amlodipine.

## 2022-05-25 NOTE — Patient Instructions (Signed)
It was very nice to see you today!  We will check blood work today.  Will give your flu shot today.  Please let me know how you are doing with your depression if you need any other medication adjustments.  I will see back in year for your next physical.  Please like to see me sooner if needed.  Take care, Dr Jerline Pain  PLEASE NOTE:  If you had any lab tests please let us know if you have not heard back within a few days. You may see your results on mychart before we have a chance to review them but we will give you a call once they are reviewed by [place]. If we ordered any referrals today, please let us know if you have not heard from their office within the next week.   Please try these tips to maintain a healthy lifestyle:  Eat at least 3 REAL meals and 1-2 snacks per day.  Aim for no more than 5 hours between eating.  If you eat breakfast, please do so within one hour of getting up.   Each meal should contain half fruits/vegetables, one quarter protein, and one quarter carbs (no bigger than a computer mouse)  Cut down on sweet beverages. This includes juice, soda, and sweet tea.   Drink at least 1 glass of water with each meal and aim for at least 8 glasses per day  Exercise at least 150 minutes every week.     Preventive Care 79 Years and Older, Male Preventive care refers to lifestyle choices and visits with your health care provider that can promote health and wellness. Preventive care visits are also called wellness exams. What can I expect for my preventive care visit? Counseling During your preventive care visit, your health care provider may ask about your: Medical history, including: Past medical problems. Family medical history. History of falls. Current health, including: Emotional well-being. Home life and relationship well-being. Sexual activity. Memory and ability to understand (cognition). Lifestyle, including: Alcohol, nicotine or tobacco, and drug use. Access  to firearms. Diet, exercise, and sleep habits. Work and work Statistician. Sunscreen use. Safety issues such as seatbelt and bike helmet use. Physical exam Your health care provider will check your: Height and weight. These may be used to calculate your BMI (body mass index). BMI is a measurement that tells if you are at a healthy weight. Waist circumference. This measures the distance around your waistline. This measurement also tells if you are at a healthy weight and may help predict your risk of certain diseases, such as type 2 diabetes and high blood pressure. Heart rate and blood pressure. Body temperature. Skin for abnormal spots. What immunizations do I need?  Vaccines are usually given at various ages, according to a schedule. Your health care provider will recommend vaccines for you based on your age, medical history, and lifestyle or other factors, such as travel or where you work. What tests do I need? Screening Your health care provider may recommend screening tests for certain conditions. This may include: Lipid and cholesterol levels. Diabetes screening. This is done by checking your blood sugar (glucose) after you have not eaten for a while (fasting). Hepatitis C test. Hepatitis B test. HIV (human immunodeficiency virus) test. STI (sexually transmitted infection) testing, if you are at risk. Lung cancer screening. Colorectal cancer screening. Prostate cancer screening. Abdominal aortic aneurysm (AAA) screening. You may need this if you are a current or former smoker. Talk with your health care provider  about your test results, treatment options, and if necessary, the need for more tests. Follow these instructions at home: Eating and drinking  Eat a diet that includes fresh fruits and vegetables, whole grains, lean protein, and low-fat dairy products. Limit your intake of foods with high amounts of sugar, saturated fats, and salt. Take vitamin and mineral supplements as  recommended by your health care provider. Do not drink alcohol if your health care provider tells you not to drink. If you drink alcohol: Limit how much you have to 0-2 drinks a day. Know how much alcohol is in your drink. In the [place] one drink equals one 12 oz bottle of beer (355 mL), one 5 oz glass of wine (148 mL), or one 1 oz glass of hard liquor (44 mL). Lifestyle Brush your teeth every morning and night with fluoride toothpaste. Floss one time each day. Exercise for at least 30 minutes 5 or more days each week. Do not use any products that contain nicotine or tobacco. These products include cigarettes, chewing tobacco, and vaping devices, such as e-cigarettes. If you need help quitting, ask your health care provider. Do not use drugs. If you are sexually active, practice safe sex. Use a condom or other form of protection to prevent STIs. Take aspirin only as told by your health care provider. Make sure that you understand how much to take and what form to take. Work with your health care provider to find out whether it is safe and beneficial for you to take aspirin daily. Ask your health care provider if you need to take a cholesterol-lowering medicine (statin). Find healthy ways to manage stress, such as: Meditation, yoga, or listening to music. Journaling. Talking to a trusted person. Spending time with friends and family. Safety Always wear your seat belt while driving or riding in a vehicle. Do not drive: If you have been drinking alcohol. Do not ride with someone who has been drinking. When you are tired or distracted. While texting. If you have been using any mind-altering substances or drugs. Wear a helmet and other protective equipment during sports activities. If you have firearms in your house, make sure you follow all gun safety procedures. Minimize exposure to UV radiation to reduce your risk of skin cancer. What's next? Visit your health care provider once a year for  an annual wellness visit. Ask your health care provider how often you should have your eyes and teeth checked. Stay up to date on all vaccines. This information is not intended to replace advice given to you by your health care provider. Make sure you discuss any questions you have with your health care provider. Document Revised: 11/23/2020 Document Reviewed: 11/23/2020 Elsevier Patient Education  [place].

## 2022-05-25 NOTE — Assessment & Plan Note (Signed)
Following with urology.  Gets PSA checked with them.

## 2022-05-29 NOTE — Progress Notes (Signed)
Please inform patient of the following:  Vitamin D is a bit low.  He should continue with supplementation and we can recheck this again in 6 to 12 months.  His B12 levels are high. He should back off on his supplementation and we can recheck again in 6-12 months.   His A1c is better than last time we checked.  Cholesterol levels are at goal.  Do not need to make any changes to his treatment plan at this time. He should continue to work on diet and exercise and we can recheck in a year.  Everything else is stable.

## 2022-06-12 ENCOUNTER — Other Ambulatory Visit: Payer: Self-pay | Admitting: Cardiovascular Disease

## 2022-07-18 ENCOUNTER — Telehealth: Payer: Self-pay

## 2022-07-18 NOTE — Telephone Encounter (Signed)
Noted  

## 2022-07-18 NOTE — Telephone Encounter (Signed)
Pt returned call to confirm appointment.

## 2022-07-18 NOTE — Telephone Encounter (Signed)
Left detailed message for pt that he has been scheduled to see Dr. Henrene Pastor 07/25/22 at 11:40am. Asked that he call back to confirm appt.

## 2022-07-18 NOTE — Telephone Encounter (Signed)
-----   Message from Irene Shipper, MD sent at 07/17/2022  2:07 PM EST ----- Regarding: Appointment. Luis [name], Patient of mine, [name], call me regarding this patient.  [name] has been seen here remotely.  Having problems with abdominal pain.  Wants to be seen.  Wanted to see me. Please schedule him to see me in the office Wednesday, February 14 at 11:40 AM. Please take [name] out of that slot (she is in the ICU at [facility]). Please contact this patient. Thanks J.

## 2022-07-23 ENCOUNTER — Other Ambulatory Visit: Payer: Self-pay | Admitting: Cardiovascular Disease

## 2022-07-23 NOTE — Telephone Encounter (Signed)
Pt last saw Ambrose Pancoast, NP on 03/05/22, last labs 05/25/22 Creat 1.20, age 88, weight 86.2 kg, based on specified criteria pt is on appropriate dosage of Eliquis 30m BID for afib.  Will refill rx.

## 2022-07-25 ENCOUNTER — Ambulatory Visit: Payer: Medicare HMO | Admitting: Internal Medicine

## 2022-07-25 ENCOUNTER — Encounter: Payer: Self-pay | Admitting: Internal Medicine

## 2022-07-25 ENCOUNTER — Other Ambulatory Visit (INDEPENDENT_AMBULATORY_CARE_PROVIDER_SITE_OTHER): Payer: Medicare HMO

## 2022-07-25 VITALS — BP 138/80 | HR 60 | Ht 71.0 in | Wt 190.4 lb

## 2022-07-25 DIAGNOSIS — K5900 Constipation, unspecified: Secondary | ICD-10-CM

## 2022-07-25 DIAGNOSIS — R1033 Periumbilical pain: Secondary | ICD-10-CM

## 2022-07-25 LAB — BASIC METABOLIC PANEL
BUN: 14 mg/dL (ref 6–23)
CO2: 29 mEq/L (ref 19–32)
Calcium: 9.4 mg/dL (ref 8.4–10.5)
Chloride: 104 mEq/L (ref 96–112)
Creatinine, Ser: 1.2 mg/dL (ref 0.40–1.50)
GFR: 55.16 mL/min — ABNORMAL LOW (ref >60.00)
Glucose, Bld: 84 mg/dL (ref 70–99)
Potassium: 4.1 mEq/L (ref 3.5–5.1)
Sodium: 139 mEq/L (ref 135–145)

## 2022-07-25 NOTE — Progress Notes (Signed)
HISTORY OF PRESENT ILLNESS:  Luis [name] is a 88 y.o. male, former prior to [employer], with multiple medical problems as listed below who presents today for evaluation of abdominal pain and chronic constipation.  He is on chronic Eliquis therapy.  He is referred by [name] stating patient was highly concerned and wanted to be seen ASAP.  Patient did undergo colonoscopy with Dr. Richmond Campbell November 2002.  He was found to have hyperplastic colon polyp, melanosis coli, and nonbleeding cecal AVMs.  He was also seen in this office by 2 of my colleagues, Dr. Ardis Hughs in August 2019, and Dr. Rush Landmark in November 2019.  Patient did have negative Cologuard testing in May 2019.  He continues with lifelong constipation.  He takes over-the-counter agents which works suboptimally.  He describes having a bowel movement about 3 times per week.  His new complaint is that of persistent periumbilical discomfort of 2 and half weeks duration.  This is associated with increased intestinal gas.  He has had decreased appetite but no weight loss.  He is worried about cancer.  He does feel that a good bowel movement helps the discomfort.  Cannot identify any exacerbating features.  He does have urologic issues for which she is seeing his urologist, Dr. Jeffie Pollock, next week.  Blood work from May 25, 2022 shows unremarkable comprehensive metabolic panel.  Normal liver test.  Unremarkable CBC.  Normal hemoglobin of 15.5.  No relevant recent imaging.  REVIEW OF SYSTEMS:  All non-GI ROS negative unless otherwise stated in the HPI except for cough, fatigue, sleeping problems, urinary frequency  Past Medical History:  Diagnosis Date   Anal abscess    CAD (coronary artery disease)    a. BMS-RCA, BMS-Cx, PTCA of OM1 in 2001. b. overlapping DES in RCA and PTCA to small distal Cx 2013. c. Cath 2016 -> stable CAD, suspect dyspnea unrelated to coronary disease.   COLONIC POLYPS, HX OF    Dr Earlean Shawl   DJD  (degenerative joint disease)    Essential hypertension    Hyperlipidemia    Myocardial infarction ([place]) 08/1999   Persistent atrial fibrillation ([place])    a. dx 2016.   RBBB    TIA (transient ischemic attack)     Past Surgical History:  Procedure Laterality Date   CARDIAC CATHETERIZATION  ~ 2002   CARDIAC CATHETERIZATION N/A 03/02/2015   Procedure: Left Heart Cath and Coronary Angiography;  Surgeon: Sherren Mocha, MD;  Location: [place] [facility];  Service: Cardiovascular;  Laterality: N/A;   CATARACT EXTRACTION W/ INTRAOCULAR LENS  IMPLANT, BILATERAL  ~ 2007   bilaterally   CORONARY ANGIOPLASTY  07/30/11   CORONARY ANGIOPLASTY WITH STENT PLACEMENT  2001; 07/30/11;   "2+3"   INGUINAL HERNIA REPAIR  2005   left   LEFT HEART CATHETERIZATION WITH CORONARY ANGIOGRAM N/A 07/30/2011   Procedure: LEFT HEART CATHETERIZATION WITH CORONARY ANGIOGRAM;  Surgeon: Burnell Blanks, MD;  Location: [facility] CATH LAB;  Service: Cardiovascular;  Laterality: N/A;   PERCUTANEOUS CORONARY STENT INTERVENTION (PCI-S)  07/30/2011   Procedure: PERCUTANEOUS CORONARY STENT INTERVENTION (PCI-S);  Surgeon: Burnell Blanks, MD;  Location: [facility] CATH LAB;  Service: Cardiovascular;;   RETINAL DETACHMENT SURGERY  05/2008; 06/2008; 07/2008   left; "all 3 surgeries have failed"    Social History JOELLE BRANCO  reports that he quit smoking about 23 years ago. His smoking use included cigarettes. He has a 20.00 pack-year smoking history. He has never used smokeless tobacco. He  reports that he does not drink alcohol and does not use drugs.  family history includes Alcohol abuse in his mother; Alcoholism in his sister; Colon cancer in his father.  No Known Allergies     PHYSICAL EXAMINATION: Vital signs: BP 138/80 (BP Location: Left Arm, Patient Position: Sitting, Cuff Size: Normal)   Pulse 60 Comment: irregular  Ht 5' 11"$  (1.803 m)   Wt 190 lb 6 oz (86.4 kg)   BMI 26.55 kg/m   Constitutional: generally  well-appearing, no acute distress Psychiatric: alert and oriented x3, cooperative Eyes: extraocular movements intact, anicteric, conjunctiva pink Mouth: oral pharynx moist, no lesions Neck: supple no lymphadenopathy Cardiovascular: heart regular rate and rhythm, no murmur Lungs: clear to auscultation bilaterally Abdomen: soft, nontender, nondistended, no obvious ascites, no peritoneal signs, normal bowel sounds, no organomegaly Rectal: Omitted Extremities: no clubbing or cyanosis.  1+ lower extremity edema bilaterally Skin: no lesions on visible extremities Neuro: No focal deficits.  Cranial nerves intact  ASSESSMENT:  1.  2 and half weeks history of periumbilical abdominal pain.  Persistent.  New.  May be related to constipation.  Rule out other 2.  Chronic constipation.  Ongoing 3.  Previous colonoscopy November 2002 as described.  Negative Cologuard testing May 2019   PLAN:  1.  Recommend MiraLAX daily for constipation.  I instructed him on the proper way to titrate to achieve desired effect. 2.  Schedule contrast-enhanced CT scan of the abdomen pelvis to evaluate persistent periumbilical abdominal pain.  Will contact him after the results are available. 3.  Ongoing general medical care with Dr. Jerline Pain A total time of 45 minutes was spent preparing to see the patient, reviewing data, obtaining comprehensive history, performing medically appropriate physical examination, counseling and educating the patient regarding the above listed issues, ordering medication, ordering advanced radiology study, and documenting clinical information in the health record.

## 2022-07-25 NOTE — Patient Instructions (Signed)
_______________________________________________________  If your blood pressure at your visit was 140/90 or greater, please contact your primary care physician to follow up on this.  _______________________________________________________  If you are age 86 or older, your body mass index should be between 23-30. Your Body mass index is 26.55 kg/m. If this is out of the aforementioned range listed, please consider follow up with your Primary Care Provider.  If you are age 56 or younger, your body mass index should be between 19-25. Your Body mass index is 26.55 kg/m. If this is out of the aformentioned range listed, please consider follow up with your Primary Care Provider.   ________________________________________________________  The [place] providers would like to encourage you to use [facility] to communicate with providers for non-urgent requests or questions.  Due to long hold times on the telephone, sending your provider a message by [facility] may be a faster and more efficient way to get a response.  Please allow 48 business hours for a response.  Please remember that this is for non-urgent requests.  _______________________________________________________  Your provider has requested that you go to the basement level for lab work before leaving today. Press "B" on the elevator. The lab is located at the first door on the left as you exit the elevator.  You have been scheduled for a CT scan of the abdomen and pelvis at [facility], 1st floor Radiology. You are scheduled on 08/07/2022 at 3:30pm. You should arrive 15 minutes prior to your appointment time for registration.  Please follow the written instructions below on the day of your exam:   1) Do not eat anything after 11:30am (4 hours prior to your test)    You may take any medications as prescribed with a small amount of water, if necessary. If you take any of the following medications: METFORMIN, GLUCOPHAGE, [place],  AVANDAMET, RIOMET, FORTAMET, [place] MET, JANUMET, GLUMETZA or METAGLIP, you MAY be asked to HOLD this medication 48 hours AFTER the exam.    If you have any questions regarding your exam or if you need to reschedule, you may call [facility] at [phone] between the hours of 8:00 am and 5:00 pm, Monday-Friday.   Take Miralax daily - adjust as needed

## 2022-07-26 DIAGNOSIS — R972 Elevated prostate specific antigen [PSA]: Secondary | ICD-10-CM | POA: Diagnosis not present

## 2022-07-26 LAB — PSA: PSA: 11.8

## 2022-08-01 DIAGNOSIS — N401 Enlarged prostate with lower urinary tract symptoms: Secondary | ICD-10-CM | POA: Diagnosis not present

## 2022-08-01 DIAGNOSIS — R3915 Urgency of urination: Secondary | ICD-10-CM | POA: Diagnosis not present

## 2022-08-01 DIAGNOSIS — R972 Elevated prostate specific antigen [PSA]: Secondary | ICD-10-CM | POA: Diagnosis not present

## 2022-08-06 ENCOUNTER — Other Ambulatory Visit: Payer: Self-pay | Admitting: Urology

## 2022-08-06 DIAGNOSIS — R972 Elevated prostate specific antigen [PSA]: Secondary | ICD-10-CM

## 2022-08-07 ENCOUNTER — Ambulatory Visit (HOSPITAL_COMMUNITY): Payer: Medicare HMO

## 2022-08-08 ENCOUNTER — Ambulatory Visit (HOSPITAL_COMMUNITY)
Admission: RE | Admit: 2022-08-08 | Discharge: 2022-08-08 | Disposition: A | Discharge status: Home / Self Care | Payer: Medicare HMO | Source: Ambulatory Visit | Attending: Internal Medicine | Admitting: Internal Medicine

## 2022-08-08 ENCOUNTER — Encounter (HOSPITAL_COMMUNITY): Payer: Self-pay

## 2022-08-08 DIAGNOSIS — K5900 Constipation, unspecified: Secondary | ICD-10-CM | POA: Diagnosis not present

## 2022-08-08 DIAGNOSIS — N281 Cyst of kidney, acquired: Secondary | ICD-10-CM | POA: Diagnosis not present

## 2022-08-08 DIAGNOSIS — R1033 Periumbilical pain: Secondary | ICD-10-CM | POA: Insufficient documentation

## 2022-08-08 DIAGNOSIS — N400 Enlarged prostate without lower urinary tract symptoms: Secondary | ICD-10-CM | POA: Diagnosis not present

## 2022-08-08 MED ORDER — SODIUM CHLORIDE (PF) 0.9 % IJ SOLN
INTRAMUSCULAR | Status: AC
  Filled 2022-08-08: qty 50

## 2022-08-08 MED ORDER — IOHEXOL 300 MG/ML  SOLN
100.0000 mL | Freq: Once | INTRAMUSCULAR | Status: AC | PRN
  Administered 2022-08-08: 100 mL via INTRAVENOUS

## 2022-08-09 ENCOUNTER — Encounter: Payer: Self-pay | Admitting: Internal Medicine

## 2022-09-12 ENCOUNTER — Encounter: Payer: Self-pay | Admitting: Internal Medicine

## 2022-09-12 ENCOUNTER — Ambulatory Visit: Payer: Medicare HMO | Admitting: Internal Medicine

## 2022-09-12 VITALS — BP 130/62 | HR 65 | Ht 72.0 in | Wt 183.0 lb

## 2022-09-12 DIAGNOSIS — K5900 Constipation, unspecified: Secondary | ICD-10-CM

## 2022-09-12 DIAGNOSIS — R109 Unspecified abdominal pain: Secondary | ICD-10-CM

## 2022-09-12 DIAGNOSIS — R1033 Periumbilical pain: Secondary | ICD-10-CM | POA: Diagnosis not present

## 2022-09-12 NOTE — Patient Instructions (Addendum)
Please follow up as needed.  ______________________________________________________  If your blood pressure at your visit was 140/90 or greater, please contact your primary care physician to follow up on this.  _______________________________________________________  If you are age 87 or older, your body mass index should be between 23-30. Your Body mass index is 24.82 kg/m. If this is out of the aforementioned range listed, please consider follow up with your Primary Care Provider.  If you are age 79 or younger, your body mass index should be between 19-25. Your Body mass index is 24.82 kg/m. If this is out of the aformentioned range listed, please consider follow up with your Primary Care Provider.   ________________________________________________________  The [place] [facility] providers would like to encourage you to use [facility] to communicate with providers for non-urgent requests or questions.  Due to long hold times on the telephone, sending your provider a message by [facility] may be a faster and more efficient way to get a response.  Please allow 48 business hours for a response.  Please remember that this is for non-urgent requests.  _______________________________________________________ It was a pleasure to see you today!  Thank you for trusting me with your gastrointestinal care!

## 2022-09-12 NOTE — Progress Notes (Signed)
HISTORY OF PRESENT ILLNESS:  Luis [name] is a 88 y.o. male, former proprietor of [employer], with multiple medical problems, who I saw in the office July 25, 2022 regarding periumbilical discomfort of several weeks duration.  See that dictation for details.  This was on the background of chronic constipation.  He was concerned about cancer.  He was advised to take MiraLAX more regularly.  We set him up for a contrast-enhanced CT scan of the abdomen and pelvis which was performed August 08, 2022.  There was no acute findings.  No hernia.  He did have prominent stool throughout the colon consistent with constipation.  No evidence of cancer.  Previous colonoscopy in 2002 with hyperplastic polyp and angiodysplasia.  Patient presents today for follow-up.  He just returned from [place].  He continues to notice fairly constant periumbilical discomfort just above the umbilicus.  He has had improvement in his bowel habits with increased use of MiraLAX, now moving his bowels daily.  Despite this, the discomfort remains present.  He does not identify any factors that either exacerbate or relieve the discomfort.  It is not affected by activity, meals, time of day, or bowel movements.  He does take a CBD gummy at night to help him rest.  He remains worried about cancer.  REVIEW OF SYSTEMS:  All non-GI ROS negative unless otherwise stated in the HPI except for irregular heartbeat, shortness of breath  Past Medical History:  Diagnosis Date   Anal abscess    CAD (coronary artery disease)    a. BMS-RCA, BMS-Cx, PTCA of OM1 in 2001. b. overlapping DES in RCA and PTCA to small distal Cx 2013. c. Cath 2016 -> stable CAD, suspect dyspnea unrelated to coronary disease.   COLONIC POLYPS, HX OF    Dr Earlean Shawl   DJD (degenerative joint disease)    Essential hypertension    Hyperlipidemia    Myocardial infarction 08/1999   Persistent atrial fibrillation    a. dx 2016.   RBBB    TIA  (transient ischemic attack)     Past Surgical History:  Procedure Laterality Date   CARDIAC CATHETERIZATION  ~ 2002   CARDIAC CATHETERIZATION N/A 03/02/2015   Procedure: Left Heart Cath and Coronary Angiography;  Surgeon: Sherren Mocha, MD;  Location: [facility];  Service: Cardiovascular;  Laterality: N/A;   CATARACT EXTRACTION W/ INTRAOCULAR LENS  IMPLANT, BILATERAL  ~ 2007   bilaterally   CORONARY ANGIOPLASTY  07/30/11   CORONARY ANGIOPLASTY WITH STENT PLACEMENT  2001; 07/30/11;   "2+3"   INGUINAL HERNIA REPAIR  2005   left   LEFT HEART CATHETERIZATION WITH CORONARY ANGIOGRAM N/A 07/30/2011   Procedure: LEFT HEART CATHETERIZATION WITH CORONARY ANGIOGRAM;  Surgeon: Burnell Blanks, MD;  Location: [facility] CATH LAB;  Service: Cardiovascular;  Laterality: N/A;   PERCUTANEOUS CORONARY STENT INTERVENTION (PCI-S)  07/30/2011   Procedure: PERCUTANEOUS CORONARY STENT INTERVENTION (PCI-S);  Surgeon: Burnell Blanks, MD;  Location: [facility] CATH LAB;  Service: Cardiovascular;;   RETINAL DETACHMENT SURGERY  05/2008; 06/2008; 07/2008   left; "all 3 surgeries have failed"    Social History Luis [name]  reports that he quit smoking about 23 years ago. His smoking use included cigarettes. He has a 20.00 pack-year smoking history. He has never used smokeless tobacco. He reports that he does not drink alcohol and does not use drugs.  family history includes Alcohol abuse in his mother; Alcoholism in his sister; Colon cancer in his  father.  No Known Allergies     PHYSICAL EXAMINATION: Vital signs: BP 130/62   Pulse 65   Ht 6' (1.829 m)   Wt 183 lb (83 kg)   BMI 24.82 kg/m   Constitutional: generally well-appearing, no acute distress Psychiatric: alert and oriented x3, cooperative Eyes: extraocular movements intact, anicteric, conjunctiva pink Mouth: oral pharynx moist, no lesions Neck: supple no lymphadenopathy Cardiovascular: heart regular rate and rhythm, no murmur Lungs:  clear to auscultation bilaterally Abdomen: soft, slight fascial defect above the umbilicus in the midline which is tender and reproduces the patient's discomfort, no hernia, nondistended, no obvious ascites, no peritoneal signs, normal bowel sounds, no organomegaly Rectal: Omitted Extremities: no clubbing, cyanosis or lower extremity edema bilaterally Skin: no lesions on visible extremities Neuro: No focal deficits.  Nerves intact  ASSESSMENT:  1.  Musculoskeletal abdominal wall discomfort due to slight fascial defect above the umbilicus.  No hernia. 2.  Multiple medical problems 3.  Health-related anxiety 4.  Chronic constipation 5.  Colonoscopy 2002 with hyperplastic polyp  PLAN:  1.  Reassurance 2.  Analgesia as needed. 3.  Continue MiraLAX 4.  Resume general medical care with your PCP A total time of 30 minutes was spent preparing to see the patient, reviewing data, obtaining interval history, performing medically appropriate physical exam, counseling and educating the patient regarding above listed issues, answering questions, directing symptomatic therapies, and documenting clinical information in the health record.

## 2022-10-24 DIAGNOSIS — R972 Elevated prostate specific antigen [PSA]: Secondary | ICD-10-CM | POA: Diagnosis not present

## 2022-10-24 LAB — PSA: PSA: 11.8

## 2022-10-30 ENCOUNTER — Telehealth: Payer: Self-pay | Admitting: Pharmacist

## 2022-10-30 ENCOUNTER — Other Ambulatory Visit: Payer: Self-pay

## 2022-10-30 MED ORDER — ATORVASTATIN CALCIUM 80 MG PO TABS: ORAL_TABLET | ORAL | 0 refills | Status: DC

## 2022-10-30 NOTE — Telephone Encounter (Signed)
Pharmacy Quality Measure Review  This patient is appearing on the insurance-provided list for being at risk of failing the adherence measure for cholesterol medications this calendar year.   Medication: atorvastatin 80mg  daily  Recent refill history:    Patient been filling statin regularly though not always on time.  Spoke with [name] and he rarely misses doses. He did mention that he would prefer to have 90 day supply of atorvastatin if possible as most of his other medications are for 90 day supply.  LM on VM at [facility] refill line requesting new Rx for 90 day supply.   Henrene Pastor, PharmD Clinical Pharmacist [facility]  [facility]

## 2022-10-30 NOTE — Telephone Encounter (Signed)
Pt's medication was sent to pt's pharmacy as requested. Confirmation received.  °

## 2022-10-31 DIAGNOSIS — R3915 Urgency of urination: Secondary | ICD-10-CM | POA: Diagnosis not present

## 2022-10-31 DIAGNOSIS — R972 Elevated prostate specific antigen [PSA]: Secondary | ICD-10-CM | POA: Diagnosis not present

## 2022-10-31 DIAGNOSIS — N401 Enlarged prostate with lower urinary tract symptoms: Secondary | ICD-10-CM | POA: Diagnosis not present

## 2022-11-11 ENCOUNTER — Emergency Department (HOSPITAL_COMMUNITY): Payer: Medicare HMO

## 2022-11-11 ENCOUNTER — Inpatient Hospital Stay (HOSPITAL_COMMUNITY)
Admission: EM | Admit: 2022-11-11 | Discharge: 2022-11-16 | DRG: 481 | Disposition: A | Discharge status: Skilled Nursing Facility | Payer: Medicare HMO | Attending: Student | Admitting: Student

## 2022-11-11 DIAGNOSIS — R339 Retention of urine, unspecified: Secondary | ICD-10-CM | POA: Diagnosis not present

## 2022-11-11 DIAGNOSIS — Z597 Insufficient social insurance and welfare support: Secondary | ICD-10-CM

## 2022-11-11 DIAGNOSIS — S4992XA Unspecified injury of left shoulder and upper arm, initial encounter: Secondary | ICD-10-CM | POA: Diagnosis not present

## 2022-11-11 DIAGNOSIS — Z955 Presence of coronary angioplasty implant and graft: Secondary | ICD-10-CM | POA: Diagnosis not present

## 2022-11-11 DIAGNOSIS — Z743 Need for continuous supervision: Secondary | ICD-10-CM | POA: Diagnosis not present

## 2022-11-11 DIAGNOSIS — Z800 Family history of malignant neoplasm of digestive organs: Secondary | ICD-10-CM | POA: Diagnosis not present

## 2022-11-11 DIAGNOSIS — E785 Hyperlipidemia, unspecified: Secondary | ICD-10-CM | POA: Diagnosis not present

## 2022-11-11 DIAGNOSIS — M80052A Age-related osteoporosis with current pathological fracture, left femur, initial encounter for fracture: Principal | ICD-10-CM | POA: Diagnosis present

## 2022-11-11 DIAGNOSIS — S7292XA Unspecified fracture of left femur, initial encounter for closed fracture: Secondary | ICD-10-CM

## 2022-11-11 DIAGNOSIS — Z9842 Cataract extraction status, left eye: Secondary | ICD-10-CM | POA: Diagnosis not present

## 2022-11-11 DIAGNOSIS — G8911 Acute pain due to trauma: Secondary | ICD-10-CM | POA: Diagnosis not present

## 2022-11-11 DIAGNOSIS — I2510 Atherosclerotic heart disease of native coronary artery without angina pectoris: Secondary | ICD-10-CM | POA: Diagnosis not present

## 2022-11-11 DIAGNOSIS — W1789XA Other fall from one level to another, initial encounter: Secondary | ICD-10-CM | POA: Diagnosis present

## 2022-11-11 DIAGNOSIS — Z811 Family history of alcohol abuse and dependence: Secondary | ICD-10-CM | POA: Diagnosis not present

## 2022-11-11 DIAGNOSIS — Z86010 Personal history of colonic polyps: Secondary | ICD-10-CM | POA: Diagnosis not present

## 2022-11-11 DIAGNOSIS — D631 Anemia in chronic kidney disease: Secondary | ICD-10-CM | POA: Diagnosis not present

## 2022-11-11 DIAGNOSIS — F32A Depression, unspecified: Secondary | ICD-10-CM | POA: Diagnosis present

## 2022-11-11 DIAGNOSIS — M25462 Effusion, left knee: Secondary | ICD-10-CM | POA: Diagnosis not present

## 2022-11-11 DIAGNOSIS — R739 Hyperglycemia, unspecified: Secondary | ICD-10-CM | POA: Diagnosis present

## 2022-11-11 DIAGNOSIS — S72002A Fracture of unspecified part of neck of left femur, initial encounter for closed fracture: Secondary | ICD-10-CM | POA: Diagnosis not present

## 2022-11-11 DIAGNOSIS — Z7901 Long term (current) use of anticoagulants: Secondary | ICD-10-CM | POA: Diagnosis not present

## 2022-11-11 DIAGNOSIS — E559 Vitamin D deficiency, unspecified: Secondary | ICD-10-CM | POA: Diagnosis not present

## 2022-11-11 DIAGNOSIS — W19XXXA Unspecified fall, initial encounter: Principal | ICD-10-CM

## 2022-11-11 DIAGNOSIS — Z9841 Cataract extraction status, right eye: Secondary | ICD-10-CM | POA: Diagnosis not present

## 2022-11-11 DIAGNOSIS — M898X9 Other specified disorders of bone, unspecified site: Secondary | ICD-10-CM | POA: Diagnosis present

## 2022-11-11 DIAGNOSIS — Z79899 Other long term (current) drug therapy: Secondary | ICD-10-CM

## 2022-11-11 DIAGNOSIS — R7303 Prediabetes: Secondary | ICD-10-CM | POA: Diagnosis not present

## 2022-11-11 DIAGNOSIS — S72009A Fracture of unspecified part of neck of unspecified femur, initial encounter for closed fracture: Secondary | ICD-10-CM | POA: Diagnosis present

## 2022-11-11 DIAGNOSIS — Z8673 Personal history of transient ischemic attack (TIA), and cerebral infarction without residual deficits: Secondary | ICD-10-CM | POA: Diagnosis not present

## 2022-11-11 DIAGNOSIS — D62 Acute posthemorrhagic anemia: Secondary | ICD-10-CM | POA: Diagnosis not present

## 2022-11-11 DIAGNOSIS — I119 Hypertensive heart disease without heart failure: Secondary | ICD-10-CM | POA: Diagnosis not present

## 2022-11-11 DIAGNOSIS — I4891 Unspecified atrial fibrillation: Secondary | ICD-10-CM | POA: Diagnosis present

## 2022-11-11 DIAGNOSIS — F419 Anxiety disorder, unspecified: Secondary | ICD-10-CM | POA: Diagnosis present

## 2022-11-11 DIAGNOSIS — I4819 Other persistent atrial fibrillation: Secondary | ICD-10-CM | POA: Diagnosis not present

## 2022-11-11 DIAGNOSIS — S72102A Unspecified trochanteric fracture of left femur, initial encounter for closed fracture: Secondary | ICD-10-CM | POA: Diagnosis not present

## 2022-11-11 DIAGNOSIS — I129 Hypertensive chronic kidney disease with stage 1 through stage 4 chronic kidney disease, or unspecified chronic kidney disease: Secondary | ICD-10-CM | POA: Diagnosis present

## 2022-11-11 DIAGNOSIS — Y92009 Unspecified place in unspecified non-institutional (private) residence as the place of occurrence of the external cause: Secondary | ICD-10-CM

## 2022-11-11 DIAGNOSIS — I252 Old myocardial infarction: Secondary | ICD-10-CM

## 2022-11-11 DIAGNOSIS — N1831 Chronic kidney disease, stage 3a: Secondary | ICD-10-CM | POA: Diagnosis present

## 2022-11-11 DIAGNOSIS — Z87891 Personal history of nicotine dependence: Secondary | ICD-10-CM

## 2022-11-11 DIAGNOSIS — I4510 Unspecified right bundle-branch block: Secondary | ICD-10-CM | POA: Diagnosis present

## 2022-11-11 DIAGNOSIS — Z043 Encounter for examination and observation following other accident: Secondary | ICD-10-CM | POA: Diagnosis not present

## 2022-11-11 DIAGNOSIS — R338 Other retention of urine: Secondary | ICD-10-CM | POA: Diagnosis not present

## 2022-11-11 DIAGNOSIS — Z961 Presence of intraocular lens: Secondary | ICD-10-CM | POA: Diagnosis not present

## 2022-11-11 DIAGNOSIS — I10 Essential (primary) hypertension: Secondary | ICD-10-CM | POA: Diagnosis not present

## 2022-11-11 DIAGNOSIS — S72002D Fracture of unspecified part of neck of left femur, subsequent encounter for closed fracture with routine healing: Secondary | ICD-10-CM | POA: Diagnosis not present

## 2022-11-11 DIAGNOSIS — G459 Transient cerebral ischemic attack, unspecified: Secondary | ICD-10-CM | POA: Diagnosis not present

## 2022-11-11 DIAGNOSIS — S72142A Displaced intertrochanteric fracture of left femur, initial encounter for closed fracture: Secondary | ICD-10-CM | POA: Diagnosis not present

## 2022-11-11 LAB — BASIC METABOLIC PANEL
Anion gap: 9 (ref 5–15)
BUN: 13 mg/dL (ref 8–23)
CO2: 22 mmol/L (ref 22–32)
Calcium: 8.8 mg/dL — ABNORMAL LOW (ref 8.9–10.3)
Chloride: 105 mmol/L (ref 98–111)
Creatinine, Ser: 1.25 mg/dL — ABNORMAL HIGH (ref 0.61–1.24)
GFR, Estimated: 56 mL/min — ABNORMAL LOW (ref >60)
Glucose, Bld: 144 mg/dL — ABNORMAL HIGH (ref 70–99)
Potassium: 4.2 mmol/L (ref 3.5–5.1)
Sodium: 136 mmol/L (ref 135–145)

## 2022-11-11 LAB — CBC
HCT: 42.8 % (ref 39.0–52.0)
Hemoglobin: 14.5 g/dL (ref 13.0–17.0)
MCH: 31.7 pg (ref 26.0–34.0)
MCHC: 33.9 g/dL (ref 30.0–36.0)
MCV: 93.7 fL (ref 80.0–100.0)
Platelets: 188 10*3/uL (ref 150–400)
RBC: 4.57 MIL/uL (ref 4.22–5.81)
RDW: 13.5 % (ref 11.5–15.5)
WBC: 7.1 10*3/uL (ref 4.0–10.5)
nRBC: 0 % (ref 0.0–0.2)

## 2022-11-11 MED ORDER — HYDROMORPHONE HCL 1 MG/ML IJ SOLN
0.5000 mg | INTRAMUSCULAR | Status: DC | PRN
  Administered 2022-11-11 – 2022-11-13 (×9): 0.5 mg via INTRAVENOUS
  Filled 2022-11-11 (×9): qty 0.5

## 2022-11-11 MED ORDER — ATORVASTATIN CALCIUM 80 MG PO TABS
80.0000 mg | ORAL_TABLET | Freq: Every day | ORAL | Status: DC
  Administered 2022-11-12 – 2022-11-16 (×5): 80 mg via ORAL
  Filled 2022-11-11 (×5): qty 1

## 2022-11-11 MED ORDER — ONDANSETRON HCL 4 MG/2ML IJ SOLN
4.0000 mg | Freq: Once | INTRAMUSCULAR | Status: AC
  Administered 2022-11-11: 4 mg via INTRAVENOUS
  Filled 2022-11-11: qty 2

## 2022-11-11 MED ORDER — SODIUM CHLORIDE 0.9 % IV SOLN: INTRAVENOUS | Status: DC

## 2022-11-11 MED ORDER — SODIUM CHLORIDE 0.9 % IV BOLUS
500.0000 mL | Freq: Once | INTRAVENOUS | Status: AC
  Administered 2022-11-11: 500 mL via INTRAVENOUS

## 2022-11-11 MED ORDER — MORPHINE SULFATE (PF) 2 MG/ML IV SOLN
2.0000 mg | Freq: Once | INTRAVENOUS | Status: AC
  Administered 2022-11-11: 2 mg via INTRAVENOUS
  Filled 2022-11-11: qty 1

## 2022-11-11 MED ORDER — HEPARIN (PORCINE) 25000 UT/250ML-% IV SOLN
1150.0000 [IU]/h | INTRAVENOUS | Status: DC
  Administered 2022-11-11: 1150 [IU]/h via INTRAVENOUS
  Filled 2022-11-11: qty 250

## 2022-11-11 MED ORDER — CHLORHEXIDINE GLUCONATE 4 % EX SOLN
60.0000 mL | Freq: Once | CUTANEOUS | Status: AC
  Administered 2022-11-12: 4 via TOPICAL
  Filled 2022-11-11: qty 60

## 2022-11-11 MED ORDER — HYDRALAZINE HCL 20 MG/ML IJ SOLN
5.0000 mg | Freq: Four times a day (QID) | INTRAMUSCULAR | Status: DC | PRN
  Administered 2022-11-11 – 2022-11-12 (×2): 5 mg via INTRAVENOUS
  Filled 2022-11-11 (×2): qty 1

## 2022-11-11 MED ORDER — HYDRALAZINE HCL 20 MG/ML IJ SOLN
5.0000 mg | Freq: Once | INTRAMUSCULAR | Status: AC
  Administered 2022-11-11: 5 mg via INTRAVENOUS
  Filled 2022-11-11: qty 1

## 2022-11-11 MED ORDER — FLUOXETINE HCL 20 MG PO CAPS
20.0000 mg | ORAL_CAPSULE | Freq: Every day | ORAL | Status: DC
  Administered 2022-11-12 – 2022-11-16 (×5): 20 mg via ORAL
  Filled 2022-11-11 (×5): qty 1

## 2022-11-11 MED ORDER — MORPHINE SULFATE (PF) 4 MG/ML IV SOLN
4.0000 mg | Freq: Once | INTRAVENOUS | Status: AC
  Administered 2022-11-11: 4 mg via INTRAVENOUS
  Filled 2022-11-11: qty 1

## 2022-11-11 MED ORDER — EZETIMIBE 10 MG PO TABS
10.0000 mg | ORAL_TABLET | Freq: Every day | ORAL | Status: DC
  Administered 2022-11-12 – 2022-11-16 (×5): 10 mg via ORAL
  Filled 2022-11-11 (×5): qty 1

## 2022-11-11 MED ORDER — BUPROPION HCL ER (XL) 150 MG PO TB24
150.0000 mg | ORAL_TABLET | Freq: Every day | ORAL | Status: DC
  Administered 2022-11-12 – 2022-11-16 (×5): 150 mg via ORAL
  Filled 2022-11-11 (×5): qty 1

## 2022-11-11 NOTE — ED Notes (Signed)
Cardiologist at bedside.  

## 2022-11-11 NOTE — ED Provider Notes (Signed)
[place] EMERGENCY DEPARTMENT AT [facility] Provider Note  CSN: [id number] Arrival date & time: 11/11/22 1622  Chief Complaint(s) Fall (Eliquis )  HPI Luis [name] is a 88 y.o. male with past medical history as below, significant for A-fib on Eliquis, CAD, DJD, HTN, HLD, TIA, MDD who presents to the ED with complaint of fall, left-sided hip pain.  Reports he was standing on a stepstool to reach an object on a shelf, he stepped off the edge accidentally and fell to the ground onto his left side.  No head injury, no LOC, he is anticoagulant Eliquis for his A-fib.  Pain to his left hip after the fall.  Unable to get up secondary to left hip pain.  Unable to really move his left hip at all secondary to the discomfort.  Also has some discomfort to his left shoulder.  Adamant he did not strike his head, no LOC, no chest pain or dyspnea, no prodromal symptoms.  No syncope.  Past Medical History Past Medical History:  Diagnosis Date   Anal abscess    CAD (coronary artery disease)    a. BMS-RCA, BMS-Cx, PTCA of OM1 in 2001. b. overlapping DES in RCA and PTCA to small distal Cx 2013. c. Cath 2016 -> stable CAD, suspect dyspnea unrelated to coronary disease.   COLONIC POLYPS, HX OF    Dr Kinnie Scales   DJD (degenerative joint disease)    Essential hypertension    Hyperlipidemia    Myocardial infarction (HCC) 08/1999   Persistent atrial fibrillation (HCC)    a. dx 2016.   RBBB    TIA (transient ischemic attack)    Patient Active Problem List   Diagnosis Date Noted   Closed displaced fracture of left femoral neck (HCC) 11/11/2022   Hip fracture (HCC) 11/11/2022   Prostate cancer (HCC) 05/25/2021   Major depression, recurrent, chronic (HCC) 02/19/2019   Mild carotid artery disease (HCC) 02/19/2019   Insomnia 02/18/2018   Dizziness 11/27/2017   Unsteady gait 10/14/2017   TIA (transient ischemic attack) 11/29/2014   Atrial fibrillation (HCC) 11/29/2014   HTN (hypertension)  11/29/2014   HLD (hyperlipidemia) 10/27/2007   Coronary atherosclerosis 10/27/2007   COLONIC POLYPS, HX OF 10/27/2007   Home Medication(s) Prior to Admission medications   Medication Sig Start Date End Date Taking? Authorizing Provider  fluticasone (FLONASE) 50 MCG/ACT nasal spray Place 1 spray into both nostrils in the morning.   Yes [provider]  Glucosamine HCl 1000 MG TABS Take 2,000 mg by mouth daily.   Yes [provider]  nitroGLYCERIN (NITROSTAT) 0.4 MG SL tablet DISSOLVE 1 TABLET UNDER TONGUE AS NEEEDED FOR CHEST PAIN, MAY REPEAT IN 5 MINUTES FOR 2 DOSES Patient taking differently: Place 0.4 mg under the tongue every 5 (five) minutes x 2 doses as needed for chest pain. 05/15/21  Yes Kathleene Hazel, MD  vitamin E 1000 UNIT capsule Take 1,000 Units by mouth daily. 08/31/09  Yes [provider]  apixaban (ELIQUIS) 5 MG TABS tablet TAKE ONE TABLET TWICE DAILY 07/23/22   Kathleene Hazel, MD  atorvastatin (LIPITOR) 80 MG tablet TAKE ONE TABLET EACH DAY 10/30/22   Kathleene Hazel, MD  buPROPion (WELLBUTRIN XL) 150 MG 24 hr tablet Take 150 mg by mouth daily. 05/12/22   [provider]  ezetimibe (ZETIA) 10 MG tablet TAKE ONE TABLET EACH DAY AT BEDTIME Patient taking differently: Take 10 mg by mouth daily. 06/12/22   Kathleene Hazel, MD  FLUoxetine (  PROZAC) 20 MG capsule Take 20 mg by mouth daily. 02/26/22   [provider]  ketoconazole (NIZORAL) 2 % cream Apply 1 application topically 2 (two) times daily. Patient not taking: Reported on 11/11/2022 05/25/21   Ardith Dark, MD  nystatin-triamcinolone [facility] II) cream SMARTSIG:Topical Morning-Evening 09/25/21   [provider]                                                                                                                                    Past Surgical History Past Surgical History:  Procedure Laterality Date   CARDIAC CATHETERIZATION  ~ 2002    CARDIAC CATHETERIZATION N/A 03/02/2015   Procedure: Left Heart Cath and Coronary Angiography;  Surgeon: Tonny Bollman, MD;  Location: [facility] INVASIVE CV LAB;  Service: Cardiovascular;  Laterality: N/A;   CATARACT EXTRACTION W/ INTRAOCULAR LENS  IMPLANT, BILATERAL  ~ 2007   bilaterally   CORONARY ANGIOPLASTY  07/30/11   CORONARY ANGIOPLASTY WITH STENT PLACEMENT  2001; 07/30/11;   "2+3"   INGUINAL HERNIA REPAIR  2005   left   LEFT HEART CATHETERIZATION WITH CORONARY ANGIOGRAM N/A 07/30/2011   Procedure: LEFT HEART CATHETERIZATION WITH CORONARY ANGIOGRAM;  Surgeon: Kathleene Hazel, MD;  Location: [facility] CATH LAB;  Service: Cardiovascular;  Laterality: N/A;   PERCUTANEOUS CORONARY STENT INTERVENTION (PCI-S)  07/30/2011   Procedure: PERCUTANEOUS CORONARY STENT INTERVENTION (PCI-S);  Surgeon: Kathleene Hazel, MD;  Location: [facility] CATH LAB;  Service: Cardiovascular;;   RETINAL DETACHMENT SURGERY  05/2008; 06/2008; 07/2008   left; "all 3 surgeries have failed"   Family History Family History  Problem Relation Age of Onset   Alcohol abuse Mother    Colon cancer Father        in 35s   Alcoholism Sister     Social History Social History   Tobacco Use   Smoking status: Former    Packs/day: 0.50    Years: 40.00    Additional pack years: 0.00    Total pack years: 20.00    Types: Cigarettes    Quit date: 06/12/1999    Years since quitting: 23.4   Smokeless tobacco: Never  Vaping Use   Vaping Use: Never used  Substance Use Topics   Alcohol use: No    Comment: 07/30/11 "recovering alcoholic; 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  Review of Systems Review of Systems  Constitutional:  Negative for chills and fever.  HENT:  Negative for facial swelling and trouble swallowing.   Eyes:  Negative for photophobia and visual disturbance.  Respiratory:  Negative for cough and shortness of breath.   Cardiovascular:  Negative for chest pain and  palpitations.  Gastrointestinal:  Negative for abdominal pain, nausea and vomiting.  Endocrine: Negative for polydipsia and polyuria.  Genitourinary:  Negative for difficulty urinating and hematuria.  Musculoskeletal:  Positive for arthralgias and gait problem. Negative for joint  swelling.  Skin:  Negative for pallor and rash.  Neurological:  Negative for syncope and headaches.  Psychiatric/Behavioral:  Negative for agitation and confusion.     Physical Exam Vital Signs  I have reviewed the triage vital signs BP (!) 155/89   Pulse 70   Temp 97.7 F (36.5 C) (Oral)   Resp 19   Ht 6' (1.829 m)   Wt 83 kg   SpO2 100%   BMI 24.82 kg/m  Physical Exam Vitals and nursing note reviewed.  Constitutional:      General: He is not in acute distress.    Appearance: Normal appearance. He is well-developed.  HENT:     Head: Normocephalic and atraumatic. No raccoon eyes, Battle's sign, right periorbital erythema or left periorbital erythema.     Jaw: There is normal jaw occlusion. No trismus.     Right Ear: External ear normal.     Left Ear: External ear normal.     Mouth/Throat:     Mouth: Mucous membranes are moist.  Eyes:     General: No scleral icterus.    Extraocular Movements: Extraocular movements intact.     Pupils: Pupils are equal, round, and reactive to light.  Neck:     Trachea: Trachea and phonation normal. No tracheal tenderness.     Comments: No midline TTP Cardiovascular:     Rate and Rhythm: Normal rate and regular rhythm.     Pulses: Normal pulses.     Heart sounds: Normal heart sounds.  Pulmonary:     Effort: Pulmonary effort is normal. No respiratory distress.     Breath sounds: Normal breath sounds.  Abdominal:     General: Abdomen is flat.     Palpations: Abdomen is soft.     Tenderness: There is no abdominal tenderness.  Musculoskeletal:        General: Normal range of motion.     Cervical back: Full passive range of motion without pain. No rigidity. No  pain with movement.     Right lower leg: No edema.     Left lower leg: No edema.       Legs:     Comments: DP and PT to pulses are intact symmetric bilateral capillary refill is brisk to feet  Severely limited range of motion to left lower extremity secondary to discomfort  No crepitance at proximal femur on exam  Skin:    General: Skin is warm and dry.     Capillary Refill: Capillary refill takes less than 2 seconds.  Neurological:     Mental Status: He is alert and oriented to person, place, and time.     GCS: GCS eye subscore is 4. GCS verbal subscore is 5. GCS motor subscore is 6.     Cranial Nerves: Cranial nerves 2-12 are intact. No dysarthria.     Sensory: Sensation is intact.     Motor: Motor function is intact.     Coordination: Coordination is intact.  Psychiatric:        Mood and Affect: Mood normal.        Behavior: Behavior normal.     ED Results and Treatments Labs (all labs ordered are listed, but only abnormal results are displayed) Labs Reviewed  BASIC METABOLIC PANEL - Abnormal; Notable for the following components:      Result Value   Glucose, Bld 144 (*)    Creatinine, Ser 1.25 (*)    Calcium 8.8 (*)    GFR, Estimated 56 (*)  All other components within normal limits  CBC  CBC                                                                                                                          Radiology DG Hip Unilat W or Wo Pelvis 2-3 Views Left  Result Date: 11/11/2022 CLINICAL DATA:  Trauma, fall EXAM: DG HIP (WITH OR WITHOUT PELVIS) 2-3V LEFT COMPARISON:  None Available. FINDINGS: Acute comminuted intertrochanteric fracture is seen in the neck of left femur extending into the proximal shaft. There is a 19 mm offset in alignment of lateral cortical margins in the fracture. There is no dislocation. There is mild narrowing of lateral aspect of left hip joint space. Arterial calcifications are seen in soft tissues. IMPRESSION: Comminuted displaced  recent fracture is seen in the intertrochanteric portion of left femur extending into the proximal shaft. Electronically Signed   By: Ernie Avena M.D.   On: 11/11/2022 17:52   DG Shoulder Left  Result Date: 11/11/2022 CLINICAL DATA:  Trauma, fall EXAM: LEFT SHOULDER - 2+ VIEW COMPARISON:  None FINDINGS: No fracture or dislocation is seen. Small bony spurs seen in [facility] joint. Calcifications are seen in thoracic aorta. IMPRESSION: No recent fracture or dislocation is seen in left shoulder. Degenerative changes are noted in left AC joint. Electronically Signed   By: Ernie Avena M.D.   On: 11/11/2022 17:50   DG Chest Portable 1 View  Result Date: 11/11/2022 CLINICAL DATA:  Fall EXAM: PORTABLE CHEST 1 VIEW COMPARISON:  Chest x-ray 03/04/2017 FINDINGS: The heart size and mediastinal contours are within normal limits. There are atherosclerotic calcifications of the aorta. Both lungs are clear. The visualized skeletal structures are unremarkable. IMPRESSION: No active disease. Electronically Signed   By: Darliss Cheney M.D.   On: 11/11/2022 17:49    Pertinent labs & imaging results that were available during my care of the patient were reviewed by me and considered in my medical decision making (see MDM for details).  Medications Ordered in ED Medications  0.9 %  sodium chloride infusion ( Intravenous New Bag/Given 11/11/22 1816)  atorvastatin (LIPITOR) tablet 80 mg (has no administration in time range)  ezetimibe (ZETIA) tablet 10 mg (has no administration in time range)  buPROPion (WELLBUTRIN XL) 24 hr tablet 150 mg (has no administration in time range)  FLUoxetine (PROZAC) capsule 20 mg (has no administration in time range)  HYDROmorphone (DILAUDID) injection 0.5 mg (has no administration in time range)  hydrALAZINE (APRESOLINE) injection 5 mg (has no administration in time range)  heparin ADULT infusion 100 units/mL (25000 units/277mL) (has no administration in time range)  morphine (PF) 2  MG/ML injection 2 mg (2 mg Intravenous Given 11/11/22 1718)  ondansetron (ZOFRAN) injection 4 mg (4 mg Intravenous Given 11/11/22 1718)  sodium chloride 0.9 % bolus 500 mL (0 mLs Intravenous Stopped 11/11/22 1756)  morphine (PF) 4 MG/ML injection 4 mg (4 mg Intravenous Given 11/11/22 1817)  hydrALAZINE (APRESOLINE) injection  5 mg (5 mg Intravenous Given 11/11/22 1853)                                                                                                                                     Procedures Procedures  (including critical care time)  Medical Decision Making / ED Course    Medical Decision Making:    Luis [name] is a 88 y.o. male A-fib on Eliquis here with apparent mechanical fall off a stepstool with left hip and left shoulder pain.. The complaint involves an extensive differential diagnosis and also carries with it a high risk of complications and morbidity.  Serious etiology was considered. Ddx includes but is not limited to: Sprain, strain, soft tissue injury, fracture, dislocation, etc.  Complete initial physical exam performed, notably the patient  was pain to left hip with range of motion.  No external signs of head trauma on exam.    Reviewed and confirmed nursing documentation for past medical history, family history, social history.  Vital signs reviewed.    Clinical Course as of 11/11/22 1939  Wynelle Link Nov 11, 2022  1752 Femur fx on wet read of XR, LE with equal PT and DP pulses, cap refill brisk both feet. Closed  [SG]  1823 Intertroch femur fx on XR [SG]    Clinical Course User Index [SG] Sloan Leiter, DO    Labs reviewed and are stable, similar to his baseline Trochanteric fracture on x-ray D/w Dr. Charlann Boxer, tentative plan for OR tomorrow w/ Dr Carola Frost  Recommend n.p.o. at midnight, hold Eliquis Given hydralazine, BP elevated today, favor 2/2 discomfort, no cp or dib, no syncope  Will admit to [facility] for ongoing care          Additional history  obtained: -Additional history obtained from spouse -External records from outside source obtained and reviewed including: Chart review including previous notes, labs, imaging, consultation notes including primary care recommendation, home medications, prior labs and imaging   Lab Tests: -I ordered, reviewed, and interpreted labs.   The pertinent results include:   Labs Reviewed  BASIC METABOLIC PANEL - Abnormal; Notable for the following components:      Result Value   Glucose, Bld 144 (*)    Creatinine, Ser 1.25 (*)    Calcium 8.8 (*)    GFR, Estimated 56 (*)    All other components within normal limits  CBC  CBC    Notable for stable labs  EKG   EKG Interpretation  Date/Time:  Sunday November 11 2022 19:15:31 EDT Ventricular Rate:  79 PR Interval:    QRS Duration: 137 QT Interval:  429 QTC Calculation: 492 R Axis:   -65 Text Interpretation: Atrial fibrillation Right bundle branch block hx similar no stemi Confirmed by Tanda Rockers ([id number]) on 11/11/2022 7:39:05 PM         Imaging Studies ordered: I ordered imaging studies including xr left hip, cxr,  shoulder xr I independently visualized the following imaging with scope of interpretation limited to determining acute life threatening conditions related to emergency care; findings noted above, significant for intertroch fx I independently visualized and interpreted imaging. I agree with the radiologist interpretation   Medicines ordered and prescription drug management: Meds ordered this encounter  Medications   morphine (PF) 2 MG/ML injection 2 mg   ondansetron (ZOFRAN) injection 4 mg   sodium chloride 0.9 % bolus 500 mL   morphine (PF) 4 MG/ML injection 4 mg   0.9 %  sodium chloride infusion   hydrALAZINE (APRESOLINE) injection 5 mg   atorvastatin (LIPITOR) tablet 80 mg    TAKE ONE TABLET EACH DAY     ezetimibe (ZETIA) tablet 10 mg   buPROPion (WELLBUTRIN XL) 24 hr tablet 150 mg   FLUoxetine (PROZAC) capsule 20 mg    HYDROmorphone (DILAUDID) injection 0.5 mg   hydrALAZINE (APRESOLINE) injection 5 mg   heparin ADULT infusion 100 units/mL (25000 units/26mL)    -I have reviewed the patients home medicines and have made adjustments as needed   Consultations Obtained: I requested consultation with the Dr Charlann Boxer,  and discussed lab and imaging findings as well as pertinent plan - they recommend: admit OR tomorrow ?dr handy   Cardiac Monitoring: The patient was maintained on a cardiac monitor.  I personally viewed and interpreted the cardiac monitored which showed an underlying rhythm of: afib  Social Determinants of Health:  Diagnosis or treatment significantly limited by social determinants of health: former smoker   Reevaluation: After the interventions noted above, I reevaluated the patient and found that they have improved  Co morbidities that complicate the patient evaluation  Past Medical History:  Diagnosis Date   Anal abscess    CAD (coronary artery disease)    a. BMS-RCA, BMS-Cx, PTCA of OM1 in 2001. b. overlapping DES in RCA and PTCA to small distal Cx 2013. c. Cath 2016 -> stable CAD, suspect dyspnea unrelated to coronary disease.   COLONIC POLYPS, HX OF    Dr Kinnie Scales   DJD (degenerative joint disease)    Essential hypertension    Hyperlipidemia    Myocardial infarction (HCC) 08/1999   Persistent atrial fibrillation (HCC)    a. dx 2016.   RBBB    TIA (transient ischemic attack)       Dispostion: Disposition decision including need for hospitalization was considered, and patient admitted to the hospital.    Final Clinical Impression(s) / ED Diagnoses Final diagnoses:  Fall, initial encounter  Closed fracture of left femur, unspecified fracture morphology, unspecified portion of femur, initial encounter (HCC)  Anticoagulated     This chart was dictated using voice recognition software.  Despite best efforts to proofread,  errors can occur which can change the  documentation meaning.    Sloan Leiter, DO 11/11/22 1939

## 2022-11-11 NOTE — Progress Notes (Signed)
ANTICOAGULATION CONSULT NOTE - Initial Consult  Pharmacy Consult for heparin Indication: atrial fibrillation  No Known Allergies  Patient Measurements: Height: 6' (182.9 cm) Weight: 83 kg (182 lb 15.7 oz) IBW/kg (Calculated) : 77.6 Heparin Dosing Weight: 83 kg  Vital Signs: Temp: 97.7 F (36.5 C) (06/02 1632) Temp Source: Oral (06/02 1632) BP: 155/89 (06/02 1830) Pulse Rate: 70 (06/02 1830)  Labs: Recent Labs    11/11/22 1641  HGB 14.5  HCT 42.8  PLT 188  CREATININE 1.25*    Estimated Creatinine Clearance: 47.4 mL/min (A) (by C-G formula based on SCr of 1.25 mg/dL (H)).   Medical History: Past Medical History:  Diagnosis Date   Anal abscess    CAD (coronary artery disease)    a. BMS-RCA, BMS-Cx, PTCA of OM1 in 2001. b. overlapping DES in RCA and PTCA to small distal Cx 2013. c. Cath 2016 -> stable CAD, suspect dyspnea unrelated to coronary disease.   COLONIC POLYPS, HX OF    Dr Kinnie Scales   DJD (degenerative joint disease)    Essential hypertension    Hyperlipidemia    Myocardial infarction (HCC) 08/1999   Persistent atrial fibrillation (HCC)    a. dx 2016.   RBBB    TIA (transient ischemic attack)     Medications:  (Not in a hospital admission)  Scheduled:   atorvastatin  80 mg Oral Daily   buPROPion  150 mg Oral Daily   ezetimibe  10 mg Oral Daily   FLUoxetine  20 mg Oral Daily   Assessment: 29 yoM with PMH of afib (eliquis), CAD, HTN, HLD, TIA, and MDD presents after fall and left sided hip pain. Pharmacy consulted to dose heparin. He will be going to OR tomorrow for IM nail. Last dose of elqiuis 6/2 AM, Hgb 14.5, plts 188. No signs/symptoms of bleeding documented at this time   Goal of Therapy:  Heparin level 0.3-0.7 units/ml aPTT 66-102 seconds Monitor platelets by anticoagulation protocol: Yes   Plan:  Will not bolus since took eliquis this AM Start heparin infusion at 1150 units/hr Check baseline aPTT in 8 hours Check aPTT and anti-Xa levels  daily while on heparin until levels correlate Continue to monitor H&H and platelets Follow [facility] plan after OR  Arabella Merles, PharmD. [facility] Acute Care PGY-1 11/11/2022 9:35 PM

## 2022-11-11 NOTE — ED Notes (Signed)
X-ray at bedside

## 2022-11-11 NOTE — ED Notes (Signed)
EM provider at bedside.

## 2022-11-11 NOTE — Progress Notes (Signed)
Orthopedic Tech Progress Note Patient Details:  [name] [birth year] [id number]  Patient ID: Luis [name], male   DOB: [birth year], 88 y.o.   MRN: [id number] Pt doesn't meet criteria for ohf. Pt must be under 70 to get ohf. Trinna Post 11/11/2022, 9:47 PM

## 2022-11-11 NOTE — H&P (Addendum)
History and Physical    Luis [name] ZOX:[id number] DOB: 07/01/1934 DOA: 11/11/2022  PCP: Ardith Dark, MD (Confirm with patient/family/NH records and if not entered, this has to be entered at [facility] point of entry) Patient coming from: Home  I have personally briefly reviewed patient's old medical records in [facility] Health Link  Chief Complaint: I fell and broke my hip  HPI: Luis [name] is a 88 y.o. male with medical history significant of CAD/MI status post stenting RCA LCx, with baseline stable angina, persistent A-fib on Eliquis, multiple-choice, presented with mechanical fall and left hip fracture.  Patient was doing some household activity in the wall, while stepping on the stool he lost his steps and fell on his left hip.  Denies any chest pain shortness of breath blurry vision palpitations.  He has a chronic A-fib, rate controlled by self not on any rate control medications.  Denies any recent chest pain shortness of breath.  At baseline he is active, living in a two-story house, able to climb 1 flight of stairs at home multiple times a day without chest pain or shortness of breath.  Mostly present cath in 2016 and no recent year stress test  ED Course: Afebrile, blood pressure elevated no tachycardia no hypoxia.  X-ray showed left femoral intertrochanter fracture extending into the femoral shaft.  Creatinine 1.2 WBC 7.1 hemoglobin 14.5.  Review of Systems: As per HPI otherwise 14 point review of systems negative.   Past Medical History:  Diagnosis Date   Anal abscess    CAD (coronary artery disease)    a. BMS-RCA, BMS-Cx, PTCA of OM1 in 2001. b. overlapping DES in RCA and PTCA to small distal Cx 2013. c. Cath 2016 -> stable CAD, suspect dyspnea unrelated to coronary disease.   COLONIC POLYPS, HX OF    Dr Kinnie Scales   DJD (degenerative joint disease)    Essential hypertension    Hyperlipidemia    Myocardial infarction (HCC) 08/1999   Persistent atrial fibrillation (HCC)    a.  dx 2016.   RBBB    TIA (transient ischemic attack)     Past Surgical History:  Procedure Laterality Date   CARDIAC CATHETERIZATION  ~ 2002   CARDIAC CATHETERIZATION N/A 03/02/2015   Procedure: Left Heart Cath and Coronary Angiography;  Surgeon: Tonny Bollman, MD;  Location: [facility] INVASIVE CV LAB;  Service: Cardiovascular;  Laterality: N/A;   CATARACT EXTRACTION W/ INTRAOCULAR LENS  IMPLANT, BILATERAL  ~ 2007   bilaterally   CORONARY ANGIOPLASTY  07/30/11   CORONARY ANGIOPLASTY WITH STENT PLACEMENT  2001; 07/30/11;   "2+3"   INGUINAL HERNIA REPAIR  2005   left   LEFT HEART CATHETERIZATION WITH CORONARY ANGIOGRAM N/A 07/30/2011   Procedure: LEFT HEART CATHETERIZATION WITH CORONARY ANGIOGRAM;  Surgeon: Kathleene Hazel, MD;  Location: [facility] CATH LAB;  Service: Cardiovascular;  Laterality: N/A;   PERCUTANEOUS CORONARY STENT INTERVENTION (PCI-S)  07/30/2011   Procedure: PERCUTANEOUS CORONARY STENT INTERVENTION (PCI-S);  Surgeon: Kathleene Hazel, MD;  Location: [facility] CATH LAB;  Service: Cardiovascular;;   RETINAL DETACHMENT SURGERY  05/2008; 06/2008; 07/2008   left; "all 3 surgeries have failed"     reports that he quit smoking about 23 years ago. His smoking use included cigarettes. He has a 20.00 pack-year smoking history. He has never used smokeless tobacco. He reports that he does not drink alcohol and does not use drugs.  No Known Allergies  Family History  Problem Relation Age of Onset   Alcohol  abuse Mother    Colon cancer Father        in 80s   Alcoholism Sister      Prior to Admission medications   Medication Sig Start Date End Date Taking? Authorizing Provider  apixaban (ELIQUIS) 5 MG TABS tablet TAKE ONE TABLET TWICE DAILY 07/23/22   Kathleene Hazel, MD  atorvastatin (LIPITOR) 80 MG tablet TAKE ONE TABLET EACH DAY 10/30/22   Kathleene Hazel, MD  buPROPion (WELLBUTRIN XL) 150 MG 24 hr tablet Take 150 mg by mouth daily. 05/12/22   [provider]   ezetimibe (ZETIA) 10 MG tablet TAKE ONE TABLET EACH DAY AT BEDTIME 06/12/22   Kathleene Hazel, MD  FLUoxetine (PROZAC) 20 MG capsule Take 20 mg by mouth daily. 02/26/22   [provider]  Glucosamine HCl 1000 MG TABS Take 1 tablet by mouth daily.     [provider]  ketoconazole (NIZORAL) 2 % cream Apply 1 application topically 2 (two) times daily. 05/25/21   Ardith Dark, MD  nitroGLYCERIN (NITROSTAT) 0.4 MG SL tablet DISSOLVE 1 TABLET UNDER TONGUE AS NEEEDED FOR CHEST PAIN, MAY REPEAT IN 5 MINUTES FOR 2 DOSES 05/15/21   Kathleene Hazel, MD  nystatin-triamcinolone [facility]) cream SMARTSIG:Topical Morning-Evening 09/25/21   [provider]  Potassium Aminobenzoate 500 MG TABS Take 1 tablet by mouth daily.    [provider]  vitamin E 1000 UNIT capsule Take 2,000 Units by mouth daily. 08/31/09   [provider]    Physical Exam: Vitals:   11/11/22 1700 11/11/22 1730 11/11/22 1800 11/11/22 1830  BP: (!) 197/78 (!) 189/92 (!) 195/111 (!) 155/89  Pulse: (!) 34 65 63 70  Resp: (!) 22 16 18 19   Temp:      TempSrc:      SpO2: 100% 100% 100% 100%  Weight:      Height:        Constitutional: NAD, calm, comfortable Vitals:   11/11/22 1700 11/11/22 1730 11/11/22 1800 11/11/22 1830  BP: (!) 197/78 (!) 189/92 (!) 195/111 (!) 155/89  Pulse: (!) 34 65 63 70  Resp: (!) 22 16 18 19   Temp:      TempSrc:      SpO2: 100% 100% 100% 100%  Weight:      Height:       Eyes: PERRL, lids and conjunctivae normal ENMT: Mucous membranes are moist. Posterior pharynx clear of any exudate or lesions.Normal dentition.  Neck: normal, supple, no masses, no thyromegaly Respiratory: clear to auscultation bilaterally, no wheezing, no crackles. Normal respiratory effort. No accessory muscle use.  Cardiovascular: Regular rate and rhythm, no murmurs / rubs / gallops. No extremity edema. 2+ pedal pulses. No carotid bruits.  Abdomen: no tenderness, no  masses palpated. No hepatosplenomegaly. Bowel sounds positive.  Musculoskeletal: Left leg laterally rotated and shortened Skin: no rashes, lesions, ulcers. No induration Neurologic: CN 2-12 grossly intact. Sensation intact, DTR normal. Strength 5/5 in all 4.  Psychiatric: Normal judgment and insight. Alert and oriented x 3. Normal mood.     Labs on Admission: I have personally reviewed following labs and imaging studies  CBC: Recent Labs  Lab 11/11/22 1641  WBC 7.1  HGB 14.5  HCT 42.8  MCV 93.7  PLT 188   Basic Metabolic Panel: Recent Labs  Lab 11/11/22 1641  NA 136  K 4.2  CL 105  CO2 22  GLUCOSE 144*  BUN 13  CREATININE 1.25*  CALCIUM 8.8*   GFR:  Estimated Creatinine Clearance: 47.4 mL/min (A) (by C-G formula based on SCr of 1.25 mg/dL (H)). Liver Function Tests: No results for input(s): "AST", "ALT", "ALKPHOS", "BILITOT", "PROT", "ALBUMIN" in the last 168 hours. No results for input(s): "LIPASE", "AMYLASE" in the last 168 hours. No results for input(s): "AMMONIA" in the last 168 hours. Coagulation Profile: No results for input(s): "INR", "PROTIME" in the last 168 hours. Cardiac Enzymes: No results for input(s): "CKTOTAL", "CKMB", "CKMBINDEX", "TROPONINI" in the last 168 hours. BNP (last 3 results) No results for input(s): "PROBNP" in the last 8760 hours. HbA1C: No results for input(s): "HGBA1C" in the last 72 hours. CBG: No results for input(s): "GLUCAP" in the last 168 hours. Lipid Profile: No results for input(s): "CHOL", "HDL", "LDLCALC", "TRIG", "CHOLHDL", "LDLDIRECT" in the last 72 hours. Thyroid Function Tests: No results for input(s): "TSH", "T4TOTAL", "FREET4", "T3FREE", "THYROIDAB" in the last 72 hours. Anemia Panel: No results for input(s): "VITAMINB12", "FOLATE", "FERRITIN", "TIBC", "IRON", "RETICCTPCT" in the last 72 hours. Urine analysis:    Component Value Date/Time   COLORURINE YELLOW 03/04/2017 1445   APPEARANCEUR CLEAR 03/04/2017 1445    LABSPEC <1.005 (L) 03/04/2017 1445   PHURINE 7.0 03/04/2017 1445   GLUCOSEU NEGATIVE 03/04/2017 1445   GLUCOSEU NEGATIVE 06/26/2011 0813   HGBUR NEGATIVE 03/04/2017 1445   BILIRUBINUR NEGATIVE 03/04/2017 1445   KETONESUR NEGATIVE 03/04/2017 1445   PROTEINUR NEGATIVE 03/04/2017 1445   UROBILINOGEN 0.2 11/29/2014 1735   NITRITE NEGATIVE 03/04/2017 1445   LEUKOCYTESUR NEGATIVE 03/04/2017 1445    Radiological Exams on Admission: DG Hip Unilat W or Wo Pelvis 2-3 Views Left  Result Date: 11/11/2022 CLINICAL DATA:  Trauma, fall EXAM: DG HIP (WITH OR WITHOUT PELVIS) 2-3V LEFT COMPARISON:  None Available. FINDINGS: Acute comminuted intertrochanteric fracture is seen in the neck of left femur extending into the proximal shaft. There is a 19 mm offset in alignment of lateral cortical margins in the fracture. There is no dislocation. There is mild narrowing of lateral aspect of left hip joint space. Arterial calcifications are seen in soft tissues. IMPRESSION: Comminuted displaced recent fracture is seen in the intertrochanteric portion of left femur extending into the proximal shaft. Electronically Signed   By: Ernie Avena M.D.   On: 11/11/2022 17:52   DG Shoulder Left  Result Date: 11/11/2022 CLINICAL DATA:  Trauma, fall EXAM: LEFT SHOULDER - 2+ VIEW COMPARISON:  None FINDINGS: No fracture or dislocation is seen. Small bony spurs seen in [facility] joint. Calcifications are seen in thoracic aorta. IMPRESSION: No recent fracture or dislocation is seen in left shoulder. Degenerative changes are noted in left AC joint. Electronically Signed   By: Ernie Avena M.D.   On: 11/11/2022 17:50   DG Chest Portable 1 View  Result Date: 11/11/2022 CLINICAL DATA:  Fall EXAM: PORTABLE CHEST 1 VIEW COMPARISON:  Chest x-ray 03/04/2017 FINDINGS: The heart size and mediastinal contours are within normal limits. There are atherosclerotic calcifications of the aorta. Both lungs are clear. The visualized skeletal  structures are unremarkable. IMPRESSION: No active disease. Electronically Signed   By: Darliss Cheney M.D.   On: 11/11/2022 17:49    EKG: Independently reviewed.  Chronic A-fib with chronic T inversions changes on V1 through V3  Assessment/Plan Principal Problem:   Hip fracture (HCC) Active Problems:   Atrial fibrillation (HCC)   Coronary atherosclerosis   Closed displaced fracture of left femoral neck (HCC)  (please populate well all problems here in Problem List. (For example, if patient is on BP meds  at home and you resume or decide to hold them, it is a problem that needs to be her. Same for CAD, COPD, HLD and so on)  Left femoral intertrochanter fracture -Requires ORIF.  Orthopedic surgery on board -Significant dislocated proximal fracture portion/femoral head and neck toward the lateral side, as of now there is no clinical evidence of any neural logical deficit or big blood vessel injury, patient denies any numbness weakness of Distal Part of Left Leg and left side pedal pulses 2+ and equal to the right and distal capillary refill on left leg brisk. -As needed Dilaudid -Consult anesthesiologist for neuro block -Incentive spirometry -Patient has good exercise tolerance, able to tolerate 4 METs activity at baseline, with no chest pain or shortness of breath, medically cleared for tomorrow's ORIF with generalized anesthesia with acceptable risk.  Calculated perioperative major cardiac risks 10% in 30 days.  Cardiogram ordered to assist focal wall motion abnormalities.  Persistent A-fib -Rate controlled, borderline bradycardia, not on routine beta-blocker or other rate control medication at home will hold off preoperative beta-blocker in this case. -Change Eliquis to heparin drip  HTN uncontrolled -Likely secondary to the hip fracture pain -Continue as needed hydralazine  HLD -Continue Zetia and Lipitor  DVT prophylaxis: Heparin drip Code Status: Full code Family Communication:  Wife at bedside Disposition Plan: Patient sick with hip fracture requiring ORIF, expect more than 2 midnight hospital stay Consults called: Orthopedic surgery Admission status: Telemetry admission   Emeline General MD [facility] Pager [phone]  11/11/2022, 7:12 PM

## 2022-11-11 NOTE — ED Triage Notes (Signed)
PT BIB EMS from home after falling on eliquis from 18 inch stool while in closet. Pt landed on left side.Left leg is shorted and external rotation. Pt reports left shoulder pain without deformity.  EMS gave 100 fent 20 L AC  EMS BP 200/90

## 2022-11-11 NOTE — Progress Notes (Signed)
Dr. Charlann Boxer has reviewed imaging of the patient's left intertrochanteric femur fracture, and discussed the case with Dr. Carola Frost with [facility].  To OR tomorrow with Dr. Carola Frost for IM nail. NPO after midnight Hold anticoagulation NWB for now   Formal consult note to follow.   Rosalene Billings, PA-C Orthopedic Surgery [facility] [phone]

## 2022-11-11 NOTE — ED Notes (Signed)
ED TO INPATIENT HANDOFF REPORT  ED Nurse Name and Phone #: Juliette Alcide RN [phone]  S Name/Age/Gender Luis [name] 88 y.o. male Room/Bed: 008C/008C  Code Status   Code Status: Full Code  Home/SNF/Other Home Patient oriented to: self, place, time, and situation Is this baseline? Yes   Triage Complete: Triage complete  Chief Complaint Hip fracture (HCC) [S72.009A]  Triage Note PT BIB EMS from home after falling on eliquis from 18 inch stool while in closet. Pt landed on left side.Left leg is shorted and external rotation. Pt reports left shoulder pain without deformity.  EMS gave 100 fent 20 L AC  EMS BP 200/90   Allergies No Known Allergies  Level of Care/Admitting Diagnosis ED Disposition     ED Disposition  Admit   Condition  --   Comment  Hospital Area: [facility] [[id number]]  Level of Care: Telemetry Medical [104]  May admit patient to [facility] or [facility] if equivalent level of care is available:: No  Covid Evaluation: Asymptomatic - no recent exposure (last 10 days) testing not required  Diagnosis: Hip fracture [facility]) [[id number]]  Admitting Physician: Emeline General [[id number]]  Attending Physician: Emeline General [[id number]]  Certification:: I certify this patient will need inpatient services for at least 2 midnights  Estimated Length of Stay: 2          B Medical/Surgery History Past Medical History:  Diagnosis Date   Anal abscess    CAD (coronary artery disease)    a. BMS-RCA, BMS-Cx, PTCA of OM1 in 2001. b. overlapping DES in RCA and PTCA to small distal Cx 2013. c. Cath 2016 -> stable CAD, suspect dyspnea unrelated to coronary disease.   COLONIC POLYPS, HX OF    Dr Kinnie Scales   DJD (degenerative joint disease)    Essential hypertension    Hyperlipidemia    Myocardial infarction (HCC) 08/1999   Persistent atrial fibrillation (HCC)    a. dx 2016.   RBBB    TIA (transient ischemic attack)    Past Surgical History:  Procedure  Laterality Date   CARDIAC CATHETERIZATION  ~ 2002   CARDIAC CATHETERIZATION N/A 03/02/2015   Procedure: Left Heart Cath and Coronary Angiography;  Surgeon: Tonny Bollman, MD;  Location: [facility] INVASIVE CV LAB;  Service: Cardiovascular;  Laterality: N/A;   CATARACT EXTRACTION W/ INTRAOCULAR LENS  IMPLANT, BILATERAL  ~ 2007   bilaterally   CORONARY ANGIOPLASTY  07/30/11   CORONARY ANGIOPLASTY WITH STENT PLACEMENT  2001; 07/30/11;   "2+3"   INGUINAL HERNIA REPAIR  2005   left   LEFT HEART CATHETERIZATION WITH CORONARY ANGIOGRAM N/A 07/30/2011   Procedure: LEFT HEART CATHETERIZATION WITH CORONARY ANGIOGRAM;  Surgeon: Kathleene Hazel, MD;  Location: [facility] CATH LAB;  Service: Cardiovascular;  Laterality: N/A;   PERCUTANEOUS CORONARY STENT INTERVENTION (PCI-S)  07/30/2011   Procedure: PERCUTANEOUS CORONARY STENT INTERVENTION (PCI-S);  Surgeon: Kathleene Hazel, MD;  Location: [facility] CATH LAB;  Service: Cardiovascular;;   RETINAL DETACHMENT SURGERY  05/2008; 06/2008; 07/2008   left; "all 3 surgeries have failed"     A IV Location/Drains/Wounds Patient Lines/Drains/Airways Status     Active Line/Drains/Airways     Name Placement date Placement time Site Days   Peripheral IV 11/11/22 20 G Left Antecubital 11/11/22  1558  Antecubital  less than 1            Intake/Output Last 24 hours  Intake/Output Summary (Last 24 hours) at 11/11/2022 1935 Last data filed at  11/11/2022 1756 Gross per 24 hour  Intake 500 ml  Output --  Net 500 ml    Labs/Imaging Results for orders placed or performed during the hospital encounter of 11/11/22 (from the past 48 hour(s))  CBC     Status: None   Collection Time: 11/11/22  4:41 PM  Result Value Ref Range   WBC 7.1 4.0 - 10.5 K/uL   RBC 4.57 4.22 - 5.81 MIL/uL   Hemoglobin 14.5 13.0 - 17.0 g/dL   HCT 54.0 98.1 - 19.1 %   MCV 93.7 80.0 - 100.0 fL   MCH 31.7 26.0 - 34.0 pg   MCHC 33.9 30.0 - 36.0 g/dL   RDW 47.8 29.5 - 62.1 %   Platelets 188 150 - 400  K/uL   nRBC 0.0 0.0 - 0.2 %    Comment: Performed at [facility], [address], [place], [place] [address]  Basic metabolic panel     Status: Abnormal   Collection Time: 11/11/22  4:41 PM  Result Value Ref Range   Sodium 136 135 - 145 mmol/L   Potassium 4.2 3.5 - 5.1 mmol/L   Chloride 105 98 - 111 mmol/L   CO2 22 22 - 32 mmol/L   Glucose, Bld 144 (H) 70 - 99 mg/dL    Comment: Glucose reference range applies only to samples taken after fasting for at least 8 hours.   BUN 13 8 - 23 mg/dL   Creatinine, Ser 7.84 (H) 0.61 - 1.24 mg/dL   Calcium 8.8 (L) 8.9 - 10.3 mg/dL   GFR, Estimated 56 (L) >60 mL/min    Comment: (NOTE) Calculated using the CKD-EPI Creatinine Equation (2021)    Anion gap 9 5 - 15    Comment: Performed at [facility], [address], [place], [place] [address]   DG Hip Lucienne Capers or Wo Pelvis 2-3 Views Left  Result Date: 11/11/2022 CLINICAL DATA:  Trauma, fall EXAM: DG HIP (WITH OR WITHOUT PELVIS) 2-3V LEFT COMPARISON:  None Available. FINDINGS: Acute comminuted intertrochanteric fracture is seen in the neck of left femur extending into the proximal shaft. There is a 19 mm offset in alignment of lateral cortical margins in the fracture. There is no dislocation. There is mild narrowing of lateral aspect of left hip joint space. Arterial calcifications are seen in soft tissues. IMPRESSION: Comminuted displaced recent fracture is seen in the intertrochanteric portion of left femur extending into the proximal shaft. Electronically Signed   By: Ernie Avena M.D.   On: 11/11/2022 17:52   DG Shoulder Left  Result Date: 11/11/2022 CLINICAL DATA:  Trauma, fall EXAM: LEFT SHOULDER - 2+ VIEW COMPARISON:  None FINDINGS: No fracture or dislocation is seen. Small bony spurs seen in [facility] joint. Calcifications are seen in thoracic aorta. IMPRESSION: No recent fracture or dislocation is seen in left shoulder. Degenerative changes are noted in left AC joint. Electronically  Signed   By: Ernie Avena M.D.   On: 11/11/2022 17:50   DG Chest Portable 1 View  Result Date: 11/11/2022 CLINICAL DATA:  Fall EXAM: PORTABLE CHEST 1 VIEW COMPARISON:  Chest x-ray 03/04/2017 FINDINGS: The heart size and mediastinal contours are within normal limits. There are atherosclerotic calcifications of the aorta. Both lungs are clear. The visualized skeletal structures are unremarkable. IMPRESSION: No active disease. Electronically Signed   By: Darliss Cheney M.D.   On: 11/11/2022 17:49    Pending Labs [employer] (From admission, onward)     Start  Ordered   11/12/22 0500  CBC  Tomorrow morning,   R        11/11/22 1929   Signed and Held  Type and screen Order type and screen if day of surgery is less than 15 days from draw of preadmission visit or order morning of surgery if day of surgery is greater than 6 days from preadmission visit.  Once,   R       Comments: Order type and screen if day of surgery is less than 15 days from draw of preadmission visit or order morning of surgery if day of surgery is greater than 6 days from preadmission visit.    Signed and Held            Vitals/Pain Today's Vitals   11/11/22 1756 11/11/22 1800 11/11/22 1830 11/11/22 1854  BP:  (!) 195/111 (!) 155/89   Pulse:  63 70   Resp:  18 19   Temp:      TempSrc:      SpO2:  100% 100%   Weight:      Height:      PainSc: 8    8     Isolation Precautions No active isolations  Medications Medications  0.9 %  sodium chloride infusion ( Intravenous New Bag/Given 11/11/22 1816)  atorvastatin (LIPITOR) tablet 80 mg (has no administration in time range)  ezetimibe (ZETIA) tablet 10 mg (has no administration in time range)  buPROPion (WELLBUTRIN XL) 24 hr tablet 150 mg (has no administration in time range)  FLUoxetine (PROZAC) capsule 20 mg (has no administration in time range)  HYDROmorphone (DILAUDID) injection 0.5 mg (has no administration in time range)  hydrALAZINE (APRESOLINE)  injection 5 mg (has no administration in time range)  morphine (PF) 2 MG/ML injection 2 mg (2 mg Intravenous Given 11/11/22 1718)  ondansetron (ZOFRAN) injection 4 mg (4 mg Intravenous Given 11/11/22 1718)  sodium chloride 0.9 % bolus 500 mL (0 mLs Intravenous Stopped 11/11/22 1756)  morphine (PF) 4 MG/ML injection 4 mg (4 mg Intravenous Given 11/11/22 1817)  hydrALAZINE (APRESOLINE) injection 5 mg (5 mg Intravenous Given 11/11/22 1853)    Mobility Pt unable to stand at this time.     Focused Assessments See documented musculoskeletal assessment.    R Recommendations: See Admitting Provider Note  Report given to:   Additional Notes: Pt A&Ox4. Pt normally ambulatory without assistance but currently non-ambulatory due to injuries.

## 2022-11-11 NOTE — ED Notes (Signed)
Wife at bedside and updated on plan of care.

## 2022-11-12 ENCOUNTER — Inpatient Hospital Stay (HOSPITAL_COMMUNITY): Payer: Medicare HMO | Admitting: Anesthesiology

## 2022-11-12 ENCOUNTER — Encounter (HOSPITAL_COMMUNITY): Payer: Self-pay | Admitting: Internal Medicine

## 2022-11-12 ENCOUNTER — Inpatient Hospital Stay (HOSPITAL_COMMUNITY): Payer: Medicare HMO

## 2022-11-12 ENCOUNTER — Encounter (HOSPITAL_COMMUNITY)
Admission: EM | Disposition: A | Discharge status: Skilled Nursing Facility | Payer: Self-pay | Source: Home / Self Care | Attending: Student

## 2022-11-12 DIAGNOSIS — I2510 Atherosclerotic heart disease of native coronary artery without angina pectoris: Secondary | ICD-10-CM

## 2022-11-12 DIAGNOSIS — I252 Old myocardial infarction: Secondary | ICD-10-CM

## 2022-11-12 DIAGNOSIS — Z955 Presence of coronary angioplasty implant and graft: Secondary | ICD-10-CM

## 2022-11-12 DIAGNOSIS — Z87891 Personal history of nicotine dependence: Secondary | ICD-10-CM

## 2022-11-12 DIAGNOSIS — S72102A Unspecified trochanteric fracture of left femur, initial encounter for closed fracture: Secondary | ICD-10-CM

## 2022-11-12 DIAGNOSIS — I119 Hypertensive heart disease without heart failure: Secondary | ICD-10-CM

## 2022-11-12 DIAGNOSIS — S72002D Fracture of unspecified part of neck of left femur, subsequent encounter for closed fracture with routine healing: Secondary | ICD-10-CM | POA: Diagnosis not present

## 2022-11-12 HISTORY — PX: FEMUR IM NAIL: SHX1597

## 2022-11-12 LAB — HEPARIN LEVEL (UNFRACTIONATED): Heparin Unfractionated: 1.08 IU/mL — ABNORMAL HIGH (ref 0.30–0.70)

## 2022-11-12 LAB — ECHOCARDIOGRAM COMPLETE
Area-P 1/2: 2.73 cm2
Calc EF: 59.1 %
Height: 72 in
MV M vel: 2.24 m/s
MV Peak grad: 20.1 mmHg
P 1/2 time: 691 msec
S' Lateral: 2.8 cm
Single Plane A2C EF: 54.4 %
Single Plane A4C EF: 63.7 %
Weight: 2927.71 oz

## 2022-11-12 LAB — SURGICAL PCR SCREEN
MRSA, PCR: NEGATIVE
Staphylococcus aureus: NEGATIVE

## 2022-11-12 LAB — TYPE AND SCREEN
ABO/RH(D): A POS
Antibody Screen: NEGATIVE

## 2022-11-12 LAB — APTT: aPTT: 97 seconds — ABNORMAL HIGH (ref 24–36)

## 2022-11-12 LAB — ABO/RH: ABO/RH(D): A POS

## 2022-11-12 SURGERY — INSERTION, INTRAMEDULLARY ROD, FEMUR
Anesthesia: General | Laterality: Left

## 2022-11-12 MED ORDER — ORAL CARE MOUTH RINSE: 15.0000 mL | Freq: Once | OROMUCOSAL | Status: AC

## 2022-11-12 MED ORDER — FLUTICASONE PROPIONATE 50 MCG/ACT NA SUSP
1.0000 | Freq: Every day | NASAL | Status: DC
  Administered 2022-11-16: 1 via NASAL
  Filled 2022-11-12: qty 16

## 2022-11-12 MED ORDER — ROCURONIUM BROMIDE 10 MG/ML (PF) SYRINGE
PREFILLED_SYRINGE | INTRAVENOUS | Status: DC | PRN
  Administered 2022-11-12: 60 mg via INTRAVENOUS

## 2022-11-12 MED ORDER — FENTANYL CITRATE (PF) 100 MCG/2ML IJ SOLN: 25.0000 ug | INTRAMUSCULAR | Status: DC | PRN

## 2022-11-12 MED ORDER — LACTATED RINGERS IV SOLN: INTRAVENOUS | Status: DC

## 2022-11-12 MED ORDER — PHENOL 1.4 % MT LIQD: 1.0000 | OROMUCOSAL | Status: DC | PRN

## 2022-11-12 MED ORDER — FENTANYL CITRATE (PF) 250 MCG/5ML IJ SOLN
INTRAMUSCULAR | Status: DC | PRN
  Administered 2022-11-12: 50 ug via INTRAVENOUS
  Administered 2022-11-12: 150 ug via INTRAVENOUS

## 2022-11-12 MED ORDER — STERILE WATER FOR IRRIGATION IR SOLN
Status: DC | PRN
  Administered 2022-11-12: 1000 mL

## 2022-11-12 MED ORDER — MENTHOL 3 MG MT LOZG: 1.0000 | LOZENGE | OROMUCOSAL | Status: DC | PRN

## 2022-11-12 MED ORDER — 0.9 % SODIUM CHLORIDE (POUR BTL) OPTIME
TOPICAL | Status: DC | PRN
  Administered 2022-11-12: 1000 mL

## 2022-11-12 MED ORDER — ADULT MULTIVITAMIN W/MINERALS CH
1.0000 | ORAL_TABLET | Freq: Every day | ORAL | Status: DC
  Administered 2022-11-13 – 2022-11-16 (×4): 1 via ORAL
  Filled 2022-11-12 (×4): qty 1

## 2022-11-12 MED ORDER — LIDOCAINE HCL URETHRAL/MUCOSAL 2 % EX GEL
1.0000 | Freq: Once | CUTANEOUS | Status: AC
  Administered 2022-11-12: 1 via URETHRAL
  Filled 2022-11-12: qty 6

## 2022-11-12 MED ORDER — DOCUSATE SODIUM 100 MG PO CAPS
100.0000 mg | ORAL_CAPSULE | Freq: Two times a day (BID) | ORAL | Status: DC
  Administered 2022-11-12 – 2022-11-16 (×4): 100 mg via ORAL
  Filled 2022-11-12 (×6): qty 1

## 2022-11-12 MED ORDER — TRANEXAMIC ACID-NACL 1000-0.7 MG/100ML-% IV SOLN
1000.0000 mg | INTRAVENOUS | Status: AC
  Administered 2022-11-12: 1000 mg via INTRAVENOUS
  Filled 2022-11-12: qty 100

## 2022-11-12 MED ORDER — ACETAMINOPHEN 325 MG PO TABS
650.0000 mg | ORAL_TABLET | Freq: Four times a day (QID) | ORAL | Status: DC
  Administered 2022-11-12 – 2022-11-14 (×5): 650 mg via ORAL
  Filled 2022-11-12 (×5): qty 2

## 2022-11-12 MED ORDER — CEFAZOLIN SODIUM-DEXTROSE 2-4 GM/100ML-% IV SOLN
2.0000 g | INTRAVENOUS | Status: AC
  Administered 2022-11-12: 2 g via INTRAVENOUS
  Filled 2022-11-12: qty 100

## 2022-11-12 MED ORDER — MAGNESIUM OXIDE -MG SUPPLEMENT 400 (240 MG) MG PO TABS
400.0000 mg | ORAL_TABLET | Freq: Every day | ORAL | Status: DC
  Administered 2022-11-13 – 2022-11-16 (×3): 400 mg via ORAL
  Filled 2022-11-12 (×3): qty 1

## 2022-11-12 MED ORDER — ONDANSETRON HCL 4 MG PO TABS: 4.0000 mg | ORAL_TABLET | Freq: Four times a day (QID) | ORAL | Status: DC | PRN

## 2022-11-12 MED ORDER — FENTANYL CITRATE (PF) 100 MCG/2ML IJ SOLN: 50.0000 ug | Freq: Once | INTRAMUSCULAR | Status: DC

## 2022-11-12 MED ORDER — PHENYLEPHRINE HCL-NACL 20-0.9 MG/250ML-% IV SOLN
INTRAVENOUS | Status: DC | PRN
  Administered 2022-11-12: 20 ug/min via INTRAVENOUS

## 2022-11-12 MED ORDER — PROPOFOL 10 MG/ML IV BOLUS
INTRAVENOUS | Status: AC
  Filled 2022-11-12: qty 20

## 2022-11-12 MED ORDER — BUPIVACAINE-EPINEPHRINE (PF) 0.5% -1:200000 IJ SOLN
INTRAMUSCULAR | Status: DC | PRN
  Administered 2022-11-12: 30 mL via PERINEURAL

## 2022-11-12 MED ORDER — DROPERIDOL 2.5 MG/ML IJ SOLN: 0.6250 mg | Freq: Once | INTRAMUSCULAR | Status: DC | PRN

## 2022-11-12 MED ORDER — POVIDONE-IODINE 10 % EX SWAB
2.0000 | Freq: Once | CUTANEOUS | Status: AC
  Administered 2022-11-12: 2 via TOPICAL

## 2022-11-12 MED ORDER — METOCLOPRAMIDE HCL 5 MG PO TABS: 5.0000 mg | ORAL_TABLET | Freq: Three times a day (TID) | ORAL | Status: DC | PRN

## 2022-11-12 MED ORDER — CHLORHEXIDINE GLUCONATE 0.12 % MT SOLN
15.0000 mL | Freq: Once | OROMUCOSAL | Status: AC
  Administered 2022-11-12: 15 mL via OROMUCOSAL
  Filled 2022-11-12: qty 15

## 2022-11-12 MED ORDER — LIDOCAINE 2% (20 MG/ML) 5 ML SYRINGE
INTRAMUSCULAR | Status: DC | PRN
  Administered 2022-11-12: 40 mg via INTRAVENOUS

## 2022-11-12 MED ORDER — DEXAMETHASONE SODIUM PHOSPHATE 10 MG/ML IJ SOLN
INTRAMUSCULAR | Status: DC | PRN
  Administered 2022-11-12: 10 mg via INTRAVENOUS

## 2022-11-12 MED ORDER — LABETALOL HCL 5 MG/ML IV SOLN
INTRAVENOUS | Status: AC
  Filled 2022-11-12: qty 4

## 2022-11-12 MED ORDER — METOCLOPRAMIDE HCL 5 MG/ML IJ SOLN: 5.0000 mg | Freq: Three times a day (TID) | INTRAMUSCULAR | Status: DC | PRN

## 2022-11-12 MED ORDER — CEFAZOLIN SODIUM-DEXTROSE 2-4 GM/100ML-% IV SOLN
2.0000 g | Freq: Four times a day (QID) | INTRAVENOUS | Status: AC
  Administered 2022-11-12 – 2022-11-13 (×2): 2 g via INTRAVENOUS
  Filled 2022-11-12 (×2): qty 100

## 2022-11-12 MED ORDER — PHENYLEPHRINE 80 MCG/ML (10ML) SYRINGE FOR IV PUSH (FOR BLOOD PRESSURE SUPPORT)
PREFILLED_SYRINGE | INTRAVENOUS | Status: DC | PRN
  Administered 2022-11-12 (×3): 160 ug via INTRAVENOUS

## 2022-11-12 MED ORDER — FENTANYL CITRATE (PF) 250 MCG/5ML IJ SOLN
INTRAMUSCULAR | Status: AC
  Filled 2022-11-12: qty 5

## 2022-11-12 MED ORDER — PROPOFOL 10 MG/ML IV BOLUS
INTRAVENOUS | Status: DC | PRN
  Administered 2022-11-12: 120 mg via INTRAVENOUS

## 2022-11-12 MED ORDER — LABETALOL HCL 5 MG/ML IV SOLN
5.0000 mg | INTRAVENOUS | Status: DC | PRN
  Administered 2022-11-12: 5 mg via INTRAVENOUS

## 2022-11-12 MED ORDER — FENTANYL CITRATE (PF) 100 MCG/2ML IJ SOLN
INTRAMUSCULAR | Status: AC
  Administered 2022-11-12: 50 ug
  Filled 2022-11-12: qty 2

## 2022-11-12 MED ORDER — OXYCODONE HCL 5 MG PO TABS
2.5000 mg | ORAL_TABLET | Freq: Four times a day (QID) | ORAL | Status: DC | PRN
  Administered 2022-11-13 – 2022-11-14 (×4): 5 mg via ORAL
  Filled 2022-11-12 (×5): qty 1

## 2022-11-12 MED ORDER — ONDANSETRON HCL 4 MG/2ML IJ SOLN
4.0000 mg | Freq: Four times a day (QID) | INTRAMUSCULAR | Status: DC | PRN
  Administered 2022-11-14: 4 mg via INTRAVENOUS
  Filled 2022-11-12: qty 2

## 2022-11-12 SURGICAL SUPPLY — 52 items
BAG COUNTER SPONGE SURGICOUNT (BAG) ×1 IMPLANT
BAG SPNG CNTER NS LX DISP (BAG) ×1
BIT DRILL 4.3MMS DISTAL GRDTED (BIT) IMPLANT
BNDG CMPR 5X6 CHSV STRCH STRL (GAUZE/BANDAGES/DRESSINGS)
BNDG COHESIVE 6X5 TAN ST LF (GAUZE/BANDAGES/DRESSINGS) IMPLANT
BRUSH SCRUB EZ PLAIN DRY (MISCELLANEOUS) ×2 IMPLANT
COVER PERINEAL POST (MISCELLANEOUS) ×1 IMPLANT
COVER SURGICAL LIGHT HANDLE (MISCELLANEOUS) ×2 IMPLANT
DRAPE C-ARMOR (DRAPES) ×1 IMPLANT
DRAPE HALF SHEET 40X57 (DRAPES) IMPLANT
DRAPE ORTHO SPLIT 77X108 STRL (DRAPES) ×2
DRAPE SURG ORHT 6 SPLT 77X108 (DRAPES) ×2 IMPLANT
DRAPE U-SHAPE 47X51 STRL (DRAPES) ×1 IMPLANT
DRESSING MEPILEX FLEX 4X4 (GAUZE/BANDAGES/DRESSINGS) ×1 IMPLANT
DRILL 4.3MMS DISTAL GRADUATED (BIT) ×1
DRSG MEPILEX FLEX 4X4 (GAUZE/BANDAGES/DRESSINGS) ×1
DRSG MEPILEX POST OP 4X12 (GAUZE/BANDAGES/DRESSINGS) IMPLANT
DRSG MEPILEX POST OP 4X8 (GAUZE/BANDAGES/DRESSINGS) ×1 IMPLANT
ELECT REM PT RETURN 9FT ADLT (ELECTROSURGICAL) ×1
ELECTRODE REM PT RTRN 9FT ADLT (ELECTROSURGICAL) ×1 IMPLANT
GLOVE BIO SURGEON STRL SZ7.5 (GLOVE) ×1 IMPLANT
GLOVE BIO SURGEON STRL SZ8 (GLOVE) ×1 IMPLANT
GLOVE BIOGEL PI IND STRL 7.5 (GLOVE) ×1 IMPLANT
GLOVE BIOGEL PI IND STRL 8 (GLOVE) ×1 IMPLANT
GLOVE SURG ORTHO LTX SZ7.5 (GLOVE) ×2 IMPLANT
GOWN STRL REUS W/ TWL LRG LVL3 (GOWN DISPOSABLE) ×2 IMPLANT
GOWN STRL REUS W/ TWL XL LVL3 (GOWN DISPOSABLE) ×1 IMPLANT
GOWN STRL REUS W/TWL LRG LVL3 (GOWN DISPOSABLE) ×2
GOWN STRL REUS W/TWL XL LVL3 (GOWN DISPOSABLE) ×1
GUIDEPIN VERSANAIL DSP 3.2X444 (ORTHOPEDIC DISPOSABLE SUPPLIES) IMPLANT
GUIDEWIRE BALL NOSE 100CM (WIRE) IMPLANT
HFN RH 130 DEG 11MM X 420MM (Nail) IMPLANT
KIT BASIN OR (CUSTOM PROCEDURE TRAY) ×1 IMPLANT
KIT TURNOVER KIT B (KITS) ×1 IMPLANT
MANIFOLD NEPTUNE II (INSTRUMENTS) ×1 IMPLANT
NS IRRIG 1000ML POUR BTL (IV SOLUTION) ×1 IMPLANT
PACK GENERAL/GYN (CUSTOM PROCEDURE TRAY) ×1 IMPLANT
PAD ARMBOARD 7.5X6 YLW CONV (MISCELLANEOUS) ×2 IMPLANT
SCREW BONE CORTICAL 5.0X52 (Screw) IMPLANT
SCREW LAG 10.5MMX105MM HFN (Screw) IMPLANT
STAPLER VISISTAT 35W (STAPLE) ×1 IMPLANT
STOCKINETTE IMPERVIOUS LG (DRAPES) IMPLANT
SUT ETHILON 2 0 FS 18 (SUTURE) ×1 IMPLANT
SUT VIC AB 0 CT1 27 (SUTURE) ×2
SUT VIC AB 0 CT1 27XBRD ANBCTR (SUTURE) ×1 IMPLANT
SUT VIC AB 1 CT1 27 (SUTURE) ×1
SUT VIC AB 1 CT1 27XBRD ANBCTR (SUTURE) ×1 IMPLANT
SUT VIC AB 2-0 CT1 27 (SUTURE) ×2
SUT VIC AB 2-0 CT1 TAPERPNT 27 (SUTURE) ×1 IMPLANT
TOWEL GREEN STERILE (TOWEL DISPOSABLE) ×2 IMPLANT
TOWEL GREEN STERILE FF (TOWEL DISPOSABLE) ×1 IMPLANT
WATER STERILE IRR 1000ML POUR (IV SOLUTION) ×1 IMPLANT

## 2022-11-12 NOTE — Progress Notes (Signed)
ANTICOAGULATION CONSULT NOTE  Pharmacy Consult for Heparin Indication: atrial fibrillation  No Known Allergies  Patient Measurements: Height: 6' (182.9 cm) Weight: 83 kg (182 lb 15.7 oz) IBW/kg (Calculated) : 77.6 Heparin Dosing Weight: 83 kg  Vital Signs: Temp: 97.9 F (36.6 C) (06/03 0855) Temp Source: Oral (06/03 0754) BP: 128/73 (06/03 0957) Pulse Rate: 69 (06/03 0957)  Labs: Recent Labs    11/11/22 1641 11/12/22 1017  HGB 14.5  --   HCT 42.8  --   PLT 188  --   APTT  --  97*  HEPARINUNFRC  --  1.08*  CREATININE 1.25*  --     Estimated Creatinine Clearance: 47.4 mL/min (A) (by C-G formula based on SCr of 1.25 mg/dL (H)).   Assessment: 6 yoM with PMH of afib (eliquis) who is admitted after fall and left sided hip pain. Last dose of Elqiuis 6/02 AM. Pharmacy consulted to dose heparin in anticipation of OR procedure.   Heparin level falsely elevated due to recent Eliquis. aPTT therapeutic 97 at 1150 units/hr. No issues with heparin infusion or signs of bleeding. Heparin now held for procedure later this afternoon.   Goal of Therapy:  Heparin level 0.3-0.7 units/ml aPTT 66-102 seconds Monitor platelets by anticoagulation protocol: Yes   Plan:  F/u resuming heparin infusion after procedure Check aPTT/heparin level daily while on heparin Continue to monitor H&H and platelets  Thank you for allowing pharmacy to be a part of this patient's care.  Thelma Barge, PharmD Clinical Pharmacist

## 2022-11-12 NOTE — Progress Notes (Signed)
Initial Nutrition Assessment  DOCUMENTATION CODES:   Not applicable  INTERVENTION:  Multivitamin w/ minerals daily Recommend regular diet once appropriate for advancement.   NUTRITION DIAGNOSIS:  Increased nutrient needs related to hip fracture as evidenced by estimated needs.  GOAL:  Patient will meet greater than or equal to 90% of their needs  MONITOR:  PO intake, I & O's, Labs  REASON FOR ASSESSMENT:  Consult Hip fracture protocol  ASSESSMENT:  88 y.o. male presented to the ED after a mechanical fall on L hip. PMH includes CAD, HLD, HTN, and A. Fib. Pt admitted with L femoral intertrochanter fracture.   Currently NPO for OR this afternoon.   Pt laying in bed, reports that his appetite had been poor for about 3 weeks PTA, was unsure reason for decreased appetite. Stated that it had just started to improve. Was not taking any oral nutrition supplements at home. Denies any nausea or vomiting.  Pt reports a UBW of 210# (years ago), tried to get down to 190# intentionally, but continued to drop to 175# without trying.   Discussed the use of oral nutrition supplements post-op due to increased healing needs and recent poor PO intake. Pt declined as he can get them from home.   Medications reviewed: Magnesium Oxide Labs reviewed.   NUTRITION - FOCUSED PHYSICAL EXAM: Deferred to follow-up.  Diet Order:   Diet Order             Diet NPO time specified Except for: Sips with Meds  Diet effective midnight                  EDUCATION NEEDS:   Education needs have been addressed  Skin:  Skin Assessment: Reviewed RN Assessment  Last BM:  6/2  Height:  Ht Readings from Last 1 Encounters:  11/11/22 6' (1.829 m)   Weight:  Wt Readings from Last 1 Encounters:  11/11/22 83 kg   Ideal Body Weight:  80.9 kg  BMI:  Body mass index is 24.82 kg/m.  Estimated Nutritional Needs:  Kcal:  1950-2150 Protein:  100-120 grams Fluid:  >/= 1.9 L   Kirby Crigler RD,  LDN Clinical Dietitian See [facility] for contact information.

## 2022-11-12 NOTE — Consult Note (Signed)
Reason for Consult:Left hip fx Referring Physician: Marcellus Scott Time called: 1145 Time at bedside: 1154   Luis [name] is an 88 y.o. male.  HPI: Luis [name] was on a small stool about 1 foot off the ground when he lost his balance and fell. He had immediate left hip pain and could not get up. He was brought to the ED where x-rays showed a left hip fx and orthopedic surgery was consulted. He lives at home with his wife and does not use any assistive devices to ambulate.  Past Medical History:  Diagnosis Date   Anal abscess    CAD (coronary artery disease)    a. BMS-RCA, BMS-Cx, PTCA of OM1 in 2001. b. overlapping DES in RCA and PTCA to small distal Cx 2013. c. Cath 2016 -> stable CAD, suspect dyspnea unrelated to coronary disease.   COLONIC POLYPS, HX OF    Dr Kinnie Scales   DJD (degenerative joint disease)    Essential hypertension    Hyperlipidemia    Myocardial infarction (HCC) 08/1999   Persistent atrial fibrillation (HCC)    a. dx 2016.   RBBB    TIA (transient ischemic attack)     Past Surgical History:  Procedure Laterality Date   CARDIAC CATHETERIZATION  ~ 2002   CARDIAC CATHETERIZATION N/A 03/02/2015   Procedure: Left Heart Cath and Coronary Angiography;  Surgeon: Tonny Bollman, MD;  Location: [facility] INVASIVE CV LAB;  Service: Cardiovascular;  Laterality: N/A;   CATARACT EXTRACTION W/ INTRAOCULAR LENS  IMPLANT, BILATERAL  ~ 2007   bilaterally   CORONARY ANGIOPLASTY  07/30/11   CORONARY ANGIOPLASTY WITH STENT PLACEMENT  2001; 07/30/11;   "2+3"   INGUINAL HERNIA REPAIR  2005   left   LEFT HEART CATHETERIZATION WITH CORONARY ANGIOGRAM N/A 07/30/2011   Procedure: LEFT HEART CATHETERIZATION WITH CORONARY ANGIOGRAM;  Surgeon: Kathleene Hazel, MD;  Location: [facility] CATH LAB;  Service: Cardiovascular;  Laterality: N/A;   PERCUTANEOUS CORONARY STENT INTERVENTION (PCI-S)  07/30/2011   Procedure: PERCUTANEOUS CORONARY STENT INTERVENTION (PCI-S);  Surgeon: Kathleene Hazel, MD;   Location: [facility] CATH LAB;  Service: Cardiovascular;;   RETINAL DETACHMENT SURGERY  05/2008; 06/2008; 07/2008   left; "all 3 surgeries have failed"    Family History  Problem Relation Age of Onset   Alcohol abuse Mother    Colon cancer Father        in 65s   Alcoholism Sister     Social History:  reports that he quit smoking about 23 years ago. His smoking use included cigarettes. He has a 20.00 pack-year smoking history. He has never used smokeless tobacco. He reports that he does not drink alcohol and does not use drugs.  Allergies: No Known Allergies  Medications: I have reviewed the patient's current medications.  Results for orders placed or performed during the hospital encounter of 11/11/22 (from the past 48 hour(s))  CBC     Status: None   Collection Time: 11/11/22  4:41 PM  Result Value Ref Range   WBC 7.1 4.0 - 10.5 K/uL   RBC 4.57 4.22 - 5.81 MIL/uL   Hemoglobin 14.5 13.0 - 17.0 g/dL   HCT 16.1 09.6 - 04.5 %   MCV 93.7 80.0 - 100.0 fL   MCH 31.7 26.0 - 34.0 pg   MCHC 33.9 30.0 - 36.0 g/dL   RDW 40.9 81.1 - 91.4 %   Platelets 188 150 - 400 K/uL   nRBC 0.0 0.0 - 0.2 %    Comment: Performed  at [facility], [address] [place], [place] [address]  Basic metabolic panel     Status: Abnormal   Collection Time: 11/11/22  4:41 PM  Result Value Ref Range   Sodium 136 135 - 145 mmol/L   Potassium 4.2 3.5 - 5.1 mmol/L   Chloride 105 98 - 111 mmol/L   CO2 22 22 - 32 mmol/L   Glucose, Bld 144 (H) 70 - 99 mg/dL    Comment: Glucose reference range applies only to samples taken after fasting for at least 8 hours.   BUN 13 8 - 23 mg/dL   Creatinine, Ser 4.69 (H) 0.61 - 1.24 mg/dL   Calcium 8.8 (L) 8.9 - 10.3 mg/dL   GFR, Estimated 56 (L) >60 mL/min    Comment: (NOTE) Calculated using the CKD-EPI Creatinine Equation (2021)    Anion gap 9 5 - 15    Comment: Performed at [facility], [address] [place], [place] [address]  ABO/Rh     Status: None    Collection Time: 11/11/22  4:41 PM  Result Value Ref Range   ABO/RH(D)      A POS Performed at [facility], [address] [place], [place] [address]   Surgical pcr screen     Status: None   Collection Time: 11/11/22 11:31 PM   Specimen: Nasal Mucosa; Nasal Swab  Result Value Ref Range   MRSA, PCR NEGATIVE NEGATIVE   Staphylococcus aureus NEGATIVE NEGATIVE    Comment: (NOTE) The Xpert SA Assay (FDA approved for NASAL specimens in patients 19 years of age and older), is one component of a comprehensive surveillance program. It is not intended to diagnose infection nor to guide or monitor treatment. Performed at [facility], [address] [place], [place] [address]   Type and screen Order type and screen if day of surgery is less than 15 days from draw of preadmission visit or order morning of surgery if day of surgery is greater than 6 days from preadmission visit.     Status: None   Collection Time: 11/11/22 11:58 PM  Result Value Ref Range   ABO/RH(D) A POS    Antibody Screen NEG    Sample Expiration      11/14/2022,2359 Performed at [facility], [address] [place], [place] [address]   APTT     Status: Abnormal   Collection Time: 11/12/22 10:17 AM  Result Value Ref Range   aPTT 97 (H) 24 - 36 seconds    Comment:        IF BASELINE aPTT IS ELEVATED, SUGGEST PATIENT RISK ASSESSMENT BE USED TO DETERMINE APPROPRIATE ANTICOAGULANT THERAPY. Performed at [facility], [address], [place], [place] [address]   Heparin level (unfractionated)     Status: Abnormal   Collection Time: 11/12/22 10:17 AM  Result Value Ref Range   Heparin Unfractionated 1.08 (H) 0.30 - 0.70 IU/mL    Comment: (NOTE) The clinical reportable range upper limit is being lowered to >1.10 to align with the FDA approved guidance for the current laboratory assay.  If heparin results are below expected values, and patient dosage has  been confirmed, suggest follow up  testing of antithrombin III levels. Performed at [facility], [address], [place], [place] [address]     DG Hip Lucienne Capers or Wo Pelvis 2-3 Views Left  Result Date: 11/11/2022 CLINICAL DATA:  Trauma, fall EXAM: DG HIP (WITH  OR WITHOUT PELVIS) 2-3V LEFT COMPARISON:  None Available. FINDINGS: Acute comminuted intertrochanteric fracture is seen in the neck of left femur extending into the proximal shaft. There is a 19 mm offset in alignment of lateral cortical margins in the fracture. There is no dislocation. There is mild narrowing of lateral aspect of left hip joint space. Arterial calcifications are seen in soft tissues. IMPRESSION: Comminuted displaced recent fracture is seen in the intertrochanteric portion of left femur extending into the proximal shaft. Electronically Signed   By: Ernie Avena M.D.   On: 11/11/2022 17:52   DG Shoulder Left  Result Date: 11/11/2022 CLINICAL DATA:  Trauma, fall EXAM: LEFT SHOULDER - 2+ VIEW COMPARISON:  None FINDINGS: No fracture or dislocation is seen. Small bony spurs seen in [facility] joint. Calcifications are seen in thoracic aorta. IMPRESSION: No recent fracture or dislocation is seen in left shoulder. Degenerative changes are noted in left AC joint. Electronically Signed   By: Ernie Avena M.D.   On: 11/11/2022 17:50   DG Chest Portable 1 View  Result Date: 11/11/2022 CLINICAL DATA:  Fall EXAM: PORTABLE CHEST 1 VIEW COMPARISON:  Chest x-ray 03/04/2017 FINDINGS: The heart size and mediastinal contours are within normal limits. There are atherosclerotic calcifications of the aorta. Both lungs are clear. The visualized skeletal structures are unremarkable. IMPRESSION: No active disease. Electronically Signed   By: Darliss Cheney M.D.   On: 11/11/2022 17:49    Review of Systems  HENT:  Negative for ear discharge, ear pain, hearing loss and tinnitus.   Eyes:  Negative for photophobia and pain.  Respiratory:  Negative for cough and shortness of  breath.   Cardiovascular:  Negative for chest pain.  Gastrointestinal:  Negative for abdominal pain, nausea and vomiting.  Genitourinary:  Negative for dysuria, flank pain, frequency and urgency.  Musculoskeletal:  Positive for arthralgias (Left hip). Negative for back pain, myalgias and neck pain.  Neurological:  Negative for dizziness and headaches.  Hematological:  Does not bruise/bleed easily.  Psychiatric/Behavioral:  The patient is not nervous/anxious.    Blood pressure 128/73, pulse 69, temperature 97.9 F (36.6 C), resp. rate 18, height 6' (1.829 m), weight 83 kg, SpO2 97 %. Physical Exam Constitutional:      General: He is not in acute distress.    Appearance: He is well-developed. He is not diaphoretic.  HENT:     Head: Normocephalic and atraumatic.  Eyes:     General: No scleral icterus.       Right eye: No discharge.        Left eye: No discharge.     Conjunctiva/sclera: Conjunctivae normal.  Cardiovascular:     Rate and Rhythm: Normal rate and regular rhythm.  Pulmonary:     Effort: Pulmonary effort is normal. No respiratory distress.  Musculoskeletal:     Cervical back: Normal range of motion.     Comments: LLE No traumatic wounds, ecchymosis, or rash  Mild TTP hip  No knee or ankle effusion  Knee stable to varus/ valgus and anterior/posterior stress  Sens DPN, SPN, TN intact  Motor EHL, ext, flex, evers 5/5  DP 2+, PT 1+, No significant edema  Skin:    General: Skin is warm and dry.  Neurological:     Mental Status: He is alert.  Psychiatric:        Mood and Affect: Mood normal.        Behavior: Behavior normal.     Assessment/Plan: Left hip fx -- Plan  IMN today with Dr. Carola Frost. Please keep NPO. Multiple medical problems including CAD, HTN, HLD, RBBB, TIA, mild carotid disease, and persistent A-fib on Eliquis -- per primary service    Freeman Caldron, PA-C Orthopedic Surgery [phone] 11/12/2022, 12:12 PM

## 2022-11-12 NOTE — Transfer of Care (Addendum)
Immediate Anesthesia Transfer of Care Note  Patient: Luis [name]  Procedure(s) Performed: INTRAMEDULLARY (IM) NAIL HIP FRACTURE (Left)  Patient Location: PACU  Anesthesia Type:GA combined with regional for post-op pain  Level of Consciousness: awake and alert   Airway & Oxygen Therapy: Patient Spontanous Breathing and Patient connected to nasal cannula oxygen  Post-op Assessment: Report given to RN and Post -op Vital signs reviewed and stable  Post vital signs: Reviewed and stable  Last Vitals:  Vitals Value Taken Time  BP 160/90 11/12/22 1947  Temp    Pulse 89 11/12/22 1951  Resp 18 11/12/22 1951  SpO2 99 % 11/12/22 1951  Vitals shown include unvalidated device data.  Last Pain:  Vitals:   11/12/22 1651  TempSrc: Oral  PainSc: 0-No pain      Patients Stated Pain Goal: 3 (11/12/22 1651)  Complications: No notable events documented.

## 2022-11-12 NOTE — Anesthesia Preprocedure Evaluation (Addendum)
Anesthesia Evaluation  Patient identified by MRN, date of birth, ID band Patient awake    Reviewed: Allergy & Precautions, NPO status , Patient's Chart, lab work & pertinent test results  History of Anesthesia Complications Negative for: history of anesthetic complications  Airway Mallampati: II  TM Distance: >3 FB Neck ROM: Full    Dental no notable dental hx.    Pulmonary former smoker   Pulmonary exam normal        Cardiovascular hypertension, + CAD, + Past MI and + Cardiac Stents  Normal cardiovascular exam+ dysrhythmias Atrial Fibrillation      Neuro/Psych    Depression    TIA   GI/Hepatic negative GI ROS, Neg liver ROS,,,  Endo/Other  negative endocrine ROS    Renal/GU negative Renal ROS  negative genitourinary   Musculoskeletal  (+) Arthritis ,    Abdominal   Peds  Hematology negative hematology ROS (+)   Anesthesia Other Findings Day of surgery medications reviewed with patient.  Reproductive/Obstetrics negative OB ROS                              Anesthesia Physical Anesthesia Plan  ASA: 2  Anesthesia Plan: Regional   Post-op Pain Management:    Induction:   PONV Risk Score and Plan: 1 and Treatment may vary due to age or medical condition  Airway Management Planned: Natural Airway  Additional Equipment: None  Intra-op Plan:   Post-operative Plan:   Informed Consent: I have reviewed the patients History and Physical, chart, labs and discussed the procedure including the risks, benefits and alternatives for the proposed anesthesia with the patient or authorized representative who has indicated his/her understanding and acceptance.     Consent reviewed with POA  Plan Discussed with: CRNA  Anesthesia Plan Comments:         Anesthesia Quick Evaluation

## 2022-11-12 NOTE — Op Note (Signed)
11/11/2022 - 11/12/2022  6:14 PM  PATIENT:  Luis [name]  11-Jan-1935 male   MEDICAL RECORD NUMBER: [id number]  PRE-OPERATIVE DIAGNOSIS:  LEFT PERITROCHANTERIC HIP FRACTURE  POST-OPERATIVE DIAGNOSIS:  LEFT PERITROCHANTERIC HIP FRACTURE  PROCEDURE:  INTRAMEDULLARY NAILING OF THE LEFT HIP using a [employer] Affixus nail.  SURGEON:  Doralee Albino. Carola Frost, M.D.  ASSISTANT:  Montez Morita, PA-C.  ANESTHESIA:  General.  COMPLICATIONS:  None.  ESTIMATED BLOOD LOSS:  200 mL.  DISPOSITION:  To PACU.  CONDITION:  Stable.  DELAY START OF DVT PROPHYLAXIS BECAUSE OF BLEEDING RISK: NO  BRIEF SUMMARY AND INDICATION OF PROCEDURE:  Luis [name] is a 88 y.o. year- old with multiple medical problems and chronic anticoagulation with Eliquis.  I discussed with the patient and his wife risks and benefits of surgical treatment including the potential for malunion, nonunion, symptomatic hardware, heart attack, stroke, neurovascular injury, bleeding, and others.  After full discussion, the patient and family wished to proceed.  BRIEF SUMMARY OF PROCEDURE:  The patient was taken to the operating room where general anesthesia was induced.  He was positioned supine on the Hana fracture table.  A closed reduction maneuver was performed of the fractured proximal femur and this was confirmed on both AP and lateral xray views. A thorough scrub and wash with chlorhexidine and then Betadine scrub and paint was performed.  After sterile drapes and time-out, a long instrument was used to identify the appropriate starting position under C-arm on both AP and lateral images.  A 3 cm incision was made proximal to the greater trochanter.  The curved cannulated awl was inserted just medial to the tip of the lateral trochanter and then the starting guidewire advanced into the proximal femur.  This was checked on AP and lateral views.  The starting reamer was engaged with the soft tissue protected by a sleeve.  The curved  ball-tipped guidewire was then inserted, making sure it was just posterior as possible in the distal femur and across the fracture site, which stayed in a reduced position.  It was sequentially reamed up to 12.5 mm and an 11 x 420 mm nail inserted to the appropriate depth.  The guidewire for the lag screw was then inserted with the appropriate anteversion to make sure it was in a center-center position.  This was measured and the lag screw placed with excellent purchase and position checked on both views. Because of the left sided fracture and potential for malrotation a second pin was placed in parallel to reduce the chance of rotation during lag screw entry. It was subsequently removed after seating. The set screw was then engaged within the groove of the lag screw, which was allowed to telescope.  Traction was released and compression achieved with the screw.  This was followed by placement of one distal locking screw using perfect circle technique.  This was confirmed on AP and lateral images. Wounds were irrigated thoroughly, closed in a standard layered fashion. Sterile gently compressive dressings were applied.  Montez Morita, PA-C, assisted throughout.  The patient was awakened from anesthesia and transported to the PACU in stable condition.  PROGNOSIS:  The patient will be weightbearing as tolerated with physical Therapy, resuming Eliquis tomorrow. He has no range of motion precautions.  We will continue to follow through at the hospital.  Anticipate follow up in the office in 2 weeks for removal of sutures and further evaluation.     Doralee Albino. Carola Frost, M.D.

## 2022-11-12 NOTE — TOC CAGE-AID Note (Signed)
Transition of Care [facility]) - CAGE-AID Screening   Patient Details  Name: Luis [name] MRN: [id number] Date of Birth: [birth year]  Transition of Care [facility]) CM/SW Contact:    Leota Sauers, RN Phone Number: 11/12/2022, 8:32 PM   Clinical Narrative:  Denies use of drugs or illicit drugs. Education not offered at this time.  CAGE-AID Screening:    Have You Ever Felt You Ought to Cut Down on Your Drinking or Drug Use?: No Have People Annoyed You By Critizing Your Drinking Or Drug Use?: No Have You Felt Bad Or Guilty About Your Drinking Or Drug Use?: No Have You Ever Had a Drink or Used Drugs First Thing In The Morning to Steady Your Nerves or to Get Rid of a Hangover?: No CAGE-AID Score: 0  Substance Abuse Education Offered: No

## 2022-11-12 NOTE — Progress Notes (Signed)
  Echocardiogram 2D Echocardiogram has been performed.  Luis [name] 11/12/2022, 1:54 PM

## 2022-11-12 NOTE — Anesthesia Preprocedure Evaluation (Addendum)
Anesthesia Evaluation  Patient identified by MRN, date of birth, ID band Patient awake    Reviewed: Allergy & Precautions, NPO status , Patient's Chart, lab work & pertinent test results  History of Anesthesia Complications Negative for: history of anesthetic complications  Airway Mallampati: II  TM Distance: >3 FB Neck ROM: Full    Dental  (+) Implants, Partial Lower   Pulmonary former smoker   Pulmonary exam normal        Cardiovascular hypertension, Pt. on medications + CAD, + Past MI and + Cardiac Stents  + dysrhythmias Atrial Fibrillation  Rhythm:Irregular Rate:Normal  Echo 11/12/2022  1. Left ventricular ejection fraction, by estimation, is 60 to 65%. The left ventricle has normal function. The left ventricle has no regional wall motion abnormalities. There is mild concentric left ventricular hypertrophy. Left ventricular diastolic function could not be evaluated.   2. Right ventricular systolic function is normal. The right ventricular size is normal.   3. Left atrial size was mild to moderately dilated.   4. The mitral valve is normal in structure. Trivial mitral valve regurgitation. No evidence of mitral stenosis.   5. The aortic valve is tricuspid. There is moderate calcification of the aortic valve. Aortic valve regurgitation is mild. Aortic valve sclerosis/calcification is present, without any evidence of aortic stenosis. Aortic regurgitation PHT measures 691 msec.   6. Aortic dilatation noted. There is borderline dilatation of the ascending aorta, measuring 38 mm.      Neuro/Psych  PSYCHIATRIC DISORDERS  Depression    TIA   GI/Hepatic negative GI ROS, Neg liver ROS,,,  Endo/Other  negative endocrine ROS    Renal/GU negative Renal ROS     Musculoskeletal  (+) Arthritis ,    Abdominal   Peds  Hematology negative hematology ROS (+)   Anesthesia Other Findings Day of surgery medications reviewed with  patient.  Reproductive/Obstetrics negative OB ROS                             Anesthesia Physical Anesthesia Plan  ASA: 3  Anesthesia Plan: General   Post-op Pain Management: Ofirmev IV (intra-op)* and Regional block*   Induction: Intravenous  PONV Risk Score and Plan: 3 and Treatment may vary due to age or medical condition, Ondansetron and Dexamethasone  Airway Management Planned: Oral ETT  Additional Equipment: None  Intra-op Plan:   Post-operative Plan: Extubation in OR  Informed Consent: I have reviewed the patients History and Physical, chart, labs and discussed the procedure including the risks, benefits and alternatives for the proposed anesthesia with the patient or authorized representative who has indicated his/her understanding and acceptance.     Dental advisory given and Consent reviewed with POA  Plan Discussed with: CRNA  Anesthesia Plan Comments:        Anesthesia Quick Evaluation

## 2022-11-12 NOTE — Anesthesia Procedure Notes (Signed)
Procedure Name: Intubation Date/Time: 11/12/2022 6:12 PM  Performed by: Gwenyth Allegra, CRNAPre-anesthesia Checklist: Patient identified, Emergency Drugs available, Suction available and Patient being monitored Patient Re-evaluated:Patient Re-evaluated prior to induction Oxygen Delivery Method: Circle System Utilized Preoxygenation: Pre-oxygenation with 100% oxygen Induction Type: IV induction Ventilation: Mask ventilation without difficulty Laryngoscope Size: Mac and 4 Grade View: Grade II Tube type: Oral Number of attempts: 1 Airway Equipment and Method: Stylet and Oral airway Placement Confirmation: ETT inserted through vocal cords under direct vision, positive ETCO2 and breath sounds checked- equal and bilateral Tube secured with: Tape Dental Injury: Teeth and Oropharynx as per pre-operative assessment

## 2022-11-12 NOTE — Anesthesia Procedure Notes (Signed)
Anesthesia Regional Block: Femoral nerve block   Pre-Anesthetic Checklist: , timeout performed,  Correct Patient, Correct Site, Correct Laterality,  Correct Procedure, Correct Position, site marked,  Risks and benefits discussed,  Pre-op evaluation,  At surgeon's request and post-op pain management  Laterality: Left  Prep: Maximum Sterile Barrier Precautions used, chloraprep       Needles:  Injection technique: Single-shot  Needle Type: Echogenic Stimulator Needle     Needle Length: 9cm  Needle Gauge: 22     Additional Needles:   Procedures:,,,, ultrasound used (permanent image in chart),,    Narrative:  Start time: 11/12/2022 9:20 AM End time: 11/12/2022 9:25 AM Injection made incrementally with aspirations every 5 mL.  Performed by: Personally  Anesthesiologist: Kaylyn Layer, MD  Additional Notes: Risks, benefits, and alternative discussed. Patient gave consent for procedure. Patient prepped and draped in sterile fashion. Sedation administered, patient remains easily responsive to voice. Relevant anatomy identified with ultrasound guidance. Local anesthetic given in 5cc increments with no signs or symptoms of intravascular injection. No pain or paraesthesias with injection. Patient monitored throughout procedure with signs of LAST or immediate complications. Tolerated well. Ultrasound image placed in chart.  Amalia Greenhouse, MD

## 2022-11-12 NOTE — Progress Notes (Addendum)
PROGRESS NOTE   Luis [name]  JWJ:[id number]    DOB: Jul 08, 1934    DOA: 11/11/2022  PCP: Ardith Dark, MD   I have briefly reviewed patients previous medical records in [facility].  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 Clifton James, HTN, HLD, RBBB, TIA, mild carotid disease, persistent A-fib on Eliquis, sustained a mechanical fall at home and admitted for left hip fracture and pain.  Orthopedics consulted and plan for surgery 6/3 at 4:13 PM.  Underwent left femoral nerve block this morning.   Assessment & Plan:  Principal Problem:   Hip fracture (HCC) Active Problems:   Atrial fibrillation (HCC)   Coronary atherosclerosis   Closed displaced fracture of left femoral neck (HCC)   Left intertrochanteric femoral fracture: - Sustained s/p mechanical fall at home - Orthopedics/Dr. Carola Frost consulted and plan for IM nail.  Reportedly scheduled for 6/3 at 4:13 PM.  Patient underwent left femoral nerve block by anesthesia this morning. - Currently on IV heparin drip for A-fib indication.  This has to be held when appropriate by the orthopedic team prior to surgery.  RN to coordinate with the surgeons. - Multimodality pain control.  Delirium precautions. - He has no anginal symptoms, has good exercise tolerance and is medically optimized for needed surgery with acceptable risks.  Recommend close perioperative monitoring.  Acute urinary retention: - Per spouse, had not voided since hospital admission. - Bladder scan 600 mL. - Place Foley catheter which can be discontinued on postop day 1.  Persistent A-fib: - Not on rate control medications.  Controlled ventricular rate on telemetry. - On Eliquis at home, currently on hold and on heparin  bridging for surgery. - Hold heparin prior to surgery when appropriate by orthopedics and resume Eliquis postop when cleared by orthopedics.  CAD s/p remote MI, and PCI - Last cath in 2016 showed nonobstructive disease and stable CAD. - No anginal symptoms. - Continue atorvastatin, Zetia.  Not on aspirin due to Eliquis. - Follows with outpatient cardiology/Dr. Clifton James.  Essential hypertension: - Controlled on no meds PTA.  Monitor.  Hyperlipidemia: - Continue atorvastatin and Zetia.  Anxiety and depression: Continue home dose of bupropion, Prozac.  Stage IIIa CKD Creatinine baseline is in the 1.2 range, currently at baseline.  Follow postop BMP.  Body mass index is 24.82 kg/m.   ACP Documents: None. DVT prophylaxis:   Currently on IV heparin bridging   Code Status: Full Code:  Family Communication: Spouse at bedside Disposition:  Status is: Inpatient Remains inpatient appropriate because: Left hip fracture and need for surgery.     Consultants:   Orthopedics  Procedures:     Antimicrobials:      Subjective:  Seen this morning.  Spouse at bedside.  States that he has not had chest pain since his heart attack in 2001.  Physically active.  No dyspnea or palpitations.  No dizziness or lightheadedness.  Currently without left hip pain but is not moving the hip much.  Spouse indicates that the surgery is planned this afternoon for 4:13 PM.  Objective:   Vitals:   11/12/22 0855 11/12/22 0920 11/12/22 0930 11/12/22 0957  BP: (!) 155/75 (!) 144/72 127/77 128/73  Pulse: 77  75 81 69  Resp: (!) 25 18 17 18   Temp: 97.9 F (36.6 C)     TempSrc:      SpO2: 96% 97% 97% 97%  Weight:      Height:        General exam: Elderly male, moderately built and nourished lying comfortably supine in bed without distress.  Oral mucosa moist. Respiratory system: Clear to auscultation. Respiratory effort normal. Cardiovascular system: S1 & S2 heard, irregularly regular and  controlled ventricular rate. No JVD, murmurs, rubs, gallops or clicks. No pedal edema. Gastrointestinal system: Abdomen is nondistended, soft and nontender. No organomegaly or masses felt. Normal bowel sounds heard. Central nervous system: Alert and oriented. No focal neurological deficits. Extremities: Symmetric 5 x 5 power except left lower extremity which is limited by fracture related pain.  Left lower extremity shortened and externally rotated.  Neurovascular bundle intact. Skin: No rashes, lesions or ulcers Psychiatry: Judgement and insight appear normal. Mood & affect appropriate.     Data Reviewed:   I have personally reviewed following labs and imaging studies   CBC: Recent Labs  Lab 11/11/22 1641  WBC 7.1  HGB 14.5  HCT 42.8  MCV 93.7  PLT 188    Basic Metabolic Panel: Recent Labs  Lab 11/11/22 1641  NA 136  K 4.2  CL 105  CO2 22  GLUCOSE 144*  BUN 13  CREATININE 1.25*  CALCIUM 8.8*    Liver Function Tests: No results for input(s): "AST", "ALT", "ALKPHOS", "BILITOT", "PROT", "ALBUMIN" in the last 168 hours.  CBG: No results for input(s): "GLUCAP" in the last 168 hours.  Microbiology Studies:   Recent Results (from the past 240 hour(s))  Surgical pcr screen     Status: None   Collection Time: 11/11/22 11:31 PM   Specimen: Nasal Mucosa; Nasal Swab  Result Value Ref Range Status   MRSA, PCR NEGATIVE NEGATIVE Final   Staphylococcus aureus NEGATIVE NEGATIVE Final    Comment: (NOTE) The Xpert SA Assay (FDA approved for NASAL specimens in patients 50 years of age and older), is one component of a comprehensive surveillance program. It is not intended to diagnose infection nor to guide or monitor treatment. Performed at [facility], [address] [place], [place] [address]     Radiology Studies:  DG Hip Unilat W or Wo Pelvis 2-3 Views Left  Result Date: 11/11/2022 CLINICAL DATA:  Trauma, fall EXAM: DG HIP (WITH OR WITHOUT PELVIS) 2-3V LEFT  COMPARISON:  None Available. FINDINGS: Acute comminuted intertrochanteric fracture is seen in the neck of left femur extending into the proximal shaft. There is a 19 mm offset in alignment of lateral cortical margins in the fracture. There is no dislocation. There is mild narrowing of lateral aspect of left hip joint space. Arterial calcifications are seen in soft tissues. IMPRESSION: Comminuted displaced recent fracture is seen in the intertrochanteric portion of left femur extending into the proximal shaft. Electronically Signed   By: Ernie Avena M.D.   On: 11/11/2022 17:52   DG Shoulder Left  Result Date: 11/11/2022 CLINICAL DATA:  Trauma, fall EXAM: LEFT SHOULDER - 2+ VIEW COMPARISON:  None FINDINGS: No fracture or dislocation is seen. Small bony spurs seen in [facility] joint. Calcifications are seen in thoracic aorta. IMPRESSION: No recent fracture or dislocation is seen in left shoulder. Degenerative changes are noted in left AC joint. Electronically Signed   By: Ernie Avena M.D.   On: 11/11/2022 17:50   DG Chest Portable  1 View  Result Date: 11/11/2022 CLINICAL DATA:  Fall EXAM: PORTABLE CHEST 1 VIEW COMPARISON:  Chest x-ray 03/04/2017 FINDINGS: The heart size and mediastinal contours are within normal limits. There are atherosclerotic calcifications of the aorta. Both lungs are clear. The visualized skeletal structures are unremarkable. IMPRESSION: No active disease. Electronically Signed   By: Darliss Cheney M.D.   On: 11/11/2022 17:49    Scheduled Meds:    atorvastatin  80 mg Oral Daily   buPROPion  150 mg Oral Daily   ezetimibe  10 mg Oral Daily   FLUoxetine  20 mg Oral Daily    Continuous Infusions:    heparin 1,150 Units/hr (11/12/22 0521)     LOS: 1 day     Marcellus Scott, MD,  FACP, FHM, SFHM, [facility], [facility]   [facility] Physician Advisor [place]     To contact the attending provider between 7A-7P or the covering provider during after hours  7P-7A, please log into the web site [email or web address] and access using universal [place] password for that web site. If you do not have the password, please call the hospital operator.  11/12/2022, 10:09 AM

## 2022-11-13 DIAGNOSIS — E559 Vitamin D deficiency, unspecified: Secondary | ICD-10-CM

## 2022-11-13 DIAGNOSIS — R338 Other retention of urine: Secondary | ICD-10-CM

## 2022-11-13 DIAGNOSIS — D62 Acute posthemorrhagic anemia: Secondary | ICD-10-CM | POA: Diagnosis not present

## 2022-11-13 DIAGNOSIS — S72002D Fracture of unspecified part of neck of left femur, subsequent encounter for closed fracture with routine healing: Secondary | ICD-10-CM | POA: Diagnosis not present

## 2022-11-13 LAB — CBC
HCT: 33 % — ABNORMAL LOW (ref 39.0–52.0)
Hemoglobin: 11 g/dL — ABNORMAL LOW (ref 13.0–17.0)
MCH: 31.3 pg (ref 26.0–34.0)
MCHC: 33.3 g/dL (ref 30.0–36.0)
MCV: 93.8 fL (ref 80.0–100.0)
Platelets: 151 10*3/uL (ref 150–400)
RBC: 3.52 MIL/uL — ABNORMAL LOW (ref 4.22–5.81)
RDW: 13.9 % (ref 11.5–15.5)
WBC: 9.9 10*3/uL (ref 4.0–10.5)
nRBC: 0 % (ref 0.0–0.2)

## 2022-11-13 LAB — BASIC METABOLIC PANEL
Anion gap: 9 (ref 5–15)
BUN: 10 mg/dL (ref 8–23)
CO2: 24 mmol/L (ref 22–32)
Calcium: 8.5 mg/dL — ABNORMAL LOW (ref 8.9–10.3)
Chloride: 103 mmol/L (ref 98–111)
Creatinine, Ser: 1.11 mg/dL (ref 0.61–1.24)
GFR, Estimated: >60 mL/min (ref >60)
Glucose, Bld: 202 mg/dL — ABNORMAL HIGH (ref 70–99)
Potassium: 4.2 mmol/L (ref 3.5–5.1)
Sodium: 136 mmol/L (ref 135–145)

## 2022-11-13 LAB — VITAMIN D 25 HYDROXY (VIT D DEFICIENCY, FRACTURES): Vit D, 25-Hydroxy: 15.8 ng/mL — ABNORMAL LOW (ref 30–100)

## 2022-11-13 MED ORDER — APIXABAN 5 MG PO TABS
5.0000 mg | ORAL_TABLET | Freq: Two times a day (BID) | ORAL | Status: DC
  Administered 2022-11-13 – 2022-11-16 (×7): 5 mg via ORAL
  Filled 2022-11-13 (×7): qty 1

## 2022-11-13 MED ORDER — TAMSULOSIN HCL 0.4 MG PO CAPS
0.4000 mg | ORAL_CAPSULE | Freq: Every day | ORAL | Status: DC
  Administered 2022-11-13 – 2022-11-16 (×4): 0.4 mg via ORAL
  Filled 2022-11-13 (×4): qty 1

## 2022-11-13 MED ORDER — VITAMIN D 25 MCG (1000 UNIT) PO TABS
1000.0000 [IU] | ORAL_TABLET | Freq: Every day | ORAL | Status: DC
  Administered 2022-11-13 – 2022-11-15 (×3): 1000 [IU] via ORAL
  Filled 2022-11-13 (×3): qty 1

## 2022-11-13 NOTE — Progress Notes (Signed)
  Inpatient Rehabilitation Admissions Coordinator   Per therapy recommendations patient was screened for CIR candidacy by Ottie Glazier RN MSN. Current payor trends with [employer] are unlikley to approve a CIR/AIR level rehab for this diagnosis. I will not place a Rehab Conuslt at this time. Recommend other rehab venues to be pursued.   Ottie Glazier, RN, MSN Rehab Admissions Coordinator [phone] 11/13/2022 2:49 PM

## 2022-11-13 NOTE — Evaluation (Addendum)
Occupational Therapy Evaluation Patient Details Name: Luis [name] MRN: [id number] DOB: 1935-05-10 Today's Date: 11/13/2022   History of Present Illness Pt is 88 year old presented to [facility] on  11/11/22 for lt hip fx and underwent ORIF with IM nail on 11/12/22. PMH - CAD, MI, afib. Prostrate CA.   Clinical Impression   PTA pt lives at home independently with his wife and is very active, still driving and working out at [employer]. Pt demonstrates a significant functional decline, requiring Max A with stand step transfers and LB ADL tasks @ RW level. Pt is very motivated to participate in rehab with goal to return home. AIR coordinator notes that pt's ins trends not to pay for AIR for his dx therefore Patient will benefit from continued inpatient follow up therapy, <3 hours/day. Acute OT to follow.      Recommendations for follow up therapy are one component of a multi-disciplinary discharge planning process, led by the attending physician.  Recommendations may be updated based on patient status, additional functional criteria and insurance authorization.   Assistance Recommended at Discharge Frequent or constant Supervision/Assistance  Patient can return home with the following Two people to help with walking and/or transfers;Two people to help with bathing/dressing/bathroom;Assist for transportation;Help with stairs or ramp for entrance    Functional Status Assessment  Patient has had a recent decline in their functional status and demonstrates the ability to make significant improvements in function in a reasonable and predictable amount of time.  Equipment Recommendations  BSC/3in1;Wheelchair (measurements OT);Wheelchair cushion (measurements OT)    Recommendations for Other Services       Precautions / Restrictions Precautions Precautions: Fall Restrictions Weight Bearing Restrictions: Yes LLE Weight Bearing: Weight bearing as tolerated      Mobility Bed Mobility Overal bed  mobility: Needs Assistance Bed Mobility: Supine to Sit, Sit to Supine     Supine to sit: Min assist Sit to supine: Mod assist   General bed mobility comments: Assist to bring LLE off of bed, elevate trunk into sitting and bring hips to EOB    Transfers Overall transfer level: Needs assistance Equipment used: Rolling walker (2 wheels) Transfers: Sit to/from Stand, Bed to chair/wheelchair/BSC Sit to Stand: Max assist Stand pivot transfers: Max assist         General transfer comment: Assist to power up into standing with slow rise. Verbal cues for hand placement      Balance Overall balance assessment: Needs assistance Sitting-balance support: No upper extremity supported, Feet supported Sitting balance-Leahy Scale: Good     Standing balance support: Bilateral upper extremity supported, During functional activity, Reliant on assistive device for balance Standing balance-Leahy Scale: Poor Standing balance comment: walker and min assist for static standing                           ADL either performed or assessed with clinical judgement   ADL Overall ADL's : Needs assistance/impaired Eating/Feeding: Modified independent   Grooming: Set up;Sitting   Upper Body Bathing: Set up;Sitting   Lower Body Bathing: Maximal assistance;Sit to/from stand   Upper Body Dressing : Set up;Sitting   Lower Body Dressing: Maximal assistance;Sit to/from stand   Toilet Transfer: BSC/3in1;Rolling walker (2 wheels);Stand-pivot;Maximal assistance   Toileting- Clothing Manipulation and Hygiene: Total assistance  At baseline incontinent of urine since prostrate CA per pt     Functional mobility during ADLs: Rolling walker (2 wheels);Moderate assistance;Cueing for safety General ADL Comments: May  benefit from AE     Vision Baseline Vision/History: 1 Wears glasses; blind L eye       Perception     Praxis      Pertinent Vitals/Pain Pain Assessment Pain Assessment:  0-10 Pain Score: 7  Pain Location: L hip Pain Descriptors / Indicators: Aching, Discomfort Pain Intervention(s): Limited activity within patient's tolerance, Patient requesting pain meds-RN notified, RN gave pain meds during session     Hand Dominance Left   Extremity/Trunk Assessment Upper Extremity Assessment Upper Extremity Assessment: LUE deficits/detail LUE Deficits / Details: L shoulder "soreness". Unable to lift actively - uses RUE to assist with shoulder flexion; will further assess   Lower Extremity Assessment Lower Extremity Assessment: Defer to PT evaluation LLE Deficits / Details: Limited by hip pain   Cervical / Trunk Assessment Cervical / Trunk Assessment: Normal   Communication Communication Communication: No difficulties   Cognition Arousal/Alertness: Awake/alert Behavior During Therapy: WFL for tasks assessed/performed Overall Cognitive Status: Within Functional Limits for tasks assessed                                       General Comments       Exercises     Shoulder Instructions      Home Living Family/patient expects to be discharged to:: Private residence Living Arrangements: Spouse/significant other Available Help at Discharge: Family;Available 24 hours/day Type of Home: House Home Access: Stairs to enter [employer] of Steps: 1   Home Layout: Multi-level;Able to live on main level with bedroom/bathroom (Master bed/bath upstairs) Alternate Level Stairs-Number of Steps: flight   Bathroom Shower/Tub: Tub/shower unit;Door   [employer] Toilet: Standard Bathroom Accessibility: Yes How Accessible: Accessible via walker            Prior Functioning/Environment Prior Level of Function : Independent/Modified Independent;Driving             Mobility Comments: No assistive device ADLs Comments: works out at [employer] Problem List: Decreased strength;Decreased range of motion;Decreased activity  tolerance;Impaired balance (sitting and/or standing);Decreased safety awareness;Decreased knowledge of use of DME or AE;Pain;Impaired UE functional use      OT Treatment/Interventions: Self-care/ADL training;Therapeutic exercise;DME and/or AE instruction;Therapeutic activities;Patient/family education    OT Goals(Current goals can be found in the care plan section) Acute Rehab OT Goals Patient Stated Goal: to get better OT Goal Formulation: With patient Time For Goal Achievement: 11/27/22 Potential to Achieve Goals: Good  OT Frequency: Min 2X/week    Co-evaluation              AM-PAC OT "6 Clicks" Daily Activity     Outcome Measure Help from another person eating meals?: None Help from another person taking care of personal grooming?: A Little Help from another person toileting, which includes using toliet, bedpan, or urinal?: Total Help from another person bathing (including washing, rinsing, drying)?: A Lot Help from another person to put on and taking off regular upper body clothing?: A Little Help from another person to put on and taking off regular lower body clothing?: A Lot 6 Click Score: 15   End of Session Equipment Utilized During Treatment: Gait belt;Rolling walker (2 wheels) Nurse Communication: Mobility status  Activity Tolerance: Patient tolerated treatment well Patient left: in bed;with call bell/phone within reach;with bed alarm set;with family/visitor present  OT Visit Diagnosis: Unsteadiness on feet (R26.81);Other abnormalities of gait and  mobility (R26.89);Muscle weakness (generalized) (M62.81);History of falling (Z91.81);Pain Pain - Right/Left: Left Pain - part of body: Hip                Time: 9811-9147 OT Time Calculation (min): 23 min Charges:  OT General Charges $OT Visit: 1 Visit OT Evaluation $OT Eval Moderate Complexity: 1 Mod OT Treatments $Self Care/Home Management : 8-22 mins  Luisa Dago, OT/L   Acute OT Clinical Specialist Acute  Rehabilitation Services Pager [phone] Office [phone]   [facility] 11/13/2022, 5:57 PM

## 2022-11-13 NOTE — Anesthesia Postprocedure Evaluation (Signed)
Anesthesia Post Note  Patient: Luis [name]  Procedure(s) Performed: INTRAMEDULLARY (IM) NAIL HIP FRACTURE (Left)     Patient location during evaluation: PACU Anesthesia Type: General Level of consciousness: awake and alert Pain management: pain level controlled Vital Signs Assessment: post-procedure vital signs reviewed and stable Respiratory status: spontaneous breathing, nonlabored ventilation, respiratory function stable and patient connected to nasal cannula oxygen Cardiovascular status: blood pressure returned to baseline and stable Postop Assessment: no apparent nausea or vomiting Anesthetic complications: no   No notable events documented.  Last Vitals:  Vitals:   11/13/22 0013 11/13/22 0445  BP: 118/60 (!) 140/76  Pulse: 80 93  Resp: 19 20  Temp:  36.8 C  SpO2: 96% 98%    Last Pain:  Vitals:   11/13/22 0513  TempSrc:   PainSc: Asleep                 Nelle Don Ma Munoz

## 2022-11-13 NOTE — Evaluation (Signed)
Clinical/Bedside Swallow Evaluation Patient Details  Name: Luis [name] MRN: [id number] Date of Birth: [birth year]  Today's Date: 11/13/2022 Time: SLP Start Time (ACUTE ONLY): 0900 SLP Stop Time (ACUTE ONLY): 0915 SLP Time Calculation (min) (ACUTE ONLY): 15 min  Past Medical History:  Past Medical History:  Diagnosis Date   Anal abscess    CAD (coronary artery disease)    a. BMS-RCA, BMS-Cx, PTCA of OM1 in 2001. b. overlapping DES in RCA and PTCA to small distal Cx 2013. c. Cath 2016 -> stable CAD, suspect dyspnea unrelated to coronary disease.   COLONIC POLYPS, HX OF    Dr Kinnie Scales   DJD (degenerative joint disease)    Essential hypertension    Hyperlipidemia    Myocardial infarction (HCC) 08/1999   Persistent atrial fibrillation (HCC)    a. dx 2016.   RBBB    TIA (transient ischemic attack)    Past Surgical History:  Past Surgical History:  Procedure Laterality Date   CARDIAC CATHETERIZATION  ~ 2002   CARDIAC CATHETERIZATION N/A 03/02/2015   Procedure: Left Heart Cath and Coronary Angiography;  Surgeon: Tonny Bollman, MD;  Location: [facility] INVASIVE CV LAB;  Service: Cardiovascular;  Laterality: N/A;   CATARACT EXTRACTION W/ INTRAOCULAR LENS  IMPLANT, BILATERAL  ~ 2007   bilaterally   CORONARY ANGIOPLASTY  07/30/11   CORONARY ANGIOPLASTY WITH STENT PLACEMENT  2001; 07/30/11;   "2+3"   INGUINAL HERNIA REPAIR  2005   left   LEFT HEART CATHETERIZATION WITH CORONARY ANGIOGRAM N/A 07/30/2011   Procedure: LEFT HEART CATHETERIZATION WITH CORONARY ANGIOGRAM;  Surgeon: Kathleene Hazel, MD;  Location: [facility] CATH LAB;  Service: Cardiovascular;  Laterality: N/A;   PERCUTANEOUS CORONARY STENT INTERVENTION (PCI-S)  07/30/2011   Procedure: PERCUTANEOUS CORONARY STENT INTERVENTION (PCI-S);  Surgeon: Kathleene Hazel, MD;  Location: [facility] CATH LAB;  Service: Cardiovascular;;   RETINAL DETACHMENT SURGERY  05/2008; 06/2008; 07/2008   left; "all 3 surgeries have failed"   HPI:  Luis [name] is a 88 y.o. male presented with mechanical fall and left hip fracture s/p intramedullary nailing of the L hip on 6/2. Pt with medical history significant of CAD/MI status post stenting and a-fib. CXR on admission, "no active disease."   Assessment / Plan / Recommendation  Clinical Impression  Pt demonstrated a functional oral swallow. Pharyngeal swallow appeared [facility] per clinical assessment. Recommend continuation of a regular diet with thin liquids with standard aspiration precautions. SLP to sign off as pt has no acute SLP needs.      Aspiration Risk  Mild aspiration risk    Diet Recommendation Regular;Thin liquid   Liquid Administration via: Cup;Straw Medication Administration:  (as tolerated) Supervision: Patient able to self feed Compensations: Small sips/bites;Slow rate Postural Changes: Seated upright at 90 degrees;Remain upright for at least 30 minutes after po intake    Other  Recommendations Oral Care Recommendations: Oral care BID    Recommendations for follow up therapy are one component of a multi-disciplinary discharge planning process, led by the attending physician.  Recommendations may be updated based on patient status, additional functional criteria and insurance authorization.  Follow up Recommendations No SLP follow up         Functional Status Assessment Patient has not had a recent decline in their functional status         Prognosis Prognosis for improved oropharyngeal function: Good      Swallow Study   General Date of Onset: 11/11/22 HPI: Luis [name] is a  88 y.o. male presented with mechanical fall and left hip fracture s/p intramedullary nailing of the L hip on 6/2. Pt with medical history significant of CAD/MI status post stenting and a-fib. Type of Study: Bedside Swallow Evaluation Previous Swallow Assessment: none Diet Prior to this Study: Regular;Thin liquids (Level 0) Temperature Spikes Noted: No Respiratory Status: Room  air History of Recent Intubation: Yes Total duration of intubation (days):  (for surgery) Behavior/Cognition: Alert Oral Cavity Assessment: Within Functional Limits Oral Care Completed by SLP: Recent completion by staff Oral Cavity - Dentition:  (+dental implants, lower partial) Vision: Functional for self-feeding Self-Feeding Abilities: Able to feed self Patient Positioning: Upright in bed Baseline Vocal Quality: Normal Volitional Cough: Strong    Oral/Motor/Sensory Function Overall Oral Motor/Sensory Function: Within functional limits   Ice Chips Ice chips: Not tested   Thin Liquid Thin Liquid: Within functional limits Presentation: Straw    Nectar Thick Nectar Thick Liquid: Not tested   Honey Thick Honey Thick Liquid: Not tested   Puree Puree: Within functional limits Presentation: Self Fed   Solid     Solid: Within functional limits Presentation: Self Fed     Clyde Canterbury, M.S., CCC-SLP Speech-Language Pathologist Secure Chat Preferred  O: [phone]  Alessandra Bevels Kayd Launer 11/13/2022,10:01 AM

## 2022-11-13 NOTE — Progress Notes (Signed)
ANTICOAGULATION CONSULT NOTE  Pharmacy Consult for Apixaban Indication: atrial fibrillation  No Known Allergies  Patient Measurements: Height: 6' (182.9 cm) Weight: 83 kg (182 lb 15.7 oz) IBW/kg (Calculated) : 77.6 Heparin Dosing Weight: 83 kg  Vital Signs: Temp: 98.2 F (36.8 C) (06/04 0445) Temp Source: Oral (06/04 0445) BP: 140/76 (06/04 0445) Pulse Rate: 93 (06/04 0445)  Labs: Recent Labs    11/11/22 1641 11/12/22 1017 11/13/22 0039  HGB 14.5  --  11.0*  HCT 42.8  --  33.0*  PLT 188  --  151  APTT  --  97*  --   HEPARINUNFRC  --  1.08*  --   CREATININE 1.25*  --  1.11    Estimated Creatinine Clearance: 53.4 mL/min (by C-G formula based on SCr of 1.11 mg/dL).   Assessment: 45 yoM with PMH of afib (eliquis) who is admitted after fall and left sided hip pain. Last dose of Elqiuis 6/02 AM. Pharmacy consulted to dose heparin in anticipation of OR procedure.   OK to restart PTA direct oral anticoagulant (DOAC) on POD1 pending AM hemoglobin being stable and =/> 8.5 and not requiring blood transfusion. Hgb this AM 11 with no issues of bleeding reported.  Goal of Therapy:  Heparin level 0.3-0.7 units/ml aPTT 66-102 seconds Monitor platelets by anticoagulation protocol: Yes   Plan:  Resume PTA apixaban 5mg  PO twice daily Continue to monitor H&H and platelets  Thank you for allowing pharmacy to be a part of this patient's care.  Thelma Barge, PharmD Clinical Pharmacist

## 2022-11-13 NOTE — TOC CM/SW Note (Signed)
Transition of Care [facility]) - Inpatient Brief Assessment   Patient Details  Name: Luis [name] MRN: [id number] Date of Birth: 10/26/1934  [facility]) CM/SW Contact:    Carley Hammed, LCSW Phone Number: 11/13/2022, 11:40 AM   Clinical Narrative: CSW acknowledges consult for disposition and medication needs. Pt with recent surgery, PT to see when appropriate. TOC will continue to follow to assist with discharge needs.    Transition of Care Asessment: Insurance and Status: Insurance coverage has been reviewed Patient has primary care physician: Yes Home environment has been reviewed: Home with spouse Prior level of function:: Mod I Prior/Current Home Services: No current home services Social Determinants of Health Reivew: SDOH reviewed no interventions necessary Readmission risk has been reviewed: Yes Transition of care needs: transition of care needs identified, TOC will continue to follow

## 2022-11-13 NOTE — Progress Notes (Signed)
Orthopaedic Trauma Service Progress Note  Patient ID: Luis [name] MRN: [id number] DOB/AGE: 86-Feb-1936 86 y.o.  Subjective:  Doing well Reports expected level of post op pain Ext warm   Ultimately wants to dc home   No assistive devices at baseline but does admit his balance is off  No other complaints  Foley in place for urinary retention   ROS As above   Objective:   VITALS:   Vitals:   11/12/22 2108 11/13/22 0013 11/13/22 0445 11/13/22 0851  BP: (!) 156/80 118/60 (!) 140/76 131/71  Pulse: 87 80 93 73  Resp: 18 19 20 17   Temp: 98.1 F (36.7 C)  98.2 F (36.8 C) 98.2 F (36.8 C)  TempSrc: Oral  Oral Oral  SpO2: 98% 96% 98% 97%  Weight:      Height:        Estimated body mass index is 24.82 kg/m as calculated from the following:   Height as of this encounter: 6' (1.829 m).   Weight as of this encounter: 83 kg.   Intake/Output      06/03 0701 06/04 0700 06/04 0701 06/05 0700   P.O. 120    I.V. (mL/kg) 1000 (12)    IV Piggyback 200    Total Intake(mL/kg) 1320 (15.9)    Urine (mL/kg/hr) 1300 (0.7)    Blood 250    Total Output 1550    Net -230           LABS  Results for orders placed or performed during the hospital encounter of 11/11/22 (from the past 24 hour(s))  CBC     Status: Abnormal   Collection Time: 11/13/22 12:39 AM  Result Value Ref Range   WBC 9.9 4.0 - 10.5 K/uL   RBC 3.52 (L) 4.22 - 5.81 MIL/uL   Hemoglobin 11.0 (L) 13.0 - 17.0 g/dL   HCT 30.8 (L) 65.7 - 84.6 %   MCV 93.8 80.0 - 100.0 fL   MCH 31.3 26.0 - 34.0 pg   MCHC 33.3 30.0 - 36.0 g/dL   RDW 96.2 95.2 - 84.1 %   Platelets 151 150 - 400 K/uL   nRBC 0.0 0.0 - 0.2 %  Basic metabolic panel     Status: Abnormal   Collection Time: 11/13/22 12:39 AM  Result Value Ref Range   Sodium 136 135 - 145 mmol/L   Potassium 4.2 3.5 - 5.1 mmol/L   Chloride 103 98 - 111 mmol/L   CO2 24 22 - 32 mmol/L    Glucose, Bld 202 (H) 70 - 99 mg/dL   BUN 10 8 - 23 mg/dL   Creatinine, Ser 3.24 0.61 - 1.24 mg/dL   Calcium 8.5 (L) 8.9 - 10.3 mg/dL   GFR, Estimated >40 >10 mL/min   Anion gap 9 5 - 15  VITAMIN D 25 Hydroxy (Vit-D Deficiency, Fractures)     Status: Abnormal   Collection Time: 11/13/22 12:39 AM  Result Value Ref Range   Vit D, 25-Hydroxy 15.80 (L) 30 - 100 ng/mL     PHYSICAL EXAM:   Gen: resting comfortably in bed, NAD, wife at bedside  Lungs: unlabored Ext:       Left Lower Extremity   Dressings clean, dry and intact  Ext warm   + dp pulse  No DCT  No pitting  edema  Swelling minimal   EHL, FHL, lesser toe motor intact  Ankle flexion, extension, inversion and eversion intact  + Quad set   Good perfusion distally     Assessment/Plan: 1 Day Post-Op   Principal Problem:   Hip fracture (HCC) Active Problems:   Coronary atherosclerosis   Atrial fibrillation (HCC)   Closed displaced fracture of left femoral neck (HCC)   Anti-infectives (From admission, onward)    Start     Dose/Rate Route Frequency Ordered Stop   11/12/22 2200  ceFAZolin (ANCEF) IVPB 2g/100 mL premix        2 g 200 mL/hr over 30 Minutes Intravenous Every 6 hours 11/12/22 2107 11/13/22 0517   11/12/22 1145  ceFAZolin (ANCEF) IVPB 2g/100 mL premix        2 g 200 mL/hr over 30 Minutes Intravenous On call to O.R. 11/12/22 1048 11/12/22 1816     .  POD/HD#: 1  88 y/o male s/p fall with L hip fracture   -Left hip fracture s/p IMN  Weightbearing WBAT with walker   ROM/Activity   ROM L hip and knee as tolerated   Activity as tolerated    Wound care   Dressing changes starting on 11/15/2022   PT/OT evals  Ice prn L hip     Pt is very motivated. Think he could really benefit from CIR. Will see how he does with therapies   - Pain management:  Multimodal   Minimize narcotics    Scheduled tylenol    Low dose Oxy IR for breakthrough pain   - ABL anemia/Hemodynamics  Stable   Monitor  CBC   - Medical issues   Per primary   - DVT/PE prophylaxis:  Resume home eliquis   - ID:   Periop abx  - Metabolic Bone Disease:  Vitamin d deficiency    Supplement  Fracture is a fragility fracture    Outpt work up    Dexa    Referral to osteoporosis clinic   Appreciate RD recs   - Activity:  As above  - FEN/GI prophylaxis/Foley/Lines:  Foley for urinary retention    Flomax started    Agree with leaving foley in for another 24 hours   - Impediments to fracture healing:  Vitamin d deficiency   Fragility fracture   Age    - Dispo:  Therapy evals   Possible CIR candidate      Mearl Latin, PA-C [phone] (C) 11/13/2022, 12:14 PM  [facility] [address] [place] [address] [phone] [phone] (F)    After 5pm and on the weekends please log on to Amion, go to orthopaedics and the look under the [facility] Call for the provider(s) on call. You can also call our office at [phone] and then follow the prompts to be connected to the call team.  Patient ID: Luis [name], male   DOB: 1935/02/21, 88 y.o.   MRN: [id number]

## 2022-11-13 NOTE — Progress Notes (Signed)
PROGRESS NOTE   Luis [name]  WUJ:[id number]    DOB: [birth year]    DOA: 11/11/2022  PCP: Ardith Dark, MD   I have briefly reviewed patients previous medical records in [facility].  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 Clifton James, HTN, HLD, RBBB, TIA, mild carotid disease, persistent A-fib on Eliquis, sustained a mechanical fall at home and admitted for left hip fracture and pain.  Orthopedics consulted.  S/p left hip IM nailing on femoral block by Dr. Carola Frost on 11/12/2022.   Assessment & Plan:  Principal Problem:   Hip fracture [facility]) Active Problems:   Atrial fibrillation (HCC)   Coronary atherosclerosis   Closed displaced fracture of left femoral neck (HCC)   Left intertrochanteric femoral fracture, S/p left hip IM nailing on femoral block by Dr. Carola Frost on 11/12/2022: - Sustained s/p mechanical fall at home -Orthopedics was consulted and underwent above surgery -Preop was on IV heparin bridge and has already been switched back to home dose of Eliquis. - Multimodality pain control.  Delirium precautions. - Awaiting therapies evaluation and may need SNF for STR. - Since patient had acute urinary retention on 6/3, Foley catheter was inserted, starting Flomax today, leave Foley catheter in for 1 more day and attempt voiding trial on 6/5.  Acute urinary retention: - Had 700 mL on bladder scan on 6/3.  Foley catheter was placed. - Started Flomax 0.4 mg today.  Continue Foley catheter for 1 more day and attempt voiding trial in AM. - Follows with Dr. Bjorn Pippin, [facility], recommend outpatient follow-up.  Hopefully will not need catheter at time of discharge.  Acute posthemorrhagic anemia: EBL 200 mL.  Hemoglobin has dropped  from 14.5 preop to 11 g per DL, likely multifactorial related to fracture site blood loss, surgical blood loss and hemodilution from IV fluids.  Follow CBCs in AM and transfuse if hemoglobin 7 g or less.  Persistent A-fib: - Not on rate control medications.  Controlled ventricular rate on telemetry. - Eliquis was briefly held, patient was placed on heparin bridge prior to surgery.  Eliquis has been resumed postsurgery.  CAD s/p remote MI, and PCI - Last cath in 2016 showed nonobstructive disease and stable CAD. - No anginal symptoms. - Continue atorvastatin, Zetia.  Not on aspirin due to Eliquis. - Follows with outpatient cardiology/Dr. Clifton James (updated Dr. Clifton James of patient's admission on 6/3 by secure chat).  Essential hypertension: - Controlled on no meds PTA.  Monitor.  Hyperlipidemia: - Continue atorvastatin and Zetia.  Anxiety and depression: Continue home dose of bupropion, Prozac.  Stage II CKD Creatinine is dropped further to 1.11 today making it stage II CKD.  Trend BMP periodically.  Vitamin D deficiency: 25-hydroxy vitamin D levels 15.8 (normal is 30-100 ng/mL).  Started vitamin D 1000 units daily.  Hyperglycemia/prediabetes: Most recent A1c 5.8 on 12/15.  Repeating A1c.  Monitor CBGs and consider SSI if needed.  Body mass index is 24.82 kg/m.   ACP Documents: None. DVT prophylaxis: SCDs Start: 11/12/22 2108 Eliquis anticoagulation   Code Status: Full Code:  Family Communication: Spouse at bedside. Disposition:  Inpatient appropriate.  POD #1, awaiting therapies evaluation, adequate pain control and may need SNF for  STR.     Consultants:   Orthopedics  Procedures:   Left hip IM nailing 6/3  Antimicrobials:      Subjective:  Seen this morning.  Spouse at bedside.  Reports no postop left hip pain.  Advised him that once the femoral block wears off, he may start having more pain and can be controlled on multimodality pain control.  Prefers to leave  Foley catheter for 1 more day as suggested by [place].  No other complaints reported.  Objective:   Vitals:   11/12/22 2108 11/13/22 0013 11/13/22 0445 11/13/22 0851  BP: (!) 156/80 118/60 (!) 140/76 131/71  Pulse: 87 80 93 73  Resp: 18 19 20 17   Temp: 98.1 F (36.7 C)  98.2 F (36.8 C) 98.2 F (36.8 C)  TempSrc: Oral  Oral Oral  SpO2: 98% 96% 98% 97%  Weight:      Height:        General exam: Elderly male, moderately built and nourished lying comfortably supine in bed without distress.  Oral mucosa moist. Respiratory system: Clear to auscultation.  No increased work of breathing. Cardiovascular system: S1 & S2 heard, irregularly regular and controlled ventricular rate. No JVD, murmurs, rubs, gallops or clicks. No pedal edema.  Telemetry personally reviewed: A-fib with controlled ventricular rate.  Discontinued telemetry. Gastrointestinal system: Abdomen is nondistended, soft and nontender. No organomegaly or masses felt. Normal bowel sounds heard. Central nervous system: Alert and oriented. No focal neurological deficits. Extremities: Symmetric 5 x 5 power except left lower extremity which is limited by postop pain.  Left hip postop dressing clean and dry.  Has a cold pack on it.  Neurovascular bundle intact. Skin: No rashes, lesions or ulcers Psychiatry: Judgement and insight appear normal. Mood & affect appropriate.     Data Reviewed:   I have personally reviewed following labs and imaging studies   CBC: Recent Labs  Lab 11/11/22 1641 11/13/22 0039  WBC 7.1 9.9  HGB 14.5 11.0*  HCT 42.8 33.0*  MCV 93.7 93.8  PLT 188 151    Basic Metabolic Panel: Recent Labs  Lab 11/11/22 1641 11/13/22 0039  NA 136 136  K 4.2 4.2  CL 105 103  CO2 22 24  GLUCOSE 144* 202*  BUN 13 10  CREATININE 1.25* 1.11  CALCIUM 8.8* 8.5*    Liver Function Tests: No results for input(s): "AST", "ALT", "ALKPHOS", "BILITOT", "PROT", "ALBUMIN" in the last 168 hours.  CBG: No results for  input(s): "GLUCAP" in the last 168 hours.  Microbiology Studies:   Recent Results (from the past 240 hour(s))  Surgical pcr screen     Status: None   Collection Time: 11/11/22 11:31 PM   Specimen: Nasal Mucosa; Nasal Swab  Result Value Ref Range Status   MRSA, PCR NEGATIVE NEGATIVE Final   Staphylococcus aureus NEGATIVE NEGATIVE Final    Comment: (NOTE) The Xpert SA Assay (FDA approved for NASAL specimens in patients 45 years of age and older), is one component of a comprehensive surveillance program. It is not intended to diagnose infection nor to guide or monitor treatment. Performed at [facility], [address] [place], [place] [address]     Radiology Studies:  DG FEMUR PORT MIN 2 VIEWS LEFT  Result Date: 11/12/2022 CLINICAL DATA:  Status post ORIF left femoral fracture EXAM: LEFT FEMUR PORTABLE 2 VIEWS COMPARISON:  Intraoperative films from earlier in the same day. FINDINGS: Medullary rod is now seen in the left femur with fixation screw.  Slight displacement of the fracture fragments is noted. No new focal abnormality is seen. IMPRESSION: Status post ORIF proximal left femoral fracture Electronically Signed   By: Alcide Clever M.D.   On: 11/12/2022 22:42   DG FEMUR MIN 2 VIEWS LEFT  Result Date: 11/12/2022 CLINICAL DATA:  Left intratrochanteric femoral fracture EXAM: LEFT FEMUR 2 VIEWS COMPARISON:  None Available. FLUOROSCOPY TIME:  Radiation Exposure Index (as provided by the fluoroscopic device): 15.66 mGy If the device does not provide the exposure index: Fluoroscopy Time:  1 minute 1 second Number of Acquired Images:  8 FINDINGS: Initial images again demonstrate the intratrochanteric fracture. Medullary rod with fixation screw traversing the femoral neck is seen. Fracture fragments are in near anatomic alignment. IMPRESSION: ORIF of left femoral fracture. Electronically Signed   By: Alcide Clever M.D.   On: 11/12/2022 22:41   DG C-Arm 1-60 Min-No Report  Result Date:  11/12/2022 Fluoroscopy was utilized by the requesting physician.  No radiographic interpretation.   DG C-Arm 1-60 Min-No Report  Result Date: 11/12/2022 Fluoroscopy was utilized by the requesting physician.  No radiographic interpretation.   DG Knee Left Port  Result Date: 11/12/2022 CLINICAL DATA:  Hip fracture. EXAM: PORTABLE LEFT KNEE - 1-2 VIEW COMPARISON:  Left hip x-ray 11/11/2022 FINDINGS: There is a suprapatellar joint effusion. The bones are osteopenic. There is no acute fracture or dislocation identified. There is an exostosis from the lateral aspect of the distal femoral metadiaphysis measuring 5.2 by 0.9 by 1.6 cm. Peripheral vascular calcifications are present. IMPRESSION: 1. No acute fracture or dislocation. 2. Suprapatellar joint effusion. 3. Exostosis from the lateral aspect of the distal femoral metadiaphysis. If there is point tenderness at this level, recommend further evaluation with MRI. Electronically Signed   By: Darliss Cheney M.D.   On: 11/12/2022 15:20   ECHOCARDIOGRAM COMPLETE  Result Date: 11/12/2022    ECHOCARDIOGRAM REPORT   Patient Name:   MECHEL POSEN Date of Exam: 11/12/2022 Medical Rec #:  [id number]        Height:       72.0 in Accession #:    [id number]       Weight:       183.0 lb Date of Birth:  1935-03-11        BSA:          2.052 m Patient Age:    87 years         BP:           128/73 mmHg Patient Gender: M                HR:           99 bpm. Exam Location:  Inpatient Procedure: 2D Echo, Color Doppler and Cardiac Doppler Indications:    CAD Native Vessel  History:        Patient has prior history of Echocardiogram examinations, most                 recent 11/30/2014. CAD and Previous Myocardial Infarction, TIA,                 Arrythmias:Atrial Fibrillation and RBBB; Risk                 Factors:Hypertension and Dyslipidemia.  Sonographer:    Milda Smart Referring Phys: [id number] PING T ZHANG  Sonographer Comments: Suboptimal parasternal window. Image acquisition  challenging due to respiratory motion and Image acquisition challenging due to patient body habitus. IMPRESSIONS  1. Left ventricular ejection fraction, by estimation, is 60 to 65%. The left ventricle has normal function. The left ventricle has no regional wall motion abnormalities. There is mild concentric left ventricular hypertrophy. Left ventricular diastolic function could not be evaluated.  2. Right ventricular systolic function is normal. The right ventricular size is normal.  3. Left atrial size was mild to moderately dilated.  4. The mitral valve is normal in structure. Trivial mitral valve regurgitation. No evidence of mitral stenosis.  5. The aortic valve is tricuspid. There is moderate calcification of the aortic valve. Aortic valve regurgitation is mild. Aortic valve sclerosis/calcification is present, without any evidence of aortic stenosis. Aortic regurgitation PHT measures 691 msec.  6. Aortic dilatation noted. There is borderline dilatation of the ascending aorta, measuring 38 mm. FINDINGS  Left Ventricle: Left ventricular ejection fraction, by estimation, is 60 to 65%. The left ventricle has normal function. The left ventricle has no regional wall motion abnormalities. The left ventricular internal cavity size was normal in size. There is  mild concentric left ventricular hypertrophy. Left ventricular diastolic function could not be evaluated due to atrial fibrillation. Left ventricular diastolic function could not be evaluated. Right Ventricle: The right ventricular size is normal. No increase in right ventricular wall thickness. Right ventricular systolic function is normal. Left Atrium: Left atrial size was mild to moderately dilated. Right Atrium: Right atrial size was normal in size. Pericardium: There is no evidence of pericardial effusion. Mitral Valve: The mitral valve is normal in structure. Trivial mitral valve regurgitation. No evidence of mitral valve stenosis. Tricuspid Valve: The  tricuspid valve is normal in structure. Tricuspid valve regurgitation is trivial. No evidence of tricuspid stenosis. Aortic Valve: The aortic valve is tricuspid. There is moderate calcification of the aortic valve. Aortic valve regurgitation is mild. Aortic regurgitation PHT measures 691 msec. Aortic valve sclerosis/calcification is present, without any evidence of aortic stenosis. Pulmonic Valve: The pulmonic valve was normal in structure. Pulmonic valve regurgitation is trivial. No evidence of pulmonic stenosis. Aorta: Aortic dilatation noted. There is borderline dilatation of the ascending aorta, measuring 38 mm. Venous: The inferior vena cava was not well visualized. IAS/Shunts: No atrial level shunt detected by color flow Doppler.  LEFT VENTRICLE PLAX 2D LVIDd:         3.70 cm     Diastology LVIDs:         2.80 cm     LV e' medial:    8.05 cm/s LV PW:         1.20 cm     LV E/e' medial:  11.8 LV IVS:        1.40 cm     LV e' lateral:   13.30 cm/s LVOT diam:     2.10 cm     LV E/e' lateral: 7.1 LV SV:         73 LV SV Index:   35 LVOT Area:     3.46 cm  LV Volumes (MOD) LV vol d, MOD A2C: 65.3 ml LV vol d, MOD A4C: 76.3 ml LV vol s, MOD A2C: 29.8 ml LV vol s, MOD A4C: 27.7 ml LV SV MOD A2C:     35.5 ml LV SV MOD A4C:     76.3 ml LV SV MOD BP:      41.7 ml RIGHT VENTRICLE RV Basal diam:  3.30 cm RV S prime:     12.90 cm/s TAPSE (M-mode): 1.5 cm LEFT ATRIUM  Index        RIGHT ATRIUM           Index LA diam:        5.00 cm 2.44 cm/m   RA Area:     17.80 cm LA Vol (A2C):   51.2 ml 24.96 ml/m  RA Volume:   42.50 ml  20.72 ml/m LA Vol (A4C):   56.4 ml 27.49 ml/m LA Biplane Vol: 56.0 ml 27.30 ml/m  AORTIC VALVE LVOT Vmax:   104.00 cm/s LVOT Vmean:  75.100 cm/s LVOT VTI:    0.210 m AI PHT:      691 msec  AORTA Ao Root diam: 3.70 cm Ao Asc diam:  3.80 cm MITRAL VALVE MV Area (PHT): 2.73 cm    SHUNTS MV Decel Time: 278 msec    Systemic VTI:  0.21 m MR Peak grad: 20.1 mmHg    Systemic Diam: 2.10 cm MR  Vmax:      224.00 cm/s MV E velocity: 95.00 cm/s Arvilla Meres MD Electronically signed by Arvilla Meres MD Signature Date/Time: 11/12/2022/2:40:13 PM    Final    DG Hip Unilat W or Wo Pelvis 2-3 Views Left  Result Date: 11/11/2022 CLINICAL DATA:  Trauma, fall EXAM: DG HIP (WITH OR WITHOUT PELVIS) 2-3V LEFT COMPARISON:  None Available. FINDINGS: Acute comminuted intertrochanteric fracture is seen in the neck of left femur extending into the proximal shaft. There is a 19 mm offset in alignment of lateral cortical margins in the fracture. There is no dislocation. There is mild narrowing of lateral aspect of left hip joint space. Arterial calcifications are seen in soft tissues. IMPRESSION: Comminuted displaced recent fracture is seen in the intertrochanteric portion of left femur extending into the proximal shaft. Electronically Signed   By: Ernie Avena M.D.   On: 11/11/2022 17:52   DG Shoulder Left  Result Date: 11/11/2022 CLINICAL DATA:  Trauma, fall EXAM: LEFT SHOULDER - 2+ VIEW COMPARISON:  None FINDINGS: No fracture or dislocation is seen. Small bony spurs seen in [facility] joint. Calcifications are seen in thoracic aorta. IMPRESSION: No recent fracture or dislocation is seen in left shoulder. Degenerative changes are noted in left AC joint. Electronically Signed   By: Ernie Avena M.D.   On: 11/11/2022 17:50   DG Chest Portable 1 View  Result Date: 11/11/2022 CLINICAL DATA:  Fall EXAM: PORTABLE CHEST 1 VIEW COMPARISON:  Chest x-ray 03/04/2017 FINDINGS: The heart size and mediastinal contours are within normal limits. There are atherosclerotic calcifications of the aorta. Both lungs are clear. The visualized skeletal structures are unremarkable. IMPRESSION: No active disease. Electronically Signed   By: Darliss Cheney M.D.   On: 11/11/2022 17:49    Scheduled Meds:    acetaminophen  650 mg Oral Q6H   apixaban  5 mg Oral BID   atorvastatin  80 mg Oral Daily   buPROPion  150 mg Oral Daily    docusate sodium  100 mg Oral BID   ezetimibe  10 mg Oral Daily   FLUoxetine  20 mg Oral Daily   fluticasone  1 spray Each Nare Daily   magnesium oxide  400 mg Oral Daily   multivitamin with minerals  1 tablet Oral Daily   tamsulosin  0.4 mg Oral Daily    Continuous Infusions:       LOS: 2 days     Marcellus Scott, MD,  FACP, FHM, SFHM, [facility], [facility]   [facility]  To contact the attending provider between 7A-7P or the covering provider during after hours 7P-7A, please log into the web site [email or web address] and access using universal [place] password for that web site. If you do not have the password, please call the hospital operator.  11/13/2022, 11:38 AM

## 2022-11-13 NOTE — Evaluation (Signed)
Physical Therapy Evaluation Patient Details Name: Luis [name] MRN: [id number] DOB: 04-25-1935 Today's Date: 11/13/2022  History of Present Illness  Pt is 88 year old presented to [facility] on  11/11/22 for lt hip fx and underwent ORIF with IM nail on 11/12/22. PMH - CAD, MI, afib  Clinical Impression  Pt admitted with above diagnosis and presents to PT with functional limitations due to deficits listed below (See PT problem list). Pt needs skilled PT to maximize independence and safety. Pt was independent prior to hip fx and motivated to return home as independent as possible. Patient will benefit from intensive inpatient follow up therapy, >3 hours/day.           Recommendations for follow up therapy are one component of a multi-disciplinary discharge planning process, led by the attending physician.  Recommendations may be updated based on patient status, additional functional criteria and insurance authorization.  Follow Up Recommendations       Assistance Recommended at Discharge Frequent or constant Supervision/Assistance  Patient can return home with the following  A lot of help with walking and/or transfers;A lot of help with bathing/dressing/bathroom;Assist for transportation;Help with stairs or ramp for entrance    Equipment Recommendations Rolling walker (2 wheels);Wheelchair (measurements PT);Wheelchair cushion (measurements PT)  Recommendations for Other Services  Rehab consult    Functional Status Assessment Patient has had a recent decline in their functional status and demonstrates the ability to make significant improvements in function in a reasonable and predictable amount of time.     Precautions / Restrictions Precautions Precautions: Fall Restrictions Weight Bearing Restrictions: Yes LLE Weight Bearing: Weight bearing as tolerated      Mobility  Bed Mobility Overal bed mobility: Needs Assistance Bed Mobility: Supine to Sit     Supine to sit: Min assist      General bed mobility comments: Assist to bring LLE off of bed, elevate trunk into sitting and bring hips to EOB    Transfers Overall transfer level: Needs assistance Equipment used: Rolling walker (2 wheels) Transfers: Sit to/from Stand Sit to Stand: Max assist           General transfer comment: Assist to power up into standing with slow rise. Verbal cues for hand placement    Ambulation/Gait Ambulation/Gait assistance: Mod assist Gait Distance (Feet): 3 Feet Assistive device: Rolling walker (2 wheels) Gait Pattern/deviations: Step-through pattern, Decreased stance time - left, Decreased weight shift to left, Trunk flexed Gait velocity: decr Gait velocity interpretation: <1.31 ft/sec, indicative of household ambulator   General Gait Details: Assist for support and balance. Verbal cues for gait sequence. Verbal cues to bear weight on LLE.  Stairs            Wheelchair Mobility    Modified Rankin (Stroke Patients Only)       Balance Overall balance assessment: Needs assistance Sitting-balance support: No upper extremity supported, Feet supported Sitting balance-Leahy Scale: Good     Standing balance support: Bilateral upper extremity supported, During functional activity, Reliant on assistive device for balance Standing balance-Leahy Scale: Poor Standing balance comment: walker and min assist for static standing                             Pertinent Vitals/Pain      Home Living Family/patient expects to be discharged to:: Private residence Living Arrangements: Spouse/significant other Available Help at Discharge: Family;Available 24 hours/day Type of Home: House Home Access: Stairs to enter  Entrance Stairs-Number of Steps: 1 Alternate Level Stairs-Number of Steps: flight Home Layout: Multi-level;Able to live on main level with bedroom/bathroom (Master bed/bath upstairs) Home Equipment: Gilmer Mor - single point      Prior Function Prior  Level of Function : Independent/Modified Independent;Driving             Mobility Comments: No assistive device       Hand Dominance   Dominant Hand: Left    Extremity/Trunk Assessment   Upper Extremity Assessment Upper Extremity Assessment: Defer to OT evaluation    Lower Extremity Assessment Lower Extremity Assessment: Generalized weakness;LLE deficits/detail LLE Deficits / Details: Limited by hip pain       Communication   Communication: No difficulties  Cognition Arousal/Alertness: Awake/alert Behavior During Therapy: WFL for tasks assessed/performed Overall Cognitive Status: Within Functional Limits for tasks assessed                                          General Comments      Exercises Total Joint Exercises Ankle Circles/Pumps: AROM, Both, 10 reps, Supine Quad Sets: Strengthening, 5 reps, Left, Supine Heel Slides: AAROM, Left, 5 reps, Supine   Assessment/Plan    PT Assessment Patient needs continued PT services  PT Problem List Decreased strength;Decreased range of motion;Decreased activity tolerance;Decreased balance;Decreased mobility;Pain       PT Treatment Interventions DME instruction;Gait training;Stair training;Functional mobility training;Therapeutic activities;Therapeutic exercise;Balance training;Patient/family education    PT Goals (Current goals can be found in the Care Plan section)  Acute Rehab PT Goals Patient Stated Goal: return home PT Goal Formulation: With patient Time For Goal Achievement: 11/27/22 Potential to Achieve Goals: Good    Frequency Min 4X/week     Co-evaluation               AM-PAC PT "6 Clicks" Mobility  Outcome Measure Help needed turning from your back to your side while in a flat bed without using bedrails?: A Lot Help needed moving from lying on your back to sitting on the side of a flat bed without using bedrails?: A Little Help needed moving to and from a bed to a chair  (including a wheelchair)?: A Lot Help needed standing up from a chair using your arms (e.g., wheelchair or bedside chair)?: A Lot Help needed to walk in hospital room?: Total Help needed climbing 3-5 steps with a railing? : Total 6 Click Score: 11    End of Session Equipment Utilized During Treatment: Gait belt Activity Tolerance: Patient limited by pain Patient left: in chair;with call bell/phone within reach;with chair alarm set Nurse Communication: Mobility status PT Visit Diagnosis: Other abnormalities of gait and mobility (R26.89);History of falling (Z91.81);Muscle weakness (generalized) (M62.81);Pain Pain - Right/Left: Left Pain - part of body: Hip    Time: 1353-1431 PT Time Calculation (min) (ACUTE ONLY): 38 min   Charges:   PT Evaluation $PT Eval Moderate Complexity: 1 Mod PT Treatments $Therapeutic Activity: 23-37 mins        [facility] Acute Rehabilitation Services Office [phone]   Angelina Ok [facility] 11/13/2022, 2:42 PM

## 2022-11-14 DIAGNOSIS — I2510 Atherosclerotic heart disease of native coronary artery without angina pectoris: Secondary | ICD-10-CM

## 2022-11-14 DIAGNOSIS — I4819 Other persistent atrial fibrillation: Secondary | ICD-10-CM | POA: Diagnosis not present

## 2022-11-14 DIAGNOSIS — S72002D Fracture of unspecified part of neck of left femur, subsequent encounter for closed fracture with routine healing: Secondary | ICD-10-CM | POA: Diagnosis not present

## 2022-11-14 DIAGNOSIS — S72002A Fracture of unspecified part of neck of left femur, initial encounter for closed fracture: Secondary | ICD-10-CM | POA: Diagnosis not present

## 2022-11-14 LAB — BASIC METABOLIC PANEL
Anion gap: 7 (ref 5–15)
BUN: 14 mg/dL (ref 8–23)
CO2: 26 mmol/L (ref 22–32)
Calcium: 8.1 mg/dL — ABNORMAL LOW (ref 8.9–10.3)
Chloride: 101 mmol/L (ref 98–111)
Creatinine, Ser: 0.98 mg/dL (ref 0.61–1.24)
GFR, Estimated: >60 mL/min (ref >60)
Glucose, Bld: 110 mg/dL — ABNORMAL HIGH (ref 70–99)
Potassium: 3.8 mmol/L (ref 3.5–5.1)
Sodium: 134 mmol/L — ABNORMAL LOW (ref 135–145)

## 2022-11-14 LAB — HEMOGLOBIN A1C
Hgb A1c MFr Bld: 5.9 % — ABNORMAL HIGH (ref 4.8–5.6)
Mean Plasma Glucose: 123 mg/dL

## 2022-11-14 LAB — CBC
HCT: 27.4 % — ABNORMAL LOW (ref 39.0–52.0)
Hemoglobin: 9.1 g/dL — ABNORMAL LOW (ref 13.0–17.0)
MCH: 31.4 pg (ref 26.0–34.0)
MCHC: 33.2 g/dL (ref 30.0–36.0)
MCV: 94.5 fL (ref 80.0–100.0)
Platelets: 144 10*3/uL — ABNORMAL LOW (ref 150–400)
RBC: 2.9 MIL/uL — ABNORMAL LOW (ref 4.22–5.81)
RDW: 13.9 % (ref 11.5–15.5)
WBC: 10.7 10*3/uL — ABNORMAL HIGH (ref 4.0–10.5)
nRBC: 0 % (ref 0.0–0.2)

## 2022-11-14 LAB — GLUCOSE, CAPILLARY
Glucose-Capillary: 144 mg/dL — ABNORMAL HIGH (ref 70–99)
Glucose-Capillary: 160 mg/dL — ABNORMAL HIGH (ref 70–99)

## 2022-11-14 MED ORDER — ENSURE ENLIVE PO LIQD
237.0000 mL | Freq: Three times a day (TID) | ORAL | Status: DC
  Administered 2022-11-14 – 2022-11-16 (×7): 237 mL via ORAL

## 2022-11-14 MED ORDER — BETHANECHOL CHLORIDE 10 MG PO TABS
10.0000 mg | ORAL_TABLET | Freq: Three times a day (TID) | ORAL | Status: DC
  Administered 2022-11-14 – 2022-11-15 (×3): 10 mg via ORAL
  Filled 2022-11-14 (×6): qty 1

## 2022-11-14 MED ORDER — ACETAMINOPHEN 500 MG PO TABS
1000.0000 mg | ORAL_TABLET | Freq: Three times a day (TID) | ORAL | Status: DC
  Administered 2022-11-14 – 2022-11-16 (×7): 1000 mg via ORAL
  Filled 2022-11-14 (×7): qty 2

## 2022-11-14 MED ORDER — CARVEDILOL 3.125 MG PO TABS
3.1250 mg | ORAL_TABLET | Freq: Two times a day (BID) | ORAL | Status: DC
  Administered 2022-11-14 – 2022-11-15 (×2): 3.125 mg via ORAL
  Filled 2022-11-14 (×2): qty 1

## 2022-11-14 MED ORDER — OXYCODONE HCL 5 MG PO TABS
5.0000 mg | ORAL_TABLET | Freq: Three times a day (TID) | ORAL | Status: DC | PRN
  Administered 2022-11-14 – 2022-11-16 (×5): 5 mg via ORAL
  Filled 2022-11-14 (×5): qty 1

## 2022-11-14 MED ORDER — BETHANECHOL CHLORIDE 25 MG PO TABS
25.0000 mg | ORAL_TABLET | Freq: Once | ORAL | Status: AC
  Administered 2022-11-14: 25 mg via ORAL
  Filled 2022-11-14: qty 1

## 2022-11-14 NOTE — Care Management Important Message (Signed)
Important Message  Patient Details  Name: HARVIN MCCULLY MRN: [id number] Date of Birth: 1934/07/01   Medicare Important Message Given:  Yes     Sherilyn Banker 11/14/2022, 1:01 PM

## 2022-11-14 NOTE — Progress Notes (Signed)
PROGRESS NOTE  Luis [name] GNF:[id number] DOB: [birth year]   PCP: Ardith Dark, MD  Patient is from: Home  DOA: 11/11/2022 LOS: 3  Chief complaints Chief Complaint  Patient presents with   Fall    Eliquis      Brief Narrative / Interim history: 88 year old married male, independent, medical history significant for CAD, s/p inferior MI in 2001 with BMS x 2 and DES x 2 in 2013, last cath in 2016 showed nonobstructive and stable CAD, follows with Dr. Clifton James, HTN, HLD, RBBB, TIA, mild carotid disease, persistent A-fib on Eliquis, sustained a mechanical fall at home and admitted for left hip fracture and pain.  Orthopedics consulted.  S/p left hip IM nailing on femoral block by Dr. Carola Frost on 11/12/2022.   Subjective: Seen and examined earlier this morning.  No major events overnight of this morning.  Reports significant pain in left hip with activity.  Pain fairly controlled at rest.  Some confusion per patient's wife at bedside.  Also poor p.o. intake.  Objective: Vitals:   11/13/22 2042 11/14/22 0600 11/14/22 0803 11/14/22 1638  BP: (!) 118/57 (!) 141/94 (!) 135/94 (!) 152/75  Pulse: 66 87 99 (!) 104  Resp: 20  16 16   Temp: 98.7 F (37.1 C) 97.9 F (36.6 C) (!) 97.5 F (36.4 C) 97.8 F (36.6 C)  TempSrc: Oral Oral Oral Oral  SpO2: 97% 96% 97% 97%  Weight:      Height:        Examination:  GENERAL: No apparent distress.  Nontoxic. HEENT: MMM.  Vision and hearing grossly intact.  NECK: Supple.  No apparent JVD.  RESP:  No IWOB.  Fair aeration bilaterally. CVS:  RRR. Heart sounds normal.  ABD/GI/GU: BS+. Abd soft, NTND.  MSK/EXT:  Moves extremities. No apparent deformity. No edema.  SKIN: Dressing over left hip DCI. NEURO: Sleepy but wakes to voice.  Oriented x 4.  No apparent focal neuro deficit. PSYCH: Calm. Normal affect.   Procedures:  Left hip IM nailing on femoral block by Dr. Carola Frost on 11/12/2022.   Microbiology summarized: MRSA PCR screen  nonreactive.  Assessment and plan: Principal Problem:   Hip fracture [facility]) Active Problems:   Atrial fibrillation (HCC)   Coronary atherosclerosis   Closed displaced fracture of left femoral neck (HCC)  Mechanical fall at home Left intertrochanteric femoral fracture due to fall,  -S/p left hip IM nailing on femoral block by Dr. Carola Frost on 11/12/2022 -Scheduled Tylenol with as needed oxycodone for pain control -Bowel regimen as needed -Voiding trial   Acute urinary retention: Had 700 mL on bladder scan on 6/3.  Foley catheter was placed.  Started on Flomax on 6/4. -Bethanechol -Voiding trial -Follows with Dr. Bjorn Pippin, [facility], recommend outpatient follow-up.   Acute posthemorrhagic anemia: EBL about 200 cc?.  Hgb dropped from 14.5-9.1.  No obvious bleeding.  Recent Labs    03/05/22 0912 05/25/22 0850 11/11/22 1641 11/13/22 0039 11/14/22 0035  HGB 15.3 15.5 14.5 11.0* 9.1*  -Monitor H&H.  Transfuse for Hgb less than 8.0 given history of CAD -Check anemia panel in the morning -May have to hold anticoagulation if H&H continues to drop  EBL 200 mL.  Hemoglobin has dropped from 14.5 preop to 11 g per DL, likely multifactorial related to fracture site blood loss, surgical blood loss and hemodilution from IV fluids.  Follow CBCs in AM and transfuse if hemoglobin 7 g or less.   Persistent A-fib: Rate controlled without meds -Continue Eliquis  for anticoagulation   CAD s/p remote MI, and PCI: No cardiopulmonary symptoms.  Last LHC in 2016 with nonobstructive CAD. -Continue atorvastatin, Zetia.  Not on aspirin due to Eliquis. -Outpatient follow-up with cardiology   Essential hypertension: BP slightly elevated.  Does not seem to be on medication at home. -Start low-dose Coreg given history of CAD and A-fib -Pain control   Hyperlipidemia: -Continue atorvastatin and Zetia.   Anxiety and depression: Stable -Continue home dose of bupropion, Prozac.   Vitamin D deficiency:  Vitamin D level 15.8. -Continue  vitamin D 1000 units daily.   Hyperglycemia/prediabetes:: A1c 5.9%  Decreased oral intake/increased nutrient needs Body mass index is 24.82 kg/m. Nutrition Problem: Increased nutrient needs Etiology: hip fracture Signs/Symptoms: estimated needs Interventions: MVI -Consult dietitian   DVT prophylaxis:  SCDs Start: 11/12/22 2108 apixaban (ELIQUIS) tablet 5 mg  Code Status: Full code Family Communication: Updated patient's wife at bedside Level of care: Med-Surg Status is: Inpatient Remains inpatient appropriate because: Left hip fracture   Final disposition: SNF Consultants:  Orthopedic surgery  35 minutes with more than 50% spent in reviewing records, counseling patient/family and coordinating care.   Sch Meds:  Scheduled Meds:  acetaminophen  1,000 mg Oral Q8H   apixaban  5 mg Oral BID   atorvastatin  80 mg Oral Daily   bethanechol  10 mg Oral TID   buPROPion  150 mg Oral Daily   cholecalciferol  1,000 Units Oral Daily   docusate sodium  100 mg Oral BID   ezetimibe  10 mg Oral Daily   feeding supplement  237 mL Oral TID BM   FLUoxetine  20 mg Oral Daily   fluticasone  1 spray Each Nare Daily   magnesium oxide  400 mg Oral Daily   multivitamin with minerals  1 tablet Oral Daily   tamsulosin  0.4 mg Oral Daily   Continuous Infusions: PRN Meds:.hydrALAZINE, menthol-cetylpyridinium **OR** phenol, metoCLOPramide **OR** metoCLOPramide (REGLAN) injection, ondansetron **OR** ondansetron (ZOFRAN) IV, oxyCODONE  Antimicrobials: Anti-infectives (From admission, onward)    Start     Dose/Rate Route Frequency Ordered Stop   11/12/22 2200  ceFAZolin (ANCEF) IVPB 2g/100 mL premix        2 g 200 mL/hr over 30 Minutes Intravenous Every 6 hours 11/12/22 2107 11/13/22 0517   11/12/22 1145  ceFAZolin (ANCEF) IVPB 2g/100 mL premix        2 g 200 mL/hr over 30 Minutes Intravenous On call to O.R. 11/12/22 1048 11/12/22 1816        I have  personally reviewed the following labs and images: CBC: Recent Labs  Lab 11/11/22 1641 11/13/22 0039 11/14/22 0035  WBC 7.1 9.9 10.7*  HGB 14.5 11.0* 9.1*  HCT 42.8 33.0* 27.4*  MCV 93.7 93.8 94.5  PLT 188 151 144*   BMP &GFR Recent Labs  Lab 11/11/22 1641 11/13/22 0039 11/14/22 0035  NA 136 136 134*  K 4.2 4.2 3.8  CL 105 103 101  CO2 22 24 26   GLUCOSE 144* 202* 110*  BUN 13 10 14   CREATININE 1.25* 1.11 0.98  CALCIUM 8.8* 8.5* 8.1*   Estimated Creatinine Clearance: 60.5 mL/min (by C-G formula based on SCr of 0.98 mg/dL). Liver & Pancreas: No results for input(s): "AST", "ALT", "ALKPHOS", "BILITOT", "PROT", "ALBUMIN" in the last 168 hours. No results for input(s): "LIPASE", "AMYLASE" in the last 168 hours. No results for input(s): "AMMONIA" in the last 168 hours. Diabetic: Recent Labs    11/13/22 0039  HGBA1C 5.9*  No results for input(s): "GLUCAP" in the last 168 hours. Cardiac Enzymes: No results for input(s): "CKTOTAL", "CKMB", "CKMBINDEX", "TROPONINI" in the last 168 hours. No results for input(s): "PROBNP" in the last 8760 hours. Coagulation Profile: No results for input(s): "INR", "PROTIME" in the last 168 hours. Thyroid Function Tests: No results for input(s): "TSH", "T4TOTAL", "FREET4", "T3FREE", "THYROIDAB" in the last 72 hours. Lipid Profile: No results for input(s): "CHOL", "HDL", "LDLCALC", "TRIG", "CHOLHDL", "LDLDIRECT" in the last 72 hours. Anemia Panel: No results for input(s): "VITAMINB12", "FOLATE", "FERRITIN", "TIBC", "IRON", "RETICCTPCT" in the last 72 hours. Urine analysis:    Component Value Date/Time   COLORURINE YELLOW 03/04/2017 1445   APPEARANCEUR CLEAR 03/04/2017 1445   LABSPEC <1.005 (L) 03/04/2017 1445   PHURINE 7.0 03/04/2017 1445   GLUCOSEU NEGATIVE 03/04/2017 1445   GLUCOSEU NEGATIVE 06/26/2011 0813   HGBUR NEGATIVE 03/04/2017 1445   BILIRUBINUR NEGATIVE 03/04/2017 1445   KETONESUR NEGATIVE 03/04/2017 1445   PROTEINUR  NEGATIVE 03/04/2017 1445   UROBILINOGEN 0.2 11/29/2014 1735   NITRITE NEGATIVE 03/04/2017 1445   LEUKOCYTESUR NEGATIVE 03/04/2017 1445   Sepsis Labs: Invalid input(s): "PROCALCITONIN", "LACTICIDVEN"  Microbiology: Recent Results (from the past 240 hour(s))  Surgical pcr screen     Status: None   Collection Time: 11/11/22 11:31 PM   Specimen: Nasal Mucosa; Nasal Swab  Result Value Ref Range Status   MRSA, PCR NEGATIVE NEGATIVE Final   Staphylococcus aureus NEGATIVE NEGATIVE Final    Comment: (NOTE) The Xpert SA Assay (FDA approved for NASAL specimens in patients 32 years of age and older), is one component of a comprehensive surveillance program. It is not intended to diagnose infection nor to guide or monitor treatment. Performed at [facility], [address], [place], [place] [address]     Radiology Studies: No results found.    Tonisha Silvey T. Kieu Quiggle [facility]  If 7PM-7AM, please contact night-coverage [email or web address] 11/14/2022, 4:59 PM

## 2022-11-14 NOTE — Progress Notes (Signed)
Physical Therapy Treatment Patient Details Name: Luis [name] MRN: [id number] DOB: [birth year] Today's Date: 11/14/2022   History of Present Illness Pt is 88 year old presented to [facility] on  11/11/22 for lt hip fx and underwent ORIF with IM nail on 11/12/22. PMH - CAD, MI, afib    PT Comments    Pt received in supine and agreeable to session with encouragement with wife present. Pt able to improve bed mobility, requiring min A. Pt with multiple attempts to stand at EOB, however was unsuccessful due to pain. Pt would benefit from extending the LLE when standing to decrease pain,however was unable to follow these cues this session. Pt able to tolerate exercises sitting EOB and in supine for strengthening and ROM. Pt educated on the proper technique and encouraged to perform the bed level exercises throughout the day for increased strength. Pt continues to benefit from PT services to progress toward functional mobility goals.     Recommendations for follow up therapy are one component of a multi-disciplinary discharge planning process, led by the attending physician.  Recommendations may be updated based on patient status, additional functional criteria and insurance authorization.     Assistance Recommended at Discharge Frequent or constant Supervision/Assistance  Patient can return home with the following A lot of help with walking and/or transfers;A lot of help with bathing/dressing/bathroom;Assist for transportation;Help with stairs or ramp for entrance   Equipment Recommendations  Rolling walker (2 wheels);Wheelchair (measurements PT);Wheelchair cushion (measurements PT)    Recommendations for Other Services       Precautions / Restrictions Precautions Precautions: Fall Restrictions Weight Bearing Restrictions: Yes LLE Weight Bearing: Weight bearing as tolerated     Mobility  Bed Mobility Overal bed mobility: Needs Assistance Bed Mobility: Supine to Sit, Sit to Supine     Supine  to sit: Min assist Sit to supine: Min assist   General bed mobility comments: Min A for LLE management and trunk elevation    Transfers                   General transfer comment: Pt attempted x3, however was unsuccessful due to increased pain with WB. Pt declined further attempts.       Balance Overall balance assessment: Needs assistance Sitting-balance support: No upper extremity supported, Feet supported Sitting balance-Leahy Scale: Good Sitting balance - Comments: sitting EOB       Standing balance comment: not tested                            Cognition Arousal/Alertness: Awake/alert Behavior During Therapy: WFL for tasks assessed/performed Overall Cognitive Status: Within Functional Limits for tasks assessed                                          Exercises General Exercises - Lower Extremity Quad Sets: AROM, Supine, Both, 5 reps Long Arc Quad: AROM, Seated, Both, 10 reps Heel Slides: AROM, Supine, Left, 5 reps    General Comments        Pertinent Vitals/Pain Pain Assessment Pain Assessment: Faces Faces Pain Scale: Hurts whole lot Pain Location: L hip Pain Descriptors / Indicators: Aching, Discomfort, Guarding, Grimacing, Crying Pain Intervention(s): Monitored during session, Limited activity within patient's tolerance, Repositioned     PT Goals (current goals can now be found in the care plan section) Acute Rehab  PT Goals Patient Stated Goal: return home PT Goal Formulation: With patient Time For Goal Achievement: 11/27/22 Potential to Achieve Goals: Good Progress towards PT goals: Progressing toward goals    Frequency    Min 4X/week      PT Plan Current plan remains appropriate       AM-PAC PT "6 Clicks" Mobility   Outcome Measure  Help needed turning from your back to your side while in a flat bed without using bedrails?: A Little Help needed moving from lying on your back to sitting on the side of  a flat bed without using bedrails?: A Little Help needed moving to and from a bed to a chair (including a wheelchair)?: A Lot Help needed standing up from a chair using your arms (e.g., wheelchair or bedside chair)?: A Lot Help needed to walk in hospital room?: Total Help needed climbing 3-5 steps with a railing? : Total 6 Click Score: 12    End of Session   Activity Tolerance: Patient limited by pain Patient left: in bed;with call bell/phone within reach;with family/visitor present;with bed alarm set Nurse Communication: Mobility status PT Visit Diagnosis: Other abnormalities of gait and mobility (R26.89);History of falling (Z91.81);Muscle weakness (generalized) (M62.81);Pain Pain - Right/Left: Left Pain - part of body: Hip     Time: 0454-0981 PT Time Calculation (min) (ACUTE ONLY): 24 min  Charges:  $Therapeutic Exercise: 8-22 mins $Therapeutic Activity: 8-22 mins                     Johny Shock, PTA Acute Rehabilitation Services Secure Chat Preferred  [phone]    Johny Shock 11/14/2022, 12:24 PM

## 2022-11-14 NOTE — TOC Initial Note (Signed)
Transition of Care [facility]) - Initial/Assessment Note    Patient Details  Name: Luis [name] MRN: [id number] Date of Birth: 09/21/1934  Transition of Care [facility]) CM/SW Contact:    Carley Hammed, LCSW Phone Number: 11/14/2022, 10:12 AM  Clinical Narrative:                 CSW reviewed chart and noted CIR recommendations. CIR has signed off due to insurance not likely to cover. CSW spoke with pt and spouse to discuss SNF as an option. Pt and spouse are not ready to commit to SNF and asked about CIR. CSW advised of CIR barriers, pt and spouse would like to be reviewed again, even if it means private paying. CSW consulted CIR again. CSW was advised that pt is not meeting medical criteria for CIR, and they are not able to do private pay for this pt. PT to see again today, family asking for CSW to see them again tomorrow to discuss disposition options again. TOC will continue to follow for DC needs.   Expected Discharge Plan:  (TBD) Barriers to Discharge: Continued Medical Work up, SNF Pending bed offer, Insurance Authorization   Patient Goals and CMS Choice Patient states their goals for this hospitalization and ongoing recovery are:: Pt wants to be able to return home.   Choice offered to / list presented to : Patient, Spouse      Expected Discharge Plan and Services       Living arrangements for the past 2 months: Single Family Home                                      Prior Living Arrangements/Services Living arrangements for the past 2 months: Single Family Home Lives with:: Spouse Patient language and need for interpreter reviewed:: Yes Do you feel safe going back to the place where you live?: Yes      Need for Family Participation in Patient Care: Yes (Comment) Care giver support system in place?: Yes (comment)   Criminal Activity/Legal Involvement Pertinent to Current Situation/Hospitalization: No - Comment as needed  Activities of Daily Living      Permission  Sought/Granted Permission sought to share information with : Family Supports Permission granted to share information with : Yes, Verbal Permission Granted  Share Information with NAME: Darl Pikes     Permission granted to share info w Relationship: Spouse     Emotional Assessment Appearance:: Appears stated age Attitude/Demeanor/Rapport: Engaged Affect (typically observed): Appropriate Orientation: : Oriented to Self, Oriented to Place, Oriented to  Time, Oriented to Situation Alcohol / Substance Use: Not Applicable Psych Involvement: No (comment)  Admission diagnosis:  Hip fracture (HCC) [S72.009A] Anticoagulated [Z79.01] Fall, initial encounter [W19.XXXA] Closed fracture of left femur, unspecified fracture morphology, unspecified portion of femur, initial encounter [facility]) [S72.92XA] Patient Active Problem List   Diagnosis Date Noted   Closed displaced fracture of left femoral neck (HCC) 11/11/2022   Hip fracture (HCC) 11/11/2022   Prostate cancer (HCC) 05/25/2021   Major depression, recurrent, chronic (HCC) 02/19/2019   Mild carotid artery disease (HCC) 02/19/2019   Insomnia 02/18/2018   Dizziness 11/27/2017   Unsteady gait 10/14/2017   TIA (transient ischemic attack) 11/29/2014   Atrial fibrillation (HCC) 11/29/2014   HTN (hypertension) 11/29/2014   HLD (hyperlipidemia) 10/27/2007   Coronary atherosclerosis 10/27/2007   COLONIC POLYPS, HX OF 10/27/2007   PCP:  Ardith Dark, MD  Pharmacy:   [facility] - [place], [place] - [address] [place] [address] Phone: [phone] Fax: [phone]     Social Determinants of Health (SDOH) Social History: SDOH Screenings   Food Insecurity: No Food Insecurity (04/05/2022)  Housing: Low Risk  (04/05/2022)  Transportation Needs: No Transportation Needs (04/05/2022)  Depression (PHQ2-9): Low Risk  (05/25/2022)  Financial Resource Strain: Low Risk  (04/05/2022)  Physical Activity:  Sufficiently Active (04/05/2022)  Social Connections: Socially Integrated (04/05/2022)  Stress: No Stress Concern Present (04/05/2022)  Tobacco Use: Medium Risk (11/12/2022)   SDOH Interventions:     Readmission Risk Interventions     No data to display

## 2022-11-14 NOTE — Progress Notes (Signed)
Orthopaedic Trauma Service Progress Note  Patient ID: Luis [name] MRN: [id number] DOB/AGE: 1934-09-15 88 y.o.  Subjective:  More pain today but ok  No other complaints Wife does note he is note eating a lot---> I have ordered ensure TID between meals  Really wanted CIR but per CIR note his insurance does not cover for his injury nor do they accept private pay as a work around to private insurance    ROS As above  Objective:   VITALS:   Vitals:   11/13/22 1643 11/13/22 2042 11/14/22 0600 11/14/22 0803  BP: 129/66 (!) 118/57 (!) 141/94 (!) 135/94  Pulse: (!) 104 66 87 99  Resp: 17 20  16   Temp: 98.2 F (36.8 C) 98.7 F (37.1 C) 97.9 F (36.6 C) (!) 97.5 F (36.4 C)  TempSrc: Oral Oral Oral Oral  SpO2: 97% 97% 96% 97%  Weight:      Height:        Estimated body mass index is 24.82 kg/m as calculated from the following:   Height as of this encounter: 6' (1.829 m).   Weight as of this encounter: 83 kg.   Intake/Output      06/04 0701 06/05 0700 06/05 0701 06/06 0700   P.O.     I.V. (mL/kg)     IV Piggyback     Total Intake(mL/kg)     Urine (mL/kg/hr) 1850 (0.9)    Stool 1    Blood     Total Output 1851    Net -1851         Stool Occurrence 1 x      LABS  Results for orders placed or performed during the hospital encounter of 11/11/22 (from the past 24 hour(s))  CBC     Status: Abnormal   Collection Time: 11/14/22 12:35 AM  Result Value Ref Range   WBC 10.7 (H) 4.0 - 10.5 K/uL   RBC 2.90 (L) 4.22 - 5.81 MIL/uL   Hemoglobin 9.1 (L) 13.0 - 17.0 g/dL   HCT 95.6 (L) 21.3 - 08.6 %   MCV 94.5 80.0 - 100.0 fL   MCH 31.4 26.0 - 34.0 pg   MCHC 33.2 30.0 - 36.0 g/dL   RDW 57.8 46.9 - 62.9 %   Platelets 144 (L) 150 - 400 K/uL   nRBC 0.0 0.0 - 0.2 %  Basic metabolic panel     Status: Abnormal   Collection Time: 11/14/22 12:35 AM  Result Value Ref Range   Sodium 134 (L) 135 - 145  mmol/L   Potassium 3.8 3.5 - 5.1 mmol/L   Chloride 101 98 - 111 mmol/L   CO2 26 22 - 32 mmol/L   Glucose, Bld 110 (H) 70 - 99 mg/dL   BUN 14 8 - 23 mg/dL   Creatinine, Ser 5.28 0.61 - 1.24 mg/dL   Calcium 8.1 (L) 8.9 - 10.3 mg/dL   GFR, Estimated >41 >32 mL/min   Anion gap 7 5 - 15     PHYSICAL EXAM:   Gen: resting comfortably in bed, NAD, wife at bedside  Lungs: unlabored Ext:       Left Lower Extremity              Dressings clean, dry and intact  Ext warm              + dp pulse             No DCT             No pitting edema             Swelling minimal              EHL, FHL, lesser toe motor intact             Ankle flexion, extension, inversion and eversion intact             + Quad set              Good perfusion distally   Assessment/Plan: 2 Days Post-Op   Principal Problem:   Hip fracture (HCC) Active Problems:   Coronary atherosclerosis   Atrial fibrillation (HCC)   Closed displaced fracture of left femoral neck (HCC)   Anti-infectives (From admission, onward)    Start     Dose/Rate Route Frequency Ordered Stop   11/12/22 2200  ceFAZolin (ANCEF) IVPB 2g/100 mL premix        2 g 200 mL/hr over 30 Minutes Intravenous Every 6 hours 11/12/22 2107 11/13/22 0517   11/12/22 1145  ceFAZolin (ANCEF) IVPB 2g/100 mL premix        2 g 200 mL/hr over 30 Minutes Intravenous On call to O.R. 11/12/22 1048 11/12/22 1816     .  POD/HD#: 2  88 y/o male s/p fall with L hip fracture    -Left hip fracture s/p IMN  Weightbearing WBAT with walker               ROM/Activity                         ROM L hip and knee as tolerated                         Activity as tolerated                Wound care                         Dressing changes starting on 11/15/2022               PT/OT evals             Ice prn L hip                           will need snf    - Pain management:             Multimodal              Minimize narcotics                           Scheduled tylenol                          Low dose Oxy IR for breakthrough pain    - ABL anemia/Hemodynamics             Stable              Monitor CBC              -  Medical issues              Per primary    - DVT/PE prophylaxis:             home eliquis    - ID:              Periop abx   - Metabolic Bone Disease:             Vitamin d deficiency                          Supplement             Fracture is a fragility fracture                          Outpt work up                          Dexa                          Referral to osteoporosis clinic              Appreciate RD recs    Added ensure shakes TID    - Activity:             As above   - FEN/GI prophylaxis/Foley/Lines:             Foley for urinary retention   - Impediments to fracture healing:             Vitamin d deficiency              Fragility fracture              Age               - Dispo:             Therapies   TOC for SNF    Mearl Latin, PA-C [phone] (C) 11/14/2022, 10:13 AM  [facility] [address] [place] [address] [phone] [phone] (F)    After 5pm and on the weekends please log on to Amion, go to orthopaedics and the look under the [facility] Call for the provider(s) on call. You can also call our office at [phone] and then follow the prompts to be connected to the call team.  Patient ID: Luis [name], male   DOB: 22-Sep-1934, 88 y.o.   MRN: [id number]

## 2022-11-15 ENCOUNTER — Encounter (HOSPITAL_COMMUNITY): Payer: Self-pay | Admitting: Orthopedic Surgery

## 2022-11-15 DIAGNOSIS — I4819 Other persistent atrial fibrillation: Secondary | ICD-10-CM | POA: Diagnosis not present

## 2022-11-15 DIAGNOSIS — I2510 Atherosclerotic heart disease of native coronary artery without angina pectoris: Secondary | ICD-10-CM | POA: Diagnosis not present

## 2022-11-15 DIAGNOSIS — S72002A Fracture of unspecified part of neck of left femur, initial encounter for closed fracture: Secondary | ICD-10-CM | POA: Diagnosis not present

## 2022-11-15 DIAGNOSIS — S72002D Fracture of unspecified part of neck of left femur, subsequent encounter for closed fracture with routine healing: Secondary | ICD-10-CM | POA: Diagnosis not present

## 2022-11-15 LAB — GLUCOSE, CAPILLARY
Glucose-Capillary: 110 mg/dL — ABNORMAL HIGH (ref 70–99)
Glucose-Capillary: 153 mg/dL — ABNORMAL HIGH (ref 70–99)
Glucose-Capillary: 133 mg/dL — ABNORMAL HIGH (ref 70–99)
Glucose-Capillary: 130 mg/dL — ABNORMAL HIGH (ref 70–99)

## 2022-11-15 LAB — VITAMIN B12: Vitamin B-12: 635 pg/mL (ref 180–914)

## 2022-11-15 LAB — CBC
HCT: 26.5 % — ABNORMAL LOW (ref 39.0–52.0)
Hemoglobin: 9.2 g/dL — ABNORMAL LOW (ref 13.0–17.0)
MCH: 32.1 pg (ref 26.0–34.0)
MCHC: 34.7 g/dL (ref 30.0–36.0)
MCV: 92.3 fL (ref 80.0–100.0)
Platelets: 169 10*3/uL (ref 150–400)
RBC: 2.87 MIL/uL — ABNORMAL LOW (ref 4.22–5.81)
RDW: 13.9 % (ref 11.5–15.5)
WBC: 9.4 10*3/uL (ref 4.0–10.5)
nRBC: 0 % (ref 0.0–0.2)

## 2022-11-15 LAB — RETICULOCYTES
Immature Retic Fract: 12.9 % (ref 2.3–15.9)
RBC.: 2.88 MIL/uL — ABNORMAL LOW (ref 4.22–5.81)
Retic Count, Absolute: 43.8 10*3/uL (ref 19.0–186.0)
Retic Ct Pct: 1.5 % (ref 0.4–3.1)

## 2022-11-15 LAB — IRON AND TIBC
Iron: 29 ug/dL — ABNORMAL LOW (ref 45–182)
Saturation Ratios: 14 % — ABNORMAL LOW (ref 17.9–39.5)
TIBC: 202 ug/dL — ABNORMAL LOW (ref 250–450)
UIBC: 173 ug/dL

## 2022-11-15 LAB — FOLATE: Folate: 6.5 ng/mL (ref >5.9)

## 2022-11-15 LAB — FERRITIN: Ferritin: 124 ng/mL (ref 24–336)

## 2022-11-15 MED ORDER — VITAMIN D 25 MCG (1000 UNIT) PO TABS
2000.0000 [IU] | ORAL_TABLET | Freq: Every day | ORAL | Status: DC
  Administered 2022-11-16: 2000 [IU] via ORAL
  Filled 2022-11-15: qty 2

## 2022-11-15 MED ORDER — CARVEDILOL 6.25 MG PO TABS
6.2500 mg | ORAL_TABLET | Freq: Two times a day (BID) | ORAL | Status: DC
  Administered 2022-11-15 – 2022-11-16 (×2): 6.25 mg via ORAL
  Filled 2022-11-15 (×2): qty 1

## 2022-11-15 NOTE — Progress Notes (Signed)
Patients wife declined bethanechol  scheduled at 1600 due to patient being asleep. Wife stated patient had not been sleeping and is finally able to get rest. Patient is having a good amount of output.

## 2022-11-15 NOTE — Progress Notes (Signed)
Physical Therapy Treatment Patient Details Name: Luis [name] MRN: [id number] DOB: April 06, 1935 Today's Date: 11/15/2022   History of Present Illness Pt is 88 year old presented to [facility] on  11/11/22 for lt hip fx and underwent ORIF with IM nail on 11/12/22. PMH - CAD, MI, afib    PT Comments    Pt received in supine and agreeable to session with encouragement. Pt requesting to use the [facility] at the beginning of the session. Pt demonstrating improved bed mobility with min A for LLE management. Pt able to stand from various surfaces with up to max A for power up and balance. Pt able to stand pivot to the [facility] and stand for assist with pericare. Pt able to perform short gait distance with encouragement. Pt demonstrating slightly improved step-to pattern and BUE support on RW. Pt becoming discouraged from loss of independence and requires encouragement to participate in mobility despite pain. Pt continues to benefit from PT services to progress toward functional mobility goals.     Recommendations for follow up therapy are one component of a multi-disciplinary discharge planning process, led by the attending physician.  Recommendations may be updated based on patient status, additional functional criteria and insurance authorization.     Assistance Recommended at Discharge Frequent or constant Supervision/Assistance  Patient can return home with the following A lot of help with walking and/or transfers;A lot of help with bathing/dressing/bathroom;Assist for transportation;Help with stairs or ramp for entrance   Equipment Recommendations  Rolling walker (2 wheels);Wheelchair (measurements PT);Wheelchair cushion (measurements PT)    Recommendations for Other Services       Precautions / Restrictions Precautions Precautions: Fall Restrictions Weight Bearing Restrictions: Yes LLE Weight Bearing: Weight bearing as tolerated     Mobility  Bed Mobility Overal bed mobility: Needs Assistance Bed  Mobility: Supine to Sit     Supine to sit: Min assist     General bed mobility comments: Min A for LLE elevation, but pt then able to advance it to EOB    Transfers Overall transfer level: Needs assistance Equipment used: Rolling walker (2 wheels) Transfers: Sit to/from Stand, Bed to chair/wheelchair/BSC Sit to Stand: Max assist Stand pivot transfers: Mod assist         General transfer comment: From EOB, BSC, and recliner with max A for power up and cues for hand placement and LLE positioning to reduce pain. Stand pivot from EOB to [facility] with mod A for balance and RW management. Cues for increased BUE support to offload LLE    Ambulation/Gait Ambulation/Gait assistance: Min assist Gait Distance (Feet): 3 Feet Assistive device: Rolling walker (2 wheels) Gait Pattern/deviations: Step-to pattern, Antalgic, Decreased stance time - left, Decreased stride length Gait velocity: decr     General Gait Details: Cues for sequencing. Pt with very limited LLE WB and hopping at one point requiring cues to increased WB. Assist for balance and RW management      Balance Overall balance assessment: Needs assistance Sitting-balance support: No upper extremity supported, Feet supported Sitting balance-Leahy Scale: Good Sitting balance - Comments: sitting EOB   Standing balance support: Bilateral upper extremity supported, During functional activity, Reliant on assistive device for balance Standing balance-Leahy Scale: Poor Standing balance comment: with RW support                            Cognition Arousal/Alertness: Awake/alert Behavior During Therapy: WFL for tasks assessed/performed Overall Cognitive Status: Within Functional Limits for  tasks assessed                                          Exercises      General Comments        Pertinent Vitals/Pain Pain Assessment Pain Assessment: Faces Faces Pain Scale: Hurts whole lot Pain Location: L  hip with WB Pain Descriptors / Indicators: Aching, Discomfort, Guarding, Grimacing, Crying Pain Intervention(s): Monitored during session, Repositioned     PT Goals (current goals can now be found in the care plan section) Acute Rehab PT Goals Patient Stated Goal: return home PT Goal Formulation: With patient Time For Goal Achievement: 11/27/22 Potential to Achieve Goals: Good Progress towards PT goals: Progressing toward goals    Frequency    Min 4X/week      PT Plan Current plan remains appropriate       AM-PAC PT "6 Clicks" Mobility   Outcome Measure  Help needed turning from your back to your side while in a flat bed without using bedrails?: A Little Help needed moving from lying on your back to sitting on the side of a flat bed without using bedrails?: A Little Help needed moving to and from a bed to a chair (including a wheelchair)?: A Lot Help needed standing up from a chair using your arms (e.g., wheelchair or bedside chair)?: A Lot Help needed to walk in hospital room?: A Lot Help needed climbing 3-5 steps with a railing? : Total 6 Click Score: 13    End of Session Equipment Utilized During Treatment: Gait belt Activity Tolerance: Patient limited by pain Patient left: in chair;with family/visitor present;with call bell/phone within reach Nurse Communication: Mobility status PT Visit Diagnosis: Other abnormalities of gait and mobility (R26.89);History of falling (Z91.81);Muscle weakness (generalized) (M62.81);Pain Pain - Right/Left: Left Pain - part of body: Hip     Time: 1610-9604 PT Time Calculation (min) (ACUTE ONLY): 35 min  Charges:  $Gait Training: 8-22 mins $Therapeutic Activity: 8-22 mins                     Luis [name], PTA Acute Rehabilitation Services Secure Chat Preferred  [phone]    Luis [name] 11/15/2022, 10:07 AM

## 2022-11-15 NOTE — Progress Notes (Signed)
Orthopaedic Trauma Service Progress Note  Patient ID: KEMARRI ARMENTROUT MRN: [id number] DOB/AGE: 86-17-1936 86 y.o.  Subjective:  Feeling better Up in chair No acute concerns SNF search ongoing    ROS As above  Objective:   VITALS:   Vitals:   11/14/22 1638 11/14/22 2024 11/15/22 0613 11/15/22 0843  BP: (!) 152/75 128/78 (!) 144/95 (!) 162/78  Pulse: (!) 104 81 83 96  Resp: 16   17  Temp: 97.8 F (36.6 C) 98.1 F (36.7 C) 98.1 F (36.7 C) 98 F (36.7 C)  TempSrc: Oral Oral  Oral  SpO2: 97% 99% 96% 98%  Weight:      Height:        Estimated body mass index is 24.82 kg/m as calculated from the following:   Height as of this encounter: 6' (1.829 m).   Weight as of this encounter: 83 kg.   Intake/Output      06/05 0701 06/06 0700 06/06 0701 06/07 0700   P.O. 240    Total Intake(mL/kg) 240 (2.9)    Urine (mL/kg/hr) 300 (0.2)    Stool 0    Total Output 300    Net -60         Urine Occurrence 2 x    Stool Occurrence 2 x      LABS  Results for orders placed or performed during the hospital encounter of 11/11/22 (from the past 24 hour(s))  Glucose, capillary     Status: Abnormal   Collection Time: 11/14/22  5:08 PM  Result Value Ref Range   Glucose-Capillary 144 (H) 70 - 99 mg/dL   Comment 1 Notify RN   Glucose, capillary     Status: Abnormal   Collection Time: 11/14/22  8:24 PM  Result Value Ref Range   Glucose-Capillary 160 (H) 70 - 99 mg/dL  CBC     Status: Abnormal   Collection Time: 11/15/22  3:17 AM  Result Value Ref Range   WBC 9.4 4.0 - 10.5 K/uL   RBC 2.87 (L) 4.22 - 5.81 MIL/uL   Hemoglobin 9.2 (L) 13.0 - 17.0 g/dL   HCT 91.4 (L) 78.2 - 95.6 %   MCV 92.3 80.0 - 100.0 fL   MCH 32.1 26.0 - 34.0 pg   MCHC 34.7 30.0 - 36.0 g/dL   RDW 21.3 08.6 - 57.8 %   Platelets 169 150 - 400 K/uL   nRBC 0.0 0.0 - 0.2 %  Vitamin B12     Status: None   Collection Time: 11/15/22   3:17 AM  Result Value Ref Range   Vitamin B-12 635 180 - 914 pg/mL  Folate     Status: None   Collection Time: 11/15/22  3:17 AM  Result Value Ref Range   Folate 6.5 >5.9 ng/mL  Iron and TIBC     Status: Abnormal   Collection Time: 11/15/22  3:17 AM  Result Value Ref Range   Iron 29 (L) 45 - 182 ug/dL   TIBC 469 (L) 629 - 528 ug/dL   Saturation Ratios 14 (L) 17.9 - 39.5 %   UIBC 173 ug/dL  Ferritin     Status: None   Collection Time: 11/15/22  3:17 AM  Result Value Ref Range   Ferritin 124 24 - 336 ng/mL  Reticulocytes  Status: Abnormal   Collection Time: 11/15/22  3:17 AM  Result Value Ref Range   Retic Ct Pct 1.5 0.4 - 3.1 %   RBC. 2.88 (L) 4.22 - 5.81 MIL/uL   Retic Count, Absolute 43.8 19.0 - 186.0 K/uL   Immature Retic Fract 12.9 2.3 - 15.9 %  Glucose, capillary     Status: Abnormal   Collection Time: 11/15/22  8:41 AM  Result Value Ref Range   Glucose-Capillary 110 (H) 70 - 99 mg/dL  Glucose, capillary     Status: Abnormal   Collection Time: 11/15/22 11:53 AM  Result Value Ref Range   Glucose-Capillary 153 (H) 70 - 99 mg/dL     PHYSICAL EXAM:   Gen: resting comfortably in chair, NAD, family at bedside  Lungs: unlabored Ext:       Left Lower Extremity              Dressings clean, dry and intact             Ext warm              + dp pulse             No DCT             No pitting edema             Swelling minimal              EHL, FHL, lesser toe motor intact             Ankle flexion, extension, inversion and eversion intact             + Quad set              Good perfusion distally     Assessment/Plan: 3 Days Post-Op     Anti-infectives (From admission, onward)    Start     Dose/Rate Route Frequency Ordered Stop   11/12/22 2200  ceFAZolin (ANCEF) IVPB 2g/100 mL premix        2 g 200 mL/hr over 30 Minutes Intravenous Every 6 hours 11/12/22 2107 11/13/22 0517   11/12/22 1145  ceFAZolin (ANCEF) IVPB 2g/100 mL premix        2 g 200 mL/hr  over 30 Minutes Intravenous On call to O.R. 11/12/22 1048 11/12/22 1816     .  POD/HD#: 80  88 y/o male s/p fall with L hip fracture    -Left hip fracture s/p IMN  Weightbearing WBAT with walker               ROM/Activity                         ROM L hip and knee as tolerated                         Activity as tolerated                Wound care                         Dressing changes as needed starting now               PT/OT evals             Ice prn L hip  will need snf    - Pain management:             Multimodal              Minimize narcotics                          Scheduled tylenol                          Low dose Oxy IR for breakthrough pain    - ABL anemia/Hemodynamics             Stable               - Medical issues              Per primary    - DVT/PE prophylaxis:             home eliquis    - ID:              Periop abx   - Metabolic Bone Disease:             Vitamin d deficiency                          Supplement             Fracture is a fragility fracture                          Outpt work up                          Dexa                          Referral to osteoporosis clinic              Appreciate RD recs                          Added ensure shakes TID    - Activity:             As above   - FEN/GI prophylaxis/Foley/Lines:             Foley for urinary retention   - Impediments to fracture healing:             Vitamin d deficiency              Fragility fracture              Age               - Dispo:             Therapies              TOC for SNF   Ortho issues stable  Ok to dc to snf once bed available  Follow up with ortho in 10-14 days     Mearl Latin, PA-C [phone] (C) 11/15/2022, 12:43 PM  [facility] [address] [place] [address] [phone] Collier Bullock (F)    After 5pm and on the weekends please log on to Amion, go to orthopaedics and  the look under the [facility] Call for the provider(s) on call. You can also  call our office at [phone] and then follow the prompts to be connected to the call team.  Patient ID: [name], male   DOB: 07/22/1934, 88 y.o.   MRN: [id number]

## 2022-11-15 NOTE — TOC Progression Note (Addendum)
Transition of Care [facility]) - Progression Note    Patient Details  Name: Luis [name] MRN: [id number] Date of Birth: May 14, 1935  Transition of Care [facility]) CM/SW Contact  Carley Hammed, LCSW Phone Number: 11/15/2022, 10:37 AM  Clinical Narrative:     CSW met with pt and spouse at bedside to discuss SNF. Pt and spouse agreeable and Medicare list provided. Pt noted that facility does not need to be in network, he is willing to pay OOP. Spouse notes a preference for [facility] or [facility]. CSW advised that referrals would be sent, but no promise of acceptance can be made. TOC will fax out referrals and follow up with pt and spouse with offers.   12:50 Pt and spouse have chosen [facility]. [facility] advised of a $43 a day copay, family agreeable. Per MD pt is medically ready to DC. Authorization started and approved. Will plan to DC tomorrow. TOC will continue to follow for DC needs.   2:00 Auth approved, pt will go to rm 117 tomorrow. Call to report will be [phone]. Facility spoke with spouse all questions answered. TOC will continue to follow for DC needs.  Expected Discharge Plan:  (TBD) Barriers to Discharge: Continued Medical Work up, SNF Pending bed offer, English as a second language teacher  Expected Discharge Plan and Services       Living arrangements for the past 2 months: Single Family Home                                       Social Determinants of Health (SDOH) Interventions SDOH Screenings   Food Insecurity: No Food Insecurity (04/05/2022)  Housing: Low Risk  (04/05/2022)  Transportation Needs: No Transportation Needs (04/05/2022)  Depression (PHQ2-9): Low Risk  (05/25/2022)  Financial Resource Strain: Low Risk  (04/05/2022)  Physical Activity: Sufficiently Active (04/05/2022)  Social Connections: Socially Integrated (04/05/2022)  Stress: No Stress Concern Present (04/05/2022)  Tobacco Use: Medium Risk (11/12/2022)    Readmission Risk Interventions     No data  to display

## 2022-11-15 NOTE — Progress Notes (Signed)
Occupational Therapy Treatment Patient Details Name: Luis [name] MRN: [id number] DOB: September 01, 1934 Today's Date: 11/15/2022   History of present illness Pt is 88 year old presented to [facility] on  11/11/22 for lt hip fx and underwent ORIF with IM nail on 11/12/22. PMH - CAD, MI, afib   OT comments  Pt progressing toward goals. Increased ability to take several steps however having difficulty bearing weight through LLE. Wife present for session and educated on exercises and how to help her husband at bed level. Patient will benefit from continued inpatient follow up therapy, <3 hours/day. Acute OT to follow.    Recommendations for follow up therapy are one component of a multi-disciplinary discharge planning process, led by the attending physician.  Recommendations may be updated based on patient status, additional functional criteria and insurance authorization.    Assistance Recommended at Discharge Frequent or constant Supervision/Assistance  Patient can return home with the following  Two people to help with walking and/or transfers;Two people to help with bathing/dressing/bathroom;Assist for transportation;Help with stairs or ramp for entrance   Equipment Recommendations  BSC/3in1;Wheelchair (measurements OT);Wheelchair cushion (measurements OT)    Recommendations for Other Services      Precautions / Restrictions Precautions Precautions: Fall Restrictions Weight Bearing Restrictions: Yes LLE Weight Bearing: Weight bearing as tolerated       Mobility Bed Mobility Overal bed mobility: Needs Assistance         Sit to supine: Min assist   General bed mobility comments: Demosntrated how to assist leg on/off bed by crossing legs    Transfers Overall transfer level: Needs assistance Equipment used: Rolling walker (2 wheels) Transfers: Sit to/from Stand, Bed to chair/wheelchair/BSC Sit to Stand: Min assist     Step pivot transfers: Max assist     General transfer comment:  Increased ability to stand however max A to step pivot requiring MAx A to return to bed; difficulty with sequencing steps     Balance     Sitting balance-Leahy Scale: Good       Standing balance-Leahy Scale: Poor                             ADL either performed or assessed with clinical judgement   ADL Overall ADL's : Needs assistance/impaired                     Lower Body Dressing: Maximal assistance;Sit to/from stand               Functional mobility during ADLs: Rolling walker (2 wheels);Moderate assistance      Extremity/Trunk Assessment Upper Extremity Assessment Upper Extremity Assessment: Generalized weakness            Vision       Perception     Praxis      Cognition Arousal/Alertness: Awake/alert Behavior During Therapy: WFL for tasks assessed/performed Overall Cognitive Status: Within Functional Limits for tasks assessed                                 General Comments: more difficulty sequencing mobility today        Exercises Exercises: General Upper Extremity, Other exercises Total Joint Exercises Heel Slides: 10 reps, Left General Exercises - Upper Extremity Elbow Extension: Strengthening, Both, 10 reps Chair Push Up: 5 reps Other Exercises Other Exercises: bridging with strong RLE Other Exercises: ab/adduction with  gait belt to help with bed mobility Educated on general UE theraband strengthening ex - level 2   Shoulder Instructions       General Comments      Pertinent Vitals/ Pain       Pain Assessment Pain Assessment: 0-10 Pain Score: 8  Pain Location: L hip with WB Pain Descriptors / Indicators: Aching, Discomfort, Guarding, Grimacing, Crying Pain Intervention(s): Limited activity within patient's tolerance  Home Living                                          Prior Functioning/Environment              Frequency  Min 2X/week        Progress Toward  Goals  OT Goals(current goals can now be found in the care plan section)  Progress towards OT goals: Progressing toward goals  Acute Rehab OT Goals Patient Stated Goal: to get some rehab OT Goal Formulation: With patient Time For Goal Achievement: 11/27/22 Potential to Achieve Goals: Good ADL Goals Pt Will Perform Lower Body Bathing: with min assist;sit to/from stand;with adaptive equipment Pt Will Transfer to Toilet: with min assist;stand pivot transfer Pt Will Perform Toileting - Clothing Manipulation and hygiene: with min assist;sitting/lateral leans Additional ADL Goal #1: Bed mobility with min guard A in preparation for ADL tasks  Plan Discharge plan remains appropriate    Co-evaluation                 AM-PAC OT "6 Clicks" Daily Activity     Outcome Measure   Help from another person eating meals?: None Help from another person taking care of personal grooming?: A Little Help from another person toileting, which includes using toliet, bedpan, or urinal?: Total Help from another person bathing (including washing, rinsing, drying)?: A Lot Help from another person to put on and taking off regular upper body clothing?: A Little Help from another person to put on and taking off regular lower body clothing?: A Lot 6 Click Score: 15    End of Session Equipment Utilized During Treatment: Gait belt;Rolling walker (2 wheels)  OT Visit Diagnosis: Unsteadiness on feet (R26.81);Other abnormalities of gait and mobility (R26.89);Muscle weakness (generalized) (M62.81);History of falling (Z91.81);Pain Pain - Right/Left: Left Pain - part of body: Hip   Activity Tolerance Patient tolerated treatment well   Patient Left in bed;with call bell/phone within reach;with bed alarm set;with family/visitor present;with SCD's reapplied   Nurse Communication Mobility status;Weight bearing status;Patient requests pain meds        Time: 9604-5409 OT Time Calculation (min): 23  min  Charges: OT General Charges $OT Visit: 1 Visit OT Treatments $Self Care/Home Management : 23-37 mins  Luisa Dago, OT/L   Acute OT Clinical Specialist Acute Rehabilitation Services Pager [phone] Office [phone]   [facility] 11/15/2022, 2:00 PM

## 2022-11-15 NOTE — [place] FL2 (Signed)
[place] MEDICAID FL2 LEVEL OF CARE FORM     IDENTIFICATION  Patient Name: Luis [name] Birthdate: 07/19/1934 Sex: male Admission Date (Current Location): 11/11/2022  [facility] and [place] Number:  Producer, television/film/video and Address:  [facility] [place], [address] [place], [place] [address]      Provider Number: [id number]  Attending Physician Name and Address:  Almon Hercules, MD  Relative Name and Phone Number:  Jshawn Lunde,  [phone]    Current Level of Care: Hospital Recommended Level of Care: Skilled Nursing Facility Prior Approval Number:    Date Approved/Denied:   PASRR Number: [id number]  Discharge Plan: SNF    Current Diagnoses: Patient Active Problem List   Diagnosis Date Noted   Closed displaced fracture of left femoral neck (HCC) 11/11/2022   Hip fracture (HCC) 11/11/2022   Prostate cancer (HCC) 05/25/2021   Major depression, recurrent, chronic (HCC) 02/19/2019   Mild carotid artery disease (HCC) 02/19/2019   Insomnia 02/18/2018   Dizziness 11/27/2017   Unsteady gait 10/14/2017   TIA (transient ischemic attack) 11/29/2014   Atrial fibrillation (HCC) 11/29/2014   HTN (hypertension) 11/29/2014   HLD (hyperlipidemia) 10/27/2007   Coronary atherosclerosis 10/27/2007   COLONIC POLYPS, HX OF 10/27/2007    Orientation RESPIRATION BLADDER Height & Weight     Self, Time, Situation, Place  Normal Continent Weight: 182 lb 15.7 oz (83 kg) Height:  6' (182.9 cm)  BEHAVIORAL SYMPTOMS/MOOD NEUROLOGICAL BOWEL NUTRITION STATUS      Continent Diet (See DC summary)  AMBULATORY STATUS COMMUNICATION OF NEEDS Skin   Extensive Assist Verbally Surgical wounds (L Hip Incision)                       Personal Care Assistance Level of Assistance  Bathing, Feeding, Dressing Bathing Assistance: Maximum assistance Feeding assistance: Independent Dressing Assistance: Maximum assistance     Functional Limitations Info  Sight, Hearing,  Speech Sight Info: Adequate Hearing Info: Adequate Speech Info: Adequate    SPECIAL CARE FACTORS FREQUENCY  PT (By licensed PT), OT (By licensed OT)     PT Frequency: 5x week OT Frequency: 5x week            Contractures Contractures Info: Not present    Additional Factors Info  Code Status, Allergies, Psychotropic Code Status Info: Full Allergies Info: NKA Psychotropic Info: Bupropion, Fluoxetine         Current Medications (11/15/2022):  This is the current hospital active medication list Current Facility-Administered Medications  Medication Dose Route Frequency Provider Last Rate Last Admin   acetaminophen (TYLENOL) tablet 1,000 mg  1,000 mg Oral Q8H Gonfa, Taye T, MD   1,000 mg at 11/15/22 0700   apixaban (ELIQUIS) tablet 5 mg  5 mg Oral BID Marcellus Scott D, MD   5 mg at 11/15/22 0942   atorvastatin (LIPITOR) tablet 80 mg  80 mg Oral Daily Montez Morita, PA-C   80 mg at 11/15/22 0943   bethanechol (URECHOLINE) tablet 10 mg  10 mg Oral TID Candelaria Stagers T, MD   10 mg at 11/15/22 0943   buPROPion (WELLBUTRIN XL) 24 hr tablet 150 mg  150 mg Oral Daily Montez Morita, PA-C   150 mg at 11/15/22 0942   carvedilol (COREG) tablet 3.125 mg  3.125 mg Oral BID WC Candelaria Stagers T, MD   3.125 mg at 11/15/22 0942   cholecalciferol (VITAMIN D3) 25 MCG (1000 UNIT) tablet 1,000 Units  1,000  Units Oral Daily Elease Etienne, MD   1,000 Units at 11/15/22 6962   docusate sodium (COLACE) capsule 100 mg  100 mg Oral BID Montez Morita, PA-C   100 mg at 11/13/22 2248   ezetimibe (ZETIA) tablet 10 mg  10 mg Oral Daily Montez Morita, PA-C   10 mg at 11/15/22 9528   feeding supplement (ENSURE ENLIVE / ENSURE PLUS) liquid 237 mL  237 mL Oral TID BM Montez Morita, PA-C   237 mL at 11/15/22 0946   FLUoxetine (PROZAC) capsule 20 mg  20 mg Oral Daily Montez Morita, PA-C   20 mg at 11/15/22 0943   fluticasone (FLONASE) 50 MCG/ACT nasal spray 1 spray  1 spray Each Nare Daily Montez Morita, PA-C       hydrALAZINE  (APRESOLINE) injection 5 mg  5 mg Intravenous Q6H PRN Montez Morita, PA-C   5 mg at 11/12/22 2200   magnesium oxide (MAG-OX) tablet 400 mg  400 mg Oral Daily Montez Morita, PA-C   400 mg at 11/14/22 4132   menthol-cetylpyridinium (CEPACOL) lozenge 3 mg  1 lozenge Oral PRN Montez Morita, PA-C       Or   phenol (CHLORASEPTIC) mouth spray 1 spray  1 spray Mouth/Throat PRN Montez Morita, PA-C       metoCLOPramide (REGLAN) tablet 5-10 mg  5-10 mg Oral Q8H PRN Montez Morita, PA-C       Or   metoCLOPramide (REGLAN) injection 5-10 mg  5-10 mg Intravenous Q8H PRN Montez Morita, PA-C       multivitamin with minerals tablet 1 tablet  1 tablet Oral Daily Montez Morita, PA-C   1 tablet at 11/15/22 0943   ondansetron (ZOFRAN) tablet 4 mg  4 mg Oral Q6H PRN Montez Morita, PA-C       Or   ondansetron [facility]) injection 4 mg  4 mg Intravenous Q6H PRN Montez Morita, PA-C   4 mg at 11/14/22 4401   oxyCODONE (Oxy IR/ROXICODONE) immediate release tablet 5 mg  5 mg Oral Q8H PRN Candelaria Stagers T, MD   5 mg at 11/15/22 0941   tamsulosin (FLOMAX) capsule 0.4 mg  0.4 mg Oral Daily Elease Etienne, MD   0.4 mg at 11/15/22 0272     Discharge Medications: Please see discharge summary for a list of discharge medications.  Relevant Imaging Results:  Relevant Lab Results:   Additional Information SS# [id number] [place]

## 2022-11-15 NOTE — Progress Notes (Signed)
Nutrition Follow-up  DOCUMENTATION CODES:   Not applicable  INTERVENTION:  Continue Multivitamin w/ minerals daily Continue Ensure Enlive po TID, each supplement provides 350 kcal and 20 grams of protein. Encourage good PO intake  NUTRITION DIAGNOSIS:  Increased nutrient needs related to hip fracture as evidenced by estimated needs.  GOAL:  Patient will meet greater than or equal to 90% of their needs  MONITOR:  PO intake, I & O's, Labs  REASON FOR ASSESSMENT:  Consult Hip fracture protocol  ASSESSMENT:  88 y.o. male presented to the ED after a mechanical fall on L hip. PMH includes CAD, HLD, HTN, and A. Fib. Pt admitted with L femoral intertrochanter fracture.   6/02 - Admitted 6/03 - Op, L hip intramedullary nailing  Pt busy working with therapy at time of RD visit. RD received a consult due to reports of poor PO intake. No meal intake have been documented. Ensure supplements were ordered yesterday by Ortho PA - TID. Per MAR, pt accepted 3 yesterday and one thus far today, although unsure amount drank.    Per TOC note, pt likely to discharge tomorrow.   Medications reviewed: Vitamin D3, Colace, Magnesium Oxide, MVI Labs reviewed: Folate 6.5, Vitamin B12 635, Vitamin D 15.8, Hgb A1c 5.9  Diet Order:   Diet Order             Diet regular Room service appropriate? Yes; Fluid consistency: Thin  Diet effective now                  EDUCATION NEEDS:   Education needs have been addressed  Skin:  Skin Assessment: Reviewed RN Assessment  Last BM:  6/6 - Type 6, small  Height:  Ht Readings from Last 1 Encounters:  11/11/22 6' (1.829 m)   Weight:  Wt Readings from Last 1 Encounters:  11/11/22 83 kg   Ideal Body Weight:  80.9 kg  BMI:  Body mass index is 24.82 kg/m.  Estimated Nutritional Needs:  Kcal:  1950-2150 Protein:  100-120 grams Fluid:  >/= 1.9 L   Kirby Crigler RD, LDN Clinical Dietitian See [facility] for contact information.

## 2022-11-15 NOTE — Progress Notes (Signed)
PROGRESS NOTE  Luis [name] GNF:[id number] DOB: Feb 10, 1935   PCP: Ardith Dark, MD  Patient is from: Home  DOA: 11/11/2022 LOS: 4  Chief complaints Chief Complaint  Patient presents with   Fall    Eliquis      Brief Narrative / Interim history: 88 year old married male, independent, medical history significant for CAD, s/p inferior MI in 2001 with BMS x 2 and DES x 2 in 2013, last cath in 2016 showed nonobstructive and stable CAD, follows with Dr. Clifton James, HTN, HLD, RBBB, TIA, mild carotid disease, persistent A-fib on Eliquis, sustained a mechanical fall at home and admitted for left hip fracture and pain.  Orthopedics consulted.  S/p left hip IM nailing on femoral block by Dr. Carola Frost on 11/12/2022.  Medically stable for discharge to SNF pending insurance approval.  Subjective: Seen and examined earlier this morning.  No major events overnight of this morning.  Poor appetite but not new.  Taking Ensure.  Objective: Vitals:   11/14/22 1638 11/14/22 2024 11/15/22 0613 11/15/22 0843  BP: (!) 152/75 128/78 (!) 144/95 (!) 162/78  Pulse: (!) 104 81 83 96  Resp: 16   17  Temp: 97.8 F (36.6 C) 98.1 F (36.7 C) 98.1 F (36.7 C) 98 F (36.7 C)  TempSrc: Oral Oral  Oral  SpO2: 97% 99% 96% 98%  Weight:      Height:        Examination:  GENERAL: No apparent distress.  Sitting on bedside chair. HEENT: MMM.  Vision and hearing grossly intact.  NECK: Supple.  No apparent JVD.  RESP:  No IWOB.  Fair aeration bilaterally. CVS:  RRR. Heart sounds normal.  ABD/GI/GU: BS+. Abd soft, NTND.  MSK/EXT:  Moves extremities. No apparent deformity. No edema.  SKIN: Dressing over left hip DCI. NEURO: Sleepy but wakes to voice.  Oriented x 4.  No apparent focal neuro deficit. PSYCH: Calm. Normal affect.   Procedures:  Left hip IM nailing on femoral block by Dr. Carola Frost on 11/12/2022.   Microbiology summarized: MRSA PCR screen nonreactive.  Assessment and plan: Principal Problem:   Hip  fracture [facility]) Active Problems:   Atrial fibrillation [facility])   Coronary atherosclerosis   Closed displaced fracture of left femoral neck (HCC)  Mechanical fall at home Left intertrochanteric femoral fracture due to fall,  -S/p left hip IM nailing on femoral block by Dr. Carola Frost on 11/12/2022 -Scheduled Tylenol with as needed oxycodone for pain control -On Eliquis will serves VTE prophylaxis. -Bowel regimen as needed    Acute urinary retention: Had 700 mL on bladder scan and had a Foley catheter placed on 6/3.  Started on Flomax on 6/4.  Started on bethanechol on 6/5.  Passed voiding trial on 6/5.   -Continue Flomax and bethanechol -Strict intake and output. -Minimize opiates. -Follows with Dr. Bjorn Pippin, [facility], recommend outpatient follow-up.   Acute posthemorrhagic anemia: EBL about 200 cc?.  Hgb dropped from 14.5-9.1.  No obvious bleeding.  Anemia panel consistent with anemia of chronic disease. Recent Labs    03/05/22 0912 05/25/22 0850 11/11/22 1641 11/13/22 0039 11/14/22 0035 11/15/22 0317  HGB 15.3 15.5 14.5 11.0* 9.1* 9.2*  -Monitor H&H.  Transfuse for Hgb less than 8.0 given history of CAD -May have to hold anticoagulation if H&H continues to drop   Persistent A-fib: Rate controlled without meds -Continue Eliquis for anticoagulation -Started on Coreg partly due to blood pressure.   CAD s/p remote MI, and PCI: No cardiopulmonary symptoms.  Last LHC in 2016 with nonobstructive CAD. -Continue atorvastatin, Zetia.  Not on aspirin due to Eliquis. -Outpatient follow-up with cardiology   Essential hypertension: BP slightly elevated.  Does not seem to be on medication at home. -Increase Coreg to 6.25 mg twice daily -Pain control   Hyperlipidemia: -Continue atorvastatin and Zetia.   Anxiety and depression: Stable -Continue home dose of bupropion, Prozac.   Vitamin D deficiency: Vitamin D level 15.8. -Increase vitamin D to 2000 international unit daily    Hyperglycemia/prediabetes:: A1c 5.9%  Decreased oral intake/increased nutrient needs Body mass index is 24.82 kg/m. Nutrition Problem: Increased nutrient needs Etiology: hip fracture Signs/Symptoms: estimated needs Interventions: MVI -Consult dietitian   DVT prophylaxis:  SCDs Start: 11/12/22 2108 apixaban (ELIQUIS) tablet 5 mg  Code Status: Full code Family Communication: Updated patient's wife at bedside Level of care: Med-Surg Status is: Inpatient Remains inpatient appropriate because: Left hip fracture   Final disposition: SNF Consultants:  Orthopedic surgery  35 minutes with more than 50% spent in reviewing records, counseling patient/family and coordinating care.   Sch Meds:  Scheduled Meds:  acetaminophen  1,000 mg Oral Q8H   apixaban  5 mg Oral BID   atorvastatin  80 mg Oral Daily   bethanechol  10 mg Oral TID   buPROPion  150 mg Oral Daily   carvedilol  3.125 mg Oral BID WC   cholecalciferol  1,000 Units Oral Daily   docusate sodium  100 mg Oral BID   ezetimibe  10 mg Oral Daily   feeding supplement  237 mL Oral TID BM   FLUoxetine  20 mg Oral Daily   fluticasone  1 spray Each Nare Daily   magnesium oxide  400 mg Oral Daily   multivitamin with minerals  1 tablet Oral Daily   tamsulosin  0.4 mg Oral Daily   Continuous Infusions: PRN Meds:.hydrALAZINE, menthol-cetylpyridinium **OR** phenol, metoCLOPramide **OR** metoCLOPramide (REGLAN) injection, ondansetron **OR** ondansetron (ZOFRAN) IV, oxyCODONE  Antimicrobials: Anti-infectives (From admission, onward)    Start     Dose/Rate Route Frequency Ordered Stop   11/12/22 2200  ceFAZolin (ANCEF) IVPB 2g/100 mL premix        2 g 200 mL/hr over 30 Minutes Intravenous Every 6 hours 11/12/22 2107 11/13/22 0517   11/12/22 1145  ceFAZolin (ANCEF) IVPB 2g/100 mL premix        2 g 200 mL/hr over 30 Minutes Intravenous On call to O.R. 11/12/22 1048 11/12/22 1816        I have personally reviewed the  following labs and images: CBC: Recent Labs  Lab 11/11/22 1641 11/13/22 0039 11/14/22 0035 11/15/22 0317  WBC 7.1 9.9 10.7* 9.4  HGB 14.5 11.0* 9.1* 9.2*  HCT 42.8 33.0* 27.4* 26.5*  MCV 93.7 93.8 94.5 92.3  PLT 188 151 144* 169   BMP &GFR Recent Labs  Lab 11/11/22 1641 11/13/22 0039 11/14/22 0035  NA 136 136 134*  K 4.2 4.2 3.8  CL 105 103 101  CO2 22 24 26   GLUCOSE 144* 202* 110*  BUN 13 10 14   CREATININE 1.25* 1.11 0.98  CALCIUM 8.8* 8.5* 8.1*   Estimated Creatinine Clearance: 60.5 mL/min (by C-G formula based on SCr of 0.98 mg/dL). Liver & Pancreas: No results for input(s): "AST", "ALT", "ALKPHOS", "BILITOT", "PROT", "ALBUMIN" in the last 168 hours. No results for input(s): "LIPASE", "AMYLASE" in the last 168 hours. No results for input(s): "AMMONIA" in the last 168 hours. Diabetic: Recent Labs    11/13/22 0039  HGBA1C  5.9*   Recent Labs  Lab 11/14/22 1708 11/14/22 2024 11/15/22 0841 11/15/22 1153  GLUCAP 144* 160* 110* 153*   Cardiac Enzymes: No results for input(s): "CKTOTAL", "CKMB", "CKMBINDEX", "TROPONINI" in the last 168 hours. No results for input(s): "PROBNP" in the last 8760 hours. Coagulation Profile: No results for input(s): "INR", "PROTIME" in the last 168 hours. Thyroid Function Tests: No results for input(s): "TSH", "T4TOTAL", "FREET4", "T3FREE", "THYROIDAB" in the last 72 hours. Lipid Profile: No results for input(s): "CHOL", "HDL", "LDLCALC", "TRIG", "CHOLHDL", "LDLDIRECT" in the last 72 hours. Anemia Panel: Recent Labs    11/15/22 0317  VITAMINB12 635  FOLATE 6.5  FERRITIN 124  TIBC 202*  IRON 29*  RETICCTPCT 1.5   Urine analysis:    Component Value Date/Time   COLORURINE YELLOW 03/04/2017 1445   APPEARANCEUR CLEAR 03/04/2017 1445   LABSPEC <1.005 (L) 03/04/2017 1445   PHURINE 7.0 03/04/2017 1445   GLUCOSEU NEGATIVE 03/04/2017 1445   GLUCOSEU NEGATIVE 06/26/2011 0813   HGBUR NEGATIVE 03/04/2017 1445   BILIRUBINUR  NEGATIVE 03/04/2017 1445   KETONESUR NEGATIVE 03/04/2017 1445   PROTEINUR NEGATIVE 03/04/2017 1445   UROBILINOGEN 0.2 11/29/2014 1735   NITRITE NEGATIVE 03/04/2017 1445   LEUKOCYTESUR NEGATIVE 03/04/2017 1445   Sepsis Labs: Invalid input(s): "PROCALCITONIN", "LACTICIDVEN"  Microbiology: Recent Results (from the past 240 hour(s))  Surgical pcr screen     Status: None   Collection Time: 11/11/22 11:31 PM   Specimen: Nasal Mucosa; Nasal Swab  Result Value Ref Range Status   MRSA, PCR NEGATIVE NEGATIVE Final   Staphylococcus aureus NEGATIVE NEGATIVE Final    Comment: (NOTE) The Xpert SA Assay (FDA approved for NASAL specimens in patients 9 years of age and older), is one component of a comprehensive surveillance program. It is not intended to diagnose infection nor to guide or monitor treatment. Performed at [facility], [address] [place], [place] [address]     Radiology Studies: No results found.    Tinleigh Whitmire T. Ainsleigh Kakos [facility]  If 7PM-7AM, please contact night-coverage [email or web address] 11/15/2022, 3:15 PM

## 2022-11-16 DIAGNOSIS — S72002A Fracture of unspecified part of neck of left femur, initial encounter for closed fracture: Secondary | ICD-10-CM | POA: Diagnosis not present

## 2022-11-16 DIAGNOSIS — E785 Hyperlipidemia, unspecified: Secondary | ICD-10-CM | POA: Diagnosis not present

## 2022-11-16 DIAGNOSIS — I2510 Atherosclerotic heart disease of native coronary artery without angina pectoris: Secondary | ICD-10-CM | POA: Diagnosis not present

## 2022-11-16 DIAGNOSIS — Z8673 Personal history of transient ischemic attack (TIA), and cerebral infarction without residual deficits: Secondary | ICD-10-CM | POA: Diagnosis not present

## 2022-11-16 DIAGNOSIS — I10 Essential (primary) hypertension: Secondary | ICD-10-CM | POA: Diagnosis not present

## 2022-11-16 DIAGNOSIS — S72002D Fracture of unspecified part of neck of left femur, subsequent encounter for closed fracture with routine healing: Secondary | ICD-10-CM | POA: Diagnosis not present

## 2022-11-16 DIAGNOSIS — M25512 Pain in left shoulder: Secondary | ICD-10-CM | POA: Diagnosis not present

## 2022-11-16 DIAGNOSIS — R410 Disorientation, unspecified: Secondary | ICD-10-CM | POA: Diagnosis not present

## 2022-11-16 DIAGNOSIS — S72102D Unspecified trochanteric fracture of left femur, subsequent encounter for closed fracture with routine healing: Secondary | ICD-10-CM | POA: Diagnosis not present

## 2022-11-16 DIAGNOSIS — F339 Major depressive disorder, recurrent, unspecified: Secondary | ICD-10-CM | POA: Diagnosis not present

## 2022-11-16 DIAGNOSIS — C61 Malignant neoplasm of prostate: Secondary | ICD-10-CM | POA: Diagnosis not present

## 2022-11-16 DIAGNOSIS — F419 Anxiety disorder, unspecified: Secondary | ICD-10-CM | POA: Diagnosis not present

## 2022-11-16 DIAGNOSIS — R739 Hyperglycemia, unspecified: Secondary | ICD-10-CM | POA: Diagnosis not present

## 2022-11-16 DIAGNOSIS — I4819 Other persistent atrial fibrillation: Secondary | ICD-10-CM | POA: Diagnosis not present

## 2022-11-16 DIAGNOSIS — Z466 Encounter for fitting and adjustment of urinary device: Secondary | ICD-10-CM | POA: Diagnosis not present

## 2022-11-16 DIAGNOSIS — E559 Vitamin D deficiency, unspecified: Secondary | ICD-10-CM | POA: Diagnosis not present

## 2022-11-16 DIAGNOSIS — R7989 Other specified abnormal findings of blood chemistry: Secondary | ICD-10-CM | POA: Diagnosis not present

## 2022-11-16 DIAGNOSIS — R4182 Altered mental status, unspecified: Secondary | ICD-10-CM | POA: Diagnosis not present

## 2022-11-16 DIAGNOSIS — W19XXXD Unspecified fall, subsequent encounter: Secondary | ICD-10-CM | POA: Diagnosis not present

## 2022-11-16 DIAGNOSIS — Z743 Need for continuous supervision: Secondary | ICD-10-CM | POA: Diagnosis not present

## 2022-11-16 DIAGNOSIS — D62 Acute posthemorrhagic anemia: Secondary | ICD-10-CM | POA: Diagnosis not present

## 2022-11-16 DIAGNOSIS — S72142D Displaced intertrochanteric fracture of left femur, subsequent encounter for closed fracture with routine healing: Secondary | ICD-10-CM | POA: Diagnosis not present

## 2022-11-16 DIAGNOSIS — G4700 Insomnia, unspecified: Secondary | ICD-10-CM | POA: Diagnosis not present

## 2022-11-16 DIAGNOSIS — Z7401 Bed confinement status: Secondary | ICD-10-CM | POA: Diagnosis not present

## 2022-11-16 DIAGNOSIS — I469 Cardiac arrest, cause unspecified: Secondary | ICD-10-CM | POA: Diagnosis not present

## 2022-11-16 DIAGNOSIS — R339 Retention of urine, unspecified: Secondary | ICD-10-CM | POA: Diagnosis not present

## 2022-11-16 DIAGNOSIS — Z7901 Long term (current) use of anticoagulants: Secondary | ICD-10-CM | POA: Diagnosis not present

## 2022-11-16 DIAGNOSIS — I252 Old myocardial infarction: Secondary | ICD-10-CM | POA: Diagnosis not present

## 2022-11-16 DIAGNOSIS — M25562 Pain in left knee: Secondary | ICD-10-CM | POA: Diagnosis not present

## 2022-11-16 LAB — RENAL FUNCTION PANEL
Albumin: 2.6 g/dL — ABNORMAL LOW (ref 3.5–5.0)
Anion gap: 7 (ref 5–15)
BUN: 13 mg/dL (ref 8–23)
CO2: 25 mmol/L (ref 22–32)
Calcium: 8.4 mg/dL — ABNORMAL LOW (ref 8.9–10.3)
Chloride: 103 mmol/L (ref 98–111)
Creatinine, Ser: 0.81 mg/dL (ref 0.61–1.24)
GFR, Estimated: >60 mL/min (ref >60)
Glucose, Bld: 125 mg/dL — ABNORMAL HIGH (ref 70–99)
Phosphorus: 3.9 mg/dL (ref 2.5–4.6)
Potassium: 3.7 mmol/L (ref 3.5–5.1)
Sodium: 135 mmol/L (ref 135–145)

## 2022-11-16 LAB — HEMOGLOBIN AND HEMATOCRIT, BLOOD
HCT: 26.3 % — ABNORMAL LOW (ref 39.0–52.0)
Hemoglobin: 9 g/dL — ABNORMAL LOW (ref 13.0–17.0)

## 2022-11-16 LAB — GLUCOSE, CAPILLARY
Glucose-Capillary: 117 mg/dL — ABNORMAL HIGH (ref 70–99)
Glucose-Capillary: 171 mg/dL — ABNORMAL HIGH (ref 70–99)

## 2022-11-16 LAB — MAGNESIUM: Magnesium: 2 mg/dL (ref 1.7–2.4)

## 2022-11-16 MED ORDER — OXYCODONE HCL 5 MG PO TABS: 5.0000 mg | ORAL_TABLET | Freq: Three times a day (TID) | ORAL | 0 refills | Status: AC | PRN

## 2022-11-16 MED ORDER — VITAMIN D3 25 MCG PO TABS: 2000.0000 [IU] | ORAL_TABLET | Freq: Every day | ORAL | Status: DC

## 2022-11-16 MED ORDER — ACETAMINOPHEN 500 MG PO TABS: 1000.0000 mg | ORAL_TABLET | Freq: Three times a day (TID) | ORAL | 0 refills | Status: AC

## 2022-11-16 MED ORDER — ENSURE ENLIVE PO LIQD: 237.0000 mL | Freq: Three times a day (TID) | ORAL | 12 refills | Status: DC

## 2022-11-16 MED ORDER — CARVEDILOL 6.25 MG PO TABS: 6.2500 mg | ORAL_TABLET | Freq: Two times a day (BID) | ORAL | 1 refills | Status: DC

## 2022-11-16 MED ORDER — TAMSULOSIN HCL 0.4 MG PO CAPS: 0.4000 mg | ORAL_CAPSULE | Freq: Every day | ORAL | 2 refills | Status: DC

## 2022-11-16 MED ORDER — SENNOSIDES-DOCUSATE SODIUM 8.6-50 MG PO TABS: 1.0000 | ORAL_TABLET | Freq: Two times a day (BID) | ORAL | 0 refills | Status: DC | PRN

## 2022-11-16 NOTE — Progress Notes (Signed)
Berniece Salines Hamidi to be D/C'd  per MD order. Gave report to Clio at [place]. Discussed with the patient and all questions fully answered.  VSS, Skin clean, dry and intact without evidence of skin break down, no evidence of skin tears noted.  IV catheter discontinued intact. Site without signs and symptoms of complications. Dressing and pressure applied.  An After Visit Summary was printed and given to the PTAR.  Patient instructed to return to ED, call 911, or call MD for any changes in condition.

## 2022-11-16 NOTE — Discharge Instructions (Addendum)
[facility] Discharge Instructions   General Discharge Instructions  Orthopaedic Injuries:  Left hip fracture treated with intramedullary nailing   WEIGHT BEARING STATUS: weightbearing as tolerated left leg with assistance and walker   RANGE OF MOTION/ACTIVITY:unrestricted motion of left hip and knee. Activity as tolerated. Slowly increase activity level   Bone health: labs show vitamin d deficiency. Recommend vitamin d3 5000 IUs daily   Review the following resource for additional information regarding bone health  [email or web address]  Wound Care: daily wound care as needed to left hip. See below Discharge Wound Care Instructions  Do NOT apply any ointments, solutions or lotions to pin sites or surgical wounds.  These prevent needed drainage and even though solutions like hydrogen peroxide kill bacteria, they also damage cells lining the pin sites that help fight infection.  Applying lotions or ointments can keep the wounds moist and can cause them to breakdown and open up as well. This can increase the risk for infection. When in doubt call the office.  Surgical incisions should be dressed daily.  If any drainage is noted, use one layer of adaptic or Mepitel, then gauze, and tape . Alternatively you can use a silicone foam dressing such as a mepilex   [email or web address]  [email or web address]  These dressing supplies should be available at local medical supply stores ([facility], [facility], etc). They are not usually carried at places like [facility], [facility], [facility], etc  Once the incision is completely dry and without drainage, it may be left open to air out.  Showering may begin 36-48 hours later.   Cleaning gently with soap and water.   DVT/PE prophylaxis: eliquis   Diet: as you were eating previously.  Can use over the counter stool softeners and bowel preparations, such as Miralax, to help with bowel movements.  Narcotics can be constipating.  Be sure to drink plenty of fluids  PAIN MEDICATION USE AND EXPECTATIONS  You have likely been given narcotic medications to help control your pain.  After a traumatic event that results in an fracture (broken bone) with or without surgery, it is ok to use narcotic pain medications to help control one's pain.  We understand that everyone responds to pain differently and each individual patient will be evaluated on a regular basis for the continued need for narcotic medications. Ideally, narcotic medication use should last no more than 6-8 weeks (coinciding with fracture healing).   As a patient it is your responsibility as well to monitor narcotic medication use and report the amount and frequency you use these medications when you come to your office visit.   We would also advise that if you are using narcotic medications, you should take a dose prior to therapy to maximize you participation.  IF YOU ARE ON NARCOTIC MEDICATIONS IT IS NOT PERMISSIBLE TO OPERATE A MOTOR VEHICLE (MOTORCYCLE/CAR/TRUCK/MOPED) OR HEAVY MACHINERY DO NOT MIX NARCOTICS WITH OTHER CNS (CENTRAL NERVOUS SYSTEM) DEPRESSANTS SUCH AS ALCOHOL   POST-OPERATIVE OPIOID TAPER INSTRUCTIONS: It is important to wean off of your opioid medication as soon as possible. If you do not need pain medication after your surgery it is ok to stop day one. Opioids include: Codeine, Hydrocodone(Norco, Vicodin), Oxycodone(Percocet, oxycontin) and hydromorphone amongst others.  Long term and even short term use of opiods can cause: Increased pain response Dependence Constipation Depression Respiratory depression And more.  Withdrawal symptoms can include Flu like symptoms Nausea, vomiting And  more Techniques to manage these symptoms Hydrate well Eat regular healthy  meals Stay active Use relaxation techniques(deep breathing, meditating, yoga) Do Not substitute Alcohol to help with tapering If you have been on opioids for less than two weeks and do not have pain than it is ok to stop all together.  Plan to wean off of opioids This plan should start within one week post op of your fracture surgery  Maintain the same interval or time between taking each dose and first decrease the dose.  Cut the total daily intake of opioids by one tablet each day Next start to increase the time between doses. The last dose that should be eliminated is the evening dose.    STOP SMOKING OR USING NICOTINE PRODUCTS!!!!  As discussed nicotine severely impairs your body's ability to heal surgical and traumatic wounds but also impairs bone healing.  Wounds and bone heal by forming microscopic blood vessels (angiogenesis) and nicotine is a vasoconstrictor (essentially, shrinks blood vessels).  Therefore, if vasoconstriction occurs to these microscopic blood vessels they essentially disappear and are unable to deliver necessary nutrients to the healing tissue.  This is one modifiable factor that you can do to dramatically increase your chances of healing your injury.    (This means no smoking, no nicotine gum, patches, etc)  DO NOT USE NONSTEROIDAL ANTI-INFLAMMATORY DRUGS (NSAID'S)  Using products such as Advil (ibuprofen), Aleve (naproxen), Motrin (ibuprofen) for additional pain control during fracture healing can delay and/or prevent the healing response.  If you would like to take over the counter (OTC) medication, Tylenol (acetaminophen) is ok.  However, some narcotic medications that are given for pain control contain acetaminophen as well. Therefore, you should not exceed more than 4000 mg of tylenol in a day if you do not have liver disease.  Also note that there are may OTC medicines, such as cold  medicines and allergy medicines that my contain tylenol as well.  If you have any questions about medications and/or interactions please ask your doctor/PA or your pharmacist.      ICE AND ELEVATE INJURED/OPERATIVE EXTREMITY  Using ice and elevating the injured extremity above your heart can help with swelling and pain control.  Icing in a pulsatile fashion, such as 20 minutes on and 20 minutes off, can be followed.    Do not place ice directly on skin. Make sure there is a barrier between to skin and the ice pack.    Using frozen items such as frozen peas works well as the conform nicely to the are that needs to be iced.  USE AN ACE WRAP OR TED HOSE FOR SWELLING CONTROL  In addition to icing and elevation, Ace wraps or TED hose are used to help limit and resolve swelling.  It is recommended to use Ace wraps or TED hose until you are informed to stop.    When using Ace Wraps start the wrapping distally (farthest away from the body) and wrap proximally (closer to the body)   Example: If you had surgery on your leg or thing and you do not have a splint on, start the ace wrap at the toes and work your way up to the thigh        If you had surgery on your upper extremity and do not have a splint on, start the ace wrap at your fingers and work your way up to the upper arm  IF YOU ARE IN A SPLINT OR CAST DO NOT REMOVE IT FOR ANY REASON   If your splint gets wet for any  reason please contact the office immediately. You may shower in your splint or cast as long as you keep it dry.  This can be done by wrapping in a cast cover or garbage back (or similar)  Do Not stick any thing down your splint or cast such as pencils, money, or hangers to try and scratch yourself with.  If you feel itchy take benadryl as prescribed on the bottle for itching  IF YOU ARE IN A CAM BOOT (BLACK BOOT)  You may remove boot periodically. Perform daily dressing changes as noted below.  Wash the liner of the boot regularly and  wear a sock when wearing the boot. It is recommended that you sleep in the boot until told otherwise    Call office for the following: Temperature greater than 101F Persistent nausea and vomiting Severe uncontrolled pain Redness, tenderness, or signs of infection (pain, swelling, redness, odor or green/yellow discharge around the site) Difficulty breathing, headache or visual disturbances Hives Persistent dizziness or light-headedness Extreme fatigue Any other questions or concerns you may have after discharge  In an emergency, call 911 or go to an Emergency Department at a nearby hospital  HELPFUL INFORMATION  If you had a block, it will wear off between 8-24 hrs postop typically.  This is period when your pain may go from nearly zero to the pain you would have had postop without the block.  This is an abrupt transition but nothing dangerous is happening.  You may take an extra dose of narcotic when this happens.  You should wean off your narcotic medicines as soon as you are able.  Most patients will be off or using minimal narcotics before their first postop appointment.   We suggest you use the pain medication the first night prior to going to bed, in order to ease any pain when the anesthesia wears off. You should avoid taking pain medications on an empty stomach as it will make you nauseous.  Do not drink alcoholic beverages or take illicit drugs when taking pain medications.  In most states it is against the law to drive while you are in a splint or sling.  And certainly against the law to drive while taking narcotics.  You may return to work/school in the next couple of days when you feel up to it.   Pain medication may make you constipated.  Below are a few solutions to try in this order: Decrease the amount of pain medication if you aren't having pain. Drink lots of decaffeinated fluids. Drink prune juice and/or each dried prunes  If the first 3 don't work start with  additional solutions Take Colace - an over-the-counter stool softener Take Senokot - an over-the-counter laxative Take Miralax - a stronger over-the-counter laxative     CALL THE OFFICE WITH ANY QUESTIONS OR CONCERNS: [phone]   VISIT OUR WEBSITE FOR ADDITIONAL INFORMATION: [email or web address]

## 2022-11-16 NOTE — Discharge Summary (Signed)
Physician Discharge Summary  Luis [name] ZOX:[id number] DOB: 12-01-1934 DOA: 11/11/2022  PCP: Ardith Dark, MD  Admit date: 11/11/2022 Discharge date: 11/16/2022 Admitted From: Home Disposition:  SNF Recommendations for Outpatient Follow-up:  Follow up as below. Check CMP and CBC at follow-up Reassess blood pressure and adjust medication as appropriate Please follow up on the following pending results: None  Discharge Condition: Stable CODE STATUS: Full code  Contact information for follow-up providers     Bjorn Pippin, MD. Schedule an appointment as soon as possible for a visit in 3 week(s).   Specialty: Urology Contact information: [address] [place] [address] [phone]         Myrene Galas, MD. Schedule an appointment as soon as possible for a visit in 2 week(s).   Specialty: Orthopedic Surgery Contact information: [address] [place] [address] [phone]              Contact information for after-discharge care     Destination     [facility] .   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 Clifton James, HTN, HLD, RBBB, TIA, mild carotid disease, persistent A-fib on Eliquis, sustained a mechanical fall at home and admitted for left hip fracture and pain.  Orthopedics consulted.  S/p left hip IM nailing on femoral block by Dr. Carola Frost on 11/12/2022.  Hospital course complicated by postoperative acute blood loss anemia and acute urinary retention.  Hemoglobin dropped from 14.5-9.1 and remained stable after that.  No signs of obvious bleeding.  Urinary tension resolved and he passed voiding trial on 6/5.   Also started on Flomax.  Patient to follow-up with orthopedic surgery in about 2 weeks.  Also recommend follow-up with urology in 1 to 3 weeks.   Therapy recommended SNF  See individual problem list below for more.   Problems addressed during this hospitalization Principal Problem:   Hip fracture [facility]) Active Problems:   Atrial fibrillation [facility])   Coronary atherosclerosis   Closed displaced fracture of left femoral neck (HCC)   Mechanical fall at home Left intertrochanteric femoral fracture due to fall,  -S/p left hip IM nailing on femoral block by Dr. Carola Frost on 11/12/2022 -Scheduled Tylenol with as needed oxycodone for pain control -On Eliquis will serves VTE prophylaxis. -Bowel regimen as needed -Outpatient follow-up with orthopedic surgery as above     Acute urinary retention: Had 700 mL on bladder scan and had a Foley catheter placed on 6/3.  Started on Flomax on 6/4.  Started on bethanechol on 6/5.  Passed voiding trial on 6/5.   -Continue Flomax and discontinue bethanechol -Follows with Dr. Bjorn Pippin, [facility], recommend outpatient follow-up.   Acute posthemorrhagic anemia: EBL about 200 cc?.  Hgb dropped from 14.5-9.1.  No obvious bleeding.  Anemia panel consistent with anemia of chronic disease.  H&H stable after initial drop. Recent Labs    03/05/22 0912 05/25/22 0850 11/11/22 1641 11/13/22 0039 11/14/22 0035 11/15/22 0317 11/16/22 0916  HGB 15.3 15.5 14.5 11.0* 9.1* 9.2* 9.0*  -Check CBC in 1 week  Persistent A-fib: Rate controlled without meds -Continue  Eliquis for anticoagulation -Started on Coreg partly due to blood pressure.   CAD s/p remote MI, and PCI: No cardiopulmonary symptoms.  Last LHC in 2016 with nonobstructive CAD. -Continue atorvastatin, Zetia. -Added Coreg partly due to blood pressure.  Also history of A-fib -Outpatient follow-up with cardiology as previously planned   Essential hypertension: BP improved. -Continue Coreg 6.25 mg twice  daily   Hyperlipidemia: -Continue atorvastatin and Zetia.   Anxiety and depression: Stable -Continue home dose of bupropion, Prozac.   Vitamin D deficiency: Vitamin D level 15.8. -Continue vitamin D   Hyperglycemia/prediabetes: A1c 5.9%   Decreased oral intake/increased nutrient needs Nutrition Problem: Increased nutrient needs Etiology: hip fracture Signs/Symptoms: estimated needs Interventions: MVI     Time spent 35 minutes  Vital signs Vitals:   11/15/22 1822 11/15/22 2054 11/16/22 0446 11/16/22 0730  BP: (!) 140/75 112/66 (!) 146/67 139/81  Pulse: 87 69 79 80  Temp: 97.9 F (36.6 C) 98 F (36.7 C) 98.3 F (36.8 C) 98 F (36.7 C)  Resp: 17   16  Height:      Weight:      SpO2: 98% 100% 97% 97%  TempSrc: Oral Oral Oral Oral  BMI (Calculated):         Discharge exam  GENERAL: No apparent distress.  Nontoxic. HEENT: MMM.  Vision and hearing grossly intact.  NECK: Supple.  No apparent JVD.  RESP:  No IWOB.  Fair aeration bilaterally. CVS:  RRR. Heart sounds normal.  ABD/GI/GU: BS+. Abd soft, NTND.  MSK/EXT:  Moves extremities. No apparent deformity. No edema.  SKIN: Dressing over left thigh DCI. NEURO: Awake and alert. Oriented appropriately.  No apparent focal neuro deficit. PSYCH: Calm. Normal affect.   Discharge Instructions Discharge Instructions     Diet - low sodium heart healthy   Complete by: As directed    Increase activity slowly   Complete by: As directed       Allergies as of 11/16/2022   No Known Allergies      Medication List     STOP taking these medications    ketoconazole 2 % cream Commonly known as: NIZORAL   NON FORMULARY   POTASSIUM PO       TAKE these medications    acetaminophen 500 MG tablet Commonly known as: TYLENOL Take 2 tablets (1,000 mg total) by mouth every 8 (eight) hours for 5 days.   atorvastatin 80 MG tablet Commonly known as: LIPITOR TAKE ONE TABLET EACH DAY What changed:  how much to  take how to take this when to take this additional instructions   buPROPion 150 MG 24 hr tablet Commonly known as: WELLBUTRIN XL Take 150 mg by mouth daily.   carvedilol 6.25 MG tablet Commonly known as: COREG Take 1 tablet (6.25 mg total) by mouth 2 (two) times daily with a meal.   Eliquis 5 MG Tabs tablet Generic drug: apixaban TAKE ONE TABLET TWICE DAILY   ezetimibe 10 MG tablet Commonly known as: ZETIA TAKE ONE TABLET EACH DAY AT BEDTIME What changed: See the new instructions.   feeding supplement Liqd Take 237 mLs by mouth 3 (three) times daily between meals.   FLUoxetine 20 MG capsule Commonly known as: PROZAC Take 20 mg by mouth daily.   fluticasone 50 MCG/ACT nasal spray Commonly known as: FLONASE Place 1 spray into both nostrils in the morning.   Glucosamine HCl 1000 MG Tabs Take 2,000 mg by mouth daily.   Magnesium 500 MG Tabs Take 500  mg by mouth in the morning.   nitroGLYCERIN 0.4 MG SL tablet Commonly known as: NITROSTAT DISSOLVE 1 TABLET UNDER TONGUE AS NEEEDED FOR CHEST PAIN, MAY REPEAT IN 5 MINUTES FOR 2 DOSES What changed: See the new instructions.   oxyCODONE 5 MG immediate release tablet Commonly known as: Oxy IR/ROXICODONE Take 1 tablet (5 mg total) by mouth every 8 (eight) hours as needed for up to 5 days for severe pain.   senna-docusate 8.6-50 MG tablet Commonly known as: Senokot-S Take 1 tablet by mouth 2 (two) times daily between meals as needed for mild constipation.   tamsulosin 0.4 MG Caps capsule Commonly known as: FLOMAX Take 1 capsule (0.4 mg total) by mouth daily.   vitamin D3 25 MCG tablet Commonly known as: CHOLECALCIFEROL Take 2 tablets (2,000 Units total) by mouth daily.   vitamin E 1000 UNIT capsule Take 1,000 Units by mouth daily.        Consultations: Orthopedic surgery Urology over the phone  Procedures/Studies: Left hip IM nailing on femoral block by Dr. Carola Frost on 11/12/2022.    DG FEMUR PORT MIN 2 VIEWS  LEFT  Result Date: 11/12/2022 CLINICAL DATA:  Status post ORIF left femoral fracture EXAM: LEFT FEMUR PORTABLE 2 VIEWS COMPARISON:  Intraoperative films from earlier in the same day. FINDINGS: Medullary rod is now seen in the left femur with fixation screw. Slight displacement of the fracture fragments is noted. No new focal abnormality is seen. IMPRESSION: Status post ORIF proximal left femoral fracture Electronically Signed   By: Alcide Clever M.D.   On: 11/12/2022 22:42   DG FEMUR MIN 2 VIEWS LEFT  Result Date: 11/12/2022 CLINICAL DATA:  Left intratrochanteric femoral fracture EXAM: LEFT FEMUR 2 VIEWS COMPARISON:  None Available. FLUOROSCOPY TIME:  Radiation Exposure Index (as provided by the fluoroscopic device): 15.66 mGy If the device does not provide the exposure index: Fluoroscopy Time:  1 minute 1 second Number of Acquired Images:  8 FINDINGS: Initial images again demonstrate the intratrochanteric fracture. Medullary rod with fixation screw traversing the femoral neck is seen. Fracture fragments are in near anatomic alignment. IMPRESSION: ORIF of left femoral fracture. Electronically Signed   By: Alcide Clever M.D.   On: 11/12/2022 22:41   DG C-Arm 1-60 Min-No Report  Result Date: 11/12/2022 Fluoroscopy was utilized by the requesting physician.  No radiographic interpretation.   DG C-Arm 1-60 Min-No Report  Result Date: 11/12/2022 Fluoroscopy was utilized by the requesting physician.  No radiographic interpretation.   DG Knee Left Port  Result Date: 11/12/2022 CLINICAL DATA:  Hip fracture. EXAM: PORTABLE LEFT KNEE - 1-2 VIEW COMPARISON:  Left hip x-ray 11/11/2022 FINDINGS: There is a suprapatellar joint effusion. The bones are osteopenic. There is no acute fracture or dislocation identified. There is an exostosis from the lateral aspect of the distal femoral metadiaphysis measuring 5.2 by 0.9 by 1.6 cm. Peripheral vascular calcifications are present. IMPRESSION: 1. No acute fracture or  dislocation. 2. Suprapatellar joint effusion. 3. Exostosis from the lateral aspect of the distal femoral metadiaphysis. If there is point tenderness at this level, recommend further evaluation with MRI. Electronically Signed   By: Darliss Cheney M.D.   On: 11/12/2022 15:20   ECHOCARDIOGRAM COMPLETE  Result Date: 11/12/2022    ECHOCARDIOGRAM REPORT   Patient Name:   Luis [name] Date of Exam: 11/12/2022 Medical Rec #:  [id number]        Height:       72.0 in Accession #:  [id number]       Weight:       183.0 lb Date of Birth:  09-23-1934        BSA:          2.052 m Patient Age:    87 years         BP:           128/73 mmHg Patient Gender: M                HR:           99 bpm. Exam Location:  Inpatient Procedure: 2D Echo, Color Doppler and Cardiac Doppler Indications:    CAD Native Vessel  History:        Patient has prior history of Echocardiogram examinations, most                 recent 11/30/2014. CAD and Previous Myocardial Infarction, TIA,                 Arrythmias:Atrial Fibrillation and RBBB; Risk                 Factors:Hypertension and Dyslipidemia.  Sonographer:    Milda Smart Referring Phys: [id number] PING T ZHANG  Sonographer Comments: Suboptimal parasternal window. Image acquisition challenging due to respiratory motion and Image acquisition challenging due to patient body habitus. IMPRESSIONS  1. Left ventricular ejection fraction, by estimation, is 60 to 65%. The left ventricle has normal function. The left ventricle has no regional wall motion abnormalities. There is mild concentric left ventricular hypertrophy. Left ventricular diastolic function could not be evaluated.  2. Right ventricular systolic function is normal. The right ventricular size is normal.  3. Left atrial size was mild to moderately dilated.  4. The mitral valve is normal in structure. Trivial mitral valve regurgitation. No evidence of mitral stenosis.  5. The aortic valve is tricuspid. There is moderate calcification of  the aortic valve. Aortic valve regurgitation is mild. Aortic valve sclerosis/calcification is present, without any evidence of aortic stenosis. Aortic regurgitation PHT measures 691 msec.  6. Aortic dilatation noted. There is borderline dilatation of the ascending aorta, measuring 38 mm. FINDINGS  Left Ventricle: Left ventricular ejection fraction, by estimation, is 60 to 65%. The left ventricle has normal function. The left ventricle has no regional wall motion abnormalities. The left ventricular internal cavity size was normal in size. There is  mild concentric left ventricular hypertrophy. Left ventricular diastolic function could not be evaluated due to atrial fibrillation. Left ventricular diastolic function could not be evaluated. Right Ventricle: The right ventricular size is normal. No increase in right ventricular wall thickness. Right ventricular systolic function is normal. Left Atrium: Left atrial size was mild to moderately dilated. Right Atrium: Right atrial size was normal in size. Pericardium: There is no evidence of pericardial effusion. Mitral Valve: The mitral valve is normal in structure. Trivial mitral valve regurgitation. No evidence of mitral valve stenosis. Tricuspid Valve: The tricuspid valve is normal in structure. Tricuspid valve regurgitation is trivial. No evidence of tricuspid stenosis. Aortic Valve: The aortic valve is tricuspid. There is moderate calcification of the aortic valve. Aortic valve regurgitation is mild. Aortic regurgitation PHT measures 691 msec. Aortic valve sclerosis/calcification is present, without any evidence of aortic stenosis. Pulmonic Valve: The pulmonic valve was normal in structure. Pulmonic valve regurgitation is trivial. No evidence of pulmonic stenosis. Aorta: Aortic dilatation noted. There is borderline dilatation of the ascending aorta, measuring 38 mm. Venous: The inferior  vena cava was not well visualized. IAS/Shunts: No atrial level shunt detected by  color flow Doppler.  LEFT VENTRICLE PLAX 2D LVIDd:         3.70 cm     Diastology LVIDs:         2.80 cm     LV e' medial:    8.05 cm/s LV PW:         1.20 cm     LV E/e' medial:  11.8 LV IVS:        1.40 cm     LV e' lateral:   13.30 cm/s LVOT diam:     2.10 cm     LV E/e' lateral: 7.1 LV SV:         73 LV SV Index:   35 LVOT Area:     3.46 cm  LV Volumes (MOD) LV vol d, MOD A2C: 65.3 ml LV vol d, MOD A4C: 76.3 ml LV vol s, MOD A2C: 29.8 ml LV vol s, MOD A4C: 27.7 ml LV SV MOD A2C:     35.5 ml LV SV MOD A4C:     76.3 ml LV SV MOD BP:      41.7 ml RIGHT VENTRICLE RV Basal diam:  3.30 cm RV S prime:     12.90 cm/s TAPSE (M-mode): 1.5 cm LEFT ATRIUM             Index        RIGHT ATRIUM           Index LA diam:        5.00 cm 2.44 cm/m   RA Area:     17.80 cm LA Vol (A2C):   51.2 ml 24.96 ml/m  RA Volume:   42.50 ml  20.72 ml/m LA Vol (A4C):   56.4 ml 27.49 ml/m LA Biplane Vol: 56.0 ml 27.30 ml/m  AORTIC VALVE LVOT Vmax:   104.00 cm/s LVOT Vmean:  75.100 cm/s LVOT VTI:    0.210 m AI PHT:      691 msec  AORTA Ao Root diam: 3.70 cm Ao Asc diam:  3.80 cm MITRAL VALVE MV Area (PHT): 2.73 cm    SHUNTS MV Decel Time: 278 msec    Systemic VTI:  0.21 m MR Peak grad: 20.1 mmHg    Systemic Diam: 2.10 cm MR Vmax:      224.00 cm/s MV E velocity: 95.00 cm/s Arvilla Meres MD Electronically signed by Arvilla Meres MD Signature Date/Time: 11/12/2022/2:40:13 PM    Final    DG Hip Unilat W or Wo Pelvis 2-3 Views Left  Result Date: 11/11/2022 CLINICAL DATA:  Trauma, fall EXAM: DG HIP (WITH OR WITHOUT PELVIS) 2-3V LEFT COMPARISON:  None Available. FINDINGS: Acute comminuted intertrochanteric fracture is seen in the neck of left femur extending into the proximal shaft. There is a 19 mm offset in alignment of lateral cortical margins in the fracture. There is no dislocation. There is mild narrowing of lateral aspect of left hip joint space. Arterial calcifications are seen in soft tissues. IMPRESSION: Comminuted displaced  recent fracture is seen in the intertrochanteric portion of left femur extending into the proximal shaft. Electronically Signed   By: Ernie Avena M.D.   On: 11/11/2022 17:52   DG Shoulder Left  Result Date: 11/11/2022 CLINICAL DATA:  Trauma, fall EXAM: LEFT SHOULDER - 2+ VIEW COMPARISON:  None FINDINGS: No fracture or dislocation is seen. Small bony spurs seen in [facility] joint. Calcifications are seen in thoracic aorta.  IMPRESSION: No recent fracture or dislocation is seen in left shoulder. Degenerative changes are noted in left AC joint. Electronically Signed   By: Ernie Avena M.D.   On: 11/11/2022 17:50   DG Chest Portable 1 View  Result Date: 11/11/2022 CLINICAL DATA:  Fall EXAM: PORTABLE CHEST 1 VIEW COMPARISON:  Chest x-ray 03/04/2017 FINDINGS: The heart size and mediastinal contours are within normal limits. There are atherosclerotic calcifications of the aorta. Both lungs are clear. The visualized skeletal structures are unremarkable. IMPRESSION: No active disease. Electronically Signed   By: Darliss Cheney M.D.   On: 11/11/2022 17:49       The results of significant diagnostics from this hospitalization (including imaging, microbiology, ancillary and laboratory) are listed below for reference.     Microbiology: Recent Results (from the past 240 hour(s))  Surgical pcr screen     Status: None   Collection Time: 11/11/22 11:31 PM   Specimen: Nasal Mucosa; Nasal Swab  Result Value Ref Range Status   MRSA, PCR NEGATIVE NEGATIVE Final   Staphylococcus aureus NEGATIVE NEGATIVE Final    Comment: (NOTE) The Xpert SA Assay (FDA approved for NASAL specimens in patients 49 years of age and older), is one component of a comprehensive surveillance program. It is not intended to diagnose infection nor to guide or monitor treatment. Performed at [facility], [address], [place], [place] [address]      Labs:  CBC: Recent Labs  Lab 11/11/22 1641 11/13/22 0039  11/14/22 0035 11/15/22 0317  WBC 7.1 9.9 10.7* 9.4  HGB 14.5 11.0* 9.1* 9.2*  HCT 42.8 33.0* 27.4* 26.5*  MCV 93.7 93.8 94.5 92.3  PLT 188 151 144* 169   BMP &GFR Recent Labs  Lab 11/11/22 1641 11/13/22 0039 11/14/22 0035  NA 136 136 134*  K 4.2 4.2 3.8  CL 105 103 101  CO2 22 24 26   GLUCOSE 144* 202* 110*  BUN 13 10 14   CREATININE 1.25* 1.11 0.98  CALCIUM 8.8* 8.5* 8.1*   Estimated Creatinine Clearance: 60.5 mL/min (by C-G formula based on SCr of 0.98 mg/dL). Liver & Pancreas: No results for input(s): "AST", "ALT", "ALKPHOS", "BILITOT", "PROT", "ALBUMIN" in the last 168 hours. No results for input(s): "LIPASE", "AMYLASE" in the last 168 hours. No results for input(s): "AMMONIA" in the last 168 hours. Diabetic: No results for input(s): "HGBA1C" in the last 72 hours. Recent Labs  Lab 11/15/22 0841 11/15/22 1153 11/15/22 1818 11/15/22 2053 11/16/22 0829  GLUCAP 110* 153* 133* 130* 117*   Cardiac Enzymes: No results for input(s): "CKTOTAL", "CKMB", "CKMBINDEX", "TROPONINI" in the last 168 hours. No results for input(s): "PROBNP" in the last 8760 hours. Coagulation Profile: No results for input(s): "INR", "PROTIME" in the last 168 hours. Thyroid Function Tests: No results for input(s): "TSH", "T4TOTAL", "FREET4", "T3FREE", "THYROIDAB" in the last 72 hours. Lipid Profile: No results for input(s): "CHOL", "HDL", "LDLCALC", "TRIG", "CHOLHDL", "LDLDIRECT" in the last 72 hours. Anemia Panel: Recent Labs    11/15/22 0317  VITAMINB12 635  FOLATE 6.5  FERRITIN 124  TIBC 202*  IRON 29*  RETICCTPCT 1.5   Urine analysis:    Component Value Date/Time   COLORURINE YELLOW 03/04/2017 1445   APPEARANCEUR CLEAR 03/04/2017 1445   LABSPEC <1.005 (L) 03/04/2017 1445   PHURINE 7.0 03/04/2017 1445   GLUCOSEU NEGATIVE 03/04/2017 1445   GLUCOSEU NEGATIVE 06/26/2011 0813   HGBUR NEGATIVE 03/04/2017 1445   BILIRUBINUR NEGATIVE 03/04/2017 1445   KETONESUR NEGATIVE 03/04/2017  1445  PROTEINUR NEGATIVE 03/04/2017 1445   UROBILINOGEN 0.2 11/29/2014 1735   NITRITE NEGATIVE 03/04/2017 1445   LEUKOCYTESUR NEGATIVE 03/04/2017 1445   Sepsis Labs: Invalid input(s): "PROCALCITONIN", "LACTICIDVEN"   SIGNED:  Almon Hercules, MD  [facility] 11/16/2022, 9:37 AM

## 2022-11-16 NOTE — TOC Transition Note (Signed)
Transition of Care [facility]) - CM/SW Discharge Note   Patient Details  Name: Luis [name] MRN: [id number] Date of Birth: [birth year]  Transition of Care [facility] CM/SW Contact:  Carley Hammed, LCSW Phone Number: 11/16/2022, 9:30 AM   Clinical Narrative:    Pt to be transported to [facility] via [facility]. Nurse to call report to [phone]. Rm # [phone]   Final next level of care: Skilled Nursing Facility Barriers to Discharge: Barriers Resolved   Patient Goals and CMS Choice   Choice offered to / list presented to : Patient, Spouse  Discharge Placement                Patient [facility] bed at: [facility] Patient to be transferred to facility by: [facility] Name of family member notified: Darl Pikes Patient and family notified of of transfer: 11/16/22  Discharge Plan and Services Additional resources added to the After Visit Summary for                                       Social Determinants of Health (SDOH) Interventions SDOH Screenings   Food Insecurity: No Food Insecurity (04/05/2022)  Housing: Low Risk  (04/05/2022)  Transportation Needs: No Transportation Needs (04/05/2022)  Depression (PHQ2-9): Low Risk  (05/25/2022)  Financial Resource Strain: Low Risk  (04/05/2022)  Physical Activity: Sufficiently Active (04/05/2022)  Social Connections: Socially Integrated (04/05/2022)  Stress: No Stress Concern Present (04/05/2022)  Tobacco Use: Medium Risk (11/15/2022)     Readmission Risk Interventions     No data to display

## 2022-11-19 DIAGNOSIS — W19XXXD Unspecified fall, subsequent encounter: Secondary | ICD-10-CM | POA: Diagnosis not present

## 2022-11-19 DIAGNOSIS — R7989 Other specified abnormal findings of blood chemistry: Secondary | ICD-10-CM | POA: Diagnosis not present

## 2022-11-19 DIAGNOSIS — D62 Acute posthemorrhagic anemia: Secondary | ICD-10-CM | POA: Diagnosis not present

## 2022-11-19 DIAGNOSIS — R339 Retention of urine, unspecified: Secondary | ICD-10-CM | POA: Diagnosis not present

## 2022-11-19 DIAGNOSIS — S72002D Fracture of unspecified part of neck of left femur, subsequent encounter for closed fracture with routine healing: Secondary | ICD-10-CM | POA: Diagnosis not present

## 2022-11-28 DIAGNOSIS — M25562 Pain in left knee: Secondary | ICD-10-CM | POA: Diagnosis not present

## 2022-11-28 DIAGNOSIS — M25512 Pain in left shoulder: Secondary | ICD-10-CM | POA: Diagnosis not present

## 2022-11-28 DIAGNOSIS — S72142D Displaced intertrochanteric fracture of left femur, subsequent encounter for closed fracture with routine healing: Secondary | ICD-10-CM | POA: Diagnosis not present

## 2022-12-13 DIAGNOSIS — M25512 Pain in left shoulder: Secondary | ICD-10-CM | POA: Diagnosis not present

## 2022-12-14 ENCOUNTER — Other Ambulatory Visit: Payer: Self-pay | Admitting: Pharmacist

## 2022-12-14 NOTE — Progress Notes (Signed)
Pharmacy Quality Measure Review  This patient is appearing on a report for being at risk of failing the adherence measure for cholesterol (statin) medications this calendar year.   Medication: atorvastatin Last fill date: 11/09/2022 for 30 day supply  Patient fell at home 11/11/2022 and fractured hip. He is currently at [facility] for rehab. They are supplying medications. He anticipates the he will be there at least 1 more week.  Requested 90 DS of atorvastatin for cardiology and was sent 10/30/2022 but patient has not filled yet due to hospitalization and rehab.  Henrene Pastor, PharmD Clinical Pharmacist [facility]

## 2022-12-19 DIAGNOSIS — S72142D Displaced intertrochanteric fracture of left femur, subsequent encounter for closed fracture with routine healing: Secondary | ICD-10-CM | POA: Diagnosis not present

## 2022-12-19 DIAGNOSIS — M25562 Pain in left knee: Secondary | ICD-10-CM | POA: Diagnosis not present

## 2022-12-19 DIAGNOSIS — M25512 Pain in left shoulder: Secondary | ICD-10-CM | POA: Diagnosis not present

## 2023-01-07 ENCOUNTER — Telehealth: Payer: Self-pay | Admitting: Family Medicine

## 2023-01-07 NOTE — Telephone Encounter (Signed)
Ok with me. Please place any necessary orders. 

## 2023-01-07 NOTE — Telephone Encounter (Signed)
Ok to give VO? 

## 2023-01-07 NOTE — Telephone Encounter (Signed)
Call Kathlene November at [phone] VO given

## 2023-01-07 NOTE — Telephone Encounter (Signed)
Home Health Verbal Orders  Agency:  [facility]  Caller:  Arlyss Repress and title  Requesting OT/ PT/ Skilled nursing/ Social Work/ Speech:    Reason for Request:  Hip fracture - orders for [facility], possible hospital follow up  Frequency:    HH needs F2F w/in last 30 days     [phone]

## 2023-01-10 ENCOUNTER — Telehealth: Payer: Self-pay | Admitting: Family Medicine

## 2023-01-10 DIAGNOSIS — E785 Hyperlipidemia, unspecified: Secondary | ICD-10-CM | POA: Diagnosis not present

## 2023-01-10 DIAGNOSIS — S72142D Displaced intertrochanteric fracture of left femur, subsequent encounter for closed fracture with routine healing: Secondary | ICD-10-CM | POA: Diagnosis not present

## 2023-01-10 DIAGNOSIS — I4891 Unspecified atrial fibrillation: Secondary | ICD-10-CM | POA: Diagnosis not present

## 2023-01-10 DIAGNOSIS — F419 Anxiety disorder, unspecified: Secondary | ICD-10-CM | POA: Diagnosis not present

## 2023-01-10 DIAGNOSIS — M6281 Muscle weakness (generalized): Secondary | ICD-10-CM | POA: Diagnosis not present

## 2023-01-10 DIAGNOSIS — I10 Essential (primary) hypertension: Secondary | ICD-10-CM | POA: Diagnosis not present

## 2023-01-10 DIAGNOSIS — I252 Old myocardial infarction: Secondary | ICD-10-CM | POA: Diagnosis not present

## 2023-01-10 DIAGNOSIS — Z7901 Long term (current) use of anticoagulants: Secondary | ICD-10-CM | POA: Diagnosis not present

## 2023-01-10 DIAGNOSIS — I2510 Atherosclerotic heart disease of native coronary artery without angina pectoris: Secondary | ICD-10-CM | POA: Diagnosis not present

## 2023-01-10 DIAGNOSIS — R739 Hyperglycemia, unspecified: Secondary | ICD-10-CM | POA: Diagnosis not present

## 2023-01-10 NOTE — Telephone Encounter (Signed)
Home Health Verbal Orders  Agency:  [facility]  Caller: Alveda Reasons and title  Requesting OT/ PT/ Skilled nursing/ Social Work/ Speech:    Reason for Request:  PT start of care - order for PT 3 times a week for 1 week - stating next week, then, 2 times a week for 2 weeks then, every other week  Frequency:    HH needs F2F w/in last 30 days      [phone] Can leave message

## 2023-01-11 NOTE — Telephone Encounter (Signed)
Ok with me. Please place any necessary orders. 

## 2023-01-11 NOTE — Telephone Encounter (Signed)
Ok to give VO? 

## 2023-01-14 ENCOUNTER — Telehealth: Payer: Self-pay | Admitting: Family Medicine

## 2023-01-14 NOTE — Telephone Encounter (Signed)
LVM to Vernona Rieger at [phone] Per Dr Jimmey Ralph ok with PT 3x per week for 1 wk then 2x per wk for 2 wk then every other week

## 2023-01-14 NOTE — Telephone Encounter (Signed)
[name] OT with [facility] requests to be called at ph#  [phone]  Plan of Care approval

## 2023-01-15 NOTE — Telephone Encounter (Signed)
Left message to Iona Coach at [phone] to return call to our office at their convenience.

## 2023-01-16 DIAGNOSIS — M25562 Pain in left knee: Secondary | ICD-10-CM | POA: Diagnosis not present

## 2023-01-16 DIAGNOSIS — S72142D Displaced intertrochanteric fracture of left femur, subsequent encounter for closed fracture with routine healing: Secondary | ICD-10-CM | POA: Diagnosis not present

## 2023-01-25 ENCOUNTER — Other Ambulatory Visit: Payer: Self-pay | Admitting: Orthopedic Surgery

## 2023-01-25 DIAGNOSIS — M2392 Unspecified internal derangement of left knee: Secondary | ICD-10-CM

## 2023-01-26 ENCOUNTER — Other Ambulatory Visit: Payer: Self-pay | Admitting: Orthopedic Surgery

## 2023-01-26 DIAGNOSIS — M25512 Pain in left shoulder: Secondary | ICD-10-CM

## 2023-01-26 DIAGNOSIS — S46002A Unspecified injury of muscle(s) and tendon(s) of the rotator cuff of left shoulder, initial encounter: Secondary | ICD-10-CM

## 2023-01-28 ENCOUNTER — Other Ambulatory Visit: Payer: Medicare HMO

## 2023-01-29 ENCOUNTER — Ambulatory Visit
Admission: RE | Admit: 2023-01-29 | Discharge: 2023-01-29 | Disposition: A | Discharge status: Home / Self Care | Payer: Medicare HMO | Source: Ambulatory Visit | Attending: Orthopedic Surgery | Admitting: Orthopedic Surgery

## 2023-01-29 DIAGNOSIS — S83272A Complex tear of lateral meniscus, current injury, left knee, initial encounter: Secondary | ICD-10-CM | POA: Diagnosis not present

## 2023-01-29 DIAGNOSIS — M25562 Pain in left knee: Secondary | ICD-10-CM | POA: Diagnosis not present

## 2023-01-29 DIAGNOSIS — M2392 Unspecified internal derangement of left knee: Secondary | ICD-10-CM

## 2023-01-30 DIAGNOSIS — M25562 Pain in left knee: Secondary | ICD-10-CM | POA: Diagnosis not present

## 2023-01-30 DIAGNOSIS — S72142D Displaced intertrochanteric fracture of left femur, subsequent encounter for closed fracture with routine healing: Secondary | ICD-10-CM | POA: Diagnosis not present

## 2023-02-01 DIAGNOSIS — M6281 Muscle weakness (generalized): Secondary | ICD-10-CM | POA: Diagnosis not present

## 2023-02-01 DIAGNOSIS — M25512 Pain in left shoulder: Secondary | ICD-10-CM | POA: Diagnosis not present

## 2023-02-01 DIAGNOSIS — R2681 Unsteadiness on feet: Secondary | ICD-10-CM | POA: Diagnosis not present

## 2023-02-01 DIAGNOSIS — M25552 Pain in left hip: Secondary | ICD-10-CM | POA: Diagnosis not present

## 2023-02-01 DIAGNOSIS — Z4781 Encounter for orthopedic aftercare following surgical amputation: Secondary | ICD-10-CM | POA: Diagnosis not present

## 2023-02-04 ENCOUNTER — Telehealth: Payer: Self-pay | Admitting: Family Medicine

## 2023-02-04 DIAGNOSIS — M25552 Pain in left hip: Secondary | ICD-10-CM | POA: Diagnosis not present

## 2023-02-04 DIAGNOSIS — Z4781 Encounter for orthopedic aftercare following surgical amputation: Secondary | ICD-10-CM | POA: Diagnosis not present

## 2023-02-04 DIAGNOSIS — M6281 Muscle weakness (generalized): Secondary | ICD-10-CM | POA: Diagnosis not present

## 2023-02-04 DIAGNOSIS — M25512 Pain in left shoulder: Secondary | ICD-10-CM | POA: Diagnosis not present

## 2023-02-04 DIAGNOSIS — R2681 Unsteadiness on feet: Secondary | ICD-10-CM | POA: Diagnosis not present

## 2023-02-04 NOTE — Telephone Encounter (Signed)
Patient dropped off document Home Health Certificate (Order ID [id number]), to be filled out by provider. Patient requested to send it back via Fax within 5-days. Document is located in providers tray at front office.Please advise

## 2023-02-05 NOTE — Telephone Encounter (Signed)
Form placed to be review in PCP office

## 2023-02-06 DIAGNOSIS — Z4781 Encounter for orthopedic aftercare following surgical amputation: Secondary | ICD-10-CM | POA: Diagnosis not present

## 2023-02-06 DIAGNOSIS — M25512 Pain in left shoulder: Secondary | ICD-10-CM | POA: Diagnosis not present

## 2023-02-06 DIAGNOSIS — R2681 Unsteadiness on feet: Secondary | ICD-10-CM | POA: Diagnosis not present

## 2023-02-06 DIAGNOSIS — M6281 Muscle weakness (generalized): Secondary | ICD-10-CM | POA: Diagnosis not present

## 2023-02-06 DIAGNOSIS — M25552 Pain in left hip: Secondary | ICD-10-CM | POA: Diagnosis not present

## 2023-02-08 DIAGNOSIS — Z4781 Encounter for orthopedic aftercare following surgical amputation: Secondary | ICD-10-CM | POA: Diagnosis not present

## 2023-02-08 DIAGNOSIS — R2681 Unsteadiness on feet: Secondary | ICD-10-CM | POA: Diagnosis not present

## 2023-02-08 DIAGNOSIS — F419 Anxiety disorder, unspecified: Secondary | ICD-10-CM | POA: Diagnosis not present

## 2023-02-08 DIAGNOSIS — S72102D Unspecified trochanteric fracture of left femur, subsequent encounter for closed fracture with routine healing: Secondary | ICD-10-CM | POA: Diagnosis not present

## 2023-02-08 DIAGNOSIS — M6281 Muscle weakness (generalized): Secondary | ICD-10-CM | POA: Diagnosis not present

## 2023-02-08 DIAGNOSIS — I10 Essential (primary) hypertension: Secondary | ICD-10-CM | POA: Diagnosis not present

## 2023-02-08 DIAGNOSIS — R339 Retention of urine, unspecified: Secondary | ICD-10-CM | POA: Diagnosis not present

## 2023-02-08 DIAGNOSIS — M25552 Pain in left hip: Secondary | ICD-10-CM | POA: Diagnosis not present

## 2023-02-08 DIAGNOSIS — M25512 Pain in left shoulder: Secondary | ICD-10-CM | POA: Diagnosis not present

## 2023-02-08 NOTE — Telephone Encounter (Signed)
Form faxed to [phone] Form placed to be scan in patient chart

## 2023-02-13 DIAGNOSIS — R2681 Unsteadiness on feet: Secondary | ICD-10-CM | POA: Diagnosis not present

## 2023-02-13 DIAGNOSIS — M6281 Muscle weakness (generalized): Secondary | ICD-10-CM | POA: Diagnosis not present

## 2023-02-13 DIAGNOSIS — M25552 Pain in left hip: Secondary | ICD-10-CM | POA: Diagnosis not present

## 2023-02-13 DIAGNOSIS — Z4781 Encounter for orthopedic aftercare following surgical amputation: Secondary | ICD-10-CM | POA: Diagnosis not present

## 2023-02-13 DIAGNOSIS — M25512 Pain in left shoulder: Secondary | ICD-10-CM | POA: Diagnosis not present

## 2023-02-15 DIAGNOSIS — M25512 Pain in left shoulder: Secondary | ICD-10-CM | POA: Diagnosis not present

## 2023-02-15 DIAGNOSIS — M6281 Muscle weakness (generalized): Secondary | ICD-10-CM | POA: Diagnosis not present

## 2023-02-15 DIAGNOSIS — M25552 Pain in left hip: Secondary | ICD-10-CM | POA: Diagnosis not present

## 2023-02-15 DIAGNOSIS — R2681 Unsteadiness on feet: Secondary | ICD-10-CM | POA: Diagnosis not present

## 2023-02-18 DIAGNOSIS — M6281 Muscle weakness (generalized): Secondary | ICD-10-CM | POA: Diagnosis not present

## 2023-02-18 DIAGNOSIS — M25512 Pain in left shoulder: Secondary | ICD-10-CM | POA: Diagnosis not present

## 2023-02-18 DIAGNOSIS — M25552 Pain in left hip: Secondary | ICD-10-CM | POA: Diagnosis not present

## 2023-02-18 DIAGNOSIS — R2681 Unsteadiness on feet: Secondary | ICD-10-CM | POA: Diagnosis not present

## 2023-02-20 ENCOUNTER — Other Ambulatory Visit: Payer: Self-pay

## 2023-02-20 ENCOUNTER — Other Ambulatory Visit: Payer: Medicare HMO

## 2023-02-20 ENCOUNTER — Encounter (HOSPITAL_COMMUNITY): Payer: Self-pay | Admitting: Orthopedic Surgery

## 2023-02-20 NOTE — Anesthesia Preprocedure Evaluation (Addendum)
Anesthesia Evaluation  Patient identified by MRN, date of birth, ID band Patient awake    Reviewed: Allergy & Precautions, NPO status , Patient's Chart, lab work & pertinent test results  History of Anesthesia Complications Negative for: history of anesthetic complications  Airway Mallampati: II  TM Distance: >3 FB Neck ROM: Full    Dental no notable dental hx.    Pulmonary former smoker   Pulmonary exam normal        Cardiovascular hypertension, + CAD, + Past MI (2001) and + Cardiac Stents (2001, 2013)  Normal cardiovascular exam+ dysrhythmias (on Eliquis) Atrial Fibrillation      Neuro/Psych    Depression    TIA   GI/Hepatic negative GI ROS, Neg liver ROS,,,  Endo/Other  negative endocrine ROS    Renal/GU negative Renal ROS     Musculoskeletal  (+) Arthritis ,  MEDIAL MENISCUS TEAR LEFT KNEE   Abdominal   Peds  Hematology negative hematology ROS (+)   Anesthesia Other Findings Day of surgery medications reviewed with patient.  Reproductive/Obstetrics                             Anesthesia Physical Anesthesia Plan  ASA: 3  Anesthesia Plan: General   Post-op Pain Management: Tylenol PO (pre-op)*   Induction: Intravenous  PONV Risk Score and Plan: 2 and Treatment may vary due to age or medical condition, Ondansetron and Dexamethasone  Airway Management Planned: LMA  Additional Equipment: None  Intra-op Plan:   Post-operative Plan: Extubation in OR  Informed Consent: I have reviewed the patients History and Physical, chart, labs and discussed the procedure including the risks, benefits and alternatives for the proposed anesthesia with the patient or authorized representative who has indicated his/her understanding and acceptance.     Dental advisory given  Plan Discussed with: CRNA  Anesthesia Plan Comments: (PAT note written 02/20/2023 by Shonna Chock, PA-C.  )        Anesthesia Quick Evaluation

## 2023-02-20 NOTE — H&P (Incomplete)
Orthopaedic Trauma Service (OTS) Consult   Patient ID: Luis [name] MRN: [id number] DOB/AGE: 1934/11/26 88 y.o.   HPI: Luis [name] is an 88 y.o. male s/p   Past Medical History:  Diagnosis Date   Anal abscess    CAD (coronary artery disease)    a. BMS-RCA, BMS-Cx, PTCA of OM1 in 2001. b. overlapping DES in RCA and PTCA to small distal Cx 2013. c. Cath 2016 -> stable CAD, suspect dyspnea unrelated to coronary disease.   COLONIC POLYPS, HX OF    Dr Kinnie Scales   DJD (degenerative joint disease)    Essential hypertension    Hyperlipidemia    Myocardial infarction (HCC) 08/1999   Persistent atrial fibrillation (HCC)    a. dx 2016.   RBBB    TIA (transient ischemic attack)     Past Surgical History:  Procedure Laterality Date   CARDIAC CATHETERIZATION  ~ 2002   CARDIAC CATHETERIZATION N/A 03/02/2015   Procedure: Left Heart Cath and Coronary Angiography;  Surgeon: Tonny Bollman, MD;  Location: [facility] INVASIVE CV LAB;  Service: Cardiovascular;  Laterality: N/A;   CATARACT EXTRACTION W/ INTRAOCULAR LENS  IMPLANT, BILATERAL  ~ 2007   bilaterally   CORONARY ANGIOPLASTY  07/30/11   CORONARY ANGIOPLASTY WITH STENT PLACEMENT  2001; 07/30/11;   "2+3"   FEMUR IM NAIL Left 11/12/2022   Procedure: INTRAMEDULLARY (IM) NAIL HIP FRACTURE;  Surgeon: Myrene Galas, MD;  Location: [facility] OR;  Service: Orthopedics;  Laterality: Left;   INGUINAL HERNIA REPAIR  2005   left   LEFT HEART CATHETERIZATION WITH CORONARY ANGIOGRAM N/A 07/30/2011   Procedure: LEFT HEART CATHETERIZATION WITH CORONARY ANGIOGRAM;  Surgeon: Kathleene Hazel, MD;  Location: [facility] CATH LAB;  Service: Cardiovascular;  Laterality: N/A;   PERCUTANEOUS CORONARY STENT INTERVENTION (PCI-S)  07/30/2011   Procedure: PERCUTANEOUS CORONARY STENT INTERVENTION (PCI-S);  Surgeon: Kathleene Hazel, MD;  Location: [facility] CATH LAB;  Service: Cardiovascular;;   RETINAL DETACHMENT SURGERY  05/2008; 06/2008; 07/2008   left; "all 3  surgeries have failed"    Family History  Problem Relation Age of Onset   Alcohol abuse Mother    Colon cancer Father        in 19s   Alcoholism Sister     Social History:  reports that he quit smoking about 23 years ago. His smoking use included cigarettes. He started smoking about 63 years ago. He has a 20 pack-year smoking history. He has never used smokeless tobacco. He reports that he does not drink alcohol and does not use drugs.  Allergies: No Known Allergies  Medications: {medication reviewed/display:3041432}  No results found for this or any previous visit (from the past 48 hour(s)).  No results found.  Intake/Output    None      ROS There were no vitals taken for this visit. Physical Exam Ortho Exam         Gait: *** Coordination and balance: ***   Assessment/Plan:  ***  -(ortho injury):  - Pain management:  *** - ABL anemia/Hemodynamics  *** - Medical issues   *** - DVT/PE prophylaxis:  *** - ID:   *** - Metabolic Bone Disease:  *** - Activity:  *** - FEN/GI prophylaxis/Foley/Lines:  *** -Ex-fix/Splint care:  *** - Impediments to fracture healing:  *** - Dispo:  ***  {ORTHOADMISSIONSTATUS:21269}  Weightbearing: {weightbearing/status:21254} {affected extremity :21255} Insicional and dressing care: {Insicional and dressing care:21256} Orthopedic device(s): {CHL orthopedic devices:21267::"None"} Showering: *** VTE prophylaxis: {  Type:21257} {Duration:21258} Pain control: *** Follow - up plan: {Follow-up plan:21259} Contact information:  *** MD, *** PA   Mearl Latin, PA-C [phone] (C) 02/20/2023, 12:20 PM  [facility] [address] [place] [address] [phone] [phone] (F)    After 5pm and on the weekends please log on to Amion, go to orthopaedics and the look under the [facility] Call for the provider(s) on call. You can also call our office at [phone] and then  follow the prompts to be connected to the call team.

## 2023-02-20 NOTE — Progress Notes (Signed)
Anesthesia Chart Review: Luis [name]  Case: [id number] Date/Time: 02/21/23 1015   Procedure: ARTHROSCOPY KNEE (Left: Knee)   Anesthesia type: Choice   Pre-op diagnosis: MEDIAL MENISCUS TEAR LEFT KNEE   [facility] ROOM 03 / [facility] OR   Surgeons: Myrene Galas, MD       DISCUSSION: Patient is an 88 year old male scheduled for the above procedure. He sustained a mechanical fall with left hip fracture on 11/11/22. S/p IM nailing 11/12/22. Postoperative course notable for post-operative blood anemia (ELB ~ 200 mL; HGB 14.5->9.0) and urinary retention. No obvious post-operative bleeding and anemia panel consistent with underlying anemia of chronic disease. He passed voiding trial following foley removal. Discharged on Flomax with urology follow-up. Recent left knee MRI showed a medial meniscus tear.   Other history includes former smoker, HTN, HLD, CAD (MI, s/p pRCA stent 09/03/99 & stent mLCX and PTCA OM1 09/05/99; DES pRCA, DES dRCA, & PTCA mCX 07/30/11), atrial fibrillation, right bundle branch block, TIA, retinal detachment (s/p repair 2009, 2010).  Last cardiology follow-up was on 03/05/22 with Robin Searing, NP for follow-up CAD, afib. He was asymptomatic from a CV standpoint. Continue GDMT with Lipitor, Zetia. No on ASA due to Eliquis for afib. If HTN stable, consider discontinuing amlodipine. Updated carotid US ordered which showed 1-39% BICA stenosis on 03/15/22. One year follow-up with Dr. Clifton James or APP planned. Hospitalist did order a preoperative echo in June 2024 that showed the EF 60 to 65%, no regional wall motion abnormalities, mild concentric LVH, normal RV systolic function, mild to moderately dilated LA, trivial MR, mild AR, 38 mm ascending aorta.  He has a same-day workup.  Anesthesia team to evaluate on the day of surgery.  He reported instructions to hold Eliquis starting 02/20/23.      VS:  BP Readings from Last 3 Encounters:  11/16/22 (!) 146/86  09/12/22 130/62  07/25/22 138/80    Pulse Readings from Last 3 Encounters:  11/16/22 67  09/12/22 65  07/25/22 60     PROVIDERS: Ardith Dark, MD is PCP Verne Carrow, MD is cardiologist Bjorn Pippin, MD is urologist Yancey Flemings, MD is GI   LABS: Most recent labs in [facility] include: Lab Results  Component Value Date   WBC 9.4 11/15/2022   HGB 9.0 (L) 11/16/2022   HCT 26.3 (L) 11/16/2022   PLT 169 11/15/2022   GLUCOSE 125 (H) 11/16/2022   NA 135 11/16/2022   K 3.7 11/16/2022   CL 103 11/16/2022   CREATININE 0.81 11/16/2022   BUN 13 11/16/2022   CO2 25 11/16/2022   HGBA1C 5.9 (H) 11/13/2022     IMAGES: MRI left knee 01/29/2023: IMPRESSION: 1. Degeneration complex tear of the posterior horn of the medial meniscus with a small peripheral 4 mm parameniscal cyst. Partial radial tear of the body of the medial meniscus. 2. Tricompartmental cartilage abnormalities as described above most severe in the medial femorotibial compartment.    EKG: 11/11/22.  Atrial fibrillation at 79 bpm Right bundle branch block   CV: Echo 11/12/2022: IMPRESSIONS   1. Left ventricular ejection fraction, by estimation, is 60 to 65%. The  left ventricle has normal function. The left ventricle has no regional  wall motion abnormalities. There is mild concentric left ventricular  hypertrophy. Left ventricular diastolic  function could not be evaluated.   2. Right ventricular systolic function is normal. The right ventricular  size is normal.   3. Left atrial size was mild to moderately dilated.  4. The mitral valve is normal in structure. Trivial mitral valve  regurgitation. No evidence of mitral stenosis.   5. The aortic valve is tricuspid. There is moderate calcification of the  aortic valve. Aortic valve regurgitation is mild. Aortic valve  sclerosis/calcification is present, without any evidence of aortic  stenosis. Aortic regurgitation PHT measures 691  msec.   6. Aortic dilatation noted. There is borderline  dilatation of the  ascending aorta, measuring 38 mm.    US Carotid 03/15/2022: Summary:  Right Carotid: Velocities in the right ICA are consistent with a 1-39%  stenosis.  Left Carotid: Velocities in the left ICA are consistent with a 1-39%  stenosis.  Vertebrals: Bilateral vertebral arteries demonstrate antegrade flow. Left               vertebral artery demonstrates high resistant flow.  Subclavians: Normal flow hemodynamics were seen in bilateral subclavian               arteries.    LHC 03/07/2015: Prox RCA lesion, 20% stenosed. A drug-eluting stent was placed. The lesion was previously treated with a drug-eluting stent . Dist RCA lesion, 10% stenosed. A drug-eluting stent was placed. The lesion was previously treated with a drug-eluting stent . Mid RCA lesion, 30% stenosed. Mid Cx lesion, 50% stenosed. The lesion was previously treated with a stent (unknown type) . 3rd Mrg lesion, 90% stenosed. 2nd Mrg lesion, 70% stenosed. Prox LAD lesion, 30% stenosed. Mid LAD lesion, 30% stenosed. The left ventricular systolic function is normal.   1. Stable CAD with continued patency of the stented segments in the RCA, mild stenosis of the LCx stent, Mild diffuse nonobstructive stenosis of the LAD, and severe distal stenosis of small OM subranches 2. Normal LV function   Recommendation: continued medical therapy for stable CAD. Suspect dyspnea is unrelated to coronary obstruction.   Past Medical History:  Diagnosis Date   Anal abscess    CAD (coronary artery disease)    a. BMS-RCA, BMS-Cx, PTCA of OM1 in 2001. b. overlapping DES in RCA and PTCA to small distal Cx 2013. c. Cath 2016 -> stable CAD, suspect dyspnea unrelated to coronary disease.   COLONIC POLYPS, HX OF    Dr Kinnie Scales   DJD (degenerative joint disease)    Essential hypertension    Hyperlipidemia    Myocardial infarction (HCC) 08/1999   Persistent atrial fibrillation (HCC)    a. dx 2016.   RBBB    TIA (transient  ischemic attack)     Past Surgical History:  Procedure Laterality Date   CARDIAC CATHETERIZATION  ~ 2002   CARDIAC CATHETERIZATION N/A 03/02/2015   Procedure: Left Heart Cath and Coronary Angiography;  Surgeon: Tonny Bollman, MD;  Location: [facility] INVASIVE CV LAB;  Service: Cardiovascular;  Laterality: N/A;   CATARACT EXTRACTION W/ INTRAOCULAR LENS  IMPLANT, BILATERAL  ~ 2007   bilaterally   CORONARY ANGIOPLASTY  07/30/11   CORONARY ANGIOPLASTY WITH STENT PLACEMENT  2001; 07/30/11;   "2+3"   FEMUR IM NAIL Left 11/12/2022   Procedure: INTRAMEDULLARY (IM) NAIL HIP FRACTURE;  Surgeon: Myrene Galas, MD;  Location: [facility] OR;  Service: Orthopedics;  Laterality: Left;   INGUINAL HERNIA REPAIR  2005   left   LEFT HEART CATHETERIZATION WITH CORONARY ANGIOGRAM N/A 07/30/2011   Procedure: LEFT HEART CATHETERIZATION WITH CORONARY ANGIOGRAM;  Surgeon: Kathleene Hazel, MD;  Location: [facility] CATH LAB;  Service: Cardiovascular;  Laterality: N/A;   PERCUTANEOUS CORONARY STENT INTERVENTION (PCI-S)  07/30/2011  Procedure: PERCUTANEOUS CORONARY STENT INTERVENTION (PCI-S);  Surgeon: Kathleene Hazel, MD;  Location: [facility] CATH LAB;  Service: Cardiovascular;;   RETINAL DETACHMENT SURGERY  05/2008; 06/2008; 07/2008   left; "all 3 surgeries have failed"    MEDICATIONS: No current facility-administered medications for this encounter.    apixaban (ELIQUIS) 5 MG TABS tablet   atorvastatin (LIPITOR) 80 MG tablet   buPROPion (WELLBUTRIN XL) 150 MG 24 hr tablet   feeding supplement (ENSURE ENLIVE / ENSURE PLUS) LIQD   FLUoxetine (PROZAC) 20 MG capsule   Glucosamine HCl (GLUCOSAMINE PO)   nitroGLYCERIN (NITROSTAT) 0.4 MG SL tablet   vitamin E 1000 UNIT capsule     Shonna Chock, PA-C Surgical Short Stay/Anesthesiology [facility] Phone [phone] [facility] Phone [phone] 02/20/2023 12:30 PM

## 2023-02-20 NOTE — Progress Notes (Signed)
PCP - Dr. Jacquiline Doe Cardiologist - Dr Cristal Deer  PPM/ICD - denies Device Orders - n/a Rep Notified - n/a  Chest x-ray - 11-11-22 EKG - 11-11-22 Stress Test - denies ECHO - 11-12-22 Cardiac Cath - 02-21-15  CPAP - denies DM - denies  Blood Thinner Instructions: Eliquis was told to stop eliquis today Aspirin Instructions: n/a  ERAS Protcol - ERAS no drink clear liquids unil 7:30 a.m.  COVID TEST- n/a  Anesthesia review: yes cardiac hx  Patient verbally denies any shortness of breath, fever, cough and chest pain during phone call   -------------  SDW INSTRUCTIONS given:  Your procedure is scheduled on sept 12,2024.  Report to [facility] Main Entrance "A" at 8:00 A.M., and check in at the Admitting office.  Call this number if you have problems the morning of surgery:  [phone]   Remember:  Do not eat after midnight the night before your surgery  You may drink clear liquids until 7:30 a.m. the morning of your surgery.   Clear liquids allowed are: Water, Non-Citrus Juices (without pulp), Carbonated Beverages, Clear Tea, Black Coffee Only, and Gatorade    Take these medicines the morning of surgery with A SIP OF WATER  nitroGLYCERIN (NITROSTAT if needed  As of today, STOP taking any Aspirin (unless otherwise instructed by your surgeon) Aleve, Naproxen, Ibuprofen, Motrin, Advil, Goody's, BC's, all herbal medications, fish oil, and all vitamins.                      Do not wear jewelry, make up, or nail polish            Do not wear lotions, powders, perfumes/colognes, or deodorant.            Do not shave 48 hours prior to surgery.  Men may shave face and neck.            Do not bring valuables to the hospital.            [facility] is not responsible for any belongings or valuables.  Do NOT Smoke (Tobacco/Vaping) 24 hours prior to your procedure If you use a CPAP at night, you may bring all equipment for your overnight stay.   Contacts, glasses, dentures or  bridgework may not be worn into surgery.      For patients admitted to the hospital, discharge time will be determined by your treatment team.   Patients discharged the day of surgery will not be allowed to drive home, and someone needs to stay with them for 24 hours.    Special instructions:   [facility]- Preparing For Surgery  Before surgery, you can play an important role. Because skin is not sterile, your skin needs to be as free of germs as possible. You can reduce the number of germs on your skin by washing with CHG (chlorahexidine gluconate) Soap before surgery.  CHG is an antiseptic cleaner which kills germs and bonds with the skin to continue killing germs even after washing.    Oral Hygiene is also important to reduce your risk of infection.  Remember - BRUSH YOUR TEETH THE MORNING OF SURGERY WITH YOUR REGULAR TOOTHPASTE  Please do not use if you have an allergy to CHG or antibacterial soaps. If your skin becomes reddened/irritated stop using the CHG.  Do not shave (including legs and underarms) for at least 48 hours prior to first CHG shower. It is OK to shave your face.  Please follow  these instructions carefully.   Shower the NIGHT BEFORE SURGERY and the MORNING OF SURGERY with DIAL Soap.   Pat yourself dry with a CLEAN TOWEL.  Wear CLEAN PAJAMAS to bed the night before surgery  Place CLEAN SHEETS on your bed the night of your first shower and DO NOT SLEEP WITH PETS.   Day of Surgery: Please shower morning of surgery  Wear Clean/Comfortable clothing the morning of surgery Do not apply any deodorants/lotions.   Remember to brush your teeth WITH YOUR REGULAR TOOTHPASTE.   Questions were answered. Patient verbalized understanding of instructions.

## 2023-02-21 ENCOUNTER — Ambulatory Visit (HOSPITAL_COMMUNITY): Payer: Medicare HMO | Admitting: Vascular Surgery

## 2023-02-21 ENCOUNTER — Encounter (HOSPITAL_COMMUNITY): Payer: Self-pay | Admitting: Orthopedic Surgery

## 2023-02-21 ENCOUNTER — Encounter (HOSPITAL_COMMUNITY)
Admission: RE | Disposition: A | Discharge status: Home / Self Care | Payer: Self-pay | Source: Home / Self Care | Attending: Orthopedic Surgery

## 2023-02-21 ENCOUNTER — Other Ambulatory Visit: Payer: Self-pay

## 2023-02-21 ENCOUNTER — Ambulatory Visit (HOSPITAL_BASED_OUTPATIENT_CLINIC_OR_DEPARTMENT_OTHER): Payer: Self-pay | Admitting: Vascular Surgery

## 2023-02-21 ENCOUNTER — Ambulatory Visit (HOSPITAL_COMMUNITY)
Admission: RE | Admit: 2023-02-21 | Discharge: 2023-02-21 | Disposition: A | Discharge status: Home / Self Care | Payer: Medicare HMO | Attending: Orthopedic Surgery | Admitting: Orthopedic Surgery

## 2023-02-21 ENCOUNTER — Other Ambulatory Visit (HOSPITAL_COMMUNITY): Payer: Self-pay

## 2023-02-21 DIAGNOSIS — W19XXXA Unspecified fall, initial encounter: Secondary | ICD-10-CM | POA: Diagnosis not present

## 2023-02-21 DIAGNOSIS — I4510 Unspecified right bundle-branch block: Secondary | ICD-10-CM | POA: Diagnosis not present

## 2023-02-21 DIAGNOSIS — E785 Hyperlipidemia, unspecified: Secondary | ICD-10-CM | POA: Diagnosis not present

## 2023-02-21 DIAGNOSIS — I10 Essential (primary) hypertension: Secondary | ICD-10-CM | POA: Insufficient documentation

## 2023-02-21 DIAGNOSIS — M6752 Plica syndrome, left knee: Secondary | ICD-10-CM | POA: Insufficient documentation

## 2023-02-21 DIAGNOSIS — M25812 Other specified joint disorders, left shoulder: Secondary | ICD-10-CM | POA: Diagnosis not present

## 2023-02-21 DIAGNOSIS — I2510 Atherosclerotic heart disease of native coronary artery without angina pectoris: Secondary | ICD-10-CM | POA: Diagnosis not present

## 2023-02-21 DIAGNOSIS — S8252XA Displaced fracture of medial malleolus of left tibia, initial encounter for closed fracture: Secondary | ICD-10-CM | POA: Insufficient documentation

## 2023-02-21 DIAGNOSIS — S82242D Displaced spiral fracture of shaft of left tibia, subsequent encounter for closed fracture with routine healing: Secondary | ICD-10-CM

## 2023-02-21 DIAGNOSIS — S46012A Strain of muscle(s) and tendon(s) of the rotator cuff of left shoulder, initial encounter: Secondary | ICD-10-CM | POA: Diagnosis not present

## 2023-02-21 DIAGNOSIS — Z955 Presence of coronary angioplasty implant and graft: Secondary | ICD-10-CM | POA: Diagnosis not present

## 2023-02-21 DIAGNOSIS — S83232A Complex tear of medial meniscus, current injury, left knee, initial encounter: Secondary | ICD-10-CM | POA: Insufficient documentation

## 2023-02-21 DIAGNOSIS — I252 Old myocardial infarction: Secondary | ICD-10-CM | POA: Diagnosis not present

## 2023-02-21 DIAGNOSIS — Z7901 Long term (current) use of anticoagulants: Secondary | ICD-10-CM | POA: Insufficient documentation

## 2023-02-21 DIAGNOSIS — Z87891 Personal history of nicotine dependence: Secondary | ICD-10-CM

## 2023-02-21 DIAGNOSIS — Z9181 History of falling: Secondary | ICD-10-CM | POA: Diagnosis not present

## 2023-02-21 DIAGNOSIS — I4819 Other persistent atrial fibrillation: Secondary | ICD-10-CM | POA: Insufficient documentation

## 2023-02-21 DIAGNOSIS — S83242A Other tear of medial meniscus, current injury, left knee, initial encounter: Secondary | ICD-10-CM | POA: Diagnosis not present

## 2023-02-21 DIAGNOSIS — M1990 Unspecified osteoarthritis, unspecified site: Secondary | ICD-10-CM | POA: Diagnosis not present

## 2023-02-21 HISTORY — PX: SHOULDER INJECTION: SHX5048

## 2023-02-21 HISTORY — PX: KNEE ARTHROSCOPY: SHX127

## 2023-02-21 LAB — CBC
HCT: 43.3 % (ref 39.0–52.0)
Hemoglobin: 14.5 g/dL (ref 13.0–17.0)
MCH: 30.9 pg (ref 26.0–34.0)
MCHC: 33.5 g/dL (ref 30.0–36.0)
MCV: 92.3 fL (ref 80.0–100.0)
Platelets: 243 10*3/uL (ref 150–400)
RBC: 4.69 MIL/uL (ref 4.22–5.81)
RDW: 14.2 % (ref 11.5–15.5)
WBC: 6.5 10*3/uL (ref 4.0–10.5)
nRBC: 0 % (ref 0.0–0.2)

## 2023-02-21 LAB — BASIC METABOLIC PANEL
Anion gap: 11 (ref 5–15)
BUN: 11 mg/dL (ref 8–23)
CO2: 20 mmol/L — ABNORMAL LOW (ref 22–32)
Calcium: 9 mg/dL (ref 8.9–10.3)
Chloride: 105 mmol/L (ref 98–111)
Creatinine, Ser: 0.98 mg/dL (ref 0.61–1.24)
GFR, Estimated: >60 mL/min (ref >60)
Glucose, Bld: 111 mg/dL — ABNORMAL HIGH (ref 70–99)
Potassium: 4.1 mmol/L (ref 3.5–5.1)
Sodium: 136 mmol/L (ref 135–145)

## 2023-02-21 SURGERY — ARTHROSCOPY, KNEE
Anesthesia: General | Site: Shoulder | Laterality: Left

## 2023-02-21 MED ORDER — GLYCOPYRROLATE 0.2 MG/ML IJ SOLN
INTRAMUSCULAR | Status: DC | PRN
Start: 2023-02-21 — End: 2023-02-21
  Administered 2023-02-21: .2 mg via INTRAVENOUS

## 2023-02-21 MED ORDER — BUPIVACAINE-EPINEPHRINE (PF) 0.25% -1:200000 IJ SOLN
INTRAMUSCULAR | Status: AC
  Filled 2023-02-21: qty 30

## 2023-02-21 MED ORDER — PROPOFOL 10 MG/ML IV BOLUS
INTRAVENOUS | Status: AC
  Filled 2023-02-21: qty 20

## 2023-02-21 MED ORDER — EPHEDRINE SULFATE-NACL 50-0.9 MG/10ML-% IV SOSY
PREFILLED_SYRINGE | INTRAVENOUS | Status: DC | PRN
  Administered 2023-02-21: 10 mg via INTRAVENOUS

## 2023-02-21 MED ORDER — ORAL CARE MOUTH RINSE: 15.0000 mL | Freq: Once | OROMUCOSAL | Status: AC

## 2023-02-21 MED ORDER — FENTANYL CITRATE (PF) 250 MCG/5ML IJ SOLN
INTRAMUSCULAR | Status: DC | PRN
  Administered 2023-02-21 (×3): 25 ug via INTRAVENOUS
  Administered 2023-02-21: 50 ug via INTRAVENOUS

## 2023-02-21 MED ORDER — LABETALOL HCL 5 MG/ML IV SOLN
10.0000 mg | Freq: Once | INTRAVENOUS | Status: AC
  Administered 2023-02-21: 10 mg via INTRAVENOUS

## 2023-02-21 MED ORDER — METHYLPREDNISOLONE ACETATE 40 MG/ML IJ SUSP
INTRAMUSCULAR | Status: DC | PRN
  Administered 2023-02-21: 8 mL via INTRAMUSCULAR

## 2023-02-21 MED ORDER — BUPIVACAINE-EPINEPHRINE 0.25% -1:200000 IJ SOLN
INTRAMUSCULAR | Status: DC | PRN
  Administered 2023-02-21: 22 mL

## 2023-02-21 MED ORDER — CEFAZOLIN SODIUM-DEXTROSE 2-4 GM/100ML-% IV SOLN
2.0000 g | INTRAVENOUS | Status: AC
  Administered 2023-02-21: 2 g via INTRAVENOUS
  Filled 2023-02-21: qty 100

## 2023-02-21 MED ORDER — ONDANSETRON HCL 4 MG/2ML IJ SOLN
INTRAMUSCULAR | Status: DC | PRN
  Administered 2023-02-21: 4 mg via INTRAVENOUS

## 2023-02-21 MED ORDER — CHLORHEXIDINE GLUCONATE 0.12 % MT SOLN
OROMUCOSAL | Status: AC
  Administered 2023-02-21: 15 mL via OROMUCOSAL
  Filled 2023-02-21: qty 15

## 2023-02-21 MED ORDER — LABETALOL HCL 5 MG/ML IV SOLN
INTRAVENOUS | Status: AC
  Filled 2023-02-21: qty 4

## 2023-02-21 MED ORDER — ONDANSETRON 4 MG PO TBDP
4.0000 mg | ORAL_TABLET | Freq: Three times a day (TID) | ORAL | 0 refills | Status: DC | PRN
  Filled 2023-02-21: qty 20, 7d supply, fill #0

## 2023-02-21 MED ORDER — ACETAMINOPHEN 500 MG PO TABS
1000.0000 mg | ORAL_TABLET | Freq: Once | ORAL | Status: AC
  Administered 2023-02-21: 1000 mg via ORAL
  Filled 2023-02-21: qty 2

## 2023-02-21 MED ORDER — SODIUM CHLORIDE 0.9 % IR SOLN
Status: DC | PRN
Start: 2023-02-21 — End: 2023-02-21
  Administered 2023-02-21 (×2): 6000 mL

## 2023-02-21 MED ORDER — MORPHINE SULFATE (PF) 4 MG/ML IV SOLN
INTRAVENOUS | Status: AC
  Filled 2023-02-21: qty 1

## 2023-02-21 MED ORDER — FENTANYL CITRATE (PF) 250 MCG/5ML IJ SOLN
INTRAMUSCULAR | Status: AC
  Filled 2023-02-21: qty 5

## 2023-02-21 MED ORDER — OXYCODONE HCL 5 MG/5ML PO SOLN: 5.0000 mg | Freq: Once | ORAL | Status: DC | PRN

## 2023-02-21 MED ORDER — LACTATED RINGERS IV SOLN: INTRAVENOUS | Status: DC

## 2023-02-21 MED ORDER — OXYCODONE HCL 5 MG PO TABS
5.0000 mg | ORAL_TABLET | Freq: Three times a day (TID) | ORAL | 0 refills | Status: AC | PRN
Start: 2023-02-21 — End: 2024-02-21
  Filled 2023-02-21: qty 30, 5d supply, fill #0

## 2023-02-21 MED ORDER — METHYLPREDNISOLONE ACETATE 40 MG/ML IJ SUSP
INTRAMUSCULAR | Status: AC
  Filled 2023-02-21: qty 1

## 2023-02-21 MED ORDER — BUPIVACAINE HCL (PF) 0.25 % IJ SOLN
INTRAMUSCULAR | Status: AC
  Filled 2023-02-21: qty 10

## 2023-02-21 MED ORDER — PROPOFOL 10 MG/ML IV BOLUS
INTRAVENOUS | Status: DC | PRN
  Administered 2023-02-21: 50 mg via INTRAVENOUS
  Administered 2023-02-21: 100 mg via INTRAVENOUS

## 2023-02-21 MED ORDER — PHENYLEPHRINE HCL-NACL 20-0.9 MG/250ML-% IV SOLN
INTRAVENOUS | Status: DC | PRN
  Administered 2023-02-21: 20 ug/min via INTRAVENOUS

## 2023-02-21 MED ORDER — FENTANYL CITRATE (PF) 100 MCG/2ML IJ SOLN: 25.0000 ug | INTRAMUSCULAR | Status: DC | PRN

## 2023-02-21 MED ORDER — OXYCODONE HCL 5 MG PO TABS: 5.0000 mg | ORAL_TABLET | Freq: Once | ORAL | Status: DC | PRN

## 2023-02-21 MED ORDER — DEXAMETHASONE SODIUM PHOSPHATE 10 MG/ML IJ SOLN
INTRAMUSCULAR | Status: AC
  Filled 2023-02-21: qty 1

## 2023-02-21 MED ORDER — MORPHINE SULFATE (PF) 4 MG/ML IV SOLN
INTRAVENOUS | Status: DC | PRN
  Administered 2023-02-21: 4 mg via INTRAMUSCULAR

## 2023-02-21 MED ORDER — ACETAMINOPHEN 500 MG PO TABS
500.0000 mg | ORAL_TABLET | Freq: Three times a day (TID) | ORAL | 0 refills | Status: DC | PRN
  Filled 2023-02-21: qty 100, 17d supply, fill #0

## 2023-02-21 MED ORDER — CHLORHEXIDINE GLUCONATE 0.12 % MT SOLN: 15.0000 mL | Freq: Once | OROMUCOSAL | Status: AC

## 2023-02-21 SURGICAL SUPPLY — 57 items
BAG COUNTER SPONGE SURGICOUNT (BAG) ×2 IMPLANT
BAG SPNG CNTER NS LX DISP (BAG) ×2
BANDAGE ESMARK 6X9 LF (GAUZE/BANDAGES/DRESSINGS) IMPLANT
BLADE CUDA 5.5 (BLADE) IMPLANT
BLADE EXCALIBUR 4.0X13 (MISCELLANEOUS) ×2 IMPLANT
BLADE SURG 11 STRL SS (BLADE) ×2 IMPLANT
BNDG ADH 1X3 SHEER STRL LF (GAUZE/BANDAGES/DRESSINGS) IMPLANT
BNDG ADH THN 3X1 STRL LF (GAUZE/BANDAGES/DRESSINGS) ×2
BNDG CMPR 5X4 KNIT ELC UNQ LF (GAUZE/BANDAGES/DRESSINGS) ×2
BNDG CMPR 6 X 5 YARDS HK CLSR (GAUZE/BANDAGES/DRESSINGS) ×2
BNDG CMPR 9X6 STRL LF SNTH (GAUZE/BANDAGES/DRESSINGS)
BNDG ELASTIC 4INX 5YD STR LF (GAUZE/BANDAGES/DRESSINGS) IMPLANT
BNDG ELASTIC 6INX 5YD STR LF (GAUZE/BANDAGES/DRESSINGS) IMPLANT
BNDG ELASTIC 6X5.8 VLCR STR LF (GAUZE/BANDAGES/DRESSINGS) ×4 IMPLANT
BNDG ESMARK 6X9 LF (GAUZE/BANDAGES/DRESSINGS)
BNDG GAUZE DERMACEA FLUFF 4 (GAUZE/BANDAGES/DRESSINGS) IMPLANT
BNDG GZE DERMACEA 4 6PLY (GAUZE/BANDAGES/DRESSINGS) ×2
BRUSH SCRUB EZ PLAIN DRY (MISCELLANEOUS) ×4 IMPLANT
COVER SURGICAL LIGHT HANDLE (MISCELLANEOUS) ×4 IMPLANT
CUFF TOURN SGL QUICK 34 (TOURNIQUET CUFF)
CUFF TOURN SGL QUICK 42 (TOURNIQUET CUFF) IMPLANT
CUFF TRNQT CYL 34X4.125X (TOURNIQUET CUFF) IMPLANT
DRAPE ARTHROSCOPY W/POUCH 114 (DRAPES) ×2 IMPLANT
DRAPE ORTHO SPLIT 77X108 STRL (DRAPES) ×2
DRAPE SURG ORHT 6 SPLT 77X108 (DRAPES) IMPLANT
DRAPE U-SHAPE 47X51 STRL (DRAPES) ×2 IMPLANT
DRSG EMULSION OIL 3X3 NADH (GAUZE/BANDAGES/DRESSINGS) ×2 IMPLANT
GAUZE 4X4 16PLY ~~LOC~~+RFID DBL (SPONGE) ×2 IMPLANT
GAUZE PAD ABD 8X10 STRL (GAUZE/BANDAGES/DRESSINGS) ×4 IMPLANT
GAUZE SPONGE 4X4 12PLY STRL (GAUZE/BANDAGES/DRESSINGS) ×2 IMPLANT
GLOVE BIO SURGEON STRL SZ7.5 (GLOVE) ×2 IMPLANT
GLOVE BIO SURGEON STRL SZ8 (GLOVE) ×2 IMPLANT
GLOVE BIOGEL PI IND STRL 7.5 (GLOVE) ×2 IMPLANT
GLOVE BIOGEL PI IND STRL 8 (GLOVE) ×2 IMPLANT
GLOVE SURG ORTHO LTX SZ7.5 (GLOVE) ×4 IMPLANT
GOWN STRL REUS W/ TWL LRG LVL3 (GOWN DISPOSABLE) ×4 IMPLANT
GOWN STRL REUS W/ TWL XL LVL3 (GOWN DISPOSABLE) ×2 IMPLANT
GOWN STRL REUS W/TWL LRG LVL3 (GOWN DISPOSABLE) ×2
GOWN STRL REUS W/TWL XL LVL3 (GOWN DISPOSABLE) ×2
KIT BASIN OR (CUSTOM PROCEDURE TRAY) ×2 IMPLANT
KIT TURNOVER KIT B (KITS) ×2 IMPLANT
MANIFOLD NEPTUNE II (INSTRUMENTS) ×2 IMPLANT
NDL 18GX1X1/2 (RX/OR ONLY) (NEEDLE) IMPLANT
NDL HYPO 22X1.5 SAFETY MO (MISCELLANEOUS) IMPLANT
NEEDLE 18GX1X1/2 (RX/OR ONLY) (NEEDLE) ×2 IMPLANT
NEEDLE HYPO 22X1.5 SAFETY MO (MISCELLANEOUS) ×4 IMPLANT
PACK ARTHROSCOPY DSU (CUSTOM PROCEDURE TRAY) ×2 IMPLANT
PAD ARMBOARD 7.5X6 YLW CONV (MISCELLANEOUS) ×4 IMPLANT
PADDING CAST COTTON 6X4 STRL (CAST SUPPLIES) ×4 IMPLANT
PORT APPOLLO RF 90DEGREE MULTI (SURGICAL WAND) IMPLANT
SUT ETHILON 3 0 PS 1 (SUTURE) IMPLANT
SUT ETHILON 4 0 PS 2 18 (SUTURE) ×2 IMPLANT
SYR CONTROL 10ML LL (SYRINGE) IMPLANT
TOWEL GREEN STERILE FF (TOWEL DISPOSABLE) ×4 IMPLANT
TUBE CONNECTING 12X1/4 (SUCTIONS) ×2 IMPLANT
TUBING ARTHROSCOPY IRRIG 16FT (MISCELLANEOUS) ×2 IMPLANT
WATER STERILE IRR 1000ML POUR (IV SOLUTION) ×2 IMPLANT

## 2023-02-21 NOTE — H&P (Addendum)
Orthopaedic Trauma Service H&P  Patient ID: DANGER MORSCH MRN: [id number] DOB/AGE: 86-Sep-1936 86 y.o.  Chief Complaint: left knee complex medial meniscus tear HPI: Luis [name] is an 88 y.o. male.s/p repair of left femur with IMN who also developed severe left knee pain, which has been unresponsive to PT, steroid injection, and NSAIDs. He has fallen due to acute mechanical symptoms and remains at high risk for falling and subsequent fracture and further injury. Clinical examination has been definitive for medial meniscal tear and this has now also been confirmed by MRI. He now presents for arthroscopic partial medial meniscectomy.  Left shoulder continues to be painful and MRI is pending. He cannot tolerate mobilization with crutches at this time but believes he can with subacromial injection. Consequently, this will be performed, as well.  Past Medical History:  Diagnosis Date   Anal abscess    CAD (coronary artery disease)    a. BMS-RCA, BMS-Cx, PTCA of OM1 in 2001. b. overlapping DES in RCA and PTCA to small distal Cx 2013. c. Cath 2016 -> stable CAD, suspect dyspnea unrelated to coronary disease.   COLONIC POLYPS, HX OF    Dr Kinnie Scales   DJD (degenerative joint disease)    Essential hypertension    Hyperlipidemia    Myocardial infarction (HCC) 08/1999   Persistent atrial fibrillation (HCC)    a. dx 2016.   RBBB    TIA (transient ischemic attack)     Past Surgical History:  Procedure Laterality Date   CARDIAC CATHETERIZATION  ~ 2002   CARDIAC CATHETERIZATION N/A 03/02/2015   Procedure: Left Heart Cath and Coronary Angiography;  Surgeon: Tonny Bollman, MD;  Location: [facility] INVASIVE CV LAB;  Service: Cardiovascular;  Laterality: N/A;   CATARACT EXTRACTION W/ INTRAOCULAR LENS  IMPLANT, BILATERAL  ~ 2007   bilaterally   CORONARY ANGIOPLASTY  07/30/11   CORONARY ANGIOPLASTY WITH STENT PLACEMENT  2001; 07/30/11;   "2+3"   FEMUR IM NAIL Left 11/12/2022   Procedure:  INTRAMEDULLARY (IM) NAIL HIP FRACTURE;  Surgeon: Myrene Galas, MD;  Location: [facility] OR;  Service: Orthopedics;  Laterality: Left;   INGUINAL HERNIA REPAIR  2005   left   LEFT HEART CATHETERIZATION WITH CORONARY ANGIOGRAM N/A 07/30/2011   Procedure: LEFT HEART CATHETERIZATION WITH CORONARY ANGIOGRAM;  Surgeon: Kathleene Hazel, MD;  Location: [facility] CATH LAB;  Service: Cardiovascular;  Laterality: N/A;   PERCUTANEOUS CORONARY STENT INTERVENTION (PCI-S)  07/30/2011   Procedure: PERCUTANEOUS CORONARY STENT INTERVENTION (PCI-S);  Surgeon: Kathleene Hazel, MD;  Location: [facility] CATH LAB;  Service: Cardiovascular;;   RETINAL DETACHMENT SURGERY  05/2008; 06/2008; 07/2008   left; "all 3 surgeries have failed"    Family History  Problem Relation Age of Onset   Alcohol abuse Mother    Colon cancer Father        in 76s   Alcoholism Sister    Social History:  reports that he quit smoking about 23 years ago. His smoking use included cigarettes. He started smoking about 63 years ago. He has a 20 pack-year smoking history. He has never used smokeless tobacco. He reports that he does not drink alcohol and does not use drugs.  Allergies: No Known Allergies  Medications Prior to Admission  Medication Sig Dispense Refill   apixaban (ELIQUIS) 5 MG TABS tablet TAKE ONE TABLET TWICE DAILY 180 tablet 2   atorvastatin (LIPITOR) 80 MG tablet TAKE ONE TABLET EACH DAY 90 tablet 0   buPROPion (WELLBUTRIN XL) 150 MG  24 hr tablet Take 150 mg by mouth daily.     feeding supplement (ENSURE ENLIVE / ENSURE PLUS) LIQD Take 237 mLs by mouth 3 (three) times daily between meals. 237 mL 12   FLUoxetine (PROZAC) 20 MG capsule Take 20 mg by mouth daily.     Glucosamine HCl (GLUCOSAMINE PO) Take 2 tablets by mouth daily.     nitroGLYCERIN (NITROSTAT) 0.4 MG SL tablet DISSOLVE 1 TABLET UNDER TONGUE AS NEEEDED FOR CHEST PAIN, MAY REPEAT IN 5 MINUTES FOR 2 DOSES 25 tablet 6   vitamin E 1000 UNIT capsule Take 1,000 Units by mouth  daily.      No results found for this or any previous visit (from the past 48 hour(s)). No results found.  ROS No recent fever, bleeding abnormalities, urologic dysfunction, GI problems, or weight gain.   Blood pressure (!) 140/90, pulse 65, temperature 98.4 F (36.9 C), temperature source Oral, resp. rate 20, height 6' (1.829 m), weight 77.1 kg, SpO2 96%. Physical Exam NCAT RRR LLE No traumatic wounds, ecchymosis, or rash  Tender MJL with markedly positive McMurray's test  Moderate knee effusion  Knee stable to varus/ valgus and anterior/posterior stress  Sens DPN, SPN, TN intact  Motor EHL, ext, flex, evers 5/5  DP 2+, No significant edema  Assessment/Plan  Left medial meniscus tear, complex, unstable with high fall risk Left shoulder rotator cuff tear.  The risks and benefits of left shoulder steroid injection and left knee scope mensiscectomy were discussed with the patient and his wife, including the possibility of infection, nerve injury, vessel injury, wound breakdown, arthritis, failure to alleviate all symptoms, DVT/ PE, loss of motion, and need for further surgery among others.  These risks were acknowledged and consent provided to proceed.  Myrene Galas, MD [facility], [facility] [phone]  02/21/2023, 8:24 AM  [facility] [address] [place] [address] [phone] (F)

## 2023-02-21 NOTE — Anesthesia Procedure Notes (Signed)
Procedure Name: LMA Insertion Date/Time: 02/21/2023 12:57 PM  Performed by: Gwendel Hanson, CRNAPre-anesthesia Checklist: Patient identified, Emergency Drugs available, Suction available and Patient being monitored Patient Re-evaluated:Patient Re-evaluated prior to induction Oxygen Delivery Method: Circle System Utilized Preoxygenation: Pre-oxygenation with 100% oxygen Induction Type: IV induction Ventilation: Mask ventilation without difficulty LMA: LMA inserted LMA Size: 4.0 Number of attempts: 1 Airway Equipment and Method: Bite block Placement Confirmation: positive ETCO2 Tube secured with: Tape Dental Injury: Teeth and Oropharynx as per pre-operative assessment

## 2023-02-21 NOTE — Anesthesia Postprocedure Evaluation (Signed)
Anesthesia Post Note  Patient: DILLEN GRAYER  Procedure(s) Performed: LEFT KNEE ARTHROSCOPY (Left: Knee) LEFT SHOULDER SUBACROMIAL INJECTION WITH STEROID (Left: Shoulder)     Patient location during evaluation: PACU Anesthesia Type: General Level of consciousness: awake and alert Pain management: pain level controlled Vital Signs Assessment: post-procedure vital signs reviewed and stable Respiratory status: spontaneous breathing, nonlabored ventilation, respiratory function stable and patient connected to nasal cannula oxygen Cardiovascular status: blood pressure returned to baseline and stable Postop Assessment: no apparent nausea or vomiting Anesthetic complications: no   No notable events documented.  Last Vitals:  Vitals:   02/21/23 1545 02/21/23 1600  BP: (!) 160/79 (!) 160/77  Pulse: 61 62  Resp: 14 13  Temp:    SpO2: 95% 95%    Last Pain:  Vitals:   02/21/23 1600  TempSrc:   PainSc: 0-No pain                 [place]

## 2023-02-21 NOTE — Transfer of Care (Signed)
Immediate Anesthesia Transfer of Care Note  Patient: Luis [name]  Procedure(s) Performed: LEFT KNEE ARTHROSCOPY (Left: Knee) LEFT SHOULDER SUBACROMIAL INJECTION WITH STEROID (Left: Shoulder)  Patient Location: PACU  Anesthesia Type:General  Level of Consciousness: awake, alert , and oriented  Airway & Oxygen Therapy: Patient Spontanous Breathing  Post-op Assessment: Report given to RN and Post -op Vital signs reviewed and stable  Post vital signs: Reviewed and stable  Last Vitals:  Vitals Value Taken Time  BP 198/130 02/21/23 1503  Temp    Pulse 88 02/21/23 1506  Resp 15 02/21/23 1506  SpO2 95 % 02/21/23 1506  Vitals shown include unfiled device data.  Last Pain:  Vitals:   02/21/23 0840  TempSrc:   PainSc: 4          Complications: No notable events documented.

## 2023-02-21 NOTE — Discharge Instructions (Addendum)
[facility] Discharge Instructions   General Discharge Instructions   WEIGHT BEARING STATUS: weightbearing as tolerated left leg. Use crutches or walker   RANGE OF MOTION/ACTIVITY: no motion restrictions to L knee or shoulder. Slowly increase activity level    Wound Care: daily wound care starting on 02/23/2023. See below  Discharge Wound Care Instructions  Do NOT apply any ointments, solutions or lotions to pin sites or surgical wounds.  These prevent needed drainage and even though solutions like hydrogen peroxide kill bacteria, they also damage cells lining the pin sites that help fight infection.  Applying lotions or ointments can keep the wounds moist and can cause them to breakdown and open up as well. This can increase the risk for infection. When in doubt call the office.  Surgical incisions should be dressed daily.  If any drainage is noted, use one layer of adaptic or Mepitel, then gauze, Kerlix, and an ace wrap.  [email or web address] [email or web address]_[email or web address]  [email or web address]  These dressing supplies should be available at local medical supply stores ([facility] [employer], [facility], etc). They are not usually carried at places like [facility], [facility], [facility], etc  Once the incision is completely dry and without drainage, it may be left open to air out.  Showering may begin 36-48 hours later.  Cleaning gently with soap and water.  Traumatic wounds should be dressed daily as well.    One layer of adaptic, gauze, Kerlix, then ace wrap.  The adaptic can be discontinued once the draining has ceased    If you have a wet to dry dressing: wet the gauze with saline the squeeze as much saline out so the gauze is moist (not soaking wet), place moistened gauze  over wound, then place a dry gauze over the moist one, followed by Kerlix wrap, then ace wrap.   Diet: as you were eating previously.  Can use over the counter stool softeners and bowel preparations, such as Miralax, to help with bowel movements.  Narcotics can be constipating.  Be sure to drink plenty of fluids  PAIN MEDICATION USE AND EXPECTATIONS  You have likely been given narcotic medications to help control your pain.  After a traumatic event that results in an fracture (broken bone) with or without surgery, it is ok to use narcotic pain medications to help control one's pain.  We understand that everyone responds to pain differently and each individual patient will be evaluated on a regular basis for the continued need for narcotic medications. Ideally, narcotic medication use should last no more than 6-8 weeks (coinciding with fracture healing).   As a patient it is your responsibility as well to monitor narcotic medication use and report the amount and frequency you use these medications when you come to your office visit.   We would also advise that if you are using narcotic medications, you should take a dose prior to therapy to maximize you participation.  IF YOU ARE ON NARCOTIC MEDICATIONS IT IS NOT PERMISSIBLE TO OPERATE A MOTOR VEHICLE (MOTORCYCLE/CAR/TRUCK/MOPED) OR HEAVY MACHINERY DO NOT MIX NARCOTICS WITH OTHER CNS (CENTRAL NERVOUS SYSTEM) DEPRESSANTS SUCH AS ALCOHOL   POST-OPERATIVE OPIOID TAPER INSTRUCTIONS: It is important to wean off of your opioid medication as soon as possible. If you do not need pain medication after your surgery it is ok to stop day one. Opioids include: Codeine, Hydrocodone(Norco, Vicodin), Oxycodone(Percocet, oxycontin) and hydromorphone amongst others.  Long term and even short term use of opiods can cause: Increased pain response Dependence Constipation Depression  Respiratory depression And more.  Withdrawal symptoms can include Flu like  symptoms Nausea, vomiting And more Techniques to manage these symptoms Hydrate well Eat regular healthy meals Stay active Use relaxation techniques(deep breathing, meditating, yoga) Do Not substitute Alcohol to help with tapering If you have been on opioids for less than two weeks and do not have pain than it is ok to stop all together.  Plan to wean off of opioids This plan should start within one week post op of your fracture surgery  Maintain the same interval or time between taking each dose and first decrease the dose.  Cut the total daily intake of opioids by one tablet each day Next start to increase the time between doses. The last dose that should be eliminated is the evening dose.    STOP SMOKING OR USING NICOTINE PRODUCTS!!!!  As discussed nicotine severely impairs your body's ability to heal surgical and traumatic wounds but also impairs bone healing.  Wounds and bone heal by forming microscopic blood vessels (angiogenesis) and nicotine is a vasoconstrictor (essentially, shrinks blood vessels).  Therefore, if vasoconstriction occurs to these microscopic blood vessels they essentially disappear and are unable to deliver necessary nutrients to the healing tissue.  This is one modifiable factor that you can do to dramatically increase your chances of healing your injury.    (This means no smoking, no nicotine gum, patches, etc)  DO NOT USE NONSTEROIDAL ANTI-INFLAMMATORY DRUGS (NSAID'S)  Using products such as Advil (ibuprofen), Aleve (naproxen), Motrin (ibuprofen) for additional pain control during fracture healing can delay and/or prevent the healing response.  If you would like to take over the counter (OTC) medication, Tylenol (acetaminophen) is ok.  However, some narcotic medications that are given for pain control contain acetaminophen as well. Therefore, you should not exceed more than 4000 mg of tylenol in a day if you do not have liver disease.  Also note that there are may  OTC medicines, such as cold medicines and allergy medicines that my contain tylenol as well.  If you have any questions about medications and/or interactions please ask your doctor/PA or your pharmacist.      ICE AND ELEVATE INJURED/OPERATIVE EXTREMITY  Using ice and elevating the injured extremity above your heart can help with swelling and pain control.  Icing in a pulsatile fashion, such as 20 minutes on and 20 minutes off, can be followed.    Do not place ice directly on skin. Make sure there is a barrier between to skin and the ice pack.    Using frozen items such as frozen peas works well as the conform nicely to the are that needs to be iced.  USE AN ACE WRAP OR TED HOSE FOR SWELLING CONTROL  In addition to icing and elevation, Ace wraps or TED hose are used to help limit and resolve swelling.  It is recommended to use Ace wraps or TED hose until you are informed to stop.    When using Ace Wraps start the wrapping distally (farthest away from the body) and wrap proximally (closer to the body)   Example: If you had surgery on your leg or thing and you do not have a splint on, start the ace wrap at the toes and work your way up to the thigh        If you had surgery on your upper extremity and do not have a splint on, start the ace wrap at your fingers and work your way up to the  upper arm  IF YOU ARE IN A SPLINT OR CAST DO NOT REMOVE IT FOR ANY REASON   If your splint gets wet for any reason please contact the office immediately. You may shower in your splint or cast as long as you keep it dry.  This can be done by wrapping in a cast cover or garbage back (or similar)  Do Not stick any thing down your splint or cast such as pencils, money, or hangers to try and scratch yourself with.  If you feel itchy take benadryl as prescribed on the bottle for itching  IF YOU ARE IN A CAM BOOT (BLACK BOOT)  You may remove boot periodically. Perform daily dressing changes as noted below.  Wash the liner  of the boot regularly and wear a sock when wearing the boot. It is recommended that you sleep in the boot until told otherwise    Call office for the following: Temperature greater than 101F Persistent nausea and vomiting Severe uncontrolled pain Redness, tenderness, or signs of infection (pain, swelling, redness, odor or green/yellow discharge around the site) Difficulty breathing, headache or visual disturbances Hives Persistent dizziness or light-headedness Extreme fatigue Any other questions or concerns you may have after discharge  In an emergency, call 911 or go to an Emergency Department at a nearby hospital  HELPFUL INFORMATION  If you had a block, it will wear off between 8-24 hrs postop typically.  This is period when your pain may go from nearly zero to the pain you would have had postop without the block.  This is an abrupt transition but nothing dangerous is happening.  You may take an extra dose of narcotic when this happens.  You should wean off your narcotic medicines as soon as you are able.  Most patients will be off or using minimal narcotics before their first postop appointment.   We suggest you use the pain medication the first night prior to going to bed, in order to ease any pain when the anesthesia wears off. You should avoid taking pain medications on an empty stomach as it will make you nauseous.  Do not drink alcoholic beverages or take illicit drugs when taking pain medications.  In most states it is against the law to drive while you are in a splint or sling.  And certainly against the law to drive while taking narcotics.  You may return to work/school in the next couple of days when you feel up to it.   Pain medication may make you constipated.  Below are a few solutions to try in this order: Decrease the amount of pain medication if you aren't having pain. Drink lots of decaffeinated fluids. Drink prune juice and/or each dried prunes  If the first 3  don't work start with additional solutions Take Colace - an over-the-counter stool softener Take Senokot - an over-the-counter laxative Take Miralax - a stronger over-the-counter laxative     CALL THE OFFICE WITH ANY QUESTIONS OR CONCERNS: [phone]   VISIT OUR WEBSITE FOR ADDITIONAL INFORMATION: [email or web address]

## 2023-02-22 ENCOUNTER — Encounter (HOSPITAL_COMMUNITY): Payer: Self-pay | Admitting: Orthopedic Surgery

## 2023-02-22 ENCOUNTER — Other Ambulatory Visit (HOSPITAL_COMMUNITY): Payer: Self-pay

## 2023-02-25 DIAGNOSIS — M6281 Muscle weakness (generalized): Secondary | ICD-10-CM | POA: Diagnosis not present

## 2023-02-25 DIAGNOSIS — M25552 Pain in left hip: Secondary | ICD-10-CM | POA: Diagnosis not present

## 2023-02-25 DIAGNOSIS — M25512 Pain in left shoulder: Secondary | ICD-10-CM | POA: Diagnosis not present

## 2023-02-25 DIAGNOSIS — M25562 Pain in left knee: Secondary | ICD-10-CM | POA: Diagnosis not present

## 2023-02-25 DIAGNOSIS — R2681 Unsteadiness on feet: Secondary | ICD-10-CM | POA: Diagnosis not present

## 2023-02-26 NOTE — Op Note (Signed)
NAME: Luis [name], POMPER MEDICAL RECORD NO: [id number] ACCOUNT NO: [id number] DATE OF BIRTH: [birth year] FACILITY: [facility] LOCATION: [facility] PHYSICIAN: Doralee Albino. Carola Frost, MD  Operative Report   DATE OF PROCEDURE: 02/21/2023  PREOPERATIVE DIAGNOSES: 1.  Left medial meniscus complex tear. 2.  Knee arthritis. 3.  Left shoulder rotator cuff tear and impingement.  POSTOPERATIVE DIAGNOSES: 1.  Complex posterior horn fracture of the medial malleolus with an unstable radial tear. 2.  Degenerative lateral meniscus tear and loose body. 3.  Extensive anterior compartment synovitis with a large medial plica. 4.  Mild arthritis, given the patient's age. 5.  Left shoulder rotator cuff tear and impingement.  PROCEDURES: 1.  Arthroscopic partial meniscectomy of both the medial and lateral menisci. 2.  Chondroplasty with shaving of articular cartilage, patella and trochlea. 3.  Arthroscopic removal of loose body. 4.  Arthroscopic debridement of medial plica and anterior compartment synovitis. 5.  Intraarticular steroid injection, left shoulder.  SURGEON:  Myrene Galas, MD.  ASSISTANT:  Merton Border, MD.  COMPLICATIONS:  None.  TOURNIQUET:  None.  SPECIMENS:  None.  DISPOSITION:  To PACU.  CONDITION:  Stable.  BRIEF SUMMARY OF INDICATIONS FOR PROCEDURE:  The patient is a very pleasant active 88 year old male who sustained a left hip fracture and a fall back in June.  The patient then went to heal that fracture but has failed to progress because of persistent  knee and shoulder pain.  All reasonable attempts were made to strengthen the patient's quad and managed what was presumed to be arthritis or weakness related complaints as well as the pain in the left shoulder attributable to weightbearing with that  extremity while recovering from his hip fracture.  However, the patient failed to progress with PT and instead has developed mechanical symptoms in that knee including locking, catching and  giving way that have been refractory to prolonged physical therapy which was incorporated as part of his hip recovery. He has also received steroid injections and anti-inflammatories, with limited transient improvement and return of mechanical symptoms.  Specifically, both the shoulder and the knee have been injected.  Because of the patient's mechanical symptoms, he has fallen and is at high risk for further fracture or injury, which could require  surgery and hospitalization.  Consequently, we obtained approval for an MRI scan of both the knee and the shoulder. The shoulder remains pending, but the knee was definitive for identification of a large complex medial meniscus tear.  This was consistent  with physical examination as well as his clinical history, and failure of therapy.  Because of that, we recommended surgical debridement to reduce his fall risk and so he might progress with therapy.  He also requested injection into the joint in hopes that we could calm his symptoms there and perhaps alleviate his need for weightbearing through the shoulder with repair of the knee and see if we could gain control of his symptoms there.  He acknowledged the risk associated with surgery and strongly wished to proceed.  BRIEF SUMMARY OF PROCEDURE:  The patient was taken to the operating room where general anesthesia was induced.  The left shoulder was prepped in the usual fashion and a 6 and 1 mL injection of Marcaine and Depo-Medrol was injected through an anterior  approach directly into the shoulder joint gently moving the shoulder to confirm its location.  After administration, a Band-Aid was placed and attention turned to the leg.  Left lower extremity was prepped and draped in the usual sterile  fashion using chlorhexidine wash, Betadine scrub and paint.  Timeout was held. The knee was flexed 90 degrees.  Inferolateral and inferomedial portals were established and full diagnostic  arthroscopy performed of the  knee.  The patellofemoral compartment did not identify any loose bodies initially and small chondromalacia of the patella.  There were some arthritic changes of the trochlea.  There was extensive fibrosis in the anterior  compartment. A large medial plica scraping against both the medial and lateral femoral condyles and adjacent rub on the cartilage could be visualized.  This was exacerbated by anterior synovitis as well that was catching in between the surfaces and the  plica.  As I continued into the lateral compartment, a loose body and a degenerative tear of the anterior meniscus was identified immediately.  The loose body did not require separate incision for removal and I was able to use the arthroscopic shaver to debride  it.  It was a 4 x 3 mm.  Next, attention was turned to the lateral meniscus tear.  Here, an arthroscopic biter and arthroscopic shaver were used to debride the tear back to a healthy stable edge, removing anything that could be drawn into the joint surface to cause the tear to reoccur or propagate injury.  The cartilage was in surprisingly good shape on both the patella and the tibia with softening, but no grade III or IV.  Attention was then turned back to the medial side.  Here, access into the posterior aspect of the joint was difficult to obtain.  I was fortunate to have the assistance of my colleague, Dr. Merton Border, and together we were able to achieve access and fully treat the findings.  Using a spinal needle, the MCL was pie crusted, while applying valgus  with slight flexion and this enabled the probe to go in the back of the complex tear.  Beginning with the arthroscopic shaver, the frayed edges were removed.  The probe was introduced and at that point, we could see a large unstable radial tear where  the meniscus could flip out into the joint.  The hinge of this tear was anterior toward the mid body and extended around most of the posterior horn so was not initially  visible until probed despite its large size.  There was another fragment of the medial meniscus that had flipped up into the notch and was adjacent to the  eminence.  We debrided this meniscal tear all the way back to its origin and removed this unstable meniscus and that was using both an arthroscopic biter and a shaver.  We also debrided the portion adjacent to the eminence such that there remained no  unstable edges to the medial meniscus and the tear was completely excised.  The cartilage in this compartment also was in surprisingly good shape with no grade IV changes and minor grade III and II changes.  Lastly, attention was turned to the anterior  compartment.  Here, an arthroscopic wand and shaver were used to completely debride the medial plica and the anterior synovitis such that afterward nothing remained that could be caught between the articular surfaces to incite pain or further injury.  A chondroplasty was  performed of the trochlea and patella.  I did not identify any loose cartilage flaps within medial or lateral compartments.  The knee was irrigated thoroughly once more and all instruments removed from the knee.  Lastly, it was injected with 0.25% Marcaine and 4 mg of morphine.  Portals were closed  with nylon.  Sterile gently compressive dressing applied.  The patient was taken to the PACU in stable condition.  PROGNOSIS:  It is anticipated the patient should do very well following this debridement with the specific goal of eliminating his mechanical symptoms and fall risk associated with the knee.  He has been motivated and compliant with therapy and prior to injury had a high level of activity with no assistive aids.  We are hopeful he will get relief in his shoulder and be  able to transition off the walker and that reduce load will allow for some recovery, particularly if shoulder is primarily arthritis related.  MR arthrogram is scheduled to better evaluate the injury and tailor  treatment accordingly.   NIK D: 02/26/2023 9:23:13 am T: 02/26/2023 10:24:00 am  JOB: [id number]/ [id number]

## 2023-02-26 NOTE — Brief Op Note (Signed)
02/21/2023  9:22 AM  PATIENT:  Luis [name]  88 y.o. male  PROCEDURE:  Procedure(s): LEFT KNEE ARTHROSCOPY (Left) LEFT SHOULDER SUBACROMIAL INJECTION WITH STEROID (Left)  SURGEON:  Surgeons and Role:    Myrene Galas, MD - Primary    * Huel Cote, MD - Assisting  [phone]

## 2023-02-27 ENCOUNTER — Ambulatory Visit
Admit: 2023-02-27 | Discharge: 2023-02-27 | Disposition: A | Discharge status: Home / Self Care | Payer: Medicare HMO | Attending: Orthopedic Surgery | Admitting: Orthopedic Surgery

## 2023-02-27 DIAGNOSIS — M75122 Complete rotator cuff tear or rupture of left shoulder, not specified as traumatic: Secondary | ICD-10-CM | POA: Diagnosis not present

## 2023-02-27 DIAGNOSIS — M25512 Pain in left shoulder: Secondary | ICD-10-CM

## 2023-02-27 DIAGNOSIS — S46002A Unspecified injury of muscle(s) and tendon(s) of the rotator cuff of left shoulder, initial encounter: Secondary | ICD-10-CM

## 2023-02-27 DIAGNOSIS — M25562 Pain in left knee: Secondary | ICD-10-CM | POA: Diagnosis not present

## 2023-02-27 DIAGNOSIS — M67814 Other specified disorders of tendon, left shoulder: Secondary | ICD-10-CM | POA: Diagnosis not present

## 2023-02-27 DIAGNOSIS — M6281 Muscle weakness (generalized): Secondary | ICD-10-CM | POA: Diagnosis not present

## 2023-02-27 DIAGNOSIS — M25552 Pain in left hip: Secondary | ICD-10-CM | POA: Diagnosis not present

## 2023-02-27 DIAGNOSIS — R2681 Unsteadiness on feet: Secondary | ICD-10-CM | POA: Diagnosis not present

## 2023-02-27 MED ORDER — IOPAMIDOL (ISOVUE-M 200) INJECTION 41%
10.0000 mL | Freq: Once | INTRAMUSCULAR | Status: AC
  Administered 2023-02-27: 10 mL via INTRA_ARTICULAR

## 2023-03-06 DIAGNOSIS — S72142K Displaced intertrochanteric fracture of left femur, subsequent encounter for closed fracture with nonunion: Secondary | ICD-10-CM | POA: Diagnosis not present

## 2023-03-06 DIAGNOSIS — M6281 Muscle weakness (generalized): Secondary | ICD-10-CM | POA: Diagnosis not present

## 2023-03-06 DIAGNOSIS — R2681 Unsteadiness on feet: Secondary | ICD-10-CM | POA: Diagnosis not present

## 2023-03-06 DIAGNOSIS — M25562 Pain in left knee: Secondary | ICD-10-CM | POA: Diagnosis not present

## 2023-03-06 DIAGNOSIS — M25512 Pain in left shoulder: Secondary | ICD-10-CM | POA: Diagnosis not present

## 2023-03-06 DIAGNOSIS — M25552 Pain in left hip: Secondary | ICD-10-CM | POA: Diagnosis not present

## 2023-03-08 ENCOUNTER — Other Ambulatory Visit: Payer: Self-pay | Admitting: *Deleted

## 2023-03-08 ENCOUNTER — Encounter: Payer: Self-pay | Admitting: Orthopedic Surgery

## 2023-03-08 DIAGNOSIS — R519 Headache, unspecified: Secondary | ICD-10-CM

## 2023-03-08 DIAGNOSIS — I4891 Unspecified atrial fibrillation: Secondary | ICD-10-CM

## 2023-03-10 DIAGNOSIS — R300 Dysuria: Secondary | ICD-10-CM | POA: Diagnosis not present

## 2023-03-11 ENCOUNTER — Other Ambulatory Visit: Payer: Medicare HMO

## 2023-03-11 DIAGNOSIS — M25512 Pain in left shoulder: Secondary | ICD-10-CM | POA: Diagnosis not present

## 2023-03-11 DIAGNOSIS — M25552 Pain in left hip: Secondary | ICD-10-CM | POA: Diagnosis not present

## 2023-03-11 DIAGNOSIS — S72102D Unspecified trochanteric fracture of left femur, subsequent encounter for closed fracture with routine healing: Secondary | ICD-10-CM | POA: Diagnosis not present

## 2023-03-11 DIAGNOSIS — R339 Retention of urine, unspecified: Secondary | ICD-10-CM | POA: Diagnosis not present

## 2023-03-11 DIAGNOSIS — I10 Essential (primary) hypertension: Secondary | ICD-10-CM | POA: Diagnosis not present

## 2023-03-11 DIAGNOSIS — R2681 Unsteadiness on feet: Secondary | ICD-10-CM | POA: Diagnosis not present

## 2023-03-11 DIAGNOSIS — M6281 Muscle weakness (generalized): Secondary | ICD-10-CM | POA: Diagnosis not present

## 2023-03-11 DIAGNOSIS — F419 Anxiety disorder, unspecified: Secondary | ICD-10-CM | POA: Diagnosis not present

## 2023-03-11 DIAGNOSIS — M25562 Pain in left knee: Secondary | ICD-10-CM | POA: Diagnosis not present

## 2023-03-13 ENCOUNTER — Ambulatory Visit (INDEPENDENT_AMBULATORY_CARE_PROVIDER_SITE_OTHER): Payer: Medicare HMO | Admitting: Family Medicine

## 2023-03-13 VITALS — BP 133/57 | HR 70 | Temp 97.8°F | Ht 72.0 in | Wt 171.0 lb

## 2023-03-13 DIAGNOSIS — M25512 Pain in left shoulder: Secondary | ICD-10-CM | POA: Diagnosis not present

## 2023-03-13 DIAGNOSIS — I10 Essential (primary) hypertension: Secondary | ICD-10-CM

## 2023-03-13 DIAGNOSIS — R2681 Unsteadiness on feet: Secondary | ICD-10-CM | POA: Diagnosis not present

## 2023-03-13 DIAGNOSIS — M25552 Pain in left hip: Secondary | ICD-10-CM | POA: Diagnosis not present

## 2023-03-13 DIAGNOSIS — M25562 Pain in left knee: Secondary | ICD-10-CM | POA: Diagnosis not present

## 2023-03-13 DIAGNOSIS — Z23 Encounter for immunization: Secondary | ICD-10-CM

## 2023-03-13 DIAGNOSIS — M6281 Muscle weakness (generalized): Secondary | ICD-10-CM | POA: Diagnosis not present

## 2023-03-13 DIAGNOSIS — Z8781 Personal history of (healed) traumatic fracture: Secondary | ICD-10-CM | POA: Diagnosis not present

## 2023-03-13 DIAGNOSIS — R1111 Vomiting without nausea: Secondary | ICD-10-CM | POA: Diagnosis not present

## 2023-03-13 MED ORDER — PANTOPRAZOLE SODIUM 40 MG PO TBEC: 40.0000 mg | DELAYED_RELEASE_TABLET | Freq: Every day | ORAL | 0 refills | Status: DC

## 2023-03-13 NOTE — Assessment & Plan Note (Signed)
Blood pressure at goal today without meds. 

## 2023-03-13 NOTE — Progress Notes (Signed)
   Luis [name] is a 88 y.o. male who presents today for an office visit.  Assessment/Plan:  New/Acute Problems: Vomiting No current last couple weeks.  May have had reactive gastropathy or mild gastritis related to recent injuries and surgeries.  Reassuring exam today.  Will start Protonix 40 mg daily for the next 4 weeks.  He has Zofran to use as needed at home.  He will let us know if he has any recurrence and we can refer to GI.  Left shoulder pain Recently diagnosed with rotator cuff tear.  Following with orthopedics and has upcoming appointment.  Left hip fracture Doing well status post intramedullary nailing.  He will continue working with rehab.  Left knee pain Doing better status post meniscal repair.  He is working with PT.  Chronic Problems Addressed Today: HTN (hypertension) Blood pressure at goal today without meds.    Subjective:  HPI:  Patient here today for follow up.  He was last seen here about 10 months ago for annual physical. Since our last visit he suffered a mechanical fall at home that resulted in a left hip fracture. He was admitted and underwent UM nailing. He was discharged to skilled nursing facility.  He was there for a a couple months and discharged home to follow-up with home health.  He has been following with orthopedics since our last visit and ended up going to the left knee arthroscopy a few weeks ago to repair a meniscal tear in his left knee.  He has also been having some pain in his left shoulder and got an MRI last week that showed several areas of tearing in his rotator cuff.   He did have a few episodes of vomiting a couple of weeks ago. This happened without warning on the way to rehab. No abdominal pain. He does have constipation at baseline. Appetite has been a little bit down. No hearturn or reflux.        Objective:  Physical Exam: BP (!) 133/57   Pulse 70   Temp 97.8 F (36.6 C) (Temporal)   Ht 6' (1.829 m)   Wt 171 lb (77.6  kg)   SpO2 98%   BMI 23.19 kg/m   Gen: No acute distress, resting comfortably CV: Regular rate and rhythm with no murmurs appreciated Pulm: Normal work of breathing, clear to auscultation bilaterally with no crackles, wheezes, or rhonchi Neuro: Grossly normal, moves all extremities Psych: Normal affect and thought content  Time Spent: 45 minutes of total time was spent on the date of the encounter performing the following actions: chart review prior to seeing the patient including recent hospitalizations, obtaining history, performing a medically necessary exam, counseling on the treatment plan, placing orders, and documenting in our EHR.        Katina Degree. Jimmey Ralph, MD 03/13/2023 9:45 AM

## 2023-03-19 ENCOUNTER — Ambulatory Visit (HOSPITAL_COMMUNITY): Payer: Medicare HMO

## 2023-03-19 DIAGNOSIS — M25812 Other specified joint disorders, left shoulder: Secondary | ICD-10-CM | POA: Diagnosis not present

## 2023-03-20 DIAGNOSIS — M25552 Pain in left hip: Secondary | ICD-10-CM | POA: Diagnosis not present

## 2023-03-20 DIAGNOSIS — R2681 Unsteadiness on feet: Secondary | ICD-10-CM | POA: Diagnosis not present

## 2023-03-20 DIAGNOSIS — M25562 Pain in left knee: Secondary | ICD-10-CM | POA: Diagnosis not present

## 2023-03-20 DIAGNOSIS — M6281 Muscle weakness (generalized): Secondary | ICD-10-CM | POA: Diagnosis not present

## 2023-03-20 DIAGNOSIS — M25512 Pain in left shoulder: Secondary | ICD-10-CM | POA: Diagnosis not present

## 2023-03-22 DIAGNOSIS — R2681 Unsteadiness on feet: Secondary | ICD-10-CM | POA: Diagnosis not present

## 2023-03-22 DIAGNOSIS — M6281 Muscle weakness (generalized): Secondary | ICD-10-CM | POA: Diagnosis not present

## 2023-03-22 DIAGNOSIS — M25512 Pain in left shoulder: Secondary | ICD-10-CM | POA: Diagnosis not present

## 2023-03-22 DIAGNOSIS — M25562 Pain in left knee: Secondary | ICD-10-CM | POA: Diagnosis not present

## 2023-03-22 DIAGNOSIS — M25552 Pain in left hip: Secondary | ICD-10-CM | POA: Diagnosis not present

## 2023-03-26 DIAGNOSIS — M25812 Other specified joint disorders, left shoulder: Secondary | ICD-10-CM | POA: Diagnosis not present

## 2023-03-26 DIAGNOSIS — R269 Unspecified abnormalities of gait and mobility: Secondary | ICD-10-CM | POA: Diagnosis not present

## 2023-03-26 DIAGNOSIS — M25512 Pain in left shoulder: Secondary | ICD-10-CM | POA: Diagnosis not present

## 2023-03-26 DIAGNOSIS — M25562 Pain in left knee: Secondary | ICD-10-CM | POA: Diagnosis not present

## 2023-03-27 DIAGNOSIS — M25512 Pain in left shoulder: Secondary | ICD-10-CM | POA: Diagnosis not present

## 2023-03-27 DIAGNOSIS — M25812 Other specified joint disorders, left shoulder: Secondary | ICD-10-CM | POA: Diagnosis not present

## 2023-03-27 DIAGNOSIS — R269 Unspecified abnormalities of gait and mobility: Secondary | ICD-10-CM | POA: Diagnosis not present

## 2023-03-27 DIAGNOSIS — M25562 Pain in left knee: Secondary | ICD-10-CM | POA: Diagnosis not present

## 2023-04-03 DIAGNOSIS — R269 Unspecified abnormalities of gait and mobility: Secondary | ICD-10-CM | POA: Diagnosis not present

## 2023-04-03 DIAGNOSIS — M25562 Pain in left knee: Secondary | ICD-10-CM | POA: Diagnosis not present

## 2023-04-03 DIAGNOSIS — M25812 Other specified joint disorders, left shoulder: Secondary | ICD-10-CM | POA: Diagnosis not present

## 2023-04-03 DIAGNOSIS — M25512 Pain in left shoulder: Secondary | ICD-10-CM | POA: Diagnosis not present

## 2023-04-05 DIAGNOSIS — R269 Unspecified abnormalities of gait and mobility: Secondary | ICD-10-CM | POA: Diagnosis not present

## 2023-04-05 DIAGNOSIS — M25562 Pain in left knee: Secondary | ICD-10-CM | POA: Diagnosis not present

## 2023-04-05 DIAGNOSIS — M25512 Pain in left shoulder: Secondary | ICD-10-CM | POA: Diagnosis not present

## 2023-04-05 DIAGNOSIS — M25812 Other specified joint disorders, left shoulder: Secondary | ICD-10-CM | POA: Diagnosis not present

## 2023-04-08 DIAGNOSIS — M25512 Pain in left shoulder: Secondary | ICD-10-CM | POA: Diagnosis not present

## 2023-04-08 DIAGNOSIS — M25562 Pain in left knee: Secondary | ICD-10-CM | POA: Diagnosis not present

## 2023-04-08 DIAGNOSIS — M25812 Other specified joint disorders, left shoulder: Secondary | ICD-10-CM | POA: Diagnosis not present

## 2023-04-08 DIAGNOSIS — R269 Unspecified abnormalities of gait and mobility: Secondary | ICD-10-CM | POA: Diagnosis not present

## 2023-04-10 ENCOUNTER — Other Ambulatory Visit: Payer: Self-pay | Admitting: Orthopedic Surgery

## 2023-04-10 DIAGNOSIS — R269 Unspecified abnormalities of gait and mobility: Secondary | ICD-10-CM | POA: Diagnosis not present

## 2023-04-10 DIAGNOSIS — M25812 Other specified joint disorders, left shoulder: Secondary | ICD-10-CM | POA: Diagnosis not present

## 2023-04-10 DIAGNOSIS — S72142K Displaced intertrochanteric fracture of left femur, subsequent encounter for closed fracture with nonunion: Secondary | ICD-10-CM

## 2023-04-10 DIAGNOSIS — S72102D Unspecified trochanteric fracture of left femur, subsequent encounter for closed fracture with routine healing: Secondary | ICD-10-CM | POA: Diagnosis not present

## 2023-04-10 DIAGNOSIS — I10 Essential (primary) hypertension: Secondary | ICD-10-CM | POA: Diagnosis not present

## 2023-04-10 DIAGNOSIS — F419 Anxiety disorder, unspecified: Secondary | ICD-10-CM | POA: Diagnosis not present

## 2023-04-10 DIAGNOSIS — M25512 Pain in left shoulder: Secondary | ICD-10-CM | POA: Diagnosis not present

## 2023-04-10 DIAGNOSIS — M25562 Pain in left knee: Secondary | ICD-10-CM | POA: Diagnosis not present

## 2023-04-10 DIAGNOSIS — R339 Retention of urine, unspecified: Secondary | ICD-10-CM | POA: Diagnosis not present

## 2023-04-15 DIAGNOSIS — M25512 Pain in left shoulder: Secondary | ICD-10-CM | POA: Diagnosis not present

## 2023-04-15 DIAGNOSIS — M25562 Pain in left knee: Secondary | ICD-10-CM | POA: Diagnosis not present

## 2023-04-15 DIAGNOSIS — R269 Unspecified abnormalities of gait and mobility: Secondary | ICD-10-CM | POA: Diagnosis not present

## 2023-04-15 DIAGNOSIS — M25812 Other specified joint disorders, left shoulder: Secondary | ICD-10-CM | POA: Diagnosis not present

## 2023-04-19 DIAGNOSIS — M25562 Pain in left knee: Secondary | ICD-10-CM | POA: Diagnosis not present

## 2023-04-19 DIAGNOSIS — M25812 Other specified joint disorders, left shoulder: Secondary | ICD-10-CM | POA: Diagnosis not present

## 2023-04-19 DIAGNOSIS — R269 Unspecified abnormalities of gait and mobility: Secondary | ICD-10-CM | POA: Diagnosis not present

## 2023-04-19 DIAGNOSIS — M25512 Pain in left shoulder: Secondary | ICD-10-CM | POA: Diagnosis not present

## 2023-04-22 DIAGNOSIS — M25512 Pain in left shoulder: Secondary | ICD-10-CM | POA: Diagnosis not present

## 2023-04-22 DIAGNOSIS — M25812 Other specified joint disorders, left shoulder: Secondary | ICD-10-CM | POA: Diagnosis not present

## 2023-04-22 DIAGNOSIS — R269 Unspecified abnormalities of gait and mobility: Secondary | ICD-10-CM | POA: Diagnosis not present

## 2023-04-22 DIAGNOSIS — M25562 Pain in left knee: Secondary | ICD-10-CM | POA: Diagnosis not present

## 2023-04-23 ENCOUNTER — Other Ambulatory Visit: Payer: Self-pay | Admitting: Cardiovascular Disease

## 2023-04-24 ENCOUNTER — Encounter (HOSPITAL_COMMUNITY): Payer: Self-pay

## 2023-04-24 ENCOUNTER — Ambulatory Visit (HOSPITAL_COMMUNITY): Payer: Medicare HMO | Attending: Cardiology

## 2023-04-24 LAB — PSA: PSA: 10.24

## 2023-04-26 DIAGNOSIS — R269 Unspecified abnormalities of gait and mobility: Secondary | ICD-10-CM | POA: Diagnosis not present

## 2023-04-26 DIAGNOSIS — M25812 Other specified joint disorders, left shoulder: Secondary | ICD-10-CM | POA: Diagnosis not present

## 2023-04-26 DIAGNOSIS — M25562 Pain in left knee: Secondary | ICD-10-CM | POA: Diagnosis not present

## 2023-04-26 DIAGNOSIS — M25512 Pain in left shoulder: Secondary | ICD-10-CM | POA: Diagnosis not present

## 2023-04-29 DIAGNOSIS — R269 Unspecified abnormalities of gait and mobility: Secondary | ICD-10-CM | POA: Diagnosis not present

## 2023-04-29 DIAGNOSIS — M25812 Other specified joint disorders, left shoulder: Secondary | ICD-10-CM | POA: Diagnosis not present

## 2023-04-29 DIAGNOSIS — M25512 Pain in left shoulder: Secondary | ICD-10-CM | POA: Diagnosis not present

## 2023-04-29 DIAGNOSIS — M25562 Pain in left knee: Secondary | ICD-10-CM | POA: Diagnosis not present

## 2023-05-01 DIAGNOSIS — R269 Unspecified abnormalities of gait and mobility: Secondary | ICD-10-CM | POA: Diagnosis not present

## 2023-05-01 DIAGNOSIS — M25812 Other specified joint disorders, left shoulder: Secondary | ICD-10-CM | POA: Diagnosis not present

## 2023-05-01 DIAGNOSIS — M25562 Pain in left knee: Secondary | ICD-10-CM | POA: Diagnosis not present

## 2023-05-01 DIAGNOSIS — M25512 Pain in left shoulder: Secondary | ICD-10-CM | POA: Diagnosis not present

## 2023-05-06 DIAGNOSIS — R269 Unspecified abnormalities of gait and mobility: Secondary | ICD-10-CM | POA: Diagnosis not present

## 2023-05-06 DIAGNOSIS — M25812 Other specified joint disorders, left shoulder: Secondary | ICD-10-CM | POA: Diagnosis not present

## 2023-05-06 DIAGNOSIS — M25512 Pain in left shoulder: Secondary | ICD-10-CM | POA: Diagnosis not present

## 2023-05-06 DIAGNOSIS — M25562 Pain in left knee: Secondary | ICD-10-CM | POA: Diagnosis not present

## 2023-05-08 DIAGNOSIS — R972 Elevated prostate specific antigen [PSA]: Secondary | ICD-10-CM | POA: Diagnosis not present

## 2023-05-08 DIAGNOSIS — N401 Enlarged prostate with lower urinary tract symptoms: Secondary | ICD-10-CM | POA: Diagnosis not present

## 2023-05-08 DIAGNOSIS — R3915 Urgency of urination: Secondary | ICD-10-CM | POA: Diagnosis not present

## 2023-05-08 DIAGNOSIS — R3914 Feeling of incomplete bladder emptying: Secondary | ICD-10-CM | POA: Diagnosis not present

## 2023-05-11 DIAGNOSIS — F419 Anxiety disorder, unspecified: Secondary | ICD-10-CM | POA: Diagnosis not present

## 2023-05-11 DIAGNOSIS — S72102D Unspecified trochanteric fracture of left femur, subsequent encounter for closed fracture with routine healing: Secondary | ICD-10-CM | POA: Diagnosis not present

## 2023-05-11 DIAGNOSIS — I10 Essential (primary) hypertension: Secondary | ICD-10-CM | POA: Diagnosis not present

## 2023-05-11 DIAGNOSIS — R339 Retention of urine, unspecified: Secondary | ICD-10-CM | POA: Diagnosis not present

## 2023-05-13 DIAGNOSIS — R269 Unspecified abnormalities of gait and mobility: Secondary | ICD-10-CM | POA: Diagnosis not present

## 2023-05-13 DIAGNOSIS — M25512 Pain in left shoulder: Secondary | ICD-10-CM | POA: Diagnosis not present

## 2023-05-13 DIAGNOSIS — M25812 Other specified joint disorders, left shoulder: Secondary | ICD-10-CM | POA: Diagnosis not present

## 2023-05-13 DIAGNOSIS — M25562 Pain in left knee: Secondary | ICD-10-CM | POA: Diagnosis not present

## 2023-05-14 ENCOUNTER — Encounter: Payer: Self-pay | Admitting: Family Medicine

## 2023-05-15 DIAGNOSIS — R269 Unspecified abnormalities of gait and mobility: Secondary | ICD-10-CM | POA: Diagnosis not present

## 2023-05-15 DIAGNOSIS — M25812 Other specified joint disorders, left shoulder: Secondary | ICD-10-CM | POA: Diagnosis not present

## 2023-05-15 DIAGNOSIS — M25562 Pain in left knee: Secondary | ICD-10-CM | POA: Diagnosis not present

## 2023-05-15 DIAGNOSIS — M25512 Pain in left shoulder: Secondary | ICD-10-CM | POA: Diagnosis not present

## 2023-05-20 DIAGNOSIS — M25812 Other specified joint disorders, left shoulder: Secondary | ICD-10-CM | POA: Diagnosis not present

## 2023-05-20 DIAGNOSIS — R269 Unspecified abnormalities of gait and mobility: Secondary | ICD-10-CM | POA: Diagnosis not present

## 2023-05-20 DIAGNOSIS — M25562 Pain in left knee: Secondary | ICD-10-CM | POA: Diagnosis not present

## 2023-05-20 DIAGNOSIS — M25512 Pain in left shoulder: Secondary | ICD-10-CM | POA: Diagnosis not present

## 2023-05-21 DIAGNOSIS — R269 Unspecified abnormalities of gait and mobility: Secondary | ICD-10-CM | POA: Diagnosis not present

## 2023-05-21 DIAGNOSIS — M25562 Pain in left knee: Secondary | ICD-10-CM | POA: Diagnosis not present

## 2023-05-21 DIAGNOSIS — M25812 Other specified joint disorders, left shoulder: Secondary | ICD-10-CM | POA: Diagnosis not present

## 2023-05-21 DIAGNOSIS — M25512 Pain in left shoulder: Secondary | ICD-10-CM | POA: Diagnosis not present

## 2023-05-22 DIAGNOSIS — M25812 Other specified joint disorders, left shoulder: Secondary | ICD-10-CM | POA: Diagnosis not present

## 2023-05-29 DIAGNOSIS — M25562 Pain in left knee: Secondary | ICD-10-CM | POA: Diagnosis not present

## 2023-05-29 DIAGNOSIS — R269 Unspecified abnormalities of gait and mobility: Secondary | ICD-10-CM | POA: Diagnosis not present

## 2023-05-29 DIAGNOSIS — M25512 Pain in left shoulder: Secondary | ICD-10-CM | POA: Diagnosis not present

## 2023-05-29 DIAGNOSIS — M25812 Other specified joint disorders, left shoulder: Secondary | ICD-10-CM | POA: Diagnosis not present

## 2023-06-07 DIAGNOSIS — M25812 Other specified joint disorders, left shoulder: Secondary | ICD-10-CM | POA: Diagnosis not present

## 2023-06-07 DIAGNOSIS — M25562 Pain in left knee: Secondary | ICD-10-CM | POA: Diagnosis not present

## 2023-06-07 DIAGNOSIS — R269 Unspecified abnormalities of gait and mobility: Secondary | ICD-10-CM | POA: Diagnosis not present

## 2023-06-07 DIAGNOSIS — M25512 Pain in left shoulder: Secondary | ICD-10-CM | POA: Diagnosis not present

## 2023-06-10 DIAGNOSIS — S72102D Unspecified trochanteric fracture of left femur, subsequent encounter for closed fracture with routine healing: Secondary | ICD-10-CM | POA: Diagnosis not present

## 2023-06-10 DIAGNOSIS — F419 Anxiety disorder, unspecified: Secondary | ICD-10-CM | POA: Diagnosis not present

## 2023-06-10 DIAGNOSIS — R339 Retention of urine, unspecified: Secondary | ICD-10-CM | POA: Diagnosis not present

## 2023-06-10 DIAGNOSIS — I10 Essential (primary) hypertension: Secondary | ICD-10-CM | POA: Diagnosis not present

## 2023-06-26 DIAGNOSIS — M25562 Pain in left knee: Secondary | ICD-10-CM | POA: Diagnosis not present

## 2023-06-26 DIAGNOSIS — R269 Unspecified abnormalities of gait and mobility: Secondary | ICD-10-CM | POA: Diagnosis not present

## 2023-06-26 DIAGNOSIS — M25512 Pain in left shoulder: Secondary | ICD-10-CM | POA: Diagnosis not present

## 2023-06-26 DIAGNOSIS — M25812 Other specified joint disorders, left shoulder: Secondary | ICD-10-CM | POA: Diagnosis not present

## 2023-06-28 DIAGNOSIS — M25812 Other specified joint disorders, left shoulder: Secondary | ICD-10-CM | POA: Diagnosis not present

## 2023-06-28 DIAGNOSIS — R269 Unspecified abnormalities of gait and mobility: Secondary | ICD-10-CM | POA: Diagnosis not present

## 2023-06-28 DIAGNOSIS — M25562 Pain in left knee: Secondary | ICD-10-CM | POA: Diagnosis not present

## 2023-06-28 DIAGNOSIS — M25512 Pain in left shoulder: Secondary | ICD-10-CM | POA: Diagnosis not present

## 2023-07-03 DIAGNOSIS — R269 Unspecified abnormalities of gait and mobility: Secondary | ICD-10-CM | POA: Diagnosis not present

## 2023-07-03 DIAGNOSIS — M25562 Pain in left knee: Secondary | ICD-10-CM | POA: Diagnosis not present

## 2023-07-03 DIAGNOSIS — M25812 Other specified joint disorders, left shoulder: Secondary | ICD-10-CM | POA: Diagnosis not present

## 2023-07-03 DIAGNOSIS — M25512 Pain in left shoulder: Secondary | ICD-10-CM | POA: Diagnosis not present

## 2023-07-10 DIAGNOSIS — M23222 Derangement of posterior horn of medial meniscus due to old tear or injury, left knee: Secondary | ICD-10-CM | POA: Diagnosis not present

## 2023-07-10 DIAGNOSIS — S72142K Displaced intertrochanteric fracture of left femur, subsequent encounter for closed fracture with nonunion: Secondary | ICD-10-CM | POA: Diagnosis not present

## 2023-07-10 DIAGNOSIS — R269 Unspecified abnormalities of gait and mobility: Secondary | ICD-10-CM | POA: Diagnosis not present

## 2023-07-10 DIAGNOSIS — M25562 Pain in left knee: Secondary | ICD-10-CM | POA: Diagnosis not present

## 2023-07-10 DIAGNOSIS — M25512 Pain in left shoulder: Secondary | ICD-10-CM | POA: Diagnosis not present

## 2023-07-10 DIAGNOSIS — M23242 Derangement of anterior horn of lateral meniscus due to old tear or injury, left knee: Secondary | ICD-10-CM | POA: Diagnosis not present

## 2023-07-10 DIAGNOSIS — M25812 Other specified joint disorders, left shoulder: Secondary | ICD-10-CM | POA: Diagnosis not present

## 2023-07-11 DIAGNOSIS — M25512 Pain in left shoulder: Secondary | ICD-10-CM | POA: Diagnosis not present

## 2023-07-11 DIAGNOSIS — R269 Unspecified abnormalities of gait and mobility: Secondary | ICD-10-CM | POA: Diagnosis not present

## 2023-07-11 DIAGNOSIS — M25812 Other specified joint disorders, left shoulder: Secondary | ICD-10-CM | POA: Diagnosis not present

## 2023-07-11 DIAGNOSIS — M25562 Pain in left knee: Secondary | ICD-10-CM | POA: Diagnosis not present

## 2023-07-11 DIAGNOSIS — F419 Anxiety disorder, unspecified: Secondary | ICD-10-CM | POA: Diagnosis not present

## 2023-07-11 DIAGNOSIS — S72102D Unspecified trochanteric fracture of left femur, subsequent encounter for closed fracture with routine healing: Secondary | ICD-10-CM | POA: Diagnosis not present

## 2023-07-11 DIAGNOSIS — R339 Retention of urine, unspecified: Secondary | ICD-10-CM | POA: Diagnosis not present

## 2023-07-11 DIAGNOSIS — I10 Essential (primary) hypertension: Secondary | ICD-10-CM | POA: Diagnosis not present

## 2023-07-15 DIAGNOSIS — M25512 Pain in left shoulder: Secondary | ICD-10-CM | POA: Diagnosis not present

## 2023-07-15 DIAGNOSIS — M25562 Pain in left knee: Secondary | ICD-10-CM | POA: Diagnosis not present

## 2023-07-15 DIAGNOSIS — M25812 Other specified joint disorders, left shoulder: Secondary | ICD-10-CM | POA: Diagnosis not present

## 2023-07-15 DIAGNOSIS — R269 Unspecified abnormalities of gait and mobility: Secondary | ICD-10-CM | POA: Diagnosis not present

## 2023-07-17 DIAGNOSIS — M25562 Pain in left knee: Secondary | ICD-10-CM | POA: Diagnosis not present

## 2023-07-17 DIAGNOSIS — M25812 Other specified joint disorders, left shoulder: Secondary | ICD-10-CM | POA: Diagnosis not present

## 2023-07-17 DIAGNOSIS — M25512 Pain in left shoulder: Secondary | ICD-10-CM | POA: Diagnosis not present

## 2023-07-17 DIAGNOSIS — R269 Unspecified abnormalities of gait and mobility: Secondary | ICD-10-CM | POA: Diagnosis not present

## 2023-07-22 DIAGNOSIS — M25512 Pain in left shoulder: Secondary | ICD-10-CM | POA: Diagnosis not present

## 2023-07-22 DIAGNOSIS — M25562 Pain in left knee: Secondary | ICD-10-CM | POA: Diagnosis not present

## 2023-07-22 DIAGNOSIS — R269 Unspecified abnormalities of gait and mobility: Secondary | ICD-10-CM | POA: Diagnosis not present

## 2023-07-22 DIAGNOSIS — M25812 Other specified joint disorders, left shoulder: Secondary | ICD-10-CM | POA: Diagnosis not present

## 2023-07-22 NOTE — Telephone Encounter (Signed)
 Called and LVM informing pt to call back to make an appointment for a CPE.

## 2023-07-24 DIAGNOSIS — M25512 Pain in left shoulder: Secondary | ICD-10-CM | POA: Diagnosis not present

## 2023-07-24 DIAGNOSIS — M25812 Other specified joint disorders, left shoulder: Secondary | ICD-10-CM | POA: Diagnosis not present

## 2023-07-24 DIAGNOSIS — R269 Unspecified abnormalities of gait and mobility: Secondary | ICD-10-CM | POA: Diagnosis not present

## 2023-07-24 DIAGNOSIS — M25562 Pain in left knee: Secondary | ICD-10-CM | POA: Diagnosis not present

## 2023-07-29 DIAGNOSIS — M25562 Pain in left knee: Secondary | ICD-10-CM | POA: Diagnosis not present

## 2023-07-29 DIAGNOSIS — M25512 Pain in left shoulder: Secondary | ICD-10-CM | POA: Diagnosis not present

## 2023-07-29 DIAGNOSIS — M25812 Other specified joint disorders, left shoulder: Secondary | ICD-10-CM | POA: Diagnosis not present

## 2023-07-29 DIAGNOSIS — R269 Unspecified abnormalities of gait and mobility: Secondary | ICD-10-CM | POA: Diagnosis not present

## 2023-08-02 DIAGNOSIS — M25562 Pain in left knee: Secondary | ICD-10-CM | POA: Diagnosis not present

## 2023-08-02 DIAGNOSIS — M25812 Other specified joint disorders, left shoulder: Secondary | ICD-10-CM | POA: Diagnosis not present

## 2023-08-02 DIAGNOSIS — R269 Unspecified abnormalities of gait and mobility: Secondary | ICD-10-CM | POA: Diagnosis not present

## 2023-08-02 DIAGNOSIS — M25512 Pain in left shoulder: Secondary | ICD-10-CM | POA: Diagnosis not present

## 2023-08-05 DIAGNOSIS — M25562 Pain in left knee: Secondary | ICD-10-CM | POA: Diagnosis not present

## 2023-08-05 DIAGNOSIS — R269 Unspecified abnormalities of gait and mobility: Secondary | ICD-10-CM | POA: Diagnosis not present

## 2023-08-05 DIAGNOSIS — M25512 Pain in left shoulder: Secondary | ICD-10-CM | POA: Diagnosis not present

## 2023-08-05 DIAGNOSIS — M25812 Other specified joint disorders, left shoulder: Secondary | ICD-10-CM | POA: Diagnosis not present

## 2023-08-07 DIAGNOSIS — M25512 Pain in left shoulder: Secondary | ICD-10-CM | POA: Diagnosis not present

## 2023-08-07 DIAGNOSIS — M25562 Pain in left knee: Secondary | ICD-10-CM | POA: Diagnosis not present

## 2023-08-07 DIAGNOSIS — R269 Unspecified abnormalities of gait and mobility: Secondary | ICD-10-CM | POA: Diagnosis not present

## 2023-08-07 DIAGNOSIS — M25812 Other specified joint disorders, left shoulder: Secondary | ICD-10-CM | POA: Diagnosis not present

## 2023-08-09 DIAGNOSIS — F419 Anxiety disorder, unspecified: Secondary | ICD-10-CM | POA: Diagnosis not present

## 2023-08-09 DIAGNOSIS — S72102D Unspecified trochanteric fracture of left femur, subsequent encounter for closed fracture with routine healing: Secondary | ICD-10-CM | POA: Diagnosis not present

## 2023-08-09 DIAGNOSIS — R339 Retention of urine, unspecified: Secondary | ICD-10-CM | POA: Diagnosis not present

## 2023-08-09 DIAGNOSIS — I10 Essential (primary) hypertension: Secondary | ICD-10-CM | POA: Diagnosis not present

## 2023-08-12 DIAGNOSIS — M25562 Pain in left knee: Secondary | ICD-10-CM | POA: Diagnosis not present

## 2023-08-12 DIAGNOSIS — M25512 Pain in left shoulder: Secondary | ICD-10-CM | POA: Diagnosis not present

## 2023-08-12 DIAGNOSIS — R269 Unspecified abnormalities of gait and mobility: Secondary | ICD-10-CM | POA: Diagnosis not present

## 2023-08-12 DIAGNOSIS — M25812 Other specified joint disorders, left shoulder: Secondary | ICD-10-CM | POA: Diagnosis not present

## 2023-08-19 DIAGNOSIS — M25562 Pain in left knee: Secondary | ICD-10-CM | POA: Diagnosis not present

## 2023-08-19 DIAGNOSIS — M25512 Pain in left shoulder: Secondary | ICD-10-CM | POA: Diagnosis not present

## 2023-08-19 DIAGNOSIS — M25812 Other specified joint disorders, left shoulder: Secondary | ICD-10-CM | POA: Diagnosis not present

## 2023-08-19 DIAGNOSIS — R269 Unspecified abnormalities of gait and mobility: Secondary | ICD-10-CM | POA: Diagnosis not present

## 2023-08-21 DIAGNOSIS — M25562 Pain in left knee: Secondary | ICD-10-CM | POA: Diagnosis not present

## 2023-08-21 DIAGNOSIS — M25512 Pain in left shoulder: Secondary | ICD-10-CM | POA: Diagnosis not present

## 2023-08-21 DIAGNOSIS — R269 Unspecified abnormalities of gait and mobility: Secondary | ICD-10-CM | POA: Diagnosis not present

## 2023-08-21 DIAGNOSIS — M25812 Other specified joint disorders, left shoulder: Secondary | ICD-10-CM | POA: Diagnosis not present

## 2023-08-28 DIAGNOSIS — M25812 Other specified joint disorders, left shoulder: Secondary | ICD-10-CM | POA: Diagnosis not present

## 2023-08-28 DIAGNOSIS — M25562 Pain in left knee: Secondary | ICD-10-CM | POA: Diagnosis not present

## 2023-08-28 DIAGNOSIS — R269 Unspecified abnormalities of gait and mobility: Secondary | ICD-10-CM | POA: Diagnosis not present

## 2023-08-28 DIAGNOSIS — M25512 Pain in left shoulder: Secondary | ICD-10-CM | POA: Diagnosis not present

## 2023-08-29 ENCOUNTER — Other Ambulatory Visit: Payer: Self-pay | Admitting: Cardiovascular Disease

## 2023-09-02 DIAGNOSIS — M25812 Other specified joint disorders, left shoulder: Secondary | ICD-10-CM | POA: Diagnosis not present

## 2023-09-02 DIAGNOSIS — R269 Unspecified abnormalities of gait and mobility: Secondary | ICD-10-CM | POA: Diagnosis not present

## 2023-09-02 DIAGNOSIS — M25512 Pain in left shoulder: Secondary | ICD-10-CM | POA: Diagnosis not present

## 2023-09-02 DIAGNOSIS — M25562 Pain in left knee: Secondary | ICD-10-CM | POA: Diagnosis not present

## 2023-09-04 DIAGNOSIS — M25512 Pain in left shoulder: Secondary | ICD-10-CM | POA: Diagnosis not present

## 2023-09-04 DIAGNOSIS — M25812 Other specified joint disorders, left shoulder: Secondary | ICD-10-CM | POA: Diagnosis not present

## 2023-09-04 DIAGNOSIS — R269 Unspecified abnormalities of gait and mobility: Secondary | ICD-10-CM | POA: Diagnosis not present

## 2023-09-04 DIAGNOSIS — M25562 Pain in left knee: Secondary | ICD-10-CM | POA: Diagnosis not present

## 2023-09-08 DIAGNOSIS — F419 Anxiety disorder, unspecified: Secondary | ICD-10-CM | POA: Diagnosis not present

## 2023-09-08 DIAGNOSIS — I10 Essential (primary) hypertension: Secondary | ICD-10-CM | POA: Diagnosis not present

## 2023-09-08 DIAGNOSIS — S72102D Unspecified trochanteric fracture of left femur, subsequent encounter for closed fracture with routine healing: Secondary | ICD-10-CM | POA: Diagnosis not present

## 2023-09-08 DIAGNOSIS — R339 Retention of urine, unspecified: Secondary | ICD-10-CM | POA: Diagnosis not present

## 2023-09-11 DIAGNOSIS — M25562 Pain in left knee: Secondary | ICD-10-CM | POA: Diagnosis not present

## 2023-09-11 DIAGNOSIS — M25812 Other specified joint disorders, left shoulder: Secondary | ICD-10-CM | POA: Diagnosis not present

## 2023-09-11 DIAGNOSIS — R269 Unspecified abnormalities of gait and mobility: Secondary | ICD-10-CM | POA: Diagnosis not present

## 2023-09-11 DIAGNOSIS — M25512 Pain in left shoulder: Secondary | ICD-10-CM | POA: Diagnosis not present

## 2023-09-13 DIAGNOSIS — C61 Malignant neoplasm of prostate: Secondary | ICD-10-CM | POA: Diagnosis not present

## 2023-09-13 DIAGNOSIS — M25512 Pain in left shoulder: Secondary | ICD-10-CM | POA: Diagnosis not present

## 2023-09-13 DIAGNOSIS — R269 Unspecified abnormalities of gait and mobility: Secondary | ICD-10-CM | POA: Diagnosis not present

## 2023-09-13 DIAGNOSIS — M25562 Pain in left knee: Secondary | ICD-10-CM | POA: Diagnosis not present

## 2023-09-13 DIAGNOSIS — M25812 Other specified joint disorders, left shoulder: Secondary | ICD-10-CM | POA: Diagnosis not present

## 2023-09-13 DIAGNOSIS — Z0001 Encounter for general adult medical examination with abnormal findings: Secondary | ICD-10-CM | POA: Diagnosis not present

## 2023-09-18 DIAGNOSIS — M25512 Pain in left shoulder: Secondary | ICD-10-CM | POA: Diagnosis not present

## 2023-09-18 DIAGNOSIS — M25812 Other specified joint disorders, left shoulder: Secondary | ICD-10-CM | POA: Diagnosis not present

## 2023-09-18 DIAGNOSIS — M25562 Pain in left knee: Secondary | ICD-10-CM | POA: Diagnosis not present

## 2023-09-18 DIAGNOSIS — R269 Unspecified abnormalities of gait and mobility: Secondary | ICD-10-CM | POA: Diagnosis not present

## 2023-09-19 DIAGNOSIS — M25562 Pain in left knee: Secondary | ICD-10-CM | POA: Diagnosis not present

## 2023-09-19 DIAGNOSIS — R269 Unspecified abnormalities of gait and mobility: Secondary | ICD-10-CM | POA: Diagnosis not present

## 2023-09-19 DIAGNOSIS — M25512 Pain in left shoulder: Secondary | ICD-10-CM | POA: Diagnosis not present

## 2023-09-19 DIAGNOSIS — M25812 Other specified joint disorders, left shoulder: Secondary | ICD-10-CM | POA: Diagnosis not present

## 2023-09-23 DIAGNOSIS — M25512 Pain in left shoulder: Secondary | ICD-10-CM | POA: Diagnosis not present

## 2023-09-23 DIAGNOSIS — M25562 Pain in left knee: Secondary | ICD-10-CM | POA: Diagnosis not present

## 2023-09-23 DIAGNOSIS — M25812 Other specified joint disorders, left shoulder: Secondary | ICD-10-CM | POA: Diagnosis not present

## 2023-09-23 DIAGNOSIS — R269 Unspecified abnormalities of gait and mobility: Secondary | ICD-10-CM | POA: Diagnosis not present

## 2023-09-25 DIAGNOSIS — R269 Unspecified abnormalities of gait and mobility: Secondary | ICD-10-CM | POA: Diagnosis not present

## 2023-09-25 DIAGNOSIS — M25812 Other specified joint disorders, left shoulder: Secondary | ICD-10-CM | POA: Diagnosis not present

## 2023-09-25 DIAGNOSIS — M25562 Pain in left knee: Secondary | ICD-10-CM | POA: Diagnosis not present

## 2023-09-25 DIAGNOSIS — M25512 Pain in left shoulder: Secondary | ICD-10-CM | POA: Diagnosis not present

## 2023-10-03 ENCOUNTER — Other Ambulatory Visit: Payer: Self-pay | Admitting: Cardiovascular Disease

## 2023-10-04 ENCOUNTER — Other Ambulatory Visit: Payer: Self-pay | Admitting: Cardiovascular Disease

## 2023-10-04 DIAGNOSIS — M25562 Pain in left knee: Secondary | ICD-10-CM | POA: Diagnosis not present

## 2023-10-04 DIAGNOSIS — M25812 Other specified joint disorders, left shoulder: Secondary | ICD-10-CM | POA: Diagnosis not present

## 2023-10-04 DIAGNOSIS — M25512 Pain in left shoulder: Secondary | ICD-10-CM | POA: Diagnosis not present

## 2023-10-04 DIAGNOSIS — R269 Unspecified abnormalities of gait and mobility: Secondary | ICD-10-CM | POA: Diagnosis not present

## 2023-10-09 DIAGNOSIS — M25812 Other specified joint disorders, left shoulder: Secondary | ICD-10-CM | POA: Diagnosis not present

## 2023-10-09 DIAGNOSIS — M25512 Pain in left shoulder: Secondary | ICD-10-CM | POA: Diagnosis not present

## 2023-10-09 DIAGNOSIS — I10 Essential (primary) hypertension: Secondary | ICD-10-CM | POA: Diagnosis not present

## 2023-10-09 DIAGNOSIS — R339 Retention of urine, unspecified: Secondary | ICD-10-CM | POA: Diagnosis not present

## 2023-10-09 DIAGNOSIS — M25562 Pain in left knee: Secondary | ICD-10-CM | POA: Diagnosis not present

## 2023-10-09 DIAGNOSIS — S72102D Unspecified trochanteric fracture of left femur, subsequent encounter for closed fracture with routine healing: Secondary | ICD-10-CM | POA: Diagnosis not present

## 2023-10-09 DIAGNOSIS — F419 Anxiety disorder, unspecified: Secondary | ICD-10-CM | POA: Diagnosis not present

## 2023-10-09 DIAGNOSIS — R269 Unspecified abnormalities of gait and mobility: Secondary | ICD-10-CM | POA: Diagnosis not present

## 2023-10-10 DIAGNOSIS — H26493 Other secondary cataract, bilateral: Secondary | ICD-10-CM | POA: Diagnosis not present

## 2023-10-10 DIAGNOSIS — H52201 Unspecified astigmatism, right eye: Secondary | ICD-10-CM | POA: Diagnosis not present

## 2023-10-10 DIAGNOSIS — H5201 Hypermetropia, right eye: Secondary | ICD-10-CM | POA: Diagnosis not present

## 2023-10-10 DIAGNOSIS — H40023 Open angle with borderline findings, high risk, bilateral: Secondary | ICD-10-CM | POA: Diagnosis not present

## 2023-10-10 DIAGNOSIS — H35371 Puckering of macula, right eye: Secondary | ICD-10-CM | POA: Diagnosis not present

## 2023-10-10 DIAGNOSIS — H31002 Unspecified chorioretinal scars, left eye: Secondary | ICD-10-CM | POA: Diagnosis not present

## 2023-10-11 DIAGNOSIS — M25512 Pain in left shoulder: Secondary | ICD-10-CM | POA: Diagnosis not present

## 2023-10-11 DIAGNOSIS — M25812 Other specified joint disorders, left shoulder: Secondary | ICD-10-CM | POA: Diagnosis not present

## 2023-10-11 DIAGNOSIS — M25562 Pain in left knee: Secondary | ICD-10-CM | POA: Diagnosis not present

## 2023-10-11 DIAGNOSIS — R269 Unspecified abnormalities of gait and mobility: Secondary | ICD-10-CM | POA: Diagnosis not present

## 2023-10-16 DIAGNOSIS — R269 Unspecified abnormalities of gait and mobility: Secondary | ICD-10-CM | POA: Diagnosis not present

## 2023-10-16 DIAGNOSIS — M25562 Pain in left knee: Secondary | ICD-10-CM | POA: Diagnosis not present

## 2023-10-16 DIAGNOSIS — M25812 Other specified joint disorders, left shoulder: Secondary | ICD-10-CM | POA: Diagnosis not present

## 2023-10-16 DIAGNOSIS — M25512 Pain in left shoulder: Secondary | ICD-10-CM | POA: Diagnosis not present

## 2023-10-18 DIAGNOSIS — R269 Unspecified abnormalities of gait and mobility: Secondary | ICD-10-CM | POA: Diagnosis not present

## 2023-10-18 DIAGNOSIS — M25512 Pain in left shoulder: Secondary | ICD-10-CM | POA: Diagnosis not present

## 2023-10-18 DIAGNOSIS — M25562 Pain in left knee: Secondary | ICD-10-CM | POA: Diagnosis not present

## 2023-10-18 DIAGNOSIS — M25812 Other specified joint disorders, left shoulder: Secondary | ICD-10-CM | POA: Diagnosis not present

## 2023-10-23 DIAGNOSIS — M25512 Pain in left shoulder: Secondary | ICD-10-CM | POA: Diagnosis not present

## 2023-10-23 DIAGNOSIS — M25812 Other specified joint disorders, left shoulder: Secondary | ICD-10-CM | POA: Diagnosis not present

## 2023-10-23 DIAGNOSIS — R269 Unspecified abnormalities of gait and mobility: Secondary | ICD-10-CM | POA: Diagnosis not present

## 2023-10-23 DIAGNOSIS — M25562 Pain in left knee: Secondary | ICD-10-CM | POA: Diagnosis not present

## 2023-10-25 DIAGNOSIS — M25812 Other specified joint disorders, left shoulder: Secondary | ICD-10-CM | POA: Diagnosis not present

## 2023-10-25 DIAGNOSIS — M25562 Pain in left knee: Secondary | ICD-10-CM | POA: Diagnosis not present

## 2023-10-25 DIAGNOSIS — R269 Unspecified abnormalities of gait and mobility: Secondary | ICD-10-CM | POA: Diagnosis not present

## 2023-10-25 DIAGNOSIS — M25512 Pain in left shoulder: Secondary | ICD-10-CM | POA: Diagnosis not present

## 2023-11-01 DIAGNOSIS — M25812 Other specified joint disorders, left shoulder: Secondary | ICD-10-CM | POA: Diagnosis not present

## 2023-11-01 DIAGNOSIS — M25512 Pain in left shoulder: Secondary | ICD-10-CM | POA: Diagnosis not present

## 2023-11-01 DIAGNOSIS — M25562 Pain in left knee: Secondary | ICD-10-CM | POA: Diagnosis not present

## 2023-11-01 DIAGNOSIS — R269 Unspecified abnormalities of gait and mobility: Secondary | ICD-10-CM | POA: Diagnosis not present

## 2023-11-05 ENCOUNTER — Other Ambulatory Visit: Payer: Self-pay | Admitting: Cardiovascular Disease

## 2023-11-05 NOTE — Telephone Encounter (Signed)
 Pt last saw Charles Connor, NP on 03/05/22, pt is overdue for follow-up.  Pt is overdue for 1 year follow-up with Dr Abel Hoe recall in Epic. Msdg sent to schedulers to contact pt for follow-up appt. Last labs 02/21/23 Creat 0.98, age 89, weight 77.6kg, based on specified criteria pt is on appropriate dosage of Eliquis  5mg  BID for afib/TIA.  Will await appt to refill rx.

## 2023-11-06 DIAGNOSIS — R269 Unspecified abnormalities of gait and mobility: Secondary | ICD-10-CM | POA: Diagnosis not present

## 2023-11-06 DIAGNOSIS — M25812 Other specified joint disorders, left shoulder: Secondary | ICD-10-CM | POA: Diagnosis not present

## 2023-11-06 DIAGNOSIS — M25512 Pain in left shoulder: Secondary | ICD-10-CM | POA: Diagnosis not present

## 2023-11-06 DIAGNOSIS — M25562 Pain in left knee: Secondary | ICD-10-CM | POA: Diagnosis not present

## 2023-11-07 ENCOUNTER — Other Ambulatory Visit: Payer: Self-pay

## 2023-11-07 NOTE — Telephone Encounter (Signed)
 Prescription refill request for Eliquis  received. Indication: Afib  Last office visit: 03/05/22 Valentina Gasman)  Scr: 0.98 (02/21/23)  Age: 89 Weight: 77.6kg  Office visit overdue. Message sent to schedulers.

## 2023-11-11 ENCOUNTER — Telehealth: Payer: Self-pay

## 2023-11-11 NOTE — Telephone Encounter (Signed)
 Called pt, no answer. Unable to leave message on voicemail. Called pt's wife, left message on voicemail.

## 2023-11-11 NOTE — Telephone Encounter (Signed)
 Lpm in regards to scheduling an appointment with his cardiologist, which would enable us  to refill his Eliquis .

## 2023-11-13 ENCOUNTER — Encounter: Payer: Self-pay | Admitting: Cardiovascular Disease

## 2023-11-13 ENCOUNTER — Telehealth: Payer: Self-pay | Admitting: Cardiovascular Disease

## 2023-11-13 DIAGNOSIS — M25812 Other specified joint disorders, left shoulder: Secondary | ICD-10-CM | POA: Diagnosis not present

## 2023-11-13 DIAGNOSIS — R3915 Urgency of urination: Secondary | ICD-10-CM | POA: Diagnosis not present

## 2023-11-13 DIAGNOSIS — R972 Elevated prostate specific antigen [PSA]: Secondary | ICD-10-CM | POA: Diagnosis not present

## 2023-11-13 DIAGNOSIS — N401 Enlarged prostate with lower urinary tract symptoms: Secondary | ICD-10-CM | POA: Diagnosis not present

## 2023-11-13 NOTE — Telephone Encounter (Signed)
 Patient contacted 3x to schedule overdue f/u, will send letter.

## 2023-11-13 NOTE — Telephone Encounter (Signed)
-----   Message from Nurse Marliss Simple B sent at 11/05/2023 11:07 AM EDT ----- Pt last saw Charles Connor, NP on 03/05/22, pt is overdue for follow-up.  Pt is overdue for 1 year follow-up with Dr Abel Hoe recall in Epic. Pt requesting rx refill, please contact pt to schedule follow-up appt.  Thanks

## 2023-11-15 DIAGNOSIS — M25812 Other specified joint disorders, left shoulder: Secondary | ICD-10-CM | POA: Diagnosis not present

## 2023-11-15 DIAGNOSIS — M25512 Pain in left shoulder: Secondary | ICD-10-CM | POA: Diagnosis not present

## 2023-11-15 DIAGNOSIS — M25562 Pain in left knee: Secondary | ICD-10-CM | POA: Diagnosis not present

## 2023-11-15 DIAGNOSIS — R269 Unspecified abnormalities of gait and mobility: Secondary | ICD-10-CM | POA: Diagnosis not present

## 2023-11-20 ENCOUNTER — Encounter: Payer: Self-pay | Admitting: Pharmacist

## 2023-11-20 ENCOUNTER — Telehealth: Payer: Self-pay | Admitting: Cardiovascular Disease

## 2023-11-20 DIAGNOSIS — R269 Unspecified abnormalities of gait and mobility: Secondary | ICD-10-CM | POA: Diagnosis not present

## 2023-11-20 DIAGNOSIS — M25812 Other specified joint disorders, left shoulder: Secondary | ICD-10-CM | POA: Diagnosis not present

## 2023-11-20 DIAGNOSIS — M25512 Pain in left shoulder: Secondary | ICD-10-CM | POA: Diagnosis not present

## 2023-11-20 DIAGNOSIS — M25562 Pain in left knee: Secondary | ICD-10-CM | POA: Diagnosis not present

## 2023-11-20 MED ORDER — ATORVASTATIN CALCIUM 80 MG PO TABS: 80.0000 mg | ORAL_TABLET | Freq: Every day | ORAL | 0 refills | Status: DC

## 2023-11-20 NOTE — Progress Notes (Signed)
 Pharmacy Quality Measure Review  This patient is appearing on a report for being at risk of failing the adherence measure for cholesterol (statin) medications this calendar year.   Medication: atorvastatin  80mg  Last fill date: 08/30/2023 for 30 day supply Atorvastatin  was last filled by his cardidologist - Dr Abel Hoe but patient is past due for a follow up visit. It looks like cardiology office has tried to contact him to make an appointment but they were not able to reach patient.   I tried to call patient but no answer. Spoke to his wife, [name]. She reports patient has been trying to reach cardiology office to make an appointment but has not been successful. She states if I can get him an appointment they will make it work if possible.   Called [facility] - patient appointment scheduled for Thursday, June 19th at 10:30am. Will be at the new [facility] location. Discussed barriers to adherence, which included irregular follow up with cardiology Collaborate with provider to facilitate refill needs.  Cecilie Coffee, PharmD Clinical Pharmacist [facility]  Population Health [phone]

## 2023-11-20 NOTE — Telephone Encounter (Signed)
 RX sent to requested Pharmacy

## 2023-11-20 NOTE — Telephone Encounter (Signed)
 1. Which medications need to be refilled? (please list name of each medication and dose if known)  atorvastatin  (LIPITOR ) 80 MG tablet   2. Which pharmacy/location (including street and city if local pharmacy) is medication to be sent to? [facility] - [place], [place] - [address]   3. Do they need a 30 day or 90 day supply?  90 day supply

## 2023-11-21 DIAGNOSIS — M25532 Pain in left wrist: Secondary | ICD-10-CM | POA: Diagnosis not present

## 2023-11-21 DIAGNOSIS — M79645 Pain in left finger(s): Secondary | ICD-10-CM | POA: Diagnosis not present

## 2023-11-22 ENCOUNTER — Other Ambulatory Visit: Payer: Self-pay | Admitting: Cardiovascular Disease

## 2023-11-22 DIAGNOSIS — M25562 Pain in left knee: Secondary | ICD-10-CM | POA: Diagnosis not present

## 2023-11-22 DIAGNOSIS — M25812 Other specified joint disorders, left shoulder: Secondary | ICD-10-CM | POA: Diagnosis not present

## 2023-11-22 DIAGNOSIS — R269 Unspecified abnormalities of gait and mobility: Secondary | ICD-10-CM | POA: Diagnosis not present

## 2023-11-22 DIAGNOSIS — M25512 Pain in left shoulder: Secondary | ICD-10-CM | POA: Diagnosis not present

## 2023-11-22 NOTE — Telephone Encounter (Signed)
 Prescription refill request for Eliquis  received. Indication:tia Last office visit:upcoming Scr:0.98  9/24 Age: 89 Weight:77.6  kg  Prescription refilled

## 2023-11-27 DIAGNOSIS — R269 Unspecified abnormalities of gait and mobility: Secondary | ICD-10-CM | POA: Diagnosis not present

## 2023-11-27 DIAGNOSIS — M25812 Other specified joint disorders, left shoulder: Secondary | ICD-10-CM | POA: Diagnosis not present

## 2023-11-27 DIAGNOSIS — M25562 Pain in left knee: Secondary | ICD-10-CM | POA: Diagnosis not present

## 2023-11-27 DIAGNOSIS — M25512 Pain in left shoulder: Secondary | ICD-10-CM | POA: Diagnosis not present

## 2023-11-28 ENCOUNTER — Encounter: Payer: Self-pay | Admitting: Nurse Practitioner

## 2023-11-28 ENCOUNTER — Ambulatory Visit: Attending: Nurse Practitioner | Admitting: Nurse Practitioner

## 2023-11-28 VITALS — BP 126/62 | HR 70 | Ht 72.0 in | Wt 172.0 lb

## 2023-11-28 DIAGNOSIS — I779 Disorder of arteries and arterioles, unspecified: Secondary | ICD-10-CM | POA: Diagnosis not present

## 2023-11-28 DIAGNOSIS — I351 Nonrheumatic aortic (valve) insufficiency: Secondary | ICD-10-CM

## 2023-11-28 DIAGNOSIS — I480 Paroxysmal atrial fibrillation: Secondary | ICD-10-CM

## 2023-11-28 DIAGNOSIS — E785 Hyperlipidemia, unspecified: Secondary | ICD-10-CM

## 2023-11-28 DIAGNOSIS — Z8673 Personal history of transient ischemic attack (TIA), and cerebral infarction without residual deficits: Secondary | ICD-10-CM

## 2023-11-28 DIAGNOSIS — I10 Essential (primary) hypertension: Secondary | ICD-10-CM | POA: Diagnosis not present

## 2023-11-28 DIAGNOSIS — I2510 Atherosclerotic heart disease of native coronary artery without angina pectoris: Secondary | ICD-10-CM

## 2023-11-28 NOTE — Progress Notes (Signed)
 Office Visit    Patient Name: Luis [name] Date of Encounter: 11/28/2023  Primary Care Provider:  Rodney Clamp, MD Primary Cardiologist:  Antoinette Batman, MD  Chief Complaint    89 year old male with a history of CAD s/p BMS-RCA, BMS-LCx in 2001, DES x 2 overlapping-RCA, PTCA-LCx in 2013, persistent atrial fibrillation, mild aortic insufficiency, RBBB, hypertension, hyperlipidemia, carotid artery stenosis, and TIA who presents for follow-up related to CAD and atrial fibrillation.  Past Medical History    Past Medical History:  Diagnosis Date   Anal abscess    Atrial fibrillation (HCC)    CAD (coronary artery disease)    a. BMS-RCA, BMS-Cx, PTCA of OM1 in 2001. b. overlapping DES in RCA and PTCA to small distal Cx 2013. c. Cath 2016 -> stable CAD, suspect dyspnea unrelated to coronary disease.   COLONIC POLYPS, HX OF    Dr Andriette Keeling   DJD (degenerative joint disease)    Essential hypertension    Hyperlipidemia    Myocardial infarction (HCC) 08/1999   Persistent atrial fibrillation (HCC)    a. dx 2016.   RBBB    TIA (transient ischemic attack)    Past Surgical History:  Procedure Laterality Date   CARDIAC CATHETERIZATION  ~ 2002   CARDIAC CATHETERIZATION N/A 03/02/2015   Procedure: Left Heart Cath and Coronary Angiography;  Surgeon: Arnoldo Lapping, MD;  Location: [facility] INVASIVE CV LAB;  Service: Cardiovascular;  Laterality: N/A;   CATARACT EXTRACTION W/ INTRAOCULAR LENS  IMPLANT, BILATERAL  ~ 2007   bilaterally   CORONARY ANGIOPLASTY  07/30/11   CORONARY ANGIOPLASTY WITH STENT PLACEMENT  2001; 07/30/11;   2+3   FEMUR IM NAIL Left 11/12/2022   Procedure: INTRAMEDULLARY (IM) NAIL HIP FRACTURE;  Surgeon: Hardy Lia, MD;  Location: [facility] OR;  Service: Orthopedics;  Laterality: Left;   INGUINAL HERNIA REPAIR  2005   left   KNEE ARTHROSCOPY Left 02/21/2023   Procedure: LEFT KNEE ARTHROSCOPY;  Surgeon: Hardy Lia, MD;  Location: [facility] OR;  Service: Orthopedics;   Laterality: Left;   LEFT HEART CATHETERIZATION WITH CORONARY ANGIOGRAM N/A 07/30/2011   Procedure: LEFT HEART CATHETERIZATION WITH CORONARY ANGIOGRAM;  Surgeon: Odie Benne, MD;  Location: [facility] CATH LAB;  Service: Cardiovascular;  Laterality: N/A;   PERCUTANEOUS CORONARY STENT INTERVENTION (PCI-S)  07/30/2011   Procedure: PERCUTANEOUS CORONARY STENT INTERVENTION (PCI-S);  Surgeon: Odie Benne, MD;  Location: [facility] CATH LAB;  Service: Cardiovascular;;   RETINAL DETACHMENT SURGERY  05/2008; 06/2008; 07/2008   left; all 3 surgeries have failed   SHOULDER INJECTION Left 02/21/2023   Procedure: LEFT SHOULDER SUBACROMIAL INJECTION WITH STEROID;  Surgeon: Hardy Lia, MD;  Location: [facility] OR;  Service: Orthopedics;  Laterality: Left;    Allergies  No Known Allergies   Labs/Other Studies Reviewed    The following studies were reviewed today:  Cardiac Studies & Procedures   ______________________________________________________________________________________________ CARDIAC CATHETERIZATION  CARDIAC CATHETERIZATION 03/02/2015  Conclusion  Prox RCA lesion, 20% stenosed. A drug-eluting stent was placed. The lesion was previously treated with a drug-eluting stent .  Dist RCA lesion, 10% stenosed. A drug-eluting stent was placed. The lesion was previously treated with a drug-eluting stent .  Mid RCA lesion, 30% stenosed.  Mid Cx lesion, 50% stenosed. The lesion was previously treated with a stent (unknown type) .  3rd Mrg lesion, 90% stenosed.  2nd Mrg lesion, 70% stenosed.  Prox LAD lesion, 30% stenosed.  Mid LAD lesion, 30% stenosed.  The left ventricular systolic function is normal.  1. Stable CAD with continued patency of the stented segments in the RCA, mild stenosis of the LCx stent, Mild diffuse nonobstructive stenosis of the LAD, and severe distal stenosis of small OM subranches 2. Normal LV function  Recommendation: continued medical therapy for stable CAD.  Suspect dyspnea is unrelated to coronary obstruction.  Findings Coronary Findings Diagnostic  Dominance: Right  Left Main The vessel is angiographically normal.  Left Anterior Descending  Left Circumflex The lesion was previously treated with a stent (unknown type) . Distal stent edge only appreciated in the LAO Caudal view, otherwise appears widely patent  Second Obtuse Marginal Branch The vessel is small in size.  Third Obtuse Marginal Branch The vessel is small in size. Small vessel, previously unable to cross with a balloon  Right Coronary Artery The lesion was previously treated with a drug-eluting stent . Minimal diffuse ISR  The lesion was previously treated with a drug-eluting stent . Minimal diffuse ISR  Intervention  No interventions have been documented.     ECHOCARDIOGRAM  ECHOCARDIOGRAM COMPLETE 11/12/2022  Narrative ECHOCARDIOGRAM REPORT    Patient Name:   Luis [name] Date of Exam: 11/12/2022 Medical Rec #:  [id number]        Height:       72.0 in Accession #:    [id number]       Weight:       183.0 lb Date of Birth:  07/24/1934        BSA:          2.052 m Patient Age:    87 years         BP:           128/73 mmHg Patient Gender: M                HR:           99 bpm. Exam Location:  Inpatient  Procedure: 2D Echo, Color Doppler and Cardiac Doppler  Indications:    CAD Native Vessel  History:        Patient has prior history of Echocardiogram examinations, most recent 11/30/2014. CAD and Previous Myocardial Infarction, TIA, Arrythmias:Atrial Fibrillation and RBBB; Risk Factors:Hypertension and Dyslipidemia.  Sonographer:    Jasmine Mesi Referring Phys: [id number] PING T ZHANG   Sonographer Comments: Suboptimal parasternal window. Image acquisition challenging due to respiratory motion and Image acquisition challenging due to patient body habitus. IMPRESSIONS   1. Left ventricular ejection fraction, by estimation, is 60 to 65%. The left  ventricle has normal function. The left ventricle has no regional wall motion abnormalities. There is mild concentric left ventricular hypertrophy. Left ventricular diastolic function could not be evaluated. 2. Right ventricular systolic function is normal. The right ventricular size is normal. 3. Left atrial size was mild to moderately dilated. 4. The mitral valve is normal in structure. Trivial mitral valve regurgitation. No evidence of mitral stenosis. 5. The aortic valve is tricuspid. There is moderate calcification of the aortic valve. Aortic valve regurgitation is mild. Aortic valve sclerosis/calcification is present, without any evidence of aortic stenosis. Aortic regurgitation PHT measures 691 msec. 6. Aortic dilatation noted. There is borderline dilatation of the ascending aorta, measuring 38 mm.  FINDINGS Left Ventricle: Left ventricular ejection fraction, by estimation, is 60 to 65%. The left ventricle has normal function. The left ventricle has no regional wall motion abnormalities. The left ventricular internal cavity size was normal in size. There is mild concentric left ventricular hypertrophy. Left ventricular diastolic function could  not be evaluated due to atrial fibrillation. Left ventricular diastolic function could not be evaluated.  Right Ventricle: The right ventricular size is normal. No increase in right ventricular wall thickness. Right ventricular systolic function is normal.  Left Atrium: Left atrial size was mild to moderately dilated.  Right Atrium: Right atrial size was normal in size.  Pericardium: There is no evidence of pericardial effusion.  Mitral Valve: The mitral valve is normal in structure. Trivial mitral valve regurgitation. No evidence of mitral valve stenosis.  Tricuspid Valve: The tricuspid valve is normal in structure. Tricuspid valve regurgitation is trivial. No evidence of tricuspid stenosis.  Aortic Valve: The aortic valve is tricuspid. There  is moderate calcification of the aortic valve. Aortic valve regurgitation is mild. Aortic regurgitation PHT measures 691 msec. Aortic valve sclerosis/calcification is present, without any evidence of aortic stenosis.  Pulmonic Valve: The pulmonic valve was normal in structure. Pulmonic valve regurgitation is trivial. No evidence of pulmonic stenosis.  Aorta: Aortic dilatation noted. There is borderline dilatation of the ascending aorta, measuring 38 mm.  Venous: The inferior vena cava was not well visualized.  IAS/Shunts: No atrial level shunt detected by color flow Doppler.   LEFT VENTRICLE PLAX 2D LVIDd:         3.70 cm     Diastology LVIDs:         2.80 cm     LV e' medial:    8.05 cm/s LV PW:         1.20 cm     LV E/e' medial:  11.8 LV IVS:        1.40 cm     LV e' lateral:   13.30 cm/s LVOT diam:     2.10 cm     LV E/e' lateral: 7.1 LV SV:         73 LV SV Index:   35 LVOT Area:     3.46 cm  LV Volumes (MOD) LV vol d, MOD A2C: 65.3 ml LV vol d, MOD A4C: 76.3 ml LV vol s, MOD A2C: 29.8 ml LV vol s, MOD A4C: 27.7 ml LV SV MOD A2C:     35.5 ml LV SV MOD A4C:     76.3 ml LV SV MOD BP:      41.7 ml  RIGHT VENTRICLE RV Basal diam:  3.30 cm RV S prime:     12.90 cm/s TAPSE (M-mode): 1.5 cm  LEFT ATRIUM             Index        RIGHT ATRIUM           Index LA diam:        5.00 cm 2.44 cm/m   RA Area:     17.80 cm LA Vol (A2C):   51.2 ml 24.96 ml/m  RA Volume:   42.50 ml  20.72 ml/m LA Vol (A4C):   56.4 ml 27.49 ml/m LA Biplane Vol: 56.0 ml 27.30 ml/m AORTIC VALVE LVOT Vmax:   104.00 cm/s LVOT Vmean:  75.100 cm/s LVOT VTI:    0.210 m AI PHT:      691 msec  AORTA Ao Root diam: 3.70 cm Ao Asc diam:  3.80 cm  MITRAL VALVE MV Area (PHT): 2.73 cm    SHUNTS MV Decel Time: 278 msec    Systemic VTI:  0.21 m MR Peak grad: 20.1 mmHg    Systemic Diam: 2.10 cm MR Vmax:      224.00 cm/s MV  E velocity: 95.00 cm/s  Jules Oar MD Electronically signed by Jules Oar MD Signature Date/Time: 11/12/2022/2:40:13 PM    Final          ______________________________________________________________________________________________     Recent Labs: 02/21/2023: BUN 11; Creatinine, Ser 0.98; Hemoglobin 14.5; Platelets 243; Potassium 4.1; Sodium 136  Recent Lipid Panel    Component Value Date/Time   CHOL 153 05/25/2022 0850   CHOL 116 08/03/2020 0839   TRIG 83.0 05/25/2022 0850   TRIG 80 05/07/2006 1036   HDL 64.40 05/25/2022 0850   HDL 49 08/03/2020 0839   CHOLHDL 2 05/25/2022 0850   VLDL 16.6 05/25/2022 0850   LDLCALC 72 05/25/2022 0850   LDLCALC 55 08/03/2020 0839    History of Present Illness    89 year old male with the above past medical history including CAD s/p BMS-RCA, BMS-LCx in 2001, DES x 2 overlapping-RCA, PTCA-LCx in 2013, persistent atrial fibrillation, mild aortic insufficiency, RBBB, hypertension, hyperlipidemia, carotid artery stenosis, and TIA .  He has a history of CAD as above.  Stress test in 2013 revealed mild inferior wall ischemia.  He underwent repeat cardiac catheterization in 2013 which revealed in-stent restenosis-RCA, s/p DES x 2.  He was hospitalized in 2016 in the setting of TIA, he was found to be in atrial fibrillation.  He was started on Eliquis .  Most recent cardiac catheterization in 2016 revealed stable CAD.  Echocardiogram the time showed normal LV function, no significant valvular abnormalities.  He was last seen in the office on 03/05/2022 and was stable from a cardiac standpoint.  Carotid Dopplers in 03/2022 revealed 1 to 39% B ICA stenosis.  He was hospitalized in June 2024 in the setting of left intertrochanteric femoral fracture due to fall, s/p IM nailing.  Echocardiogram in 11/2022 showed EF 60 to 65%, normal LV function, no RWMA, mild concentric LVH, normal RV systolic function, mild to moderately dilated left atrium, mild aortic valve regurgitation, borderline dilation of ascending aorta measuring 38  mm.  He presents today for follow-up.  Since his last visit he has been stable from a cardiac standpoint.  He has stable mild dyspnea with exertion, unchanged from prior visits.  He denies chest pain or other symptoms concerning for angina. Overall, he reports feeling well.   Home Medications    Current Outpatient Medications  Medication Sig Dispense Refill   acetaminophen  (TYLENOL ) 500 MG tablet Take 1-2 tablets (500-1,000 mg total) by mouth every 8 (eight) hours as needed for mild pain or moderate pain. 100 tablet 0   apixaban  (ELIQUIS ) 5 MG TABS tablet Take 1 tablet (5 mg total) by mouth 2 (two) times daily. 180 tablet 2   atorvastatin  (LIPITOR ) 80 MG tablet Take 1 tablet (80 mg total) by mouth daily. 15 tablet 0   buPROPion  (WELLBUTRIN  XL) 150 MG 24 hr tablet Take 150 mg by mouth daily.     feeding supplement (ENSURE ENLIVE / ENSURE PLUS) LIQD Take 237 mLs by mouth 3 (three) times daily between meals. 237 mL 12   FLUoxetine  (PROZAC ) 20 MG capsule Take 20 mg by mouth daily.     Glucosamine HCl (GLUCOSAMINE PO) Take 2 tablets by mouth daily.     nitroGLYCERIN  (NITROSTAT ) 0.4 MG SL tablet DISSOLVE 1 TABLET UNDER TONGUE AS NEEEDED FOR CHEST PAIN, MAY REPEAT IN 5 MINUTES FOR 2 DOSES 25 tablet 6   pantoprazole  (PROTONIX ) 40 MG tablet Take 1 tablet (40 mg total) by mouth daily. 30 tablet 0   vitamin E 1000  UNIT capsule Take 1,000 Units by mouth daily.     No current facility-administered medications for this visit.     Review of Systems    He denies chest pain, palpitations, pnd, orthopnea, n, v, dizziness, syncope, edema, weight gain, or early satiety. All other systems reviewed and are otherwise negative except as noted above.   Physical Exam    VS:  BP 126/62 (BP Location: Left Arm, Patient Position: Sitting, Cuff Size: Normal)   Pulse 70   Ht 6' (1.829 m)   Wt 172 lb (78 kg)   SpO2 96%   BMI 23.33 kg/m   GEN: Well nourished, well developed, in no acute distress. HEENT:  normal. Neck: Supple, no JVD, carotid bruits, or masses. Cardiac: IRIR, no murmurs, rubs, or gallops. No clubbing, cyanosis, edema.  Radials/DP/PT 2+ and equal bilaterally.  Respiratory:  Respirations regular and unlabored, clear to auscultation bilaterally. GI: Soft, nontender, nondistended, BS + x 4. MS: no deformity or atrophy. Skin: warm and dry, no rash. Neuro:  Strength and sensation are intact. Psych: Normal affect.  Accessory Clinical Findings    ECG personally reviewed by me today - EKG Interpretation Date/Time:  Thursday November 28 2023 10:20:19 EDT Ventricular Rate:  70 PR Interval:    QRS Duration:  126 QT Interval:  430 QTC Calculation: 464 R Axis:   -61  Text Interpretation: Atrial fibrillation Left axis deviation Right bundle branch block When compared with ECG of 11-Nov-2022 19:15, PREVIOUS ECG IS PRESENT Confirmed by Marlana Silvan ([id number]) on 11/28/2023 10:23:12 AM  - no acute changes.   Lab Results  Component Value Date   WBC 6.5 02/21/2023   HGB 14.5 02/21/2023   HCT 43.3 02/21/2023   MCV 92.3 02/21/2023   PLT 243 02/21/2023   Lab Results  Component Value Date   CREATININE 0.98 02/21/2023   BUN 11 02/21/2023   NA 136 02/21/2023   K 4.1 02/21/2023   CL 105 02/21/2023   CO2 20 (L) 02/21/2023   Lab Results  Component Value Date   ALT 20 05/25/2022   AST 20 05/25/2022   ALKPHOS 77 05/25/2022   BILITOT 0.7 05/25/2022   Lab Results  Component Value Date   CHOL 153 05/25/2022   HDL 64.40 05/25/2022   LDLCALC 72 05/25/2022   TRIG 83.0 05/25/2022   CHOLHDL 2 05/25/2022    Lab Results  Component Value Date   HGBA1C 5.9 (H) 11/13/2022    Assessment & Plan    1. CAD: S/p BMS-RCA, BMS-LCx in 2001, DES x 2 overlapping-RCA, PTCA-LCx in 2013.  He has stable mild dyspnea on exertion, unchanged from prior visits.  Otherwise, stable with no anginal symptoms. No indication for ischemic evaluation.  No ASA in the setting of chronic DOAC therapy.  Continue  Lipitor .  2. Persistent atrial fibrillation: Rate controlled. Denies any palpitations, denies bleeding.  Will update CBC, CMET.  Continue Eliquis .  3. Mild aortic insufficiency: Echocardiogram in 11/2022 showed EF 60 to 65%, normal LV function, no RWMA, mild concentric LVH, normal RV systolic function, mild to moderately dilated left atrium, mild aortic valve regurgitation, borderline dilation of ascending aorta measuring 38 mm.  He reports stable mild dyspnea on exertion, unchanged from prior visits. Euvolemic and well compensated on exam. Consider repeat echocardiogram as clinically indicated.  4. Hypertension: BP well controlled. Continue current antihypertensive regimen.    5. Hyperlipidemia: LDL was 72 in 05/2022.  Will update fasting lipids. Continue Lipitor .  6. Carotid artery stenosis/history of TIA:  Carotid ultrasound in 2019 revealed 1-3 9% B ICA stenosis.  Asymptomatic.  Consider repeat carotid ultrasound as clinically indicated.  Continue Eliquis , Lipitor .   7. Disposition: Follow-up in 6 months, sooner if needed.      Jude Norton, NP 11/28/2023, 12:46 PM

## 2023-11-28 NOTE — Patient Instructions (Addendum)
 Medication Instructions:  No medication changes were made during today's visit.  *If you need a refill on your cardiac medications before your next appointment, please call your pharmacy*   Lab Work: Return for fasting labs................Aaron Aas LIPIDS, CBC, CMET If you have labs (blood work) drawn today and your tests are completely normal, you will receive your results only by: MyChart Message (if you have MyChart) OR A paper copy in the mail If you have any lab test that is abnormal or we need to change your treatment, we will call you to review the results.   Testing/Procedures: No procedures were ordered during today's visit.    Follow-Up: At [facility], you and your health needs are our priority.  As part of our continuing mission to provide you with exceptional heart care, we have created designated Provider Care Teams.  These Care Teams include your primary Cardiologist (physician) and Advanced Practice Providers (APPs -  Physician Assistants and Nurse Practitioners) who all work together to provide you with the care you need, when you need it.  We recommend signing up for the patient portal called MyChart.  Sign up information is provided on this After Visit Summary.  MyChart is used to connect with patients for Virtual Visits (Telemedicine).  Patients are able to view lab/test results, encounter notes, upcoming appointments, etc.  Non-urgent messages can be sent to your provider as well.   To learn more about what you can do with MyChart, go to [email or web address].    Your next appointment:   6 month(s)  Provider:   Antoinette Batman, MD    Other Instructions Thank you for choosing [facility]!     A letter will be mailed to you as a reminder to call the office for your next follow up appointment.

## 2023-11-29 DIAGNOSIS — R269 Unspecified abnormalities of gait and mobility: Secondary | ICD-10-CM | POA: Diagnosis not present

## 2023-11-29 DIAGNOSIS — M25812 Other specified joint disorders, left shoulder: Secondary | ICD-10-CM | POA: Diagnosis not present

## 2023-11-29 DIAGNOSIS — M25512 Pain in left shoulder: Secondary | ICD-10-CM | POA: Diagnosis not present

## 2023-11-29 DIAGNOSIS — M25562 Pain in left knee: Secondary | ICD-10-CM | POA: Diagnosis not present

## 2023-12-04 ENCOUNTER — Other Ambulatory Visit: Payer: Self-pay | Admitting: Pharmacist

## 2023-12-04 ENCOUNTER — Encounter: Payer: Self-pay | Admitting: Pharmacist

## 2023-12-04 DIAGNOSIS — M25812 Other specified joint disorders, left shoulder: Secondary | ICD-10-CM | POA: Diagnosis not present

## 2023-12-04 DIAGNOSIS — M25562 Pain in left knee: Secondary | ICD-10-CM | POA: Diagnosis not present

## 2023-12-04 DIAGNOSIS — M25512 Pain in left shoulder: Secondary | ICD-10-CM | POA: Diagnosis not present

## 2023-12-04 DIAGNOSIS — R269 Unspecified abnormalities of gait and mobility: Secondary | ICD-10-CM | POA: Diagnosis not present

## 2023-12-04 NOTE — Progress Notes (Signed)
 Pharmacy Quality Measure Review  This patient is appearing on a report for being at risk of failing the adherence measure for cholesterol (statin) medications this calendar year.   Medication: atorvastatin  80mg  Last fill date: 08/30/2023 for 30 day supply per adherence report but looks like Dr Santa office printed a prescription for atorvastatin  for 15 DS on 11/20/2023 to get patient thru until his appointment with cardio office. I called [facility] but patient has not filled atorvastatin  recently.   Will reach out to cardio office for new Rx.     Madelin Ray, PharmD Clinical Pharmacist [facility] Primary Care  Population Health [phone]

## 2023-12-06 ENCOUNTER — Other Ambulatory Visit: Payer: Self-pay | Admitting: Cardiovascular Disease

## 2023-12-06 DIAGNOSIS — M25512 Pain in left shoulder: Secondary | ICD-10-CM | POA: Diagnosis not present

## 2023-12-06 DIAGNOSIS — M25562 Pain in left knee: Secondary | ICD-10-CM | POA: Diagnosis not present

## 2023-12-06 DIAGNOSIS — R269 Unspecified abnormalities of gait and mobility: Secondary | ICD-10-CM | POA: Diagnosis not present

## 2023-12-06 DIAGNOSIS — M25812 Other specified joint disorders, left shoulder: Secondary | ICD-10-CM | POA: Diagnosis not present

## 2023-12-06 MED ORDER — ATORVASTATIN CALCIUM 80 MG PO TABS
80.0000 mg | ORAL_TABLET | Freq: Every day | ORAL | 3 refills | Status: AC
Start: 2023-12-06 — End: ?

## 2023-12-09 DIAGNOSIS — M25812 Other specified joint disorders, left shoulder: Secondary | ICD-10-CM | POA: Diagnosis not present

## 2023-12-09 DIAGNOSIS — M25562 Pain in left knee: Secondary | ICD-10-CM | POA: Diagnosis not present

## 2023-12-09 DIAGNOSIS — R269 Unspecified abnormalities of gait and mobility: Secondary | ICD-10-CM | POA: Diagnosis not present

## 2023-12-09 DIAGNOSIS — M25512 Pain in left shoulder: Secondary | ICD-10-CM | POA: Diagnosis not present

## 2023-12-11 DIAGNOSIS — M25812 Other specified joint disorders, left shoulder: Secondary | ICD-10-CM | POA: Diagnosis not present

## 2023-12-11 DIAGNOSIS — M25512 Pain in left shoulder: Secondary | ICD-10-CM | POA: Diagnosis not present

## 2023-12-11 DIAGNOSIS — R269 Unspecified abnormalities of gait and mobility: Secondary | ICD-10-CM | POA: Diagnosis not present

## 2023-12-11 DIAGNOSIS — M25562 Pain in left knee: Secondary | ICD-10-CM | POA: Diagnosis not present

## 2023-12-19 DIAGNOSIS — M79645 Pain in left finger(s): Secondary | ICD-10-CM | POA: Diagnosis not present

## 2023-12-19 DIAGNOSIS — M25532 Pain in left wrist: Secondary | ICD-10-CM | POA: Diagnosis not present

## 2023-12-20 DIAGNOSIS — M25512 Pain in left shoulder: Secondary | ICD-10-CM | POA: Diagnosis not present

## 2023-12-20 DIAGNOSIS — R269 Unspecified abnormalities of gait and mobility: Secondary | ICD-10-CM | POA: Diagnosis not present

## 2023-12-20 DIAGNOSIS — M25812 Other specified joint disorders, left shoulder: Secondary | ICD-10-CM | POA: Diagnosis not present

## 2023-12-20 DIAGNOSIS — M25562 Pain in left knee: Secondary | ICD-10-CM | POA: Diagnosis not present

## 2023-12-25 DIAGNOSIS — M25812 Other specified joint disorders, left shoulder: Secondary | ICD-10-CM | POA: Diagnosis not present

## 2023-12-25 DIAGNOSIS — R269 Unspecified abnormalities of gait and mobility: Secondary | ICD-10-CM | POA: Diagnosis not present

## 2023-12-25 DIAGNOSIS — M25512 Pain in left shoulder: Secondary | ICD-10-CM | POA: Diagnosis not present

## 2023-12-25 DIAGNOSIS — M25562 Pain in left knee: Secondary | ICD-10-CM | POA: Diagnosis not present

## 2023-12-27 DIAGNOSIS — R269 Unspecified abnormalities of gait and mobility: Secondary | ICD-10-CM | POA: Diagnosis not present

## 2023-12-27 DIAGNOSIS — M25812 Other specified joint disorders, left shoulder: Secondary | ICD-10-CM | POA: Diagnosis not present

## 2023-12-27 DIAGNOSIS — M25562 Pain in left knee: Secondary | ICD-10-CM | POA: Diagnosis not present

## 2023-12-27 DIAGNOSIS — M25512 Pain in left shoulder: Secondary | ICD-10-CM | POA: Diagnosis not present

## 2024-01-01 DIAGNOSIS — M25562 Pain in left knee: Secondary | ICD-10-CM | POA: Diagnosis not present

## 2024-01-01 DIAGNOSIS — R269 Unspecified abnormalities of gait and mobility: Secondary | ICD-10-CM | POA: Diagnosis not present

## 2024-01-01 DIAGNOSIS — M25512 Pain in left shoulder: Secondary | ICD-10-CM | POA: Diagnosis not present

## 2024-01-01 DIAGNOSIS — M25812 Other specified joint disorders, left shoulder: Secondary | ICD-10-CM | POA: Diagnosis not present

## 2024-01-03 DIAGNOSIS — M25812 Other specified joint disorders, left shoulder: Secondary | ICD-10-CM | POA: Diagnosis not present

## 2024-01-03 DIAGNOSIS — M25562 Pain in left knee: Secondary | ICD-10-CM | POA: Diagnosis not present

## 2024-01-03 DIAGNOSIS — M25512 Pain in left shoulder: Secondary | ICD-10-CM | POA: Diagnosis not present

## 2024-01-03 DIAGNOSIS — R269 Unspecified abnormalities of gait and mobility: Secondary | ICD-10-CM | POA: Diagnosis not present

## 2024-01-08 DIAGNOSIS — M25562 Pain in left knee: Secondary | ICD-10-CM | POA: Diagnosis not present

## 2024-01-08 DIAGNOSIS — R269 Unspecified abnormalities of gait and mobility: Secondary | ICD-10-CM | POA: Diagnosis not present

## 2024-01-08 DIAGNOSIS — M25812 Other specified joint disorders, left shoulder: Secondary | ICD-10-CM | POA: Diagnosis not present

## 2024-01-08 DIAGNOSIS — M25512 Pain in left shoulder: Secondary | ICD-10-CM | POA: Diagnosis not present

## 2024-01-10 DIAGNOSIS — M25562 Pain in left knee: Secondary | ICD-10-CM | POA: Diagnosis not present

## 2024-01-10 DIAGNOSIS — M25812 Other specified joint disorders, left shoulder: Secondary | ICD-10-CM | POA: Diagnosis not present

## 2024-01-10 DIAGNOSIS — M25512 Pain in left shoulder: Secondary | ICD-10-CM | POA: Diagnosis not present

## 2024-01-10 DIAGNOSIS — R269 Unspecified abnormalities of gait and mobility: Secondary | ICD-10-CM | POA: Diagnosis not present

## 2024-01-15 DIAGNOSIS — M25512 Pain in left shoulder: Secondary | ICD-10-CM | POA: Diagnosis not present

## 2024-01-15 DIAGNOSIS — R269 Unspecified abnormalities of gait and mobility: Secondary | ICD-10-CM | POA: Diagnosis not present

## 2024-01-15 DIAGNOSIS — M25562 Pain in left knee: Secondary | ICD-10-CM | POA: Diagnosis not present

## 2024-01-15 DIAGNOSIS — M25812 Other specified joint disorders, left shoulder: Secondary | ICD-10-CM | POA: Diagnosis not present

## 2024-01-16 NOTE — Telephone Encounter (Signed)
 Contacted pt and scheduled.

## 2024-01-16 NOTE — Telephone Encounter (Signed)
 Please schedule an office visit with Dr Jimmey Ralph

## 2024-01-22 ENCOUNTER — Ambulatory Visit: Admitting: Family Medicine

## 2024-01-22 DIAGNOSIS — M25812 Other specified joint disorders, left shoulder: Secondary | ICD-10-CM | POA: Diagnosis not present

## 2024-01-22 DIAGNOSIS — M25512 Pain in left shoulder: Secondary | ICD-10-CM | POA: Diagnosis not present

## 2024-01-22 DIAGNOSIS — R269 Unspecified abnormalities of gait and mobility: Secondary | ICD-10-CM | POA: Diagnosis not present

## 2024-01-22 DIAGNOSIS — M25562 Pain in left knee: Secondary | ICD-10-CM | POA: Diagnosis not present

## 2024-01-29 ENCOUNTER — Ambulatory Visit (INDEPENDENT_AMBULATORY_CARE_PROVIDER_SITE_OTHER): Admitting: Family Medicine

## 2024-01-29 VITALS — BP 126/86 | HR 77 | Temp 97.0°F | Wt 180.2 lb

## 2024-01-29 DIAGNOSIS — I10 Essential (primary) hypertension: Secondary | ICD-10-CM | POA: Diagnosis not present

## 2024-01-29 DIAGNOSIS — R6889 Other general symptoms and signs: Secondary | ICD-10-CM | POA: Diagnosis not present

## 2024-01-29 DIAGNOSIS — I4819 Other persistent atrial fibrillation: Secondary | ICD-10-CM

## 2024-01-29 DIAGNOSIS — M25512 Pain in left shoulder: Secondary | ICD-10-CM | POA: Diagnosis not present

## 2024-01-29 DIAGNOSIS — R5383 Other fatigue: Secondary | ICD-10-CM

## 2024-01-29 DIAGNOSIS — M25812 Other specified joint disorders, left shoulder: Secondary | ICD-10-CM | POA: Diagnosis not present

## 2024-01-29 DIAGNOSIS — M25562 Pain in left knee: Secondary | ICD-10-CM | POA: Diagnosis not present

## 2024-01-29 DIAGNOSIS — R269 Unspecified abnormalities of gait and mobility: Secondary | ICD-10-CM | POA: Diagnosis not present

## 2024-01-29 DIAGNOSIS — E7849 Other hyperlipidemia: Secondary | ICD-10-CM

## 2024-01-29 DIAGNOSIS — F339 Major depressive disorder, recurrent, unspecified: Secondary | ICD-10-CM

## 2024-01-29 LAB — COMPREHENSIVE METABOLIC PANEL WITH GFR
ALT: 22 U/L (ref 0–53)
AST: 17 U/L (ref 0–37)
Albumin: 3.9 g/dL (ref 3.5–5.2)
Alkaline Phosphatase: 74 U/L (ref 39–117)
BUN: 11 mg/dL (ref 6–23)
CO2: 28 meq/L (ref 19–32)
Calcium: 9 mg/dL (ref 8.4–10.5)
Chloride: 104 meq/L (ref 96–112)
Creatinine, Ser: 1.01 mg/dL (ref 0.40–1.50)
GFR: 67.12 mL/min (ref >60.00)
Glucose, Bld: 122 mg/dL — ABNORMAL HIGH (ref 70–99)
Potassium: 4.3 meq/L (ref 3.5–5.1)
Sodium: 140 meq/L (ref 135–145)
Total Bilirubin: 0.6 mg/dL (ref 0.2–1.2)
Total Protein: 6.1 g/dL (ref 6.0–8.3)

## 2024-01-29 LAB — CBC
HCT: 42.8 % (ref 39.0–52.0)
Hemoglobin: 14.4 g/dL (ref 13.0–17.0)
MCHC: 33.7 g/dL (ref 30.0–36.0)
MCV: 94.3 fl (ref 78.0–100.0)
Platelets: 226 K/uL (ref 150.0–400.0)
RBC: 4.54 Mil/uL (ref 4.22–5.81)
RDW: 14.2 % (ref 11.5–15.5)
WBC: 4.6 K/uL (ref 4.0–10.5)

## 2024-01-29 LAB — LIPID PANEL
Cholesterol: 163 mg/dL (ref 0–200)
HDL: 64.7 mg/dL (ref >39.00)
LDL Cholesterol: 85 mg/dL (ref 0–99)
NonHDL: 98.27
Total CHOL/HDL Ratio: 3
Triglycerides: 67 mg/dL (ref 0.0–149.0)
VLDL: 13.4 mg/dL (ref 0.0–40.0)

## 2024-01-29 LAB — TSH: TSH: 1.65 u[IU]/mL (ref 0.35–5.50)

## 2024-01-29 LAB — HEMOGLOBIN A1C: Hgb A1c MFr Bld: 5.9 % (ref 4.6–6.5)

## 2024-01-29 LAB — FOLATE: Folate: 12.7 ng/mL (ref >5.9)

## 2024-01-29 LAB — VITAMIN B12: Vitamin B-12: 485 pg/mL (ref 211–911)

## 2024-01-29 LAB — TESTOSTERONE: Testosterone: 315.17 ng/dL (ref 300.00–890.00)

## 2024-01-29 NOTE — Patient Instructions (Signed)
 It was very nice to see you today!  VISIT SUMMARY: During your visit, we discussed your concerns about memory loss and potential dementia, symptoms of depression, ongoing management of prostate cancer, joint pain, and abdominal distension.  YOUR PLAN: COGNITIVE IMPAIRMENT: You have been experiencing increased forgetfulness, which raises concerns about potential dementia. -We will refer you to a neurologist for a neurocognitive evaluation. -We will also order blood work to check your thyroid  function and B12 levels.  DEPRESSION: You are experiencing symptoms of depression despite taking Wellbutrin  and Prozac . -We will check your testosterone  levels as part of your blood work to see if low testosterone  is contributing to your symptoms.  PROSTATE CANCER: Your prostate cancer is under active management with recent beneficial medication changes by your urologist. -Continue following up with your urologist for ongoing management.   ABDOMINAL DISTENSION: You experience abdominal distension, which is relieved by simethicone. -Continue taking simethicone (Gas-X) as needed. -Consider taking probiotics to help with your symptoms.  Return if symptoms worsen or fail to improve.   Take care, Dr Kennyth  PLEASE NOTE:  If you had any lab tests, please let us  know if you have not heard back within a few days. You may see your results on mychart before we have a chance to review them but we will give you a call once they are reviewed by us .   If we ordered any referrals today, please let us  know if you have not heard from their office within the next week.   If you had any urgent prescriptions sent in today, please check with the pharmacy within an hour of our visit to make sure the prescription was transmitted appropriately.   Please try these tips to maintain a healthy lifestyle:  Eat at least 3 REAL meals and 1-2 snacks per day.  Aim for no more than 5 hours between eating.  If you eat breakfast,  please do so within one hour of getting up.   Each meal should contain half fruits/vegetables, one quarter protein, and one quarter carbs (no bigger than a computer mouse)  Cut down on sweet beverages. This includes juice, soda, and sweet tea.   Drink at least 1 glass of water  with each meal and aim for at least 8 glasses per day  Exercise at least 150 minutes every week.

## 2024-01-29 NOTE — Assessment & Plan Note (Signed)
Continue management per cardiology. 

## 2024-01-29 NOTE — Progress Notes (Signed)
   Luis [name] is a 89 y.o. male who presents today for an office visit.  Assessment/Plan:  New/Acute Problems: Other Fatigue  Likely multifactorial related to prostate cancer, depression that is being treated with Wellbutrin  and Prozac , and age.  We will check labs today.  Chronic Problems Addressed Today: Forgetfulness Patient has noticed more issues with short-term memory and recently.  He would like to be evaluated for dementia.  Will place referral to neurology today.  We will check labs including CBC, c-Met, TSH, folate, thiamine, and B12.  May consider MRI if he cannot see neurology in a reasonable timeframe.  Major depression, recurrent, chronic (HCC) Patient has had decreased motivation and more anhedonia lately.  He is currently on Prozac  20 mg daily and Wellbutrin  150 mg daily that is managed by different provider.  He will continue on this for now though would consider adjusting the dose of either of these if continues to have significant issues with anhedonia.  HLD (hyperlipidemia) Check lipids.  Follows with cardiology.  On Zetia  10 mg daily and Lipitor  80 mg daily.  HTN (hypertension) Blood pressure at goal today without meds.  Atrial fibrillation (HCC) Continue management per cardiology.     Subjective:  HPI:  See assessment / plan for status of chronic conditions.   Discussed the use of AI scribe software for clinical note transcription with the patient, who gave verbal consent to proceed.  History of Present Illness Luis [name] is an 89 year old male who presents with concerns about memory loss and potential dementia.  He has been experiencing increasing forgetfulness, including difficulty remembering the day of the week and what he had for lunch the previous day. He is concerned about potential dementia and seeks a neurocognitive evaluation.  This has been going on for quite a while.  No obvious triggers.  He reports symptoms of depression, including  lack of motivation, low energy, and a preference for sleeping. He is currently taking Wellbutrin  and Prozac , but notes that he has not found any pills that help with his energy levels. He has tried infusions for energy without success.  He has a history of prostate cancer and is currently under the care of a urologist. His urologist recently changed his medication for his prostate condition to another medication, which he reports has been effective. He mentions wearing 'paper pants' due to his condition.  He has been receiving orthopedic care for his shoulder and hip, including injections for arthritis. He notes that the injections have been effective for his thumb but less so for his shoulder, which he describes as particularly bothersome.         Objective:  Physical Exam: BP 126/86   Pulse 77   Temp (!) 97 F (36.1 C) (Temporal)   Wt 180 lb 3.2 oz (81.7 kg)   SpO2 98%   BMI 24.44 kg/m   Gen: No acute distress, resting comfortably CV: Irregular rate and rhythm with no murmurs appreciated Pulm: Normal work of breathing, clear to auscultation bilaterally with no crackles, wheezes, or rhonchi Neuro: Grossly normal, moves all extremities Psych: Normal affect and thought content      Luis Sells M. Kennyth, MD 01/29/2024 8:52 AM

## 2024-01-29 NOTE — Assessment & Plan Note (Signed)
 Blood pressure at goal today without meds.

## 2024-01-29 NOTE — Assessment & Plan Note (Signed)
 Patient has noticed more issues with short-term memory and recently.  He would like to be evaluated for dementia.  Will place referral to neurology today.  We will check labs including CBC, c-Met, TSH, folate, thiamine, and B12.  May consider MRI if he cannot see neurology in a reasonable timeframe.

## 2024-01-29 NOTE — Assessment & Plan Note (Signed)
 Check lipids.  Follows with cardiology.  On Zetia  10 mg daily and Lipitor  80 mg daily.

## 2024-01-29 NOTE — Assessment & Plan Note (Signed)
 Patient has had decreased motivation and more anhedonia lately.  He is currently on Prozac  20 mg daily and Wellbutrin  150 mg daily that is managed by different provider.  He will continue on this for now though would consider adjusting the dose of either of these if continues to have significant issues with anhedonia.

## 2024-01-31 DIAGNOSIS — R269 Unspecified abnormalities of gait and mobility: Secondary | ICD-10-CM | POA: Diagnosis not present

## 2024-01-31 DIAGNOSIS — M25812 Other specified joint disorders, left shoulder: Secondary | ICD-10-CM | POA: Diagnosis not present

## 2024-01-31 DIAGNOSIS — M25512 Pain in left shoulder: Secondary | ICD-10-CM | POA: Diagnosis not present

## 2024-01-31 DIAGNOSIS — M25562 Pain in left knee: Secondary | ICD-10-CM | POA: Diagnosis not present

## 2024-02-03 ENCOUNTER — Ambulatory Visit: Payer: Self-pay | Admitting: Family Medicine

## 2024-02-03 LAB — VITAMIN B1: Vitamin B1 (Thiamine): 15 nmol/L (ref 8–30)

## 2024-02-03 NOTE — Progress Notes (Signed)
 His A1c is mildly elevated at 5.9 however the rest of his labs are all within range including his testosterone .  No obvious explanation for symptoms based on his labs.  As we discussed at his office visit would be a good idea for him to see neurology.  He should let us  know if he cannot see them soon and we can order a brain MRI.

## 2024-02-05 DIAGNOSIS — M25812 Other specified joint disorders, left shoulder: Secondary | ICD-10-CM | POA: Diagnosis not present

## 2024-02-05 DIAGNOSIS — R269 Unspecified abnormalities of gait and mobility: Secondary | ICD-10-CM | POA: Diagnosis not present

## 2024-02-05 DIAGNOSIS — M25512 Pain in left shoulder: Secondary | ICD-10-CM | POA: Diagnosis not present

## 2024-02-05 DIAGNOSIS — M25562 Pain in left knee: Secondary | ICD-10-CM | POA: Diagnosis not present

## 2024-02-12 DIAGNOSIS — M25562 Pain in left knee: Secondary | ICD-10-CM | POA: Diagnosis not present

## 2024-02-12 DIAGNOSIS — M25812 Other specified joint disorders, left shoulder: Secondary | ICD-10-CM | POA: Diagnosis not present

## 2024-02-12 DIAGNOSIS — R269 Unspecified abnormalities of gait and mobility: Secondary | ICD-10-CM | POA: Diagnosis not present

## 2024-02-12 DIAGNOSIS — M25512 Pain in left shoulder: Secondary | ICD-10-CM | POA: Diagnosis not present

## 2024-02-14 DIAGNOSIS — R269 Unspecified abnormalities of gait and mobility: Secondary | ICD-10-CM | POA: Diagnosis not present

## 2024-02-14 DIAGNOSIS — M25512 Pain in left shoulder: Secondary | ICD-10-CM | POA: Diagnosis not present

## 2024-02-14 DIAGNOSIS — M25562 Pain in left knee: Secondary | ICD-10-CM | POA: Diagnosis not present

## 2024-02-14 DIAGNOSIS — M25812 Other specified joint disorders, left shoulder: Secondary | ICD-10-CM | POA: Diagnosis not present

## 2024-02-18 ENCOUNTER — Telehealth: Payer: Self-pay

## 2024-02-18 NOTE — Telephone Encounter (Signed)
   Pre-operative Risk Assessment    Patient Name: Luis [name]  DOB: 03/13/1935 MRN: [id number]   Date of last office visit: 11/28/23 DAMIEN BRAVER, NP Date of next office visit: NONE   Request for Surgical Clearance    Procedure:  MRI FUSION BIOPSY  Date of Surgery:  Clearance TBD                                Surgeon:  DR NORLEEN SELTZER Surgeon's Group or Practice Name:  [facility] Phone number:  [phone] Fax number:  [phone]   Type of Clearance Requested:   - Medical  - Pharmacy:  Hold Apixaban  (Eliquis ) 3 DAYS RPRIOR   Type of Anesthesia:  Not Indicated   Additional requests/questions:    Signed, Lucie DELENA Ku   02/18/2024, 1:48 PM

## 2024-02-19 DIAGNOSIS — M25512 Pain in left shoulder: Secondary | ICD-10-CM | POA: Diagnosis not present

## 2024-02-19 DIAGNOSIS — M25812 Other specified joint disorders, left shoulder: Secondary | ICD-10-CM | POA: Diagnosis not present

## 2024-02-19 DIAGNOSIS — M25562 Pain in left knee: Secondary | ICD-10-CM | POA: Diagnosis not present

## 2024-02-19 DIAGNOSIS — R269 Unspecified abnormalities of gait and mobility: Secondary | ICD-10-CM | POA: Diagnosis not present

## 2024-02-20 DIAGNOSIS — M25562 Pain in left knee: Secondary | ICD-10-CM | POA: Diagnosis not present

## 2024-02-20 DIAGNOSIS — R269 Unspecified abnormalities of gait and mobility: Secondary | ICD-10-CM | POA: Diagnosis not present

## 2024-02-20 DIAGNOSIS — M25812 Other specified joint disorders, left shoulder: Secondary | ICD-10-CM | POA: Diagnosis not present

## 2024-02-20 DIAGNOSIS — M25512 Pain in left shoulder: Secondary | ICD-10-CM | POA: Diagnosis not present

## 2024-02-21 DIAGNOSIS — M25512 Pain in left shoulder: Secondary | ICD-10-CM | POA: Diagnosis not present

## 2024-02-21 DIAGNOSIS — M25562 Pain in left knee: Secondary | ICD-10-CM | POA: Diagnosis not present

## 2024-02-21 DIAGNOSIS — R269 Unspecified abnormalities of gait and mobility: Secondary | ICD-10-CM | POA: Diagnosis not present

## 2024-02-21 DIAGNOSIS — M25812 Other specified joint disorders, left shoulder: Secondary | ICD-10-CM | POA: Diagnosis not present

## 2024-02-21 NOTE — Telephone Encounter (Signed)
 Patient with diagnosis of afib on Eliquis  for anticoagulation.    Procedure: MRI FUSION BIOPSY  Date of procedure: TBD   CHA2DS2-VASc Score = 6   This indicates a 9.7% annual risk of stroke. The patient's score is based upon: CHF History: 0 HTN History: 1 Diabetes History: 0 Stroke History: 2 Vascular Disease History: 1 Age Score: 2 Gender Score: 0      CrCl 59 ml/min Platelet count 226  Patient has not had an Afib/aflutter ablation or Watchman within the last 3 months or DCCV within the last 30 days   Per office protocol, patient can hold Eliquis  for 3 days prior to procedure.    **This guidance is not considered finalized until pre-operative APP has relayed final recommendations.**

## 2024-02-21 NOTE — Telephone Encounter (Signed)
   Name: Luis [name]  DOB: 1934/12/07  MRN: [id number]  Primary Cardiologist: Lonni Cash, MD   Preoperative team, please contact this patient and set up a phone call appointment for further preoperative risk assessment. Please obtain consent and complete medication review. Thank you for your help.  I confirm that guidance regarding antiplatelet and oral anticoagulation therapy has been completed and, if necessary, noted below.  Per office protocol, patient can hold Eliquis  for 3 days prior to procedure.    I also confirmed the patient resides in the state of [place] . As per [facility] telemedicine laws, the patient must reside in the state in which the provider is licensed.   Lamarr Satterfield, NP 02/21/2024, 1:18 PM [facility]

## 2024-02-21 NOTE — Telephone Encounter (Signed)
 Tried contacting patient to scheduled TELEVISIT no answer left a detailed vm to call back and schedule

## 2024-02-24 NOTE — Telephone Encounter (Signed)
 2nd attempt : Called patient to schedule pre-op clearance telehealth appointment. NA left message on VM.

## 2024-02-25 NOTE — Telephone Encounter (Signed)
 3rd attempt: Called and spoke with patient, he would like for me to call back because he is driving right now.

## 2024-02-26 DIAGNOSIS — R269 Unspecified abnormalities of gait and mobility: Secondary | ICD-10-CM | POA: Diagnosis not present

## 2024-02-26 DIAGNOSIS — M25812 Other specified joint disorders, left shoulder: Secondary | ICD-10-CM | POA: Diagnosis not present

## 2024-02-26 DIAGNOSIS — M25562 Pain in left knee: Secondary | ICD-10-CM | POA: Diagnosis not present

## 2024-02-26 DIAGNOSIS — M25512 Pain in left shoulder: Secondary | ICD-10-CM | POA: Diagnosis not present

## 2024-02-26 NOTE — Telephone Encounter (Signed)
 Several attempts made to patient to schedule cardiac clearance 02/26/24 LMVM Will contact requesting office to update them.

## 2024-02-28 DIAGNOSIS — M25562 Pain in left knee: Secondary | ICD-10-CM | POA: Diagnosis not present

## 2024-02-28 DIAGNOSIS — M25512 Pain in left shoulder: Secondary | ICD-10-CM | POA: Diagnosis not present

## 2024-02-28 DIAGNOSIS — R269 Unspecified abnormalities of gait and mobility: Secondary | ICD-10-CM | POA: Diagnosis not present

## 2024-02-28 DIAGNOSIS — M25812 Other specified joint disorders, left shoulder: Secondary | ICD-10-CM | POA: Diagnosis not present

## 2024-03-05 DIAGNOSIS — M25812 Other specified joint disorders, left shoulder: Secondary | ICD-10-CM | POA: Diagnosis not present

## 2024-03-05 DIAGNOSIS — M25512 Pain in left shoulder: Secondary | ICD-10-CM | POA: Diagnosis not present

## 2024-03-05 DIAGNOSIS — M25562 Pain in left knee: Secondary | ICD-10-CM | POA: Diagnosis not present

## 2024-03-05 DIAGNOSIS — R269 Unspecified abnormalities of gait and mobility: Secondary | ICD-10-CM | POA: Diagnosis not present

## 2024-03-06 DIAGNOSIS — M25562 Pain in left knee: Secondary | ICD-10-CM | POA: Diagnosis not present

## 2024-03-06 DIAGNOSIS — M25512 Pain in left shoulder: Secondary | ICD-10-CM | POA: Diagnosis not present

## 2024-03-06 DIAGNOSIS — R269 Unspecified abnormalities of gait and mobility: Secondary | ICD-10-CM | POA: Diagnosis not present

## 2024-03-06 DIAGNOSIS — M25812 Other specified joint disorders, left shoulder: Secondary | ICD-10-CM | POA: Diagnosis not present

## 2024-03-13 DIAGNOSIS — M25512 Pain in left shoulder: Secondary | ICD-10-CM | POA: Diagnosis not present

## 2024-03-13 DIAGNOSIS — R269 Unspecified abnormalities of gait and mobility: Secondary | ICD-10-CM | POA: Diagnosis not present

## 2024-03-13 DIAGNOSIS — M25562 Pain in left knee: Secondary | ICD-10-CM | POA: Diagnosis not present

## 2024-03-13 DIAGNOSIS — M25812 Other specified joint disorders, left shoulder: Secondary | ICD-10-CM | POA: Diagnosis not present

## 2024-03-18 DIAGNOSIS — R269 Unspecified abnormalities of gait and mobility: Secondary | ICD-10-CM | POA: Diagnosis not present

## 2024-03-18 DIAGNOSIS — M25512 Pain in left shoulder: Secondary | ICD-10-CM | POA: Diagnosis not present

## 2024-03-18 DIAGNOSIS — M25812 Other specified joint disorders, left shoulder: Secondary | ICD-10-CM | POA: Diagnosis not present

## 2024-03-18 DIAGNOSIS — M25562 Pain in left knee: Secondary | ICD-10-CM | POA: Diagnosis not present

## 2024-03-20 DIAGNOSIS — M25812 Other specified joint disorders, left shoulder: Secondary | ICD-10-CM | POA: Diagnosis not present

## 2024-03-20 DIAGNOSIS — R269 Unspecified abnormalities of gait and mobility: Secondary | ICD-10-CM | POA: Diagnosis not present

## 2024-03-20 DIAGNOSIS — M25562 Pain in left knee: Secondary | ICD-10-CM | POA: Diagnosis not present

## 2024-03-20 DIAGNOSIS — M25512 Pain in left shoulder: Secondary | ICD-10-CM | POA: Diagnosis not present

## 2024-03-27 DIAGNOSIS — M25562 Pain in left knee: Secondary | ICD-10-CM | POA: Diagnosis not present

## 2024-03-27 DIAGNOSIS — M25812 Other specified joint disorders, left shoulder: Secondary | ICD-10-CM | POA: Diagnosis not present

## 2024-03-27 DIAGNOSIS — R269 Unspecified abnormalities of gait and mobility: Secondary | ICD-10-CM | POA: Diagnosis not present

## 2024-03-27 DIAGNOSIS — M25512 Pain in left shoulder: Secondary | ICD-10-CM | POA: Diagnosis not present

## 2024-04-01 DIAGNOSIS — R269 Unspecified abnormalities of gait and mobility: Secondary | ICD-10-CM | POA: Diagnosis not present

## 2024-04-01 DIAGNOSIS — M25812 Other specified joint disorders, left shoulder: Secondary | ICD-10-CM | POA: Diagnosis not present

## 2024-04-01 DIAGNOSIS — M25562 Pain in left knee: Secondary | ICD-10-CM | POA: Diagnosis not present

## 2024-04-01 DIAGNOSIS — M25512 Pain in left shoulder: Secondary | ICD-10-CM | POA: Diagnosis not present

## 2024-04-03 DIAGNOSIS — S8002XA Contusion of left knee, initial encounter: Secondary | ICD-10-CM | POA: Diagnosis not present

## 2024-04-10 DIAGNOSIS — M25562 Pain in left knee: Secondary | ICD-10-CM | POA: Diagnosis not present

## 2024-04-10 DIAGNOSIS — R269 Unspecified abnormalities of gait and mobility: Secondary | ICD-10-CM | POA: Diagnosis not present

## 2024-04-10 DIAGNOSIS — M25812 Other specified joint disorders, left shoulder: Secondary | ICD-10-CM | POA: Diagnosis not present

## 2024-04-10 DIAGNOSIS — M25512 Pain in left shoulder: Secondary | ICD-10-CM | POA: Diagnosis not present

## 2024-04-16 ENCOUNTER — Encounter: Admitting: Family Medicine

## 2024-04-17 DIAGNOSIS — M25512 Pain in left shoulder: Secondary | ICD-10-CM | POA: Diagnosis not present

## 2024-04-17 DIAGNOSIS — M25812 Other specified joint disorders, left shoulder: Secondary | ICD-10-CM | POA: Diagnosis not present

## 2024-04-17 DIAGNOSIS — R269 Unspecified abnormalities of gait and mobility: Secondary | ICD-10-CM | POA: Diagnosis not present

## 2024-04-17 DIAGNOSIS — M25562 Pain in left knee: Secondary | ICD-10-CM | POA: Diagnosis not present

## 2024-04-29 DIAGNOSIS — R269 Unspecified abnormalities of gait and mobility: Secondary | ICD-10-CM | POA: Diagnosis not present

## 2024-04-29 DIAGNOSIS — M23222 Derangement of posterior horn of medial meniscus due to old tear or injury, left knee: Secondary | ICD-10-CM | POA: Diagnosis not present

## 2024-04-29 DIAGNOSIS — M23242 Derangement of anterior horn of lateral meniscus due to old tear or injury, left knee: Secondary | ICD-10-CM | POA: Diagnosis not present

## 2024-04-29 DIAGNOSIS — M25562 Pain in left knee: Secondary | ICD-10-CM | POA: Diagnosis not present

## 2024-04-29 DIAGNOSIS — S72142K Displaced intertrochanteric fracture of left femur, subsequent encounter for closed fracture with nonunion: Secondary | ICD-10-CM | POA: Diagnosis not present

## 2024-04-29 DIAGNOSIS — M25812 Other specified joint disorders, left shoulder: Secondary | ICD-10-CM | POA: Diagnosis not present

## 2024-04-29 DIAGNOSIS — M25512 Pain in left shoulder: Secondary | ICD-10-CM | POA: Diagnosis not present

## 2024-05-01 DIAGNOSIS — M25812 Other specified joint disorders, left shoulder: Secondary | ICD-10-CM | POA: Diagnosis not present

## 2024-05-01 DIAGNOSIS — M25562 Pain in left knee: Secondary | ICD-10-CM | POA: Diagnosis not present

## 2024-05-01 DIAGNOSIS — M25512 Pain in left shoulder: Secondary | ICD-10-CM | POA: Diagnosis not present

## 2024-05-01 DIAGNOSIS — R269 Unspecified abnormalities of gait and mobility: Secondary | ICD-10-CM | POA: Diagnosis not present

## 2024-05-04 DIAGNOSIS — M25512 Pain in left shoulder: Secondary | ICD-10-CM | POA: Diagnosis not present

## 2024-05-04 DIAGNOSIS — M25562 Pain in left knee: Secondary | ICD-10-CM | POA: Diagnosis not present

## 2024-05-04 DIAGNOSIS — R269 Unspecified abnormalities of gait and mobility: Secondary | ICD-10-CM | POA: Diagnosis not present

## 2024-05-04 DIAGNOSIS — M25812 Other specified joint disorders, left shoulder: Secondary | ICD-10-CM | POA: Diagnosis not present

## 2024-05-13 DIAGNOSIS — R269 Unspecified abnormalities of gait and mobility: Secondary | ICD-10-CM | POA: Diagnosis not present

## 2024-05-13 DIAGNOSIS — M25812 Other specified joint disorders, left shoulder: Secondary | ICD-10-CM | POA: Diagnosis not present

## 2024-05-13 DIAGNOSIS — M25512 Pain in left shoulder: Secondary | ICD-10-CM | POA: Diagnosis not present

## 2024-05-13 DIAGNOSIS — M25562 Pain in left knee: Secondary | ICD-10-CM | POA: Diagnosis not present

## 2024-05-15 DIAGNOSIS — M25812 Other specified joint disorders, left shoulder: Secondary | ICD-10-CM | POA: Diagnosis not present

## 2024-05-15 DIAGNOSIS — R269 Unspecified abnormalities of gait and mobility: Secondary | ICD-10-CM | POA: Diagnosis not present

## 2024-05-15 DIAGNOSIS — M25562 Pain in left knee: Secondary | ICD-10-CM | POA: Diagnosis not present

## 2024-05-15 DIAGNOSIS — M25512 Pain in left shoulder: Secondary | ICD-10-CM | POA: Diagnosis not present

## 2024-05-20 DIAGNOSIS — M25512 Pain in left shoulder: Secondary | ICD-10-CM | POA: Diagnosis not present

## 2024-05-20 DIAGNOSIS — M25812 Other specified joint disorders, left shoulder: Secondary | ICD-10-CM | POA: Diagnosis not present

## 2024-05-20 DIAGNOSIS — R269 Unspecified abnormalities of gait and mobility: Secondary | ICD-10-CM | POA: Diagnosis not present

## 2024-05-20 DIAGNOSIS — M25562 Pain in left knee: Secondary | ICD-10-CM | POA: Diagnosis not present

## 2024-05-22 DIAGNOSIS — R269 Unspecified abnormalities of gait and mobility: Secondary | ICD-10-CM | POA: Diagnosis not present

## 2024-05-22 DIAGNOSIS — M25812 Other specified joint disorders, left shoulder: Secondary | ICD-10-CM | POA: Diagnosis not present

## 2024-05-22 DIAGNOSIS — M25562 Pain in left knee: Secondary | ICD-10-CM | POA: Diagnosis not present

## 2024-05-22 DIAGNOSIS — M25512 Pain in left shoulder: Secondary | ICD-10-CM | POA: Diagnosis not present

## 2024-05-27 DIAGNOSIS — M25562 Pain in left knee: Secondary | ICD-10-CM | POA: Diagnosis not present

## 2024-05-27 DIAGNOSIS — M25812 Other specified joint disorders, left shoulder: Secondary | ICD-10-CM | POA: Diagnosis not present

## 2024-05-27 DIAGNOSIS — M25512 Pain in left shoulder: Secondary | ICD-10-CM | POA: Diagnosis not present

## 2024-05-27 DIAGNOSIS — R269 Unspecified abnormalities of gait and mobility: Secondary | ICD-10-CM | POA: Diagnosis not present

## 2024-06-08 ENCOUNTER — Ambulatory Visit: Admitting: Family Medicine

## 2024-06-08 ENCOUNTER — Encounter: Payer: Self-pay | Admitting: Family Medicine

## 2024-06-08 VITALS — BP 138/82 | HR 58 | Temp 97.3°F | Ht 72.0 in | Wt 173.8 lb

## 2024-06-08 DIAGNOSIS — I10 Essential (primary) hypertension: Secondary | ICD-10-CM | POA: Diagnosis not present

## 2024-06-08 LAB — CBC
HCT: 42.2 % (ref 39.0–52.0)
Hemoglobin: 14.2 g/dL (ref 13.0–17.0)
MCHC: 33.7 g/dL (ref 30.0–36.0)
MCV: 92.1 fl (ref 78.0–100.0)
Platelets: 238 K/uL (ref 150.0–400.0)
RBC: 4.59 Mil/uL (ref 4.22–5.81)
RDW: 14.2 % (ref 11.5–15.5)
WBC: 4.1 K/uL (ref 4.0–10.5)

## 2024-06-08 LAB — COMPREHENSIVE METABOLIC PANEL WITH GFR
ALT: 13 U/L (ref 3–53)
AST: 16 U/L (ref 5–37)
Albumin: 4.1 g/dL (ref 3.5–5.2)
Alkaline Phosphatase: 84 U/L (ref 39–117)
BUN: 12 mg/dL (ref 6–23)
CO2: 27 meq/L (ref 19–32)
Calcium: 9.2 mg/dL (ref 8.4–10.5)
Chloride: 106 meq/L (ref 96–112)
Creatinine, Ser: 1.01 mg/dL (ref 0.40–1.50)
GFR: 66.95 mL/min
Glucose, Bld: 99 mg/dL (ref 70–99)
Potassium: 4.1 meq/L (ref 3.5–5.1)
Sodium: 142 meq/L (ref 135–145)
Total Bilirubin: 0.7 mg/dL (ref 0.2–1.2)
Total Protein: 6.4 g/dL (ref 6.0–8.3)

## 2024-06-08 LAB — MAGNESIUM: Magnesium: 2 mg/dL (ref 1.5–2.5)

## 2024-06-08 LAB — TSH: TSH: 1.66 u[IU]/mL (ref 0.35–5.50)

## 2024-06-08 MED ORDER — AMLODIPINE BESYLATE 5 MG PO TABS: 5.0000 mg | ORAL_TABLET | Freq: Every day | ORAL | 3 refills | Status: AC

## 2024-06-08 NOTE — Patient Instructions (Signed)
 It was very nice to see you today!  VISIT SUMMARY: Today, we addressed your recent high blood pressure readings. We discussed your history with hypertension and your current symptoms. We also reviewed your physical therapy for knee instability.  YOUR PLAN: PRIMARY HYPERTENSION: Your blood pressure has been high recently, reaching 190/90 mmHg. This is a significant increase from your usual readings. -We have restarted your amlodipine  at a low dose to help control your blood pressure. -We ordered comprehensive blood work to check your electrolytes, blood counts, and thyroid , kidney, and liver function. -Please check your blood pressure before you leave the clinic today. -Follow up in two weeks to see how the medication is working.  Return if symptoms worsen or fail to improve.   Take care, Dr Kennyth  PLEASE NOTE:  If you had any lab tests, please let us  know if you have not heard back within a few days. You may see your results on mychart before we have a chance to review them but we will give you a call once they are reviewed by us .   If we ordered any referrals today, please let us  know if you have not heard from their office within the next week.   If you had any urgent prescriptions sent in today, please check with the pharmacy within an hour of our visit to make sure the prescription was transmitted appropriately.   Please try these tips to maintain a healthy lifestyle:  Eat at least 3 REAL meals and 1-2 snacks per day.  Aim for no more than 5 hours between eating.  If you eat breakfast, please do so within one hour of getting up.   Each meal should contain half fruits/vegetables, one quarter protein, and one quarter carbs (no bigger than a computer mouse)  Cut down on sweet beverages. This includes juice, soda, and sweet tea.   Drink at least 1 glass of water  with each meal and aim for at least 8 glasses per day  Exercise at least 150 minutes every week.

## 2024-06-08 NOTE — Progress Notes (Signed)
" ° °  Luis [name] is a 89 y.o. male who presents today for an office visit.  Assessment/Plan:  Chronic Problems Addressed Today: HTN (hypertension) Patient with recent elevation in blood pressure readings including readings as high as in 190s over 90s.  Blood pressure is better controlled today however he would like to restart his amlodipine .  Previously did well with this before this was discontinued by cardiology.  Will restart 5 mg daily.  He is aware of potential side effects.  Will check labs today.  He will monitor blood pressure at home follow-up with us  in a few weeks via MyChart.     Subjective:  HPI:  See assessment / plan for status of chronic conditions.   Discussed the use of AI scribe software for clinical note transcription with the patient, who gave verbal consent to proceed.  History of Present Illness Luis [name] is an 89 year old male who presents with elevated blood pressure readings.  He has experienced extremely high blood pressure readings, around 190/90 mmHg, for approximately a week. He mentions that his blood pressure is usually normal, and he has not noticed when it started to rise, although he sometimes feels symptoms associated with high blood pressure.  Several years ago, he was prescribed amlodipine  for hypertension, but it was later discontinued due to normal blood pressure readings. He does not recall the exact timeline of these events.         Objective:  Physical Exam: BP 138/82   Pulse (!) 58   Temp (!) 97.3 F (36.3 C) (Temporal)   Ht 6' (1.829 m)   Wt 173 lb 12.8 oz (78.8 kg)   SpO2 98%   BMI 23.57 kg/m   Gen: No acute distress, resting comfortably CV: Regular rate and rhythm with no murmurs appreciated Pulm: Normal work of breathing, clear to auscultation bilaterally with no crackles, wheezes, or rhonchi Neuro: Grossly normal, moves all extremities Psych: Normal affect and thought content      Luis Gustafson M. Kennyth, MD 06/08/2024  11:16 AM  "

## 2024-06-08 NOTE — Assessment & Plan Note (Signed)
 Patient with recent elevation in blood pressure readings including readings as high as in 190s over 90s.  Blood pressure is better controlled today however he would like to restart his amlodipine .  Previously did well with this before this was discontinued by cardiology.  Will restart 5 mg daily.  He is aware of potential side effects.  Will check labs today.  He will monitor blood pressure at home follow-up with us  in a few weeks via MyChart.

## 2024-06-09 ENCOUNTER — Ambulatory Visit: Payer: Self-pay | Admitting: Family Medicine

## 2024-06-09 NOTE — Progress Notes (Signed)
 Blood work is all at goal. We can recheck at his next office visit here.

## 2024-07-10 ENCOUNTER — Encounter: Admitting: Family Medicine

## 2024-07-14 ENCOUNTER — Ambulatory Visit: Payer: Self-pay

## 2024-07-14 NOTE — Telephone Encounter (Signed)
 FYI Only or Action Required?: FYI only for provider: appointment scheduled on 2/4.  Patient was last seen in primary care on 06/08/2024 by Kennyth Worth HERO, MD.  Called Nurse Triage reporting Headache and Altered Mental Status.  Symptoms began several days ago.  Interventions attempted: OTC medications: Tylenol .  Symptoms are: unchanged.  Triage Disposition: See PCP When Office is Open (Within 3 Days)  Patient/caregiver understands and will follow disposition?: Yes     Message from [facility] sent at 07/14/2024 10:32 AM EST  Summary: Severe on and off headache, possible infection   Reason for Triage: Pt has had headache for 2-3 days, comes and goes. Had dental implants last week and wife is concerned about infection (due to slight temp of 99). He is blind in one eye and wife says he is not seeing as well as he did. Patient states the headache is severe but wife thinks it is likely not.      Reason for Disposition  [1] MILD-MODERATE headache AND [2] present > 3 days (72 hours) AND [3] no improvement after using Care Advice  Answer Assessment - Initial Assessment Questions 1. LOCATION: Where does it hurt?      Front of head   2. ONSET: When did the headache start? (e.g., minutes, hours, days)      X 3 days  3. PATTERN: Does the pain come and go, or has it been constant since it started?     Intermittent   4. SEVERITY: How bad is the pain? and What does it keep you from doing?  (e.g., Scale 1-10; mild, moderate, or severe)     Mild   5. RECURRENT SYMPTOM: Have you ever had headaches before? If Yes, ask: When was the last time? and What happened that time?     Yes  6. CAUSE: What do you think is causing the headache?     Dental implants, wife is unsure  7. MIGRAINE: Have you been diagnosed with migraine headaches? If Yes, ask: Is this headache similar?      No   8. HEAD INJURY: Has there been any recent injury to your head?      No  9. OTHER  SYMPTOMS: Do you have any other symptoms? (e.g., fever, stiff neck, eye pain, sore throat, cold symptoms)     Increased confusion noted.      Patient's wife called in to triage with complaints of headache in patient. This has been ongoing for 3 days. She stated patient had dental implants last week and wife is concerned about infection (due to slight temp of 99)-now resolved. She also stated, he is blind in one eye and wife says he is not seeing as well as he did; gait is off, but he is having increased confusion.  Patient was seen by dentist for follow-up 2 days ago.  For home care, the patient is taking   Appointment scheduled for further evaluation; Patient agrees with the plan of care, and will reach out if symptoms worsen or persist.  Protocols used: [facility]

## 2024-07-14 NOTE — Telephone Encounter (Signed)
 Noted.

## 2024-07-15 ENCOUNTER — Emergency Department (HOSPITAL_COMMUNITY)

## 2024-07-15 ENCOUNTER — Ambulatory Visit: Admitting: Family Medicine

## 2024-07-15 ENCOUNTER — Inpatient Hospital Stay (HOSPITAL_COMMUNITY): Admission: EM | Admit: 2024-07-15 | Source: Home / Self Care | Admitting: Family Medicine

## 2024-07-15 ENCOUNTER — Other Ambulatory Visit: Payer: Self-pay

## 2024-07-15 ENCOUNTER — Encounter (HOSPITAL_COMMUNITY): Payer: Self-pay | Admitting: *Deleted

## 2024-07-15 ENCOUNTER — Ambulatory Visit (HOSPITAL_COMMUNITY)
Admission: RE | Admit: 2024-07-15 | Discharge: 2024-07-15 | Disposition: A | Source: Ambulatory Visit | Attending: Family Medicine | Admitting: Family Medicine

## 2024-07-15 VITALS — BP 132/70 | HR 98 | Temp 97.3°F | Ht 72.0 in | Wt 173.0 lb

## 2024-07-15 DIAGNOSIS — I4891 Unspecified atrial fibrillation: Secondary | ICD-10-CM | POA: Diagnosis present

## 2024-07-15 DIAGNOSIS — I10 Essential (primary) hypertension: Secondary | ICD-10-CM | POA: Diagnosis present

## 2024-07-15 DIAGNOSIS — Z789 Other specified health status: Secondary | ICD-10-CM

## 2024-07-15 DIAGNOSIS — R519 Headache, unspecified: Secondary | ICD-10-CM

## 2024-07-15 DIAGNOSIS — I4819 Other persistent atrial fibrillation: Secondary | ICD-10-CM

## 2024-07-15 DIAGNOSIS — R410 Disorientation, unspecified: Secondary | ICD-10-CM

## 2024-07-15 DIAGNOSIS — I639 Cerebral infarction, unspecified: Secondary | ICD-10-CM | POA: Diagnosis present

## 2024-07-15 DIAGNOSIS — Z9889 Other specified postprocedural states: Secondary | ICD-10-CM

## 2024-07-15 DIAGNOSIS — E785 Hyperlipidemia, unspecified: Secondary | ICD-10-CM | POA: Diagnosis present

## 2024-07-15 DIAGNOSIS — H5440 Blindness, one eye, unspecified eye: Secondary | ICD-10-CM | POA: Diagnosis present

## 2024-07-15 DIAGNOSIS — H53461 Homonymous bilateral field defects, right side: Secondary | ICD-10-CM | POA: Diagnosis present

## 2024-07-15 DIAGNOSIS — I63531 Cerebral infarction due to unspecified occlusion or stenosis of right posterior cerebral artery: Principal | ICD-10-CM

## 2024-07-15 LAB — DIFFERENTIAL
Abs Immature Granulocytes: 0.02 10*3/uL (ref 0.00–0.07)
Basophils Absolute: 0 10*3/uL (ref 0.0–0.1)
Basophils Relative: 1 %
Eosinophils Absolute: 0.1 10*3/uL (ref 0.0–0.5)
Eosinophils Relative: 2 %
Immature Granulocytes: 0 %
Lymphocytes Relative: 18 %
Lymphs Abs: 1.2 10*3/uL (ref 0.7–4.0)
Monocytes Absolute: 0.6 10*3/uL (ref 0.1–1.0)
Monocytes Relative: 9 %
Neutro Abs: 4.6 10*3/uL (ref 1.7–7.7)
Neutrophils Relative %: 70 %

## 2024-07-15 LAB — COMPREHENSIVE METABOLIC PANEL WITH GFR
ALT: 19 U/L (ref 3–53)
ALT: 23 U/L (ref 0–44)
AST: 20 U/L (ref 5–37)
AST: 27 U/L (ref 15–41)
Albumin: 4.3 g/dL (ref 3.5–5.2)
Albumin: 4.2 g/dL (ref 3.5–5.0)
Alkaline Phosphatase: 77 U/L (ref 39–117)
Alkaline Phosphatase: 95 U/L (ref 38–126)
Anion gap: 10 (ref 5–15)
BUN: 14 mg/dL (ref 6–23)
BUN: 15 mg/dL (ref 8–23)
CO2: 26 meq/L (ref 19–32)
CO2: 25 mmol/L (ref 22–32)
Calcium: 9.5 mg/dL (ref 8.4–10.5)
Calcium: 9.4 mg/dL (ref 8.9–10.3)
Chloride: 104 meq/L (ref 96–112)
Chloride: 102 mmol/L (ref 98–111)
Creatinine, Ser: 1.04 mg/dL (ref 0.40–1.50)
Creatinine, Ser: 1.02 mg/dL (ref 0.61–1.24)
GFR, Estimated: >60 mL/min
GFR: 64.59 mL/min
Glucose, Bld: 134 mg/dL — ABNORMAL HIGH (ref 70–99)
Glucose, Bld: 101 mg/dL — ABNORMAL HIGH (ref 70–99)
Potassium: 4.6 meq/L (ref 3.5–5.1)
Potassium: 4.6 mmol/L (ref 3.5–5.1)
Sodium: 141 meq/L (ref 135–145)
Sodium: 137 mmol/L (ref 135–145)
Total Bilirubin: 0.7 mg/dL (ref 0.2–1.2)
Total Bilirubin: 0.4 mg/dL (ref 0.0–1.2)
Total Protein: 6.7 g/dL (ref 6.0–8.3)
Total Protein: 6.6 g/dL (ref 6.5–8.1)

## 2024-07-15 LAB — I-STAT CHEM 8, ED
BUN: 16 mg/dL (ref 8–23)
Calcium, Ion: 1.07 mmol/L — ABNORMAL LOW (ref 1.15–1.40)
Chloride: 105 mmol/L (ref 98–111)
Creatinine, Ser: 1.2 mg/dL (ref 0.61–1.24)
Glucose, Bld: 99 mg/dL (ref 70–99)
HCT: 44 % (ref 39.0–52.0)
Hemoglobin: 15 g/dL (ref 13.0–17.0)
Potassium: 4.2 mmol/L (ref 3.5–5.1)
Sodium: 139 mmol/L (ref 135–145)
TCO2: 24 mmol/L (ref 22–32)

## 2024-07-15 LAB — CBC
HCT: 45.4 % (ref 39.0–52.0)
HCT: 45.7 % (ref 39.0–52.0)
Hemoglobin: 15.3 g/dL (ref 13.0–17.0)
Hemoglobin: 15.4 g/dL (ref 13.0–17.0)
MCH: 31 pg (ref 26.0–34.0)
MCHC: 33.7 g/dL (ref 30.0–36.0)
MCHC: 33.7 g/dL (ref 30.0–36.0)
MCV: 91.7 fl (ref 78.0–100.0)
MCV: 92.1 fL (ref 80.0–100.0)
Platelets: 209 10*3/uL (ref 150.0–400.0)
Platelets: 212 10*3/uL (ref 150–400)
RBC: 4.95 Mil/uL (ref 4.22–5.81)
RBC: 4.96 MIL/uL (ref 4.22–5.81)
RDW: 14.6 % (ref 11.5–15.5)
RDW: 13.7 % (ref 11.5–15.5)
WBC: 5.6 10*3/uL (ref 4.0–10.5)
WBC: 6.5 10*3/uL (ref 4.0–10.5)
nRBC: 0 % (ref 0.0–0.2)

## 2024-07-15 LAB — FOLATE: Folate: 15.3 ng/mL

## 2024-07-15 LAB — VITAMIN B12: Vitamin B-12: 399 pg/mL (ref 211–911)

## 2024-07-15 LAB — APTT: aPTT: 30 s (ref 24–36)

## 2024-07-15 LAB — ETHANOL: Alcohol, Ethyl (B): <15 mg/dL

## 2024-07-15 LAB — CBG MONITORING, ED: Glucose-Capillary: 117 mg/dL — ABNORMAL HIGH (ref 70–99)

## 2024-07-15 LAB — TSH: TSH: 1.07 u[IU]/mL (ref 0.35–5.50)

## 2024-07-15 LAB — PROTIME-INR
INR: 1 (ref 0.8–1.2)
Prothrombin Time: 14.1 s (ref 11.4–15.2)

## 2024-07-15 LAB — IBC + FERRITIN
Ferritin: 55.7 ng/mL (ref 22.0–322.0)
Iron: 113 ug/dL (ref 42–165)
Saturation Ratios: 36.2 % (ref 20.0–50.0)
TIBC: 312.2 ug/dL (ref 250.0–450.0)
Transferrin: 223 mg/dL (ref 212.0–360.0)

## 2024-07-15 MED ORDER — STROKE: EARLY STAGES OF RECOVERY BOOK
Freq: Once | Status: AC
  Filled 2024-07-15: qty 1

## 2024-07-15 MED ORDER — SENNOSIDES-DOCUSATE SODIUM 8.6-50 MG PO TABS: 1.0000 | ORAL_TABLET | Freq: Every evening | ORAL | Status: DC | PRN

## 2024-07-15 MED ORDER — ACETAMINOPHEN 325 MG PO TABS: 650.0000 mg | ORAL_TABLET | ORAL | Status: AC | PRN

## 2024-07-15 MED ORDER — ACETAMINOPHEN 160 MG/5ML PO SOLN: 650.0000 mg | ORAL | Status: DC | PRN

## 2024-07-15 MED ORDER — SODIUM CHLORIDE 0.9% FLUSH
3.0000 mL | Freq: Once | INTRAVENOUS | Status: AC
  Administered 2024-07-15: 3 mL via INTRAVENOUS

## 2024-07-15 MED ORDER — ACETAMINOPHEN 650 MG RE SUPP: 650.0000 mg | RECTAL | Status: DC | PRN

## 2024-07-15 MED ORDER — APIXABAN 5 MG PO TABS
5.0000 mg | ORAL_TABLET | Freq: Two times a day (BID) | ORAL | Status: AC
  Administered 2024-07-16 – 2024-07-17 (×4): 5 mg via ORAL
  Filled 2024-07-15 (×5): qty 1

## 2024-07-15 MED ORDER — ATORVASTATIN CALCIUM 80 MG PO TABS
80.0000 mg | ORAL_TABLET | Freq: Every day | ORAL | Status: AC
  Administered 2024-07-16 – 2024-07-17 (×2): 80 mg via ORAL
  Filled 2024-07-15: qty 1
  Filled 2024-07-15: qty 2

## 2024-07-15 MED ORDER — TAMSULOSIN HCL 0.4 MG PO CAPS
0.4000 mg | ORAL_CAPSULE | Freq: Every day | ORAL | Status: AC
  Administered 2024-07-17: 0.4 mg via ORAL
  Filled 2024-07-15 (×2): qty 1

## 2024-07-15 MED ORDER — IOHEXOL 350 MG/ML SOLN
75.0000 mL | Freq: Once | INTRAVENOUS | Status: AC | PRN
  Administered 2024-07-15: 75 mL via INTRAVENOUS

## 2024-07-15 MED ORDER — BUPROPION HCL ER (XL) 150 MG PO TB24: 150.0000 mg | ORAL_TABLET | Freq: Every day | ORAL | Status: DC

## 2024-07-15 MED ORDER — SODIUM CHLORIDE 0.9 % IV SOLN: INTRAVENOUS | Status: DC

## 2024-07-15 MED ORDER — FLUOXETINE HCL 20 MG PO CAPS
20.0000 mg | ORAL_CAPSULE | Freq: Every day | ORAL | Status: AC
  Administered 2024-07-16 – 2024-07-17 (×2): 20 mg via ORAL
  Filled 2024-07-15 (×2): qty 1

## 2024-07-15 NOTE — ED Triage Notes (Signed)
 Unsteady gait for a few days sl headache

## 2024-07-15 NOTE — H&P (Incomplete)
 "    Hospital Admission History and Physical Service Pager: [phone]  Patient name: BEECHER FURIO Medical record number: [id number] Date of Birth: December 10, 1934 Age: 89 y.o. Gender: male  Primary Care Provider: Kennyth Worth HERO, MD Consultants: Neurology Code Status: Full  Preferred Emergency Contact: Nils, Thor (Spouse) [phone]  Chief Complaint: vertigo  Differential and Medical Decision Making:  BRANDON WIECHMAN is a 89 y.o. male presenting with CVA.   Etiology for CVA includes: - A fib: Pt was on Eliquis , *** - *** Assessment & Plan    Chronic and Stable Conditions: ***  FEN/GI: *** VTE Prophylaxis: ***  Disposition: ***  History of Present Illness:  RIESE HELLARD is a 89 y.o. male presenting with *** Patient accompanied by wife and daughter, who helps supplement history.  Has been participating in hip rehab for a while - sustained hip fracture from fall  Felt more unstable Friday and Saturday  Felt some dizziness Has temp of 99 over the weekend No cough, congestion, rhinorrhea No known sick contacts  Notes vision changes starting Saturday in R eye, is blind in L eye No N/V No chest pain Has afib, not symptomatic  No change in speech, no facial droop  Seemed to run into door frames  Had HA for last couple days - maybe forgot to take Eliquis   Vertigo started *** Saw PCP today, who noted ataxic gate. MRI was ordered, which showed ***, so he was sent to the ED.  In the ED, CTA head and neck showed ***; CT head showed ***. Neuro was consulted.  Review Of Systems: Per HPI with the following additions: ***  Pertinent Past Medical History: - Hx TIA (reports episode 7 years ago where he could not speak) - RBBB - Persistent A fib - MI (08/1999); CAD with MI in 08/1999 s/p BMS-RCA, BMS-Cx, PTCA of OM1 in 2001; overlapping DES in RCA and PTCA to small distal Cx 2013 Remainder reviewed in history tab.  - HTN - HLD  Pertinent Past Surgical History: -  Stents: s/p BMS-RCA, BMS-Cx, PTCA of OM1 in 2001; overlapping DES in RCA and PTCA to small distal Cx 2013  - Left inguinal hernia repair 06/2003 Remainder reviewed in history tab.   Pertinent Social History: Tobacco use: Former; quit 06/1999 (previous 1/2 ppd for 40 years) Alcohol use: Former, quit 1993 Other Substance use: None Lives with wife  Pertinent Family History: Mother: alcohol use (deceased) Father: colon cancer (died in 31s) Sister: alcohol use (deceased)  Important Outpatient Medications: - Amlodipine  5 mg daily - Lipitor  80 mg daily - Eliquis  5 mg BID - maybe missed a few doses in past few days - Wellbutrin  150 mg daily - Fluoxetine  20 mg daily - Ensure TID between meals - Silodosin 4 mg daily - Vit E 1000u daily - not taking  - NTG prn  Is also taking amoxicillin  for dental infection - has 4 days left   Objective: BP (!) 180/106 (BP Location: Left Arm)   Pulse 88   Temp 98.6 F (37 C) (Oral)   Resp 20   Ht 6' (1.829 m)   Wt 78 kg   SpO2 100%   BMI 23.32 kg/m  Exam: General: *** Eyes: *** ENTM: *** Neck: *** Cardiovascular: Irregularly irregular rate and rhythm, no tachycardia, no murmurs/rubs/gallops. Respiratory: Normal work of breathing on room air. Clear to auscultation bilaterally; no wheezes, crackles. Gastrointestinal: Bowel sounds present and normoactive bilaterally. Soft, nondistended, nontender. Derm: *** Neuro: Alert and oriented to person,  place, time, event. 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 Normal sensation of bilateral upper and lower extremities.  Normal strength (5/5) of bilateral lower extremities. Right upper extremity strength 5/5; left upper extremity shoulder strength 4/5 (pt's baseline with hx torn rotator cuff). Psych: Appropriate affect.  Labs:  CBC BMET  Recent Labs  Lab 07/15/24 1801 07/15/24 1805  WBC 6.5  --   HGB 15.4 15.0  HCT 45.7 44.0  PLT 212   --    Recent Labs  Lab 07/15/24 1801 07/15/24 1805  NA 137 139  K 4.6 4.2  CL 102 105  CO2 25  --   BUN 15 16  CREATININE 1.02 1.20  GLUCOSE 101* 99  CALCIUM  9.4  --     Pertinent additional labs ***.  EKG: My own interpretation (not copied from electronic read) ***    Imaging Studies Performed:  Imaging Study (ie. Chest x-ray) Impression from Radiologist: ***   My Interpretation: PIERRETTE Larraine Palma, MD 07/15/2024, 7:57 PM PGY-1, [facility]  FPTS Intern pager: [phone], text pages welcome Secure chat group [facility] Teaching Service  "

## 2024-07-15 NOTE — Consult Note (Signed)
 NEUROLOGY CONSULT NOTE   Date of service: July 15, 2024 Patient Name: Luis [name] MRN:  [id number] DOB:  [birth year] Chief Complaint: Stroke Requesting Provider: McDiarmid, Krystal BIRCH, MD   ASSESSMENT   Stroke -right PCA stroke on MRI associated with right P1 occlusion on CT angiogram.  CTA also shows left V4 occlusion, bilateral severe ICA stenosis.  Etiology of the stroke may be related to atrial fibrillation on Eliquis  or due to intracranial atherosclerosis.  No TNK due to duration of symptoms.  No IR management indicated.  NIH stroke scale 2.  RECOMMENDATIONS  - Admit for workup of stroke - Echocardiogram routine - Continue Eliquis  - Lipid panel - Hemoglobin A1c -PT/OT - Neurology will follow ________________________________  History of Present Illness  Luis [name] is a 89 y.o. male with chief complaint of unsteadiness in the past few days.  Symptoms are mild, course is stable.  Patient also mentions some vague change in his vision but on closer questioning he says that he has chronic left eye vision loss and he believes this has not changed recently.  He is denying any other new focal or generalized neurological complaints.  He is compliant with Eliquis   ROS  Comprehensive ROS performed and pertinent positives documented in HPI    Past History   Past Medical History:  Diagnosis Date   Anal abscess    Atrial fibrillation (HCC)    CAD (coronary artery disease)    a. BMS-RCA, BMS-Cx, PTCA of OM1 in 2001. b. overlapping DES in RCA and PTCA to small distal Cx 2013. c. Cath 2016 -> stable CAD, suspect dyspnea unrelated to coronary disease.   COLONIC POLYPS, HX OF    [name]   DJD (degenerative joint disease)    Essential hypertension    Hyperlipidemia    Myocardial infarction (HCC) 08/1999   Persistent atrial fibrillation (HCC)    a. dx 2016.   RBBB    TIA (transient ischemic attack)     Past Surgical History:  Procedure Laterality Date   CARDIAC  CATHETERIZATION  ~ 2002   CARDIAC CATHETERIZATION N/A 03/02/2015   Procedure: Left Heart Cath and Coronary Angiography;  Surgeon: Ozell Fell, MD;  Location: [facility] INVASIVE CV LAB;  Service: Cardiovascular;  Laterality: N/A;   CATARACT EXTRACTION W/ INTRAOCULAR LENS  IMPLANT, BILATERAL  ~ 2007   bilaterally   CORONARY ANGIOPLASTY  07/30/11   CORONARY ANGIOPLASTY WITH STENT PLACEMENT  2001; 07/30/11;   2+3   FEMUR IM NAIL Left 11/12/2022   Procedure: INTRAMEDULLARY (IM) NAIL HIP FRACTURE;  Surgeon: Celena Ozell, MD;  Location: [facility] OR;  Service: Orthopedics;  Laterality: Left;   INGUINAL HERNIA REPAIR  2005   left   KNEE ARTHROSCOPY Left 02/21/2023   Procedure: LEFT KNEE ARTHROSCOPY;  Surgeon: Celena Ozell, MD;  Location: [facility] OR;  Service: Orthopedics;  Laterality: Left;   LEFT HEART CATHETERIZATION WITH CORONARY ANGIOGRAM N/A 07/30/2011   Procedure: LEFT HEART CATHETERIZATION WITH CORONARY ANGIOGRAM;  Surgeon: Lonni BIRCH Cash, MD;  Location: [facility] CATH LAB;  Service: Cardiovascular;  Laterality: N/A;   PERCUTANEOUS CORONARY STENT INTERVENTION (PCI-S)  07/30/2011   Procedure: PERCUTANEOUS CORONARY STENT INTERVENTION (PCI-S);  Surgeon: Lonni BIRCH Cash, MD;  Location: [facility] C[facility] LAB;  Service: Cardiovascular;;   RETINAL DETACHMENT SURGERY  05/2008; 06/2008; 07/2008   left; all 3 surgeries have failed   SHOULDER INJECTION Left 02/21/2023   Procedure: LEFT SHOULDER SUBACROMIAL INJECTION WITH STEROID;  Surgeon: Celena Ozell, MD;  Location: [facility];  Service:  Orthopedics;  Laterality: Left;    Family History: Family History  Problem Relation Age of Onset   Alcohol abuse Mother    Colon cancer Father        in 32s   Alcoholism Sister     Social History  reports that he quit smoking about 25 years ago. His smoking use included cigarettes. He started smoking about 65 years ago. He has a 20 pack-year smoking history. He has never used smokeless tobacco. He reports that he does not drink  alcohol and does not use drugs.  Allergies[1]  Medications  Current Medications[2]  Vitals   Vitals:   2024-08-11 2115 Aug 11, 2024 2130 11-Aug-2024 2145 08-11-24 2257  BP: (!) 188/103 (!) 168/76 (!) 171/92 (!) 144/56  Pulse:  60  87  Resp: 20 12 18  (!) 22  Temp:    98.3 F (36.8 C)  TempSrc:    Oral  SpO2: 91% 100% 100% 97%  Weight:      Height:        Body mass index is 23.32 kg/m.   Physical Exam   Constitutional: Appears well-developed and well-nourished.  Psych: Affect appropriate to situation. Eyes: No scleral injection. HENT: No OP obstruction. Head: Normocephalic. Cardiovascular: Normal rate and regular rhythm.  Respiratory: Effort normal, non-labored breathing. GI: No distension.  Skin: WDI.  Neurologic Examination   Patient is alert and oriented x 3.  Pupils are round and reactive to light in right eye and irregular, poorly reactive in the left eye.  Speech is clear.  Language is fluent.  Face is symmetric.  Extraocular movements are normal.  Visual fields are left hemianopsia and right eye to confrontation.  Tone is normal.  Strength is 5 out of 5 in all extremities.  Sensation is normal to light touch in the face and extremities.  Labs/Imaging/Neurodiagnostic studies   CBC:  Recent Labs  Lab 2024/08/11 1048 August 11, 2024 1801 11-Aug-2024 1805  WBC 5.6 6.5  --   NEUTROABS  --  4.6  --   HGB 15.3 15.4 15.0  HCT 45.4 45.7 44.0  MCV 91.7 92.1  --   PLT 209.0 212  --    Basic Metabolic Panel:  Lab Results  Component Value Date   NA 139 08/11/2024   K 4.2 08/11/2024   CO2 25 08/11/24   GLUCOSE 99 11-Aug-2024   BUN 16 08-11-2024   CREATININE 1.20 August 11, 2024   CALCIUM  9.4 08/11/24   GFRNONAA >60 08-11-2024   GFRAA 76 04/15/2020   Lipid Panel:  Lab Results  Component Value Date   LDLCALC 85 01/29/2024   HgbA1c:  Lab Results  Component Value Date   HGBA1C 5.9 01/29/2024   Urine Drug Screen:     Component Value Date/Time   LABOPIA NONE DETECTED  11/29/2014 1735   COCAINSCRNUR NONE DETECTED 11/29/2014 1735   LABBENZ NONE DETECTED 11/29/2014 1735   AMPHETMU NONE DETECTED 11/29/2014 1735   THCU NONE DETECTED 11/29/2014 1735   LABBARB NONE DETECTED 11/29/2014 1735    Alcohol Level     Component Value Date/Time   ETH <15 2024-08-11 1801   INR  Lab Results  Component Value Date   INR 1.0 08/11/2024   APTT  Lab Results  Component Value Date   APTT 30 08-11-24   AED levels: No results found for: PHENYTOIN, ZONISAMIDE, LAMOTRIGINE, LEVETIRACETA   ______________________________________    Signed, Haylee Mcanany M Darrik Richman, MD [facility]     [1] No Known Allergies [2]  Current Facility-Administered Medications:    [  START ON 07/16/2024]  stroke: early stages of recovery book, , Does not apply, Once, Diona Perkins, MD   0.9 %  sodium chloride  infusion, , Intravenous, Continuous, Diona Perkins, MD   acetaminophen  (TYLENOL ) tablet 650 mg, 650 mg, Oral, Q4H PRN **OR** acetaminophen  (TYLENOL ) 160 MG/5ML solution 650 mg, 650 mg, Per Tube, Q4H PRN **OR** acetaminophen  (TYLENOL ) suppository 650 mg, 650 mg, Rectal, Q4H PRN, Diona Perkins, MD   apixaban  (ELIQUIS ) tablet 5 mg, 5 mg, Oral, BID, Diona Perkins, MD   senna-docusate (Senokot-S) tablet 1 tablet, 1 tablet, Oral, QHS PRN, Diona Perkins, MD  Current Outpatient Medications:    amLODipine  (NORVASC ) 5 MG tablet, Take 1 tablet (5 mg total) by mouth daily., Disp: 90 tablet, Rfl: 3   apixaban  (ELIQUIS ) 5 MG TABS tablet, Take 1 tablet (5 mg total) by mouth 2 (two) times daily., Disp: 180 tablet, Rfl: 2   atorvastatin  (LIPITOR ) 80 MG tablet, Take 1 tablet (80 mg total) by mouth daily., Disp: 90 tablet, Rfl: 3   buPROPion  (WELLBUTRIN  XL) 150 MG 24 hr tablet, Take 150 mg by mouth daily., Disp: , Rfl:    feeding supplement (ENSURE ENLIVE / ENSURE PLUS) LIQD, Take 237 mLs by mouth 3 (three) times daily between meals., Disp: 237 mL, Rfl: 12   FLUoxetine  (PROZAC ) 20 MG capsule, Take  20 mg by mouth daily., Disp: , Rfl:    Glucosamine HCl (GLUCOSAMINE PO), Take 2 tablets by mouth daily., Disp: , Rfl:    nitroGLYCERIN  (NITROSTAT ) 0.4 MG SL tablet, DISSOLVE 1 TABLET UNDER TONGUE AS NEEEDED FOR CHEST PAIN, MAY REPEAT IN 5 MINUTES FOR 2 DOSES, Disp: 25 tablet, Rfl: 11   pantoprazole  (PROTONIX ) 40 MG tablet, Take 1 tablet (40 mg total) by mouth daily., Disp: 30 tablet, Rfl: 0   tamsulosin  (FLOMAX ) 0.4 MG CAPS capsule, Take 1 capsule every day by oral route., Disp: , Rfl:    vitamin E 1000 UNIT capsule, Take 1,000 Units by mouth daily., Disp: , Rfl:

## 2024-07-15 NOTE — Patient Instructions (Signed)
 It was very nice to see you today!  VISIT SUMMARY: During your visit, we discussed your persistent frontal headaches, possible confusion, visual changes, and unsteady gait following your recent dental implant surgery. We also reviewed your chronic atrial fibrillation and recent symptoms of increased fatigue and shortness of breath.  YOUR PLAN: ACUTE HEADACHE WITH CONFUSION, VISUAL CHANGES, AND GAIT DISTURBANCE: You have been experiencing persistent frontal headaches, confusion, visual changes, and unsteady gait since your dental implant surgery. -We have ordered blood work to check for anemia, infection, thyroid , kidney, liver function, and electrolytes. -An urgent MRI has been ordered to rule out any intracranial bleed or neurological issues. -Continue taking amoxicillin  as prescribed for potential infection.  ATRIAL FIBRILLATION: You have chronic atrial fibrillation and have recently experienced increased fatigue and shortness of breath. -No acute changes were noted, but we will continue to monitor your condition closely.  Return if symptoms worsen or fail to improve.   Take care, Dr Kennyth  PLEASE NOTE:  If you had any lab tests, please let us  know if you have not heard back within a few days. You may see your results on mychart before we have a chance to review them but we will give you a call once they are reviewed by us .   If we ordered any referrals today, please let us  know if you have not heard from their office within the next week.   If you had any urgent prescriptions sent in today, please check with the pharmacy within an hour of our visit to make sure the prescription was transmitted appropriately.   Please try these tips to maintain a healthy lifestyle:  Eat at least 3 REAL meals and 1-2 snacks per day.  Aim for no more than 5 hours between eating.  If you eat breakfast, please do so within one hour of getting up.   Each meal should contain half fruits/vegetables, one  quarter protein, and one quarter carbs (no bigger than a computer mouse)  Cut down on sweet beverages. This includes juice, soda, and sweet tea.   Drink at least 1 glass of water  with each meal and aim for at least 8 glasses per day  Exercise at least 150 minutes every week.

## 2024-07-15 NOTE — H&P (Incomplete)
 "    Hospital Admission History and Physical Service Pager: [phone]  Patient name: Luis [name] Medical record number: [id number] Date of Birth: March 11, 1935 Age: 89 y.o. Gender: male  Primary Care Provider: Kennyth Worth HERO, MD Consultants: Neurology Code Status: Full  Preferred Emergency Contact: Demetrion, Wesby (Spouse) [phone]  Chief Complaint: Gait changes  Differential and Medical Decision Making:  Luis [name] is a 89 y.o. male presenting with gait changes 2/2 CVA.   Etiology for CVA includes: - A fib: Known history of persistent A-fib.  Pt is on Eliquis , which he has been largely compliant with outside of a few missed doses here and there.  - CAD: Patient with known history of CAD status post multiple stents.  On high-dose statin.  Bilateral carotid siphons with significant stenosis.  Neurology on board already, appreciate assistance with patient's care.  Will proceed with stroke workup and patient education, will also optimize risk factors as possible.  Given it has been many days since symptom onset (last known normal Friday 2/6), no indication for thrombolytic therapy. Assessment & Plan CVA (cerebral vascular accident) (HCC) - Admit to FMTS, attending Dr. McDiarmid - Neurology consulting, appreciate recs - Continue Eliquis  - Echocardiogram - PT/OT - Progressive level of care, Vital signs per floor - Heart healthy diet (patient passed bedside swallow)  - PT/OT to treat - SLP eval placed - VTE prophylaxis with home Eliquis  5 mg twice daily - Labs: AM  - Fall precautions Atrial fibrillation (HCC) Known history of persistent A-fib (patient cannot tell when he is in A-fib). - Continue stroke prophylaxis with Eliquis  5 mg twice daily Chronic health problem HTN: Holding home amlodipine  5 mg daily to allow for permissive hypertension in setting of recent stroke HLD: Continue home Lipitor  80 mg daily Depression: Continue home Wellbutrin  150 mg daily and Prozac  20  mg daily Urinary frequency: Flomax  0.4 mg daily, formulary equivalent of home silodosin 4 mg daily   FEN/GI: Heart healthy VTE Prophylaxis: Eliquis  5 mg twice daily  Disposition: Progressive  History of Present Illness:  Luis [name] is a 89 y.o. male presenting with gait changes found to be 2/2 CVA. Patient accompanied by wife and daughter, who helps supplement history.  He began to feel more unstable this past Friday 2/6 and Saturday 2/7.  Also with some dizziness Sat 2/7. Notes vision changes starting Saturday in R eye, is blind in L eye. No change in speech, no facial droop. Seemed to run into door frames per wife. Had HA for last couple days. Maybe forgot to take Eliquis  over past couple days.  Saw PCP today 2/5, who noted ataxic gate. MRI was ordered, which showed acute right PCA territory infarct, so he was sent to the ED for further workup/treatment.  Had temp of 53F over the weekend. No cough, congestion, rhinorrhea. No known sick contacts. No N/V. No chest pain. Has afib, not symptomatic.   Has been participating in hip rehab for a while - sustained hip fracture from fall in summer 2024 requiring 2 month in-hospital rehab stay.  Kate has some lingering left-sided effects from this, but denies new left-sided deficits.  In the ED, CTA head and neck showed ***; CT head showed ***. Neuro was consulted.  Review Of Systems: Per HPI with the following additions: ***  Pertinent Past Medical History: - Hx TIA (reports episode 7 years ago where he could not speak) - RBBB - Persistent A fib - MI (08/1999); CAD with MI in 08/1999 s/p BMS-RCA,  BMS-Cx, PTCA of OM1 in 2001; overlapping DES in RCA and PTCA to small distal Cx 2013 Remainder reviewed in history tab.  - HTN - HLD - Depression - Prostate cancer  Pertinent Past Surgical History: - Stents: s/p BMS-RCA, BMS-Cx, PTCA of OM1 in 2001; overlapping DES in RCA and PTCA to small distal Cx 2013  - Left inguinal hernia repair  06/2003 Remainder reviewed in history tab.   Pertinent Social History: Tobacco use: Former; quit 06/1999 (previous 1/2 ppd for 40 years) Alcohol use: Former, quit 1993 Other Substance use: None Lives with wife  Pertinent Family History: Mother: alcohol use (deceased) Father: colon cancer (died in 43s) Sister: alcohol use (deceased)  Important Outpatient Medications: - Amlodipine  5 mg daily - Lipitor  80 mg daily - Eliquis  5 mg BID - maybe missed a few doses in past few days - Wellbutrin  150 mg daily - Fluoxetine  20 mg daily - Ensure TID between meals - Silodosin 4 mg daily - Vit E 1000u daily - not taking  - NTG prn  Is also taking amoxicillin  for dental infection - has 4 days left   Objective: BP (!) 144/56 (BP Location: Left Arm)   Pulse 87   Temp 98.3 F (36.8 C) (Oral)   Resp (!) 22   Ht 6' (1.829 m)   Wt 78 kg   SpO2 97%   BMI 23.32 kg/m  Exam: General: *** Eyes: *** ENTM: *** Neck: *** Cardiovascular: Irregularly irregular rate and rhythm, no tachycardia, no murmurs/rubs/gallops. Respiratory: Normal work of breathing on room air. Clear to auscultation bilaterally; no wheezes, crackles. Gastrointestinal: Bowel sounds present and normoactive bilaterally. Soft, nondistended, nontender. Derm: *** Neuro: Alert and oriented to person, place, time, event. 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 Normal sensation of bilateral upper and lower extremities.  Normal strength (5/5) of bilateral lower extremities. Right upper extremity strength 5/5; left upper extremity shoulder strength 4/5 (pt's baseline with hx torn rotator cuff). Psych: Appropriate affect.  Labs:  CBC BMET  Recent Labs  Lab 07/15/24 1801 07/15/24 1805  WBC 6.5  --   HGB 15.4 15.0  HCT 45.7 44.0  PLT 212  --    Recent Labs  Lab 07/15/24 1801 07/15/24 1805  NA 137 139  K 4.6 4.2  CL 102 105  CO2 25  --   BUN 15 16  CREATININE  1.02 1.20  GLUCOSE 101* 99  CALCIUM  9.4  --     Pertinent additional labs ***.  EKG: My own interpretation (not copied from electronic read) ***    Imaging Studies Performed:  MRI Brain 1. Acute right PCA territory infarct. Diminutive right PCA flow void, findings concerning for possible right PCA occlusion. Recommend CTA head and neck for further evaluation. 2. Abnormal appearance of the intracranial left vertebral artery flow void, which may reflect significant atherosclerosis or occlusion. 3. Chronic microvascular ischemic changes and remote infarcts as above.  CTA Head and Neck 1. Occlusion of the right PCA distal P1 segment. 2. Multifocal narrowing of the left vertebral artery V2 segment and occlusion of the left V4 segment proximal to the vertebrobasilar confluence. 3. 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 4. Approximately 50% stenosis of the proximal left ICA.  CT Head 1. Right occipital cortical infarct, corresponding to preceding MRI findings. 2. No evidence of acute intracranial hemorrhage.   Larraine Palma, MD 07/15/2024, 11:46 PM PGY-1, [facility]  FPTS Intern pager: [phone], text pages welcome Secure chat group [facility] Teaching Service  "

## 2024-07-15 NOTE — ED Provider Notes (Signed)
 " [place] [facility] Provider Note   CSN: [id number] Arrival date & time: 07/15/24  1724     Patient presents with: No chief complaint on file.   Luis [name] is a 89 y.o. male.   HPI     Presents because of concern for CVA.  Patient states that on Saturday he started noticing he was little bit unsteady.  Started walking into objects over the past couple days.  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  He may appreciate some slight vision changes of the right eye over the past couple days but this subsequently resolved.  No diplopia.  No vision field cuts that I can appreciate of the right eye.  Feels like he is just little unsteady when walking around.  No recent falls.  Patient currently taking Eliquis  in the setting of chronic A-fib.    Previous medical history reviewed :   MRI today: MPRESSION: 1. Acute right PCA territory infarct. Diminutive right PCA flow void, findings concerning for possible right PCA occlusion. Recommend CTA head and neck for further evaluation. 2. Abnormal appearance of the intracranial left vertebral artery flow void, which may reflect significant atherosclerosis or occlusion. 3. Chronic microvascular ischemic changes and remote infarcts as above. 4. Impression #1 discussed with Lonni Camp, PA at 5:48 PM on 07/15/24.   Patient went to PCP today.  Notable for unsteady gait and ataxia.  Worsened.  Ordered stat MRI outpatient.   Prior to Admission medications  Medication Sig Start Date End Date Taking? Authorizing Provider  amLODipine  (NORVASC ) 5 MG tablet Take 1 tablet (5 mg total) by mouth daily. 06/08/24   Kennyth Worth HERO, MD  apixaban  (ELIQUIS ) 5 MG TABS tablet Take 1 tablet (5 mg total) by mouth 2 (two) times daily. 11/22/23   Verlin Lonni BIRCH, MD  atorvastatin  (LIPITOR ) 80 MG tablet Take 1 tablet (80 mg total) by mouth daily. 12/06/23   Daneen Damien BROCKS, NP  buPROPion   (WELLBUTRIN  XL) 150 MG 24 hr tablet Take 150 mg by mouth daily. 05/12/22   [provider]  feeding supplement (ENSURE ENLIVE / ENSURE PLUS) LIQD Take 237 mLs by mouth 3 (three) times daily between meals. 11/16/22   Gonfa, Taye T, MD  FLUoxetine  (PROZAC ) 20 MG capsule Take 20 mg by mouth daily. 02/26/22   [provider]  Glucosamine HCl (GLUCOSAMINE PO) Take 2 tablets by mouth daily.    [provider]  nitroGLYCERIN  (NITROSTAT ) 0.4 MG SL tablet DISSOLVE 1 TABLET UNDER TONGUE AS NEEEDED FOR CHEST PAIN, MAY REPEAT IN 5 MINUTES FOR 2 DOSES 12/06/23   Verlin Lonni BIRCH, MD  pantoprazole  (PROTONIX ) 40 MG tablet Take 1 tablet (40 mg total) by mouth daily. 03/13/23   Kennyth Worth HERO, MD  tamsulosin  (FLOMAX ) 0.4 MG CAPS capsule Take 1 capsule every day by oral route.    [provider]  vitamin E 1000 UNIT capsule Take 1,000 Units by mouth daily.    [provider]    Allergies: Patient has no known allergies.    Review of Systems  Constitutional:  Negative for chills and fever.  HENT:  Negative for ear pain and sore throat.   Eyes:  Negative for pain and visual disturbance.  Respiratory:  Negative for cough and shortness of breath.   Cardiovascular:  Negative for chest pain and palpitations.  Gastrointestinal:  Negative for abdominal pain and vomiting.  Genitourinary:  Negative for dysuria and hematuria.  Musculoskeletal:  Negative for arthralgias and back pain.  Skin:  Negative for color change and rash.  Neurological:  Negative for seizures and syncope.  All other systems reviewed and are negative.   Updated Vital Signs BP (!) 167/82   Pulse 88   Temp 98.6 F (37 C) (Oral)   Resp 18   Ht 6' (1.829 m)   Wt 78 kg   SpO2 97%   BMI 23.32 kg/m   Physical Exam Vitals and nursing note reviewed.  Constitutional:      General: He is not in acute distress.    Appearance: He is well-developed.  HENT:     Head: Normocephalic and atraumatic.   Eyes:     Conjunctiva/sclera: Conjunctivae normal.  Cardiovascular:     Rate and Rhythm: Normal rate and regular rhythm.     Heart sounds: No murmur heard. Pulmonary:     Effort: Pulmonary effort is normal. No respiratory distress.     Breath sounds: Normal breath sounds.  Abdominal:     Palpations: Abdomen is soft.     Tenderness: There is no abdominal tenderness.  Musculoskeletal:        General: No swelling.     Cervical back: Neck supple.  Skin:    General: Skin is warm and dry.     Capillary Refill: Capillary refill takes less than 2 seconds.  Neurological:     Mental Status: He is alert and oriented to person, place, and time.     GCS: GCS eye subscore is 4. GCS verbal subscore is 5. GCS motor subscore is 6.     Cranial Nerves: Cranial nerves 2-12 are intact. No cranial nerve deficit or dysarthria.     Sensory: Sensation is intact.     Motor: No weakness.     Coordination: Finger-Nose-Finger Test normal.     Gait: Gait is intact.  Psychiatric:        Mood and Affect: Mood normal.     (all labs ordered are listed, but only abnormal results are displayed) Labs Reviewed  COMPREHENSIVE METABOLIC PANEL WITH GFR - Abnormal; Notable for the following components:      Result Value   Glucose, Bld 101 (*)    All other components within normal limits  I-STAT CHEM 8, ED - Abnormal; Notable for the following components:   Calcium , Ion 1.07 (*)    All other components within normal limits  CBG MONITORING, ED - Abnormal; Notable for the following components:   Glucose-Capillary 117 (*)    All other components within normal limits  PROTIME-INR  APTT  CBC  DIFFERENTIAL  ETHANOL    EKG: None  Radiology: CT ANGIO HEAD NECK W WO CM Result Date: 07/15/2024 EXAM: CTA HEAD AND NECK WITH AND WITHOUT 07/15/2024 06:56:25 PM TECHNIQUE: CTA of the head and neck was performed with and without the administration of 75 mL iohexol  (OMNIPAQUE ) 350 MG/ML injection. Multiplanar 2D and/or 3D  reformatted images are provided for review. Automated exposure control, iterative reconstruction, and/or weight based adjustment of the mA/kV was utilized to reduce the radiation dose to as low as reasonably achievable. Stenosis of the internal carotid arteries measured using NASCET criteria. COMPARISON: Brain MRI 07/15/2024. CTA head and neck 11/29/2014 CLINICAL HISTORY: Neuro deficit, acute, stroke suspected. Acute neurologic deficit; stroke suspected. FINDINGS: CTA NECK: AORTIC ARCH AND ARCH VESSELS: Calcific aortic atherosclerosis. No dissection or arterial injury. No significant stenosis of the brachiocephalic or subclavian arteries. CERVICAL CAROTID ARTERIES: Mild atherosclerotic calcification at the  right carotid bifurcation without hemodynamically significant stenosis of the right ICA. Bulky atherosclerotic calcification at the left carotid bifurcation approximately 50% stenosis of the proximal left ICA. No dissection or arterial injury. CERVICAL VERTEBRAL ARTERIES: Atherosclerotic calcification at the right vertebral artery origin, without high-grade stenosis. Multifocal narrowing of the left vertebral artery V2 segment. The left V4 segment is occluded proximal to the vertebrobasilar confluence. No dissection or arterial injury. LUNGS AND MEDIASTINUM: Bilateral apical pulmonary emphysema. SOFT TISSUES: No acute abnormality. BONES: No acute abnormality. CTA HEAD: ANTERIOR CIRCULATION: Atherosclerotic calcification of both carotid siphons with severe stenosis of the clinoid segments. No significant stenosis of the anterior cerebral arteries. No significant stenosis of the middle cerebral arteries. No aneurysm. POSTERIOR CIRCULATION: Occlusion of the right PCA distal P1 segment. No significant stenosis of the basilar artery. The intracranial segment of the left vertebral artery (V4) is occluded proximal to the vertebrobasilar confluence. The intracranial segment of the right vertebral artery (V4) is patent. No  aneurysm. OTHER: No dural venous sinus thrombosis on this non-dedicated study. IMPRESSION: 1. Occlusion of the right PCA distal P1 segment. 2. Multifocal narrowing of the left vertebral artery V2 segment and occlusion of the left V4 segment proximal to the vertebrobasilar confluence. 3. 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 4. Approximately 50% stenosis of the proximal left ICA. Findings communicated to Dr. Simon at 7:35 PM on 07/15/2024. Electronically signed by: Franky Stanford MD 07/15/2024 07:37 PM EST RP Workstation: [id number]   CT HEAD WO CONTRAST ( ) Result Date: 07/15/2024 CLINICAL DATA:  History of headache and neurologic deficit, confusion, acute infarct seen on preceding MRI exam EXAM: CT HEAD WITHOUT CONTRAST TECHNIQUE: Contiguous axial images were obtained from the base of the skull through the vertex without intravenous contrast. RADIATION DOSE REDUCTION: This exam was performed according to the departmental dose-optimization program which includes automated exposure control, adjustment of the mA and/or kV according to patient size and/or use of iterative reconstruction technique. COMPARISON:  MRI 07/15/2024 at 4:44 p.m. FINDINGS: Brain: Right occipital cortical hypodensity compatible with the acute infarct seen on preceding MRI. No evidence of hemorrhagic transformation. Chronic small vessel ischemic changes are seen elsewhere within the periventricular white matter. No evidence of acute hemorrhage. The lateral ventricles and remaining midline structures are unremarkable. No acute extra-axial fluid collections. No mass effect. Vascular: Dense atherosclerosis within the vertebral and internal carotid arteries. Please see subsequent CT angiography exam for vascular findings. Skull: Normal. Negative for fracture or focal lesion. Sinuses/Orbits: No acute finding. Postsurgical changes within the left globe. Other: None. IMPRESSION: 1. Right occipital cortical infarct,  corresponding to preceding MRI findings. 2. No evidence of acute intracranial hemorrhage. 3. Please refer to subsequent CT angiography exam for vascular findings. Electronically Signed   By: Ozell Daring M.D.   On: 07/15/2024 19:07   MR Brain Wo Contrast Result Date: 07/15/2024 EXAM: MRI BRAIN WITHOUT CONTRAST 07/15/2024 05:15:40 PM TECHNIQUE: Multiplanar multisequence MRI of the head/brain was performed without the administration of intravenous contrast. COMPARISON: None available. CLINICAL HISTORY: Headache, neuro deficit. FINDINGS: BRAIN AND VENTRICLES: There is restricted diffusion in the right occipital lobe, most pronounced in the cortex and subcortical white matter of the medial right occipital lobe. Additionally, there are areas of restricted diffusion involving the right hippocampus. There is subtle diffusion signal abnormality in the right thalamus. These findings are concerning for a right PCA territory infarct. The right PCA flow void and appears diminutive and may reflect PCA occlusion. Recommend CTA for further evaluation  of potential vascular occlusion. There is also abnormal appearance of the intracranial left vertebral artery flow void, which could reflect significant atherosclerosis versus occlusion. T2 and FLAIR hyperintensity is present in the periventricular and subcortical white matter, compatible with chronic microvascular ischemic changes. There is moderate generalized parenchymal volume loss. A remote lacunar infarct is noted in the left thalamus. Multiple prominent perivascular spaces are present in the basal ganglia. A remote infarct is seen in the left cerebellum. No intracranial hemorrhage. No mass. No midline shift. No hydrocephalus. The sella is unremarkable. ORBITS: Bilateral lens replacement. Postsurgical changes of the left globe. SINUSES AND MASTOIDS: Mild mucosal thickening in the ethmoid sinuses. BONES AND SOFT TISSUES: Normal marrow signal. No soft tissue abnormality.  IMPRESSION: 1. Acute right PCA territory infarct. Diminutive right PCA flow void, findings concerning for possible right PCA occlusion. Recommend CTA head and neck for further evaluation. 2. Abnormal appearance of the intracranial left vertebral artery flow void, which may reflect significant atherosclerosis or occlusion. 3. Chronic microvascular ischemic changes and remote infarcts as above. 4. Impression #1 discussed with Lonni Camp, PA at 5:48 PM on 07/15/24. Electronically signed by: Donnice Mania MD 07/15/2024 06:13 PM EST RP Workstation: [id number]     Procedures   Medications Ordered in the ED  sodium chloride  flush (NS) 0.9 % injection 3 mL (3 mLs Intravenous Given 07/15/24 1950)  iohexol  (OMNIPAQUE ) 350 MG/ML injection 75 mL (75 mLs Intravenous Contrast Given 07/15/24 1900)    Clinical Course as of 07/15/24 2054  Wed Jul 15, 2024  1924 Radiology: right tca occluded prox. Distal left vert  [TL]    Clinical Course User Index [TL] Simon Lavonia SAILOR, MD                   NIH Stroke Scale: 1              Medical Decision Making Risk Decision regarding hospitalization.     HPI:   Presents because of concern for CVA.  Patient states that on Saturday he started noticing he was little bit unsteady.  Started walking into objects over the past couple days.  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  He may appreciate some slight vision changes of the right eye over the past couple days but this subsequently resolved.  No diplopia.  No vision field cuts that I can appreciate of the right eye.  Feels like he is just little unsteady when walking around.  No recent falls.  Patient currently taking Eliquis  in the setting of chronic A-fib.    Previous medical history reviewed :   MRI today: MPRESSION: 1. Acute right PCA territory infarct. Diminutive right PCA flow void, findings concerning for possible right PCA occlusion. Recommend CTA head and neck for further  evaluation. 2. Abnormal appearance of the intracranial left vertebral artery flow void, which may reflect significant atherosclerosis or occlusion. 3. Chronic microvascular ischemic changes and remote infarcts as above. 4. Impression #1 discussed with Lonni Camp, PA at 5:48 PM on 07/15/24.   Patient went to PCP today.  Notable for unsteady gait and ataxia.  Worsened.  Ordered stat MRI outpatient.  MDM:   Upon examination, patient hemodynamically stable. A&O x 3 with GCS 15.  Cranials 2 through 12 intact.  Abnormal finger-nose testing to be more prominent on the left side than the right.  Ataxic unsteady gait as well.  Otherwise neurologically intact.  Strength and sensation intact in bilateral upper and lower extremities.  No dysarthria or aphasia.  No clear vision field cut.  Reviewed MRI as above.  Will obtain CTA head and neck.  Rule out any kind of large vessel occlusion.  Patient is well outside the window for any kind of intervention including thrombectomy and/or TNK.  Likely subacute CVA given that patient's symptoms started on Saturday  Reevaluation:   Upon reexamination, patient hemodynamically stable.  Remains A&O x 3 with GCS 15.  CT scan consistent for right PCA P1 segment occlusion.  Also varying degrees of stenosis throughout his vasculature.  Confirmed with neurology, and no acute intervention needed.  Will be admitted for further CVA workup.``   EKG Interpreted by Me: a fib    Cardiac Tele Interpreted by Me: a fib    I have independently interpreted the CT  images and agree with the radiologist finding   Social Determinant of Health: denies alcohol abuse    Disposition and Follow Up: admit       Final diagnoses:  Cerebrovascular accident (CVA) due to occlusion of right posterior cerebral artery [facility])    ED Discharge Orders     None          Simon Lavonia SAILOR, MD 07/15/24 2054  "

## 2024-07-15 NOTE — ED Triage Notes (Signed)
 Pt came in via POV straight from outpt MRI imaging d/t Neuro Rad seeing a concern that had him recommending pt come straight to ED for CT Head/Neck.

## 2024-07-15 NOTE — Assessment & Plan Note (Addendum)
 Blood pressure at goal today.  We recently restarted amlodipine  5 mg daily and he is doing well with this.  He will continue current regimen.

## 2024-07-15 NOTE — Progress Notes (Signed)
 "  Luis [name] is a 89 y.o. male who presents today for an office visit.  Assessment/Plan:  New/Acute Problems: Headache/confusion  He has been having intermittent headache and confusion over the last week or so after having dental implant surgery done.  He has seen his surgeon since then and they started on amoxicillin  for possible dental infection.  We did discuss that his symptoms may be related to his recent surgery and anesthesia however given his age, comorbidities and constellation of symptoms we need to pursue further workup at this point.  Neurologic exam today is notable for unsteady gait with mild ataxia - these are chronic findings but have worsened per patient and wife.  He has chronic blindness in his left eye but no other significant abnormalities.  Will check labs today as well as stat MRI.  Discussed reasons to return to care.  He will continue the amoxicillin  that was prescribed by his dental surgeon.  Chronic Problems Addressed Today: HTN (hypertension) Blood pressure at goal today.  We recently restarted amlodipine  5 mg daily and he is doing well with this.  He will continue current regimen.  Atrial fibrillation (HCC) Follows with cardiology.  Anticoagulated on Eliquis  5 mg twice daily.  We are checking labs and imaging as above.    Subjective:  HPI:  See assessment / plan for status of chronic conditions.  Patient is here today with headache and possible confusion.  Headaches have been persistent for the last several days.  He did have dental implants a week ago and wife was concerned about possible infection.  Headache predominately located in the front of his head.  Has also noticed some decreased vision and unsteady gait.  Discussed the use of AI scribe software for clinical note transcription with the patient, who gave verbal consent to proceed.  History of Present Illness Luis [name] is an 89 year old male with atrial fibrillation who presents with headache  and possible confusion.  He has been experiencing persistent frontal headaches for the last several days, which began after undergoing dental implant surgery a week ago. He was prescribed amoxicillin  due to a mild fever over the weekend and has been taking Tylenol  for pain relief.  Since the dental procedure, he has noticed decreased vision and an unsteady gait. He walks close to doorframes and bumps into tables. Although these symptoms have shown some improvement, he still feels unsteady. He has been attending rehabilitation for his unsteadiness, which has worsened recently.  He has been experiencing increased fatigue, going to bed earlier than usual, around 7:30 PM. He has a history of atrial fibrillation and experiences shortness of breath, which may contribute to his fatigue. He has not been on any new medications that could explain the tiredness.  An episode of confusion occurred where he did not respond to questions and appeared disoriented, which resolved shortly after. He has a history of a transient ischemic attack (TIA) several years ago, but no recent hospitalizations for similar events.  No significant changes in hearing, although it may have worsened slightly.        Objective:  Physical Exam: BP 132/70   Pulse 98   Temp (!) 97.3 F (36.3 C) (Temporal)   Ht 6' (1.829 m)   Wt 173 lb (78.5 kg)   SpO2 96%   BMI 23.46 kg/m   Gen: No acute distress, resting comfortably CV: Irregular rate and rhythm with no murmurs appreciated Pulm: Normal work of breathing, clear to auscultation bilaterally  with no crackles, wheezes, or rhonchi Neuro: Chronic blindness in left eye otherwise cranial nerves II through XII intact.  Ataxia noted with finger-nose-finger testing bilaterally.  Unsteady gait noted as well.  Strength 5 out of 5 in upper and lower extremities. Neuro: Grossly normal, moves all extremities Psych: Normal affect and thought content      Luis Reinertsen M. Kennyth, MD 07/15/2024  10:45 AM  "

## 2024-07-15 NOTE — Assessment & Plan Note (Signed)
 Follows with cardiology.  Anticoagulated on Eliquis  5 mg twice daily.  We are checking labs and imaging as above.

## 2024-07-15 NOTE — ED Provider Triage Note (Signed)
 Emergency Medicine Provider Triage Evaluation Note  MICHELE KERLIN , a 89 y.o. male  was evaluated in triage.  Pt complains of confusion. Per wife pt has had increased confusion, mild headache, and unsteady gait for the past week.  Recently had dental surgery and currently on amoxicillin  for possible dental infection.    Review of Systems  Positive: As above Negative: As above  Physical Exam  BP (!) 155/85 (BP Location: Right Arm)   Pulse 90   Temp 97.9 F (36.6 C) (Oral)   Resp 18   Ht 6' (1.829 m)   Wt 78 kg   SpO2 98%   BMI 23.32 kg/m  Gen:   Awake, no distress   Resp:  Normal effort  MSK:   Moves extremities without difficulty  Other:    Medical Decision Making  Medically screening exam initiated at 5:42 PM.  Appropriate orders placed.  MITSUGI SCHRADER was informed that the remainder of the evaluation will be completed by another provider, this initial triage assessment does not replace that evaluation, and the importance of remaining in the ED until their evaluation is complete.  Brain MRI showing acute stroke involving L PCA territory.  Radiologist recommend head/neck CTA for further assessment.  Pt's sxs longer than 24 hrs, outside the code stroke window.    Nivia Colon, PA-C 07/15/24 1752

## 2024-07-15 NOTE — ED Notes (Signed)
 CCMD called.

## 2024-07-16 ENCOUNTER — Inpatient Hospital Stay (HOSPITAL_COMMUNITY)

## 2024-07-16 ENCOUNTER — Ambulatory Visit: Payer: Self-pay | Admitting: Family Medicine

## 2024-07-16 DIAGNOSIS — E785 Hyperlipidemia, unspecified: Secondary | ICD-10-CM | POA: Diagnosis not present

## 2024-07-16 DIAGNOSIS — I10 Essential (primary) hypertension: Secondary | ICD-10-CM | POA: Diagnosis not present

## 2024-07-16 DIAGNOSIS — Z9889 Other specified postprocedural states: Secondary | ICD-10-CM

## 2024-07-16 DIAGNOSIS — I4891 Unspecified atrial fibrillation: Secondary | ICD-10-CM | POA: Diagnosis not present

## 2024-07-16 DIAGNOSIS — Z7901 Long term (current) use of anticoagulants: Secondary | ICD-10-CM

## 2024-07-16 DIAGNOSIS — I63531 Cerebral infarction due to unspecified occlusion or stenosis of right posterior cerebral artery: Secondary | ICD-10-CM | POA: Diagnosis not present

## 2024-07-16 DIAGNOSIS — H5440 Blindness, one eye, unspecified eye: Secondary | ICD-10-CM | POA: Diagnosis present

## 2024-07-16 DIAGNOSIS — I6389 Other cerebral infarction: Secondary | ICD-10-CM

## 2024-07-16 DIAGNOSIS — H53461 Homonymous bilateral field defects, right side: Secondary | ICD-10-CM | POA: Diagnosis present

## 2024-07-16 LAB — HEMOGLOBIN A1C
Hgb A1c MFr Bld: 5.8 % — ABNORMAL HIGH (ref 4.8–5.6)
Mean Plasma Glucose: 119.76 mg/dL

## 2024-07-16 LAB — ECHOCARDIOGRAM COMPLETE
Height: 72 in
P 1/2 time: 185 ms
S' Lateral: 3 cm
Weight: 2751.34 [oz_av]

## 2024-07-16 LAB — LIPID PANEL
Cholesterol: 171 mg/dL (ref 0–200)
HDL: 65 mg/dL
LDL Cholesterol: 88 mg/dL (ref 0–99)
Total CHOL/HDL Ratio: 2.6 ratio
Triglycerides: 89 mg/dL
VLDL: 18 mg/dL (ref 0–40)

## 2024-07-16 MED ORDER — AMOXICILLIN 500 MG PO CAPS
1000.0000 mg | ORAL_CAPSULE | Freq: Two times a day (BID) | ORAL | Status: AC
  Administered 2024-07-16 – 2024-07-17 (×5): 1000 mg via ORAL
  Filled 2024-07-16 (×6): qty 2

## 2024-07-16 MED ORDER — EZETIMIBE 10 MG PO TABS
10.0000 mg | ORAL_TABLET | Freq: Every day | ORAL | Status: AC
  Administered 2024-07-16 – 2024-07-17 (×2): 10 mg via ORAL
  Filled 2024-07-16 (×2): qty 1

## 2024-07-16 MED ORDER — ASPIRIN 81 MG PO TBEC
81.0000 mg | DELAYED_RELEASE_TABLET | Freq: Every day | ORAL | Status: AC
  Administered 2024-07-17: 81 mg via ORAL
  Filled 2024-07-16 (×2): qty 1

## 2024-07-16 MED ORDER — BUPROPION HCL ER (XL) 150 MG PO TB24
300.0000 mg | ORAL_TABLET | Freq: Every day | ORAL | Status: AC
  Administered 2024-07-16 – 2024-07-17 (×2): 300 mg via ORAL
  Filled 2024-07-16 (×2): qty 2

## 2024-07-16 MED ORDER — BUPROPION HCL ER (XL) 150 MG PO TB24
150.0000 mg | ORAL_TABLET | Freq: Every day | ORAL | Status: AC
  Administered 2024-07-16 – 2024-07-17 (×2): 150 mg via ORAL
  Filled 2024-07-16: qty 1

## 2024-07-16 MED ORDER — AMLODIPINE BESYLATE 5 MG PO TABS
5.0000 mg | ORAL_TABLET | Freq: Every day | ORAL | Status: AC
  Administered 2024-07-16 – 2024-07-17 (×2): 5 mg via ORAL
  Filled 2024-07-16 (×2): qty 1

## 2024-07-16 NOTE — Progress Notes (Signed)
 OT quick note:  OT evaluation completed. Pt needed min A for balance with maximal cues to attend to L environment. Up to mod A needed for ADLs. Little to no carryover or awareness noted of L visual field impairment. OT is recommending intensive inpatient follow up therapy, >3 hours/day after discharge. Formal evaluation note to follow.  Lucie Kendall, OTR/L Acute Rehabilitation Services Office [phone] Secure Chat Communication Preferred

## 2024-07-16 NOTE — ED Notes (Signed)
 Notified floor that pt would be coming upstairs.

## 2024-07-16 NOTE — Assessment & Plan Note (Signed)
 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 Pt has not had perfect compliance with Eliquis .  - Neurology consulting, appreciate recs - Continue Eliquis  - Echocardiogram - PT/OT - NIH strokes scale every 2 hours for 12 hours, then every 4 hours - PT/OT to treat - SLP eval placed - VTE prophylaxis with home Eliquis  5 mg twice daily - Fall precautions

## 2024-07-16 NOTE — Progress Notes (Signed)
 MRI shows acute stroke.  He is currently hospitalized for this.  We will see him for his follow-up visit once he is discharged.

## 2024-07-16 NOTE — Progress Notes (Signed)
  Inpatient Rehab Admissions Coordinator :  Per therapy recommendations, patient was screened for CIR candidacy by Ottie Glazier RN MSN.  At this time patient appears to be a potential candidate for CIR. I will place a rehab consult per protocol for full assessment. Please call me with any questions.  Ottie Glazier RN MSN Admissions Coordinator [phone]

## 2024-07-16 NOTE — Assessment & Plan Note (Addendum)
 Known history of persistent A-fib (patient cannot tell when he is in A-fib). - Continue stroke prophylaxis with Eliquis  5 mg twice daily

## 2024-07-16 NOTE — Hospital Course (Addendum)
 Luis [name] is a 89 y.o. year old with a history of Persistent A fib, CAD s/p stents, hx TIA, Htn, HLD, depression, prostate cancer who presented with gait changes and was admitted to the [facility] Medicine Teaching Service for CVA.  CVA Patient with unsteadiness, vision changes, headache, and dizziness beginning around Saturday 2/7. Saw PCP 2/4, who noted ataxic gait and ordered MRI, which showed acute right PCA territory infarct.  Patient was sent to same-day to ED for stroke workup/treatment.  In the ED, CTA head and neck showed occlusion of right PCA distal P1 segment, severe stenosis bilateral carotid siphons; CT head showed right occipital cortical infarct, no acute intracranial hemorrhage. Neuro was consulted, and recommended continuing home Eliquis , getting an echocardiogram, and having patient work with PT/OT.  Echocardiogram done 2/5 showed no evidence of interatrial shunt; LVEF 55-60%, moderate LVH, severely dilated RA, mod dilated LA, mild dilation of ascending aorta (41 mm) and aortic root (43 mm).  CVA was thought to be due to persistent A-fib with occasional missed doses of Eliquis , and compliance was emphasized during hospitalization. Neurology did start patient on ASA 81 mg daily given arthrosclerotic disease.  Other chronic conditions were medically managed with home medications and formulary alternatives as necessary (A-fib, HTN, HLD, depression, urinary frequency, history recent dental procedure).  PCP Follow-up Recommendations: Ensure follow-up with stroke clinic at [facility] in ~4 weeks Patient should not be driving given hemianopia Follow ascending aortic aneurysm Should be on Eliquis  and ASA moving forward for severe intracranial stenosis, per Neurology

## 2024-07-16 NOTE — Assessment & Plan Note (Addendum)
 HTN: Continue home amlodipine  5 mg daily (okay per neurology, Dr. Rockney) HLD: Continue home Lipitor  80 mg daily Depression: Continue home Wellbutrin  300 mg in the morning, 150 mg in the evening daily and Prozac  20 mg daily Urinary frequency: Flomax  0.4 mg daily, formulary equivalent of home silodosin 4 mg daily

## 2024-07-16 NOTE — Assessment & Plan Note (Signed)
 HTN: Continue home amlodipine  5 mg daily (okay per neurology, Dr. Rockney) HLD: Continue home Lipitor  80 mg daily, adding Zetia  10mg  daily Depression: Continue home Wellbutrin  300 mg in the morning, 150 mg in the evening daily and Prozac  20 mg daily Urinary frequency: Flomax  0.4 mg daily, formulary equivalent of home silodosin 4 mg daily

## 2024-07-16 NOTE — Progress Notes (Addendum)
 STROKE TEAM PROGRESS NOTE   SUBJECTIVE (INTERVAL HISTORY) His wife is at the bedside.  Overall his condition is stable.  Still has left hemianopia.  Moving all extremities.  Stated compliant with Eliquis  at home.   OBJECTIVE Temp:  [97.6 F (36.4 C)-98.8 F (37.1 C)] 98.1 F (36.7 C) (02/05 1842) Pulse Rate:  [52-105] 105 (02/05 1842) Cardiac Rhythm: Atrial fibrillation (02/05 0820) Resp:  [12-26] 18 (02/05 1842) BP: (142-199)/(56-103) 144/68 (02/05 1842) SpO2:  [91 %-100 %] 100 % (02/05 1842)  Recent Labs  Lab 07/15/24 1836  GLUCAP 117*   Recent Labs  Lab 07/15/24 1048 07/15/24 1801 07/15/24 1805  NA 141 137 139  K 4.6 4.6 4.2  CL 104 102 105  CO2 26 25  --   GLUCOSE 134* 101* 99  BUN 14 15 16   CREATININE 1.04 1.02 1.20  CALCIUM  9.5 9.4  --    Recent Labs  Lab 07/15/24 1048 07/15/24 1801  AST 20 27  ALT 19 23  ALKPHOS 77 95  BILITOT 0.7 0.4  PROT 6.7 6.6  ALBUMIN 4.3 4.2   Recent Labs  Lab 07/15/24 1048 07/15/24 1801 07/15/24 1805  WBC 5.6 6.5  --   NEUTROABS  --  4.6  --   HGB 15.3 15.4 15.0  HCT 45.4 45.7 44.0  MCV 91.7 92.1  --   PLT 209.0 212  --    No results for input(s): CKTOTAL, CKMB, CKMBINDEX, TROPONINI in the last 168 hours. Recent Labs    07/15/24 1801  LABPROT 14.1  INR 1.0   No results for input(s): COLORURINE, LABSPEC, PHURINE, GLUCOSEU, HGBUR, BILIRUBINUR, KETONESUR, PROTEINUR, UROBILINOGEN, NITRITE, LEUKOCYTESUR in the last 72 hours.  Invalid input(s): APPERANCEUR     Component Value Date/Time   CHOL 171 07/16/2024 0245   CHOL 116 08/03/2020 0839   TRIG 89 07/16/2024 0245   TRIG 80 05/07/2006 1036   HDL 65 07/16/2024 0245   HDL 49 08/03/2020 0839   CHOLHDL 2.6 07/16/2024 0245   VLDL 18 07/16/2024 0245   LDLCALC 88 07/16/2024 0245   LDLCALC 55 08/03/2020 0839   Lab Results  Component Value Date   HGBA1C 5.8 (H) 07/16/2024      Component Value Date/Time   LABOPIA NONE DETECTED  11/29/2014 1735   COCAINSCRNUR NONE DETECTED 11/29/2014 1735   LABBENZ NONE DETECTED 11/29/2014 1735   AMPHETMU NONE DETECTED 11/29/2014 1735   THCU NONE DETECTED 11/29/2014 1735   LABBARB NONE DETECTED 11/29/2014 1735    Recent Labs  Lab 07/15/24 1801  ETH <15    I have personally reviewed the radiological images below and agree with the radiology interpretations.  ECHOCARDIOGRAM COMPLETE Result Date: 07/16/2024    ECHOCARDIOGRAM REPORT   Patient Name:   Luis [name] Date of Exam: 07/16/2024 Medical Rec #:  [id number]        Height:       72.0 in Accession #:    [id number]       Weight:       172.0 lb Date of Birth:  [birth year]        BSA:          1.998 m Patient Age:    89 years         BP:           156/69 mmHg Patient Gender: M                HR:  64 bpm. Exam Location:  Inpatient Procedure: 2D Echo, Cardiac Doppler, Color Doppler and Saline Contrast Bubble            Study (Both Spectral and Color Flow Doppler were utilized during            procedure). Indications:    Stroke 163.9  History:        Patient has prior history of Echocardiogram examinations, most                 recent 11/12/2022. Previous Myocardial Infarction and CAD; Risk                 Factors:Hypertension and Dyslipidemia.  Sonographer:    Merlynn Argyle Referring Phys: [id number] TODD D MCDIARMID  Sonographer Comments: Suboptimal subcostal window and suboptimal parasternal window. Image acquisition challenging due to respiratory motion and Image acquisition challenging due to patient body habitus. IMPRESSIONS  1. Left ventricular ejection fraction, by estimation, is 55 to 60%. The left ventricle has normal function. The left ventricle has no regional wall motion abnormalities. There is moderate left ventricular hypertrophy. Left ventricular diastolic function  could not be evaluated.  2. Right ventricular systolic function is low normal. The right ventricular size is mildly enlarged. Tricuspid regurgitation signal is  inadequate for assessing PA pressure.  3. Left atrial size was moderately dilated.  4. Right atrial size was severely dilated.  5. The mitral valve is grossly normal. Trivial mitral valve regurgitation.  6. The aortic valve is grossly normal. Aortic valve regurgitation is mild. No aortic stenosis is present.  7. Aortic dilatation noted. There is mild dilatation of the ascending aorta, measuring 41 mm. There is mild dilatation of the aortic root, measuring 43 mm.  8. Agitated saline contrast bubble study was negative, with no evidence of any interatrial shunt. Comparison(s): Changes from prior study are noted. 11/12/2022: LVEF 60-65%. FINDINGS  Left Ventricle: Left ventricular ejection fraction, by estimation, is 55 to 60%. The left ventricle has normal function. The left ventricle has no regional wall motion abnormalities. The left ventricular internal cavity size was normal in size. There is  moderate left ventricular hypertrophy. Left ventricular diastolic function could not be evaluated due to atrial fibrillation. Left ventricular diastolic function could not be evaluated. Right Ventricle: The right ventricular size is mildly enlarged. No increase in right ventricular wall thickness. Right ventricular systolic function is low normal. Tricuspid regurgitation signal is inadequate for assessing PA pressure. Left Atrium: Left atrial size was moderately dilated. Right Atrium: Right atrial size was severely dilated. Pericardium: There is no evidence of pericardial effusion. Mitral Valve: The mitral valve is grossly normal. Trivial mitral valve regurgitation. Tricuspid Valve: The tricuspid valve is grossly normal. Tricuspid valve regurgitation is trivial. Aortic Valve: The aortic valve is grossly normal. Aortic valve regurgitation is mild. Aortic regurgitation PHT measures 185 msec. No aortic stenosis is present. Pulmonic Valve: The pulmonic valve was normal in structure. Pulmonic valve regurgitation is not visualized.  Aorta: Aortic dilatation noted. There is mild dilatation of the ascending aorta, measuring 41 mm. There is mild dilatation of the aortic root, measuring 43 mm. IAS/Shunts: No atrial level shunt detected by color flow Doppler. Agitated saline contrast was given intravenously to evaluate for intracardiac shunting. Agitated saline contrast bubble study was negative, with no evidence of any interatrial shunt.  LEFT VENTRICLE PLAX 2D LVIDd:         3.80 cm LVIDs:         3.00 cm LV PW:  1.40 cm LV IVS:        1.40 cm LVOT diam:     2.10 cm LV SV:         70 LV SV Index:   35 LVOT Area:     3.46 cm  RIGHT VENTRICLE RV Basal diam:  3.69 cm RV S prime:     11.90 cm/s TAPSE (M-mode): 1.8 cm LEFT ATRIUM             Index        RIGHT ATRIUM           Index LA diam:        3.62 cm 1.81 cm/m   RA Area:     24.80 cm LA Vol (A2C):   62.3 ml 31.18 ml/m  RA Volume:   77.20 ml  38.64 ml/m LA Vol (A4C):   75.6 ml 37.84 ml/m LA Biplane Vol: 70.1 ml 35.08 ml/m  AORTIC VALVE LVOT Vmax:   78.80 cm/s LVOT Vmean:  58.300 cm/s LVOT VTI:    0.201 m AI PHT:      185 msec  AORTA Ao Root diam: 4.30 cm Ao Asc diam:  4.11 cm TRICUSPID VALVE TR Peak grad:   26.6 mmHg TR Vmax:        258.00 cm/s  SHUNTS Systemic VTI:  0.20 m Systemic Diam: 2.10 cm Vinie Maxcy MD Electronically signed by Vinie Maxcy MD Signature Date/Time: 07/16/2024/2:50:47 PM    Final    CT ANGIO HEAD NECK W WO CM Result Date: 07/15/2024 EXAM: CTA HEAD AND NECK WITH AND WITHOUT 07/15/2024 06:56:25 PM TECHNIQUE: CTA of the head and neck was performed with and without the administration of 75 mL iohexol  (OMNIPAQUE ) 350 MG/ML injection. Multiplanar 2D and/or 3D reformatted images are provided for review. Automated exposure control, iterative reconstruction, and/or weight based adjustment of the mA/kV was utilized to reduce the radiation dose to as low as reasonably achievable. Stenosis of the internal carotid arteries measured using NASCET criteria. COMPARISON:  Brain MRI 07/15/2024. CTA head and neck 11/29/2014 CLINICAL HISTORY: Neuro deficit, acute, stroke suspected. Acute neurologic deficit; stroke suspected. FINDINGS: CTA NECK: AORTIC ARCH AND ARCH VESSELS: Calcific aortic atherosclerosis. No dissection or arterial injury. No significant stenosis of the brachiocephalic or subclavian arteries. CERVICAL CAROTID ARTERIES: Mild atherosclerotic calcification at the right carotid bifurcation without hemodynamically significant stenosis of the right ICA. Bulky atherosclerotic calcification at the left carotid bifurcation approximately 50% stenosis of the proximal left ICA. No dissection or arterial injury. CERVICAL VERTEBRAL ARTERIES: Atherosclerotic calcification at the right vertebral artery origin, without high-grade stenosis. Multifocal narrowing of the left vertebral artery V2 segment. The left V4 segment is occluded proximal to the vertebrobasilar confluence. No dissection or arterial injury. LUNGS AND MEDIASTINUM: Bilateral apical pulmonary emphysema. SOFT TISSUES: No acute abnormality. BONES: No acute abnormality. CTA HEAD: ANTERIOR CIRCULATION: Atherosclerotic calcification of both carotid siphons with severe stenosis of the clinoid segments. No significant stenosis of the anterior cerebral arteries. No significant stenosis of the middle cerebral arteries. No aneurysm. POSTERIOR CIRCULATION: Occlusion of the right PCA distal P1 segment. No significant stenosis of the basilar artery. The intracranial segment of the left vertebral artery (V4) is occluded proximal to the vertebrobasilar confluence. The intracranial segment of the right vertebral artery (V4) is patent. No aneurysm. OTHER: No dural venous sinus thrombosis on this non-dedicated study. IMPRESSION: 1. Occlusion of the right PCA distal P1 segment. 2. Multifocal narrowing of the left vertebral artery V2 segment and occlusion of the  left V4 segment proximal to the vertebrobasilar confluence. 3. Severe stenosis  of the clinoid segments of both carotid siphons. 4. Approximately 50% stenosis of the proximal left ICA. Findings communicated to Dr. Simon at 7:35 PM on 07/15/2024. Electronically signed by: Franky Stanford MD 07/15/2024 07:37 PM EST RP Workstation: [id number]   CT HEAD WO CONTRAST ( ) Result Date: 07/15/2024 CLINICAL DATA:  History of headache and neurologic deficit, confusion, acute infarct seen on preceding MRI exam EXAM: CT HEAD WITHOUT CONTRAST TECHNIQUE: Contiguous axial images were obtained from the base of the skull through the vertex without intravenous contrast. RADIATION DOSE REDUCTION: This exam was performed according to the departmental dose-optimization program which includes automated exposure control, adjustment of the mA and/or kV according to patient size and/or use of iterative reconstruction technique. COMPARISON:  MRI 07/15/2024 at 4:44 p.m. FINDINGS: Brain: Right occipital cortical hypodensity compatible with the acute infarct seen on preceding MRI. No evidence of hemorrhagic transformation. Chronic small vessel ischemic changes are seen elsewhere within the periventricular white matter. No evidence of acute hemorrhage. The lateral ventricles and remaining midline structures are unremarkable. No acute extra-axial fluid collections. No mass effect. Vascular: Dense atherosclerosis within the vertebral and internal carotid arteries. Please see subsequent CT angiography exam for vascular findings. Skull: Normal. Negative for fracture or focal lesion. Sinuses/Orbits: No acute finding. Postsurgical changes within the left globe. Other: None. IMPRESSION: 1. Right occipital cortical infarct, corresponding to preceding MRI findings. 2. No evidence of acute intracranial hemorrhage. 3. Please refer to subsequent CT angiography exam for vascular findings. Electronically Signed   By: Ozell Daring M.D.   On: 07/15/2024 19:07   MR Brain Wo Contrast Result Date: 07/15/2024 EXAM: MRI BRAIN WITHOUT CONTRAST  07/15/2024 05:15:40 PM TECHNIQUE: Multiplanar multisequence MRI of the head/brain was performed without the administration of intravenous contrast. COMPARISON: None available. CLINICAL HISTORY: Headache, neuro deficit. FINDINGS: BRAIN AND VENTRICLES: There is restricted diffusion in the right occipital lobe, most pronounced in the cortex and subcortical white matter of the medial right occipital lobe. Additionally, there are areas of restricted diffusion involving the right hippocampus. There is subtle diffusion signal abnormality in the right thalamus. These findings are concerning for a right PCA territory infarct. The right PCA flow void and appears diminutive and may reflect PCA occlusion. Recommend CTA for further evaluation of potential vascular occlusion. There is also abnormal appearance of the intracranial left vertebral artery flow void, which could reflect significant atherosclerosis versus occlusion. T2 and FLAIR hyperintensity is present in the periventricular and subcortical white matter, compatible with chronic microvascular ischemic changes. There is moderate generalized parenchymal volume loss. A remote lacunar infarct is noted in the left thalamus. Multiple prominent perivascular spaces are present in the basal ganglia. A remote infarct is seen in the left cerebellum. No intracranial hemorrhage. No mass. No midline shift. No hydrocephalus. The sella is unremarkable. ORBITS: Bilateral lens replacement. Postsurgical changes of the left globe. SINUSES AND MASTOIDS: Mild mucosal thickening in the ethmoid sinuses. BONES AND SOFT TISSUES: Normal marrow signal. No soft tissue abnormality. IMPRESSION: 1. Acute right PCA territory infarct. Diminutive right PCA flow void, findings concerning for possible right PCA occlusion. Recommend CTA head and neck for further evaluation. 2. Abnormal appearance of the intracranial left vertebral artery flow void, which may reflect significant atherosclerosis or  occlusion. 3. Chronic microvascular ischemic changes and remote infarcts as above. 4. Impression #1 discussed with Lonni Camp, PA at 5:48 PM on 07/15/24. Electronically signed by: Donnice Mania MD 07/15/2024 06:13 PM  EST RP Workstation: [id number]     PHYSICAL EXAM  Temp:  [97.6 F (36.4 C)-98.8 F (37.1 C)] 98.1 F (36.7 C) (02/05 1842) Pulse Rate:  [52-105] 105 (02/05 1842) Resp:  [12-26] 18 (02/05 1842) BP: (142-199)/(56-103) 144/68 (02/05 1842) SpO2:  [91 %-100 %] 100 % (02/05 1842)  General - Well nourished, well developed, in no apparent distress.  Ophthalmologic - fundi not visualized due to noncooperation.  Cardiovascular - irregularly irregular heart rate and rhythm.  Neuro - awake, alert, eyes open, orientated to age, place, time and people. No aphasia, fluent language, following all simple commands. Able to name and repeat and read. No gaze palsy, left eye decreased visual acuity, but able to see hand waving. R eye left hemianopia. No facial droop. Tongue midline. Bilateral UEs 5/5, no drift. Bilaterally LEs 5/5, no drift. Sensation symmetrical bilaterally, b/l FTN intact, gait not tested.    ASSESSMENT/PLAN [name] is a 89 y.o. male with history of A-fib on Eliquis , CAD/MI, hypertension, hyperlipidemia, TIA in 2016 admitted for unsteadiness. No TNK given due to outside window.    Stroke:  right PCA large infarct likely secondary to large vessel disease with right P1 occlusion, less likely but cannot rule out A-fib even on Eliquis  CT right occipital infarct CTA head and neck right P1 occlusion, left V2 stenosis and V4 occlusion, severe bilateral siphon stenosis, left ICA 50% stenosis MRI right PCA large infarct involving punctate lesion in thalamus 2D Echo EF 55 to 60%, no PFO LDL 88 HgbA1c 5.8 Eliquis  for VTE prophylaxis Eliquis  (apixaban ) daily prior to admission, now on Eliquis  (apixaban ) daily.  Recommend adding aspirin  81 on top of Eliquis  given severe  intracranial stenosis Patient counseled to be compliant with his antithrombotic medications Ongoing aggressive stroke risk factor management Therapy recommendations: CIR Disposition: Pending, pt can not drive for now due to hemianopia, pt and wife are aware. Need to follow up with ophthalmology for vision monitoring  History of TIA 11/2014 admitted for word finding difficulty.  CT head and neck showed left ICA 775% stenosis.  Carotid Doppler negative.  LDL 92, A1c 5.9.  Found to have new onset A-fib.  Discharged on Eliquis  and Lipitor  80  A-fib On home Eliquis  Per patient, compliant with medication Continue Eliquis  on discharge  Hypertension Stable On Norvasc  5  Long term BP goal normotensive  Hyperlipidemia Home meds: Lipitor  80 LDL V8, goal < 70 Now on Lipitor  80 and Zetia  10 Continue statin and Zetia  at discharge  Other Stroke Risk Factors Advanced age CAD/MI status post stenting  Other Active Problems   Hospital day # 1  Neurology will sign off. Please call with questions. Pt will follow up with stroke clinic NP at [facility] in about 4 weeks. Thanks for the consult.   Ary Cummins, MD PhD Stroke Neurology 07/16/2024 7:14 PM    To contact Stroke Continuity provider, please refer to [email or web address]. After hours, contact General Neurology

## 2024-07-16 NOTE — Progress Notes (Signed)
 Echocardiogram 2D Echocardiogram has been performed.  Luis [name] 07/16/2024, 12:12 PM

## 2024-07-16 NOTE — ED Notes (Addendum)
 Entered room due to wife coming out saying patient slid off bed. Patient found sitting on the ground. Per patient and wife patient was trying to sit on the edge of the bed to use the urinal when he slid off. Patient denies any pain, denies hitting head, and able to move all extremities without pain. [place], MD notified. VS immediately post fall are: BP 180/94; HR 79; RR 18; 100% RA.

## 2024-07-16 NOTE — Assessment & Plan Note (Addendum)
-   Admit to [facility], attending Dr. McDiarmid - Neurology consulting, appreciate recs - Continue Eliquis  - Echocardiogram - PT/OT - Progressive level of care, Vital signs per floor - NIH strokes scale every 2 hours for 12 hours, then every 4 hours - Heart healthy diet (patient passed bedside swallow)  - PT/OT to treat - SLP eval placed - VTE prophylaxis with home Eliquis  5 mg twice daily - Labs: AM A1c, lipid panel - Fall precautions

## 2024-07-16 NOTE — Progress Notes (Signed)
 "    Daily Progress Note Intern Pager: [phone]  Patient name: Luis [name] Medical record number: [id number] Date of birth: 1935/01/20 Age: 89 y.o. Gender: male  Primary Care Provider: [place] HERO, MD Consultants: Neurology Code Status: Full  Pt Overview and Major Events to Date:  1/30? - Stroke onset 2/4 - Admitted by PCP for Ataxic gate  Medical Decision Making:  Luis [name] is an 89 yo M w/ gait changes due to CVA. Etiology currently thought to be from imperfect compliance with Eliquis , workup not yet complete. Pt occasionally misses some doses and doses are not 12 hours apart.   Waiting for complete neuro workup, and PT/OT recs. Will likely need PT/OT prior to going home. Assessment & Plan CVA (cerebral vascular accident) (HCC) MRI found Acute R PCA infarct. 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 Pt has not had perfect compliance with Eliquis .  - Neurology consulting, appreciate recs - Continue Eliquis  - Echocardiogram - PT/OT - NIH strokes scale every 2 hours for 12 hours, then every 4 hours - PT/OT to treat - SLP eval placed - VTE prophylaxis with home Eliquis  5 mg twice daily - Fall precautions Atrial fibrillation (HCC) Known history of persistent A-fib (patient cannot tell when he is in A-fib). Occasionally misses doses of Eliquis . - Continue stroke prophylaxis with Eliquis  5 mg twice daily History of recent dental procedure Patient recently underwent dental procedure and was recommended to continue amoxicillin  following; started amoxicillin  875 BID Sunday 2/1.  Per patient and wife, 4 days remaining. - Amoxicillin  875 dosing not available on hospital formulary. Will continue amoxicillin  1000 mg twice daily for 4 days (final dose 2/8) Chronic health problem HTN: Continue home amlodipine  5 mg daily (okay per  neurology, Dr. Rockney) HLD: Continue home Lipitor  80 mg daily, adding Zetia  10mg  daily Depression: Continue home Wellbutrin  300 mg in the morning, 150 mg in the evening daily and Prozac  20 mg daily Urinary frequency: Flomax  0.4 mg daily, formulary equivalent of home silodosin 4 mg daily   FEN/GI: Heart Healthy PPx: Eliquis  (A.Fib) Dispo:Pending PT recommendations  pending PT recs. Barriers include had an EOB fall while in the hospital, requires eval by PT/OT. Medical workup will likely be quick. OT recommends intensive inpatient followup therapy w/ >3 hours/day.  Subjective:  Pt was eating breakfast this morning w/ wife at bedside. Denies SOB, Chest Pain, Fever, Diarrhea, Constipation. Followed commands for neuro exam (below).  Objective: Temp:  [97.3 F (36.3 C)-98.8 F (37.1 C)] 98.2 F (36.8 C) (02/05 0750) Pulse Rate:  [60-98] 97 (02/05 0700) Resp:  [12-26] 22 (02/05 0700) BP: (132-199)/(56-106) 159/102 (02/05 0700) SpO2:  [91 %-100 %] 98 % (02/05 0700) Weight:  [78 kg-78.5 kg] 78 kg (02/04 1735)  Physical Exam Vitals and nursing note reviewed. Exam conducted with a chaperone present.  Constitutional:      General: He is not in acute distress.    Appearance: He is not ill-appearing or diaphoretic.  Cardiovascular:     Rate and Rhythm: Normal rate. Rhythm irregular.  Pulmonary:     Effort: Pulmonary effort is normal. No respiratory distress.     Breath sounds: Normal breath sounds.  Abdominal:     General: There is no distension.     Palpations: Abdomen is soft.  Neurological:     Mental Status: He is alert and  oriented to person, place, and time.     Comments: L eye blind (baseline) R eye: Left visual field hemianopsia, poor vision in peripherals of R visual field. Clear vision (with glasses) in central field, can read at reading distance with current glasses. No strength dysfunctions in Upper or Lower extremities. Sensation intact. CN II-XII intact by exam Heel to  shin test intact bilaterally      Laboratory: Most recent CBC Lab Results  Component Value Date   WBC 6.5 07/15/2024   HGB 15.0 07/15/2024   HCT 44.0 07/15/2024   MCV 92.1 07/15/2024   PLT 212 07/15/2024   Most recent BMP    Latest Ref Rng & Units 07/15/2024    6:05 PM  BMP  Glucose 70 - 99 mg/dL 99   BUN 8 - 23 mg/dL 16   Creatinine 9.38 - 1.24 mg/dL 8.79   Sodium 864 - 854 mmol/L 139   Potassium 3.5 - 5.1 mmol/L 4.2   Chloride 98 - 111 mmol/L 105     Other pertinent labs: A1c 5.8 Cholesterol 171 LDL 88  Imaging/Diagnostic Tests: CT ANGIO HEAD NECK W WO CM Result Date: 07/15/2024 EXAM: CTA HEAD AND NECK WITH AND WITHOUT 07/15/2024 06:56:25 PM TECHNIQUE: CTA of the head and neck was performed with and without the administration of 75 mL iohexol  (OMNIPAQUE ) 350 MG/ML injection. Multiplanar 2D and/or 3D reformatted images are provided for review. Automated exposure control, iterative reconstruction, and/or weight based adjustment of the mA/kV was utilized to reduce the radiation dose to as low as reasonably achievable. Stenosis of the internal carotid arteries measured using NASCET criteria. COMPARISON: Brain MRI 07/15/2024. CTA head and neck 11/29/2014 CLINICAL HISTORY: Neuro deficit, acute, stroke suspected. Acute neurologic deficit; stroke suspected. FINDINGS: CTA NECK: AORTIC ARCH AND ARCH VESSELS: Calcific aortic atherosclerosis. No dissection or arterial injury. No significant stenosis of the brachiocephalic or subclavian arteries. CERVICAL CAROTID ARTERIES: Mild atherosclerotic calcification at the right carotid bifurcation without hemodynamically significant stenosis of the right ICA. Bulky atherosclerotic calcification at the left carotid bifurcation approximately 50% stenosis of the proximal left ICA. No dissection or arterial injury. CERVICAL VERTEBRAL ARTERIES: Atherosclerotic calcification at the right vertebral artery origin, without high-grade stenosis. Multifocal  narrowing of the left vertebral artery V2 segment. The left V4 segment is occluded proximal to the vertebrobasilar confluence. No dissection or arterial injury. LUNGS AND MEDIASTINUM: Bilateral apical pulmonary emphysema. SOFT TISSUES: No acute abnormality. BONES: No acute abnormality. CTA HEAD: ANTERIOR CIRCULATION: Atherosclerotic calcification of both carotid siphons with severe stenosis of the clinoid segments. No significant stenosis of the anterior cerebral arteries. No significant stenosis of the middle cerebral arteries. No aneurysm. POSTERIOR CIRCULATION: Occlusion of the right PCA distal P1 segment. No significant stenosis of the basilar artery. The intracranial segment of the left vertebral artery (V4) is occluded proximal to the vertebrobasilar confluence. The intracranial segment of the right vertebral artery (V4) is patent. No aneurysm. OTHER: No dural venous sinus thrombosis on this non-dedicated study. IMPRESSION: 1. Occlusion of the right PCA distal P1 segment. 2. Multifocal narrowing of the left vertebral artery V2 segment and occlusion of the left V4 segment proximal to the vertebrobasilar confluence. 3. 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 4. Approximately 50% stenosis of the proximal left ICA. Findings communicated to Dr. Simon at 7:35 PM on 07/15/2024. Electronically signed by: Franky Stanford MD 07/15/2024 07:37 PM EST RP Workstation: [id number]   CT HEAD WO CONTRAST ( ) Result Date: 07/15/2024 CLINICAL DATA:  History  of headache and neurologic deficit, confusion, acute infarct seen on preceding MRI exam EXAM: CT HEAD WITHOUT CONTRAST TECHNIQUE: Contiguous axial images were obtained from the base of the skull through the vertex without intravenous contrast. RADIATION DOSE REDUCTION: This exam was performed according to the departmental dose-optimization program which includes automated exposure control, adjustment of the mA and/or kV according to patient size and/or  use of iterative reconstruction technique. COMPARISON:  MRI 07/15/2024 at 4:44 p.m. FINDINGS: Brain: Right occipital cortical hypodensity compatible with the acute infarct seen on preceding MRI. No evidence of hemorrhagic transformation. Chronic small vessel ischemic changes are seen elsewhere within the periventricular white matter. No evidence of acute hemorrhage. The lateral ventricles and remaining midline structures are unremarkable. No acute extra-axial fluid collections. No mass effect. Vascular: Dense atherosclerosis within the vertebral and internal carotid arteries. Please see subsequent CT angiography exam for vascular findings. Skull: Normal. Negative for fracture or focal lesion. Sinuses/Orbits: No acute finding. Postsurgical changes within the left globe. Other: None. IMPRESSION: 1. Right occipital cortical infarct, corresponding to preceding MRI findings. 2. No evidence of acute intracranial hemorrhage. 3. Please refer to subsequent CT angiography exam for vascular findings. Electronically Signed   By: Ozell Daring M.D.   On: 07/15/2024 19:07   MR Brain Wo Contrast Result Date: 07/15/2024 EXAM: MRI BRAIN WITHOUT CONTRAST 07/15/2024 05:15:40 PM TECHNIQUE: Multiplanar multisequence MRI of the head/brain was performed without the administration of intravenous contrast. COMPARISON: None available. CLINICAL HISTORY: Headache, neuro deficit. FINDINGS: BRAIN AND VENTRICLES: There is restricted diffusion in the right occipital lobe, most pronounced in the cortex and subcortical white matter of the medial right occipital lobe. Additionally, there are areas of restricted diffusion involving the right hippocampus. There is subtle diffusion signal abnormality in the right thalamus. These findings are concerning for a right PCA territory infarct. The right PCA flow void and appears diminutive and may reflect PCA occlusion. Recommend CTA for further evaluation of potential vascular occlusion. There is also  abnormal appearance of the intracranial left vertebral artery flow void, which could reflect significant atherosclerosis versus occlusion. T2 and FLAIR hyperintensity is present in the periventricular and subcortical white matter, compatible with chronic microvascular ischemic changes. There is moderate generalized parenchymal volume loss. A remote lacunar infarct is noted in the left thalamus. Multiple prominent perivascular spaces are present in the basal ganglia. A remote infarct is seen in the left cerebellum. No intracranial hemorrhage. No mass. No midline shift. No hydrocephalus. The sella is unremarkable. ORBITS: Bilateral lens replacement. Postsurgical changes of the left globe. SINUSES AND MASTOIDS: Mild mucosal thickening in the ethmoid sinuses. BONES AND SOFT TISSUES: Normal marrow signal. No soft tissue abnormality. IMPRESSION: 1. Acute right PCA territory infarct. Diminutive right PCA flow void, findings concerning for possible right PCA occlusion. Recommend CTA head and neck for further evaluation. 2. Abnormal appearance of the intracranial left vertebral artery flow void, which may reflect significant atherosclerosis or occlusion. 3. Chronic microvascular ischemic changes and remote infarcts as above. 4. Impression #1 discussed with Lonni Camp, PA at 5:48 PM on 07/15/24. Electronically signed by: Donnice Mania MD 07/15/2024 06:13 PM EST RP Workstation: [id number]   Lera Nancyann NOVAK, DO 07/16/2024, 7:58 AM  PGY-1, [facility] FPTS Intern pager: [phone], text pages welcome Secure chat group [facility] Teaching Service   "

## 2024-07-16 NOTE — Progress Notes (Addendum)
" °   07/16/24 0715  Spiritual Encounters  Type of Visit Initial  Care provided to: Pt and family  Conversation partners present during encounter Other (comment)  Referral source Patient request  Reason for visit Religious ritual  OnCall Visit Yes   Responded to request for support. Patient's spouse has been present since yesterday morning. Concerned with diagnosis and patient slipping from bed. Spouse also requested coffee however preferred to go to coffee shop to walk a little.  Provided support to spouse.   "

## 2024-07-16 NOTE — Evaluation (Signed)
 Physical Therapy Evaluation Patient Details Name: Luis [name] MRN: [id number] DOB: Feb 27, 1935 Today'Luis Date: 07/16/2024  History of Present Illness  Luis [name] is a 89 yo male who presented with intermittent HA and confusion for 1 week.   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 CTA found Occlusion of R PCA. PMHx: a fib, CAD, DJD< HLD, MMI, RBBB, TIA  Clinical Impression  Pt admitted with/for posterior CVA with full L field cue.  Mobility is not at baseline as pt is more unsteady with addition of visual impairment, needing min physical and max awareness cues visually.  Pt currently limited functionally due to the problems listed. ( See problems list.)   Pt will benefit from PT to maximize function and safety in order to get ready for next venue listed below.  Patient will benefit from intensive inpatient follow-up therapy, >3 hours/day          If plan is discharge home, recommend the following: A little help with walking and/or transfers;A little help with bathing/dressing/bathroom;Assistance with cooking/housework;Assist for transportation;Help with stairs or ramp for entrance   Can travel by private vehicle        Equipment Recommendations  (TBD next venue)  Recommendations for Other Services  Rehab consult    Functional Status Assessment Patient has had a recent decline in their functional status and demonstrates the ability to make significant improvements in function in a reasonable and predictable amount of time.     Precautions / Restrictions Precautions Precautions: Fall Recall of Precautions/Restrictions: Impaired Restrictions Weight Bearing Restrictions Per Provider Order: No      Mobility  Bed Mobility Overal bed mobility: Needs Assistance Bed Mobility: Supine to Sit, Sit to Supine     Supine to sit: Min assist Sit to supine: Min assist        Transfers Overall transfer level: Needs assistance   Transfers: Sit to/from Stand Sit to Stand: Min assist            General transfer comment: As was the case for OT, min A to steady when standing, min A for balance when walking - max A for cues to scan and attend to L environment    Ambulation/Gait Ambulation/Gait assistance: Min assist Gait Distance (Feet): 200 Feet Assistive device: Straight cane Gait Pattern/deviations: Step-through pattern, Decreased stride length   Gait velocity interpretation: <1.8 ft/sec, indicate of risk for recurrent falls   General Gait Details: mildly unsteady overall, with mild hip drop L with narrowed steps.  All made worse by L field deficit and pt not aware enough to scan L, so brushes against OR runs into obstacles on the L.[name]  Also lose his way, due to misses hallways to the left .  Stairs            Wheelchair Mobility     Tilt Bed    Modified Rankin (Stroke Patients Only) Modified Rankin (Stroke Patients Only) Pre-Morbid Rankin Score: No significant disability Modified Rankin: Moderate disability     Balance Overall balance assessment: Needs assistance Sitting-balance support: Feet supported Sitting balance-Leahy Scale: Fair     Standing balance support: Single extremity supported, During functional activity Standing balance-Leahy Scale: Poor (to fair statically) Standing balance comment: statically fair.                             Pertinent Vitals/Pain Pain Assessment Pain Assessment: Faces Faces Pain Scale: Hurts a little bit Pain Location: generalized Pain Descriptors /  Indicators: Discomfort Pain Intervention(Luis): Monitored during session    Home Living Family/patient expects to be discharged to:: Private residence Living Arrangements: Spouse/significant other Available Help at Discharge: Family;Available 24 hours/day Type of Home: House Home Access: Stairs to enter Entrance Stairs-Rails: Right;Can reach both;Left Entrance Stairs-Number of Steps: 4 Alternate Level Stairs-Number of Steps: flight Home Layout:  Multi-level;Bed/bath upstairs (3 level - there is a room on the main level if needed) Home Equipment: Rolling Walker (2 wheels);Rollator (4 wheels);Cane - single point;Shower seat;Grab bars - toilet (stair lift on stairs for shop?)      Prior Function Prior Level of Function : Independent/Modified Independent             Mobility Comments: SPC for most mobility, denies falls. [facility] ADLs Comments: mod I, drives     Extremity/Trunk Assessment   Upper Extremity Assessment Upper Extremity Assessment: Defer to OT evaluation (generally  WFL bil)    Lower Extremity Assessment Lower Extremity Assessment: Overall WFL for tasks assessed (mild proximal weakness, but still functional)    Cervical / Trunk Assessment Cervical / Trunk Assessment: Kyphotic  Communication   Communication Communication: No apparent difficulties    Cognition Arousal: Alert Behavior During Therapy: WFL for tasks assessed/performed                             Following commands: Impaired Following commands impaired: Only follows one step commands consistently, Follows multi-step commands inconsistently, Follows multi-step commands with increased time     Cueing Cueing Techniques: Verbal cues     General Comments General comments (skin integrity, edema, etc.): vss    Exercises     Assessment/Plan    PT Assessment Patient needs continued PT services  PT Problem List Decreased activity tolerance;Decreased balance;Decreased mobility;Decreased coordination;Decreased safety awareness       PT Treatment Interventions Gait training;Stair training;Functional mobility training;Therapeutic activities;Balance training;Neuromuscular re-education;Patient/family education    PT Goals (Current goals can be found in the Care Plan section)  Acute Rehab PT Goals Patient Stated Goal: I want to go for some good therapy to get me independent PT Goal Formulation: With patient Time  For Goal Achievement: 07/30/24 Potential to Achieve Goals: Good    Frequency Min 3X/week     Co-evaluation               AM-PAC PT 6 Clicks Mobility  Outcome Measure Help needed turning from your back to your side while in a flat bed without using bedrails?: A Little Help needed moving from lying on your back to sitting on the side of a flat bed without using bedrails?: A Little Help needed moving to and from a bed to a chair (including a wheelchair)?: A Lot Help needed standing up from a chair using your arms (e.g., wheelchair or bedside chair)?: A Little Help needed to walk in hospital room?: A Lot Help needed climbing 3-5 steps with a railing? : A Lot 6 Click Score: 15    End of Session   Activity Tolerance: Patient tolerated treatment well;Patient limited by fatigue (afib leading to some fatigue) Patient left: in bed;with family/visitor present;with call bell/phone within reach Nurse Communication: Mobility status PT Visit Diagnosis: Other abnormalities of gait and mobility (R26.89);Other symptoms and signs involving the nervous system (R29.898)    Time: 1300-1336 PT Time Calculation (min) (ACUTE ONLY): 36 min   Charges:   PT Evaluation $PT Eval Moderate Complexity: 1 Mod PT Treatments $  Gait Training: 8-22 mins PT General Charges $$ ACUTE PT VISIT: 1 Visit         07/16/2024  [place] HERO., PT Acute Rehabilitation Services [phone]  (office)  Vinie GAILS Jeb Schloemer 07/16/2024, 1:49 PM

## 2024-07-16 NOTE — Assessment & Plan Note (Signed)
 Known history of persistent A-fib (patient cannot tell when he is in A-fib). Occasionally misses doses of Eliquis . - Continue stroke prophylaxis with Eliquis  5 mg twice daily

## 2024-07-16 NOTE — Assessment & Plan Note (Addendum)
 Patient recently underwent dental procedure and was recommended to continue amoxicillin  following; started amoxicillin  875 BID Sunday 2/1.  Per patient and wife, 4 days remaining. - Amoxicillin  875 dosing not available on hospital formulary. Will continue amoxicillin  1000 mg twice daily for 4 days (last dose 2/8)

## 2024-07-16 NOTE — Evaluation (Signed)
 Occupational Therapy Evaluation Patient Details Name: Luis [name] MRN: [id number] DOB: [birth year] Today's Date: 07/16/2024   History of Present Illness   Luis [name] is a 89 yo male who presented with intermittent HA and confusion for 1 week.   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 CTA found Occlusion of R PCA. PMHx: a fib, CAD, DJD< HLD, MMI, RBBB, TIA     Clinical Impressions Luis [name] was evaluated s/p the above admission list. He is mod I with SPC at baseline. Upon evaluation the pt was limited by unsteady balance, L visual field deficit, impaired cognition, L inattention, weakness and limited activity tolerance. Overall he needed min A for balance with SPC and max A for cues to avoid running into obstacles in the L environment. Pt ha a significant L drift when ambulating. Due to the deficits listed below the pt also needs mod A for LB ADLs and min A for UB ADLs. Maximal cues needed for all task to scan to the L. Pt will benefit from continued acute OT services and intensive inpatient follow up therapy, >3 hours/day after discharge.       If plan is discharge home, recommend the following:   A lot of help with walking and/or transfers;A lot of help with bathing/dressing/bathroom;Assistance with cooking/housework;Assistance with feeding;Direct supervision/assist for medications management;Direct supervision/assist for financial management;Assist for transportation;Help with stairs or ramp for entrance;Supervision due to cognitive status     Functional Status Assessment   Patient has had a recent decline in their functional status and demonstrates the ability to make significant improvements in function in a reasonable and predictable amount of time.     Equipment Recommendations   None recommended by OT     Recommendations for Other Services   Rehab consult     Precautions/Restrictions   Precautions Precautions: Fall Recall of Precautions/Restrictions:  Impaired Restrictions Weight Bearing Restrictions Per Provider Order: No     Mobility Bed Mobility Overal bed mobility: Needs Assistance Bed Mobility: Supine to Sit, Sit to Supine     Supine to sit: Min assist Sit to supine: Min assist        Transfers Overall transfer level: Needs assistance   Transfers: Sit to/from Stand Sit to Stand: Min assist           General transfer comment: min A to steady when standing, min A for balance when walking - max A for cues to scan and attend to L environment      Balance Overall balance assessment: Needs assistance Sitting-balance support: Feet supported Sitting balance-Leahy Scale: Fair     Standing balance support: Single extremity supported, During functional activity Standing balance-Leahy Scale: Poor                             ADL either performed or assessed with clinical judgement   ADL Overall ADL's : Needs assistance/impaired Eating/Feeding: Minimal assistance Eating/Feeding Details (indicate cue type and reason): cues for L side of tray Grooming: Set up;Sitting   Upper Body Bathing: Set up;Sitting   Lower Body Bathing: Moderate assistance;Sit to/from stand   Upper Body Dressing : Set up;Sitting   Lower Body Dressing: Moderate assistance;Sit to/from stand   Toilet Transfer: Moderate assistance Toilet Transfer Details (indicate cue type and reason): min A for balance, max A for cues to avoid obstacles on the L side Toileting- Clothing Manipulation and Hygiene: Minimal assistance;Sit to/from stand;Sitting/lateral lean       Functional  mobility during ADLs: Moderate assistance;Cane General ADL Comments: Pt with L drift and ran into every object     Vision Baseline Vision/History: 1 Wears glasses;2 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 Pt has an apparent L visual field deficit that starts slight to the R of midline     Perception Perception: Impaired Preception Impairment Details: Inattention/Neglect (L)     Praxis Praxis: Not tested       Pertinent Vitals/Pain Pain Assessment Pain Assessment: Faces Faces Pain Scale: Hurts a little bit Pain Location: generalized Pain Descriptors / Indicators: Discomfort Pain Intervention(s): Limited activity within patient's tolerance, Monitored during session     Extremity/Trunk Assessment Upper Extremity Assessment Upper Extremity Assessment: Generalized weakness   Lower Extremity Assessment Lower Extremity Assessment: Defer to PT evaluation   Cervical / Trunk Assessment Cervical / Trunk Assessment: Kyphotic   Communication Communication Communication: No apparent difficulties   Cognition Arousal: Alert Behavior During Therapy: WFL for tasks assessed/performed Cognition: Cognition impaired   Orientation impairments: Situation Awareness: Online awareness impaired Memory impairment (select all impairments): Working civil service fast streamer, Copywriter, advertising Attention impairment (select first level of impairment): Alternating attention Executive functioning impairment (select all impairments): Reasoning, Problem solving OT - Cognition Comments: unsure of baseline, pt needed simple cues for most activity with little carryover noted. Pt had a difficult time recalling education in regard to CVA, stroke recovery process and vision dificits                 Following commands: Impaired Following commands impaired: Only follows one step commands consistently, Follows multi-step commands inconsistently, Follows  multi-step commands with increased time     Cueing  General Comments   Cueing Techniques: Verbal cues  VSS   Exercises     Shoulder Instructions      Home Living Family/patient expects to be discharged to:: Private residence Living Arrangements: Spouse/significant other Available Help at Discharge: Family;Available 24 hours/day Type of Home: House Home Access: Stairs to enter [employer] of Steps: 4 Entrance Stairs-Rails: Right;Can reach both;Left Home Layout: Multi-level;Bed/bath upstairs (3 level - there is a room on the main level if needed) Alternate Level Stairs-Number of Steps: flight Alternate Level Stairs-Rails: Right;Left Bathroom Shower/Tub: Producer, Television/film/video: Standard     Home Equipment: Agricultural Consultant (2 wheels);Rollator (4 wheels);Cane - single point;Shower seat;Grab bars - toilet (stair lift on stairs for shop?)          Prior Functioning/Environment Prior Level of Function : Independent/Modified Independent             Mobility Comments: SPC for most mobility, denies falls. [facility] ADLs Comments: mod I, drives    OT Problem List: Decreased strength;Decreased activity tolerance;Decreased range of motion;Impaired balance (sitting and/or standing);Decreased cognition;Impaired vision/perception;Decreased safety awareness;Decreased knowledge of use of DME or AE;Decreased knowledge of precautions   OT Treatment/Interventions: Self-care/ADL training;Therapeutic exercise;DME and/or AE instruction;Therapeutic activities;Visual/perceptual remediation/compensation;Patient/family education;Balance training      OT Goals(Current goals can be found in the care plan section)   Acute Rehab OT Goals Patient Stated Goal: to go home OT Goal Formulation: With patient Time For Goal Achievement: 07/30/24 Potential to Achieve Goals:  Good ADL Goals Pt Will Perform Lower Body Dressing: with modified independence;sit  to/from stand Pt Will Transfer to Toilet: with modified independence;ambulating;regular height toilet Additional ADL Goal #1: Pt will navigate busy hospital environment with mod I and LRAD to demosntrate reduced risk for falls Additional ADL Goal #2: Pt will scan L environment to locate items with min cues   OT Frequency:  Min 2X/week    Co-evaluation              AM-PAC OT 6 Clicks Daily Activity     Outcome Measure Help from another person eating meals?: A Little Help from another person taking care of personal grooming?: A Little Help from another person toileting, which includes using toliet, bedpan, or urinal?: A Lot Help from another person bathing (including washing, rinsing, drying)?: A Lot Help from another person to put on and taking off regular upper body clothing?: A Little Help from another person to put on and taking off regular lower body clothing?: A Lot 6 Click Score: 15   End of Session Equipment Utilized During Treatment: Gait belt;Other (comment) [facility]) Nurse Communication: Mobility status  Activity Tolerance: Patient tolerated treatment well Patient left: in bed;with call bell/phone within reach;with family/visitor present  OT Visit Diagnosis: Unsteadiness on feet (R26.81);Other abnormalities of gait and mobility (R26.89);Low vision, both eyes (H54.2);Pain                Time: 9181-9146 OT Time Calculation (min): 35 min Charges:  OT General Charges $OT Visit: 1 Visit OT Evaluation $OT Eval Moderate Complexity: 1 Mod OT Treatments $Therapeutic Activity: 8-22 mins  Lucie Kendall, OTR/L Acute Rehabilitation Services Office [phone] Secure Chat Communication Preferred   Lucie JONETTA Kendall 07/16/2024, 12:14 PM

## 2024-07-16 NOTE — Assessment & Plan Note (Signed)
 Patient recently underwent dental procedure and was recommended to continue amoxicillin  following; started amoxicillin  875 BID Sunday 2/1.  Per patient and wife, 4 days remaining. - Amoxicillin  875 dosing not available on hospital formulary. Will continue amoxicillin  1000 mg twice daily for 4 days (final dose 2/8)

## 2024-07-17 MED ORDER — POLYETHYLENE GLYCOL 3350 17 G PO PACK
17.0000 g | PACK | Freq: Every day | ORAL | Status: AC
  Administered 2024-07-17: 17 g via ORAL
  Filled 2024-07-17: qty 1

## 2024-07-17 MED ORDER — SENNOSIDES-DOCUSATE SODIUM 8.6-50 MG PO TABS: 1.0000 | ORAL_TABLET | Freq: Every day | ORAL | Status: AC | PRN

## 2024-07-17 NOTE — Assessment & Plan Note (Signed)
 HTN: Continue home amlodipine  5 mg daily (okay per neurology, Dr. Rockney) HLD: Continue home Lipitor  80 mg daily, added Zetia  10mg  daily Depression: Continue home Wellbutrin  300 mg in the morning, 150 mg in the evening daily and Prozac  20 mg daily Urinary frequency: Flomax  0.4 mg daily, formulary equivalent of home silodosin 4 mg daily Blind Left Eye: Chronic

## 2024-07-17 NOTE — Assessment & Plan Note (Signed)
 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 Pt has not had perfect compliance with Eliquis .  - Continue home Eliquis  5 mg twice daily; now on ASA 81 mg daily per neuro recs - NIH strokes scale every 4 hours -> ***discontinue - PT/OT following; awaiting CIR insurance authorization - Fall precautions

## 2024-07-17 NOTE — Assessment & Plan Note (Signed)
 Known history of persistent A-fib (patient cannot tell when he is in A-fib). Occasionally misses doses of Eliquis . - Continue stroke prophylaxis with Eliquis  5 mg twice daily

## 2024-07-17 NOTE — Assessment & Plan Note (Signed)
 HTN: Continue home amlodipine  5 mg daily (okay per neurology, Dr. Rockney) HLD: Continue home Lipitor  80 mg daily, adding Zetia  10mg  daily Depression: Continue home Wellbutrin  300 mg in the morning, 150 mg in the evening daily and Prozac  20 mg daily Urinary frequency: Flomax  0.4 mg daily, formulary equivalent of home silodosin 4 mg daily Blind Left Eye: Chronic

## 2024-07-17 NOTE — Plan of Care (Signed)
 Uneventful day, no complain of pain, plan of care continued Problem: Education: Goal: Knowledge of disease or condition will improve Outcome: Progressing   Problem: Ischemic Stroke/TIA Tissue Perfusion: Goal: Complications of ischemic stroke/TIA will be minimized Outcome: Progressing   Problem: Coping: Goal: Will verbalize positive feelings about self Outcome: Progressing Goal: Will identify appropriate support needs Outcome: Progressing   Problem: Health Behavior/Discharge Planning: Goal: Ability to manage health-related needs will improve Outcome: Progressing Goal: Goals will be collaboratively established with patient/family Outcome: Progressing   Problem: Self-Care: Goal: Ability to communicate needs accurately will improve Outcome: Progressing   Problem: Nutrition: Goal: Risk of aspiration will decrease Outcome: Progressing Goal: Dietary intake will improve Outcome: Progressing   Problem: Education: Goal: Knowledge of General Education information will improve Description: Including pain rating scale, medication(s)/side effects and non-pharmacologic comfort measures Outcome: Progressing   Problem: Clinical Measurements: Goal: Respiratory complications will improve Outcome: Progressing Goal: Cardiovascular complication will be avoided Outcome: Progressing   Problem: Nutrition: Goal: Adequate nutrition will be maintained Outcome: Progressing   Problem: Elimination: Goal: Will not experience complications related to bowel motility Outcome: Progressing Goal: Will not experience complications related to urinary retention Outcome: Progressing   Problem: Skin Integrity: Goal: Risk for impaired skin integrity will decrease Outcome: Progressing

## 2024-07-17 NOTE — Progress Notes (Signed)
 Pt arrived to unit via stretcher , transferred to bed via stretcher by care RN , ER tech and charge RN. Vitals stable , tele applied and verified. Wife at bedside , room orientation completed pt nor family has any concerns or questions at this time. Call bell within reach, bed alarm set with bed in lowest position.

## 2024-07-17 NOTE — Assessment & Plan Note (Signed)
 Patient recently underwent dental procedure and was recommended to continue amoxicillin  following; started amoxicillin  875 BID Sunday 2/1. - Amoxicillin  875 dosing not available on hospital formulary. Will continue amoxicillin  1000 mg twice daily for 4 days (final dose 2/8)

## 2024-07-17 NOTE — Progress Notes (Incomplete)
 "    Daily Progress Note Intern Pager: [phone]  Patient name: Luis [name] Medical record number: [id number] Date of birth: August 02, 1934 Age: 89 y.o. Gender: male  Primary Care Provider: Kennyth Worth HERO, MD Consultants: Neurology (s/o) Code Status: FULL  Pt Overview and Major Events to Date:  1/30? - Stroke onset 2/4 - Admitted by PCP for Ataxic gate 2/6 - Medically clear for discharge, awaiting CIR placement  Medical Decision Making:  Luis [name] is a 89 y.o. male with PMH of persistent A fib, CAD s/p MI and stenting; HTN, HLD, depression, prostate cancer, RBBB, ?hx TIA. Admitted for gait changes 2/2 CVA, etiology thought to be due to imperfect compliance with Eliquis ; Neurology has educated and started pt on ASA 81 daily.  2/7: Patient remains medically clear for discharge, awaiting insurance authorization to CIR. 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 Pt has not had perfect compliance with Eliquis .  - Continue home Eliquis  5 mg twice daily; now on ASA 81 mg daily per neuro recs - NIH strokes scale every 4 hours -> ***discontinue - PT/OT following; awaiting CIR insurance authorization - Fall precautions Atrial fibrillation (HCC) Known history of persistent A-fib (patient cannot tell when he is in A-fib). Occasionally misses doses of Eliquis . - Continue stroke prophylaxis with Eliquis  5 mg twice daily History of recent dental procedure Patient recently underwent dental procedure and was recommended to continue amoxicillin  following; started amoxicillin  875 BID Sunday 2/1. - Amoxicillin  875 dosing not available on hospital formulary. Will continue amoxicillin  1000 mg twice daily for 4 days (final dose 2/8) Chronic health problem HTN:  Continue home amlodipine  5 mg daily (okay per neurology, Dr. Rockney) HLD: Continue home Lipitor  80 mg daily, added Zetia  10mg  daily Depression: Continue home Wellbutrin  300 mg in the morning, 150 mg in the evening daily and Prozac  20 mg daily Urinary frequency: Flomax  0.4 mg daily, formulary equivalent of home silodosin 4 mg daily Blind Left Eye: Chronic   FEN/GI: Heart healthy PPx: Home Eliquis  5 mg BID Dispo: CIR pending insurance authorization. Patient is medically cleared for discharge to CIR.  Subjective:  ***  Objective: Temp:  [97.6 F (36.4 C)-98.5 F (36.9 C)] 98.3 F (36.8 C) (02/06 2017) Pulse Rate:  [70-105] 76 (02/06 2017) Resp:  [16-18] 16 (02/06 2017) BP: (96-169)/(54-87) 114/54 (02/06 2017) SpO2:  [96 %-100 %] 96 % (02/06 2017) Physical Exam: General: *** Cardiovascular: *** Respiratory: *** Abdomen: *** Extremities: ***  Laboratory: Most recent CBC Lab Results  Component Value Date   WBC 6.5 07/15/2024   HGB 15.0 07/15/2024   HCT 44.0 07/15/2024   MCV 92.1 07/15/2024   PLT 212 07/15/2024   Most recent BMP    Latest Ref Rng & Units 07/15/2024    6:05 PM  BMP  Glucose 70 - 99 mg/dL 99   BUN 8 - 23 mg/dL 16   Creatinine 9.38 - 1.24 mg/dL 8.79   Sodium 864 - 854 mmol/L 139   Potassium 3.5 - 5.1 mmol/L 4.2   Chloride 98 - 111 mmol/L 105    No new labs/imaging.  Larraine Palma, MD 07/17/2024, 10:40 PM  PGY-1, [facility] Family Medicine FPTS Intern pager: [phone], text pages welcome Secure chat group [facility] Teaching  Service  "

## 2024-07-17 NOTE — PMR Pre-admission (Shared)
 PMR Admission Coordinator Pre-Admission Assessment  Patient: Luis [name] is an 89 y.o., male MRN: [id number] DOB: 08-02-1934 Height: 6' (182.9 cm) Weight: 78 kg  Insurance Information HMO: yes    PPO:      PCP:      IPA:     80/20:      OTHER:  PRIMARY: [employer] Medicare      Policy#: ***      Subscriber: *** CM Name: ***      Phone#: ***     Fax#: *** Pre-Cert#: ***      Employer: *** Benefits:  Phone #: ***     Name: *** Eff. Date: ***     Deduct: ***      Out of Pocket Max: ***      Life Max: *** CIR: ***      SNF: *** Outpatient: ***     Co-Pay: *** Home Health: ***      Co-Pay: *** DME: ***     Co-Pay: *** Providers: *** SECONDARY:       Policy#:      Phone#:   Financial Counselor:       Phone#:   The Best Boy for patients in Inpatient Rehabilitation Facilities with attached Privacy Act Statement-Health Care Records was provided and verbally reviewed with: Luis [name]  Emergency Contact Information Contact Information     Name Relation Home Work Mobile   Luis,[name] Spouse [phone]  [phone]      Other Contacts   None on File     Current Medical History  Patient Admitting Diagnosis: CVA  History of Present Illness:  Luis [name] is a 89 y.o. male with a history of atrial fibrillation on Eliquis , CAD/MI and prior TIA who was admitted to [facility] on 07/15/2024 for unsteady gait.  He was not given TNK as he was outside the window.  CT of the head showed right occipital infarct.  CTA of the head and neck was notable for right P1 occlusion and other posterior circulation stenoses.  MRI revealed large right PCA infarct including a punctate lesion in the thalamus.  Neurology recommended adding aspirin  on top of patient's Eliquis .  They feel that stroke was likely due to large vessel disease.  Patient's atrial fibrillation has been rate controlled.  Blood pressure has been elevated.  Neurology added Zetia  to patient's  Lipitor  he was on at baseline. Pt. Seen by PT/OT and they recommend CIR to assist return to PLOF.  Complete NIHSS TOTAL: 2  Patient's medical record from [facility]  has been reviewed by the rehabilitation admission coordinator and physician.  Past Medical History  Past Medical History:  Diagnosis Date   Anal abscess    Atrial fibrillation (HCC)    CAD (coronary artery disease)    a. BMS-RCA, BMS-Cx, PTCA of OM1 in 2001. b. overlapping DES in RCA and PTCA to small distal Cx 2013. c. Cath 2016 -> stable CAD, suspect dyspnea unrelated to coronary disease.   COLONIC POLYPS, HX OF    [name]   DJD (degenerative joint disease)    Essential hypertension    Hyperlipidemia    Myocardial infarction (HCC) 08/1999   Persistent atrial fibrillation (HCC)    a. dx 2016.   RBBB    TIA (transient ischemic attack)     Has the patient had major surgery during 100 days prior to admission? No  Luis [name] History   Luis [name] history includes Alcohol abuse in his  mother; Alcoholism in his sister; Colon cancer in his father.  Current Medications Current Medications[1]  Patients Current Diet:  Diet Order             Diet Heart Room service appropriate? Yes; Fluid consistency: Thin  Diet effective now                   Precautions / Restrictions Precautions Precautions: Fall Restrictions Weight Bearing Restrictions Per Provider Order: No   Has the patient had 2 or more falls or a fall with injury in the past year? No  Prior Activity Level Community (5-7x/wk): Pt. active inthe community PTA  Prior Functional Level Self Care: Did the patient need help bathing, dressing, using the toilet or eating? Independent  Indoor Mobility: Did the patient need assistance with walking from room to room (with or without device)? Independent  Stairs: Did the patient need assistance with internal or external stairs (with or without device)? Independent  Functional Cognition: Did the  patient need help planning regular tasks such as shopping or remembering to take medications? Independent  Patient Information Are you of Hispanic, Latino/a,or Spanish origin?: A. No, not of Hispanic, Latino/a, or Spanish origin What is your race?: A. White Do you need or want an interpreter to communicate with a doctor or health care staff?: 0. No  Patient's Response To:  Health Literacy and Transportation Is the patient able to respond to health literacy and transportation needs?: Yes Health Literacy - How often do you need to have someone help you when you read instructions, pamphlets, or other written material from your doctor or pharmacy?: Never In the past 12 months, has lack of transportation kept you from medical appointments or from getting medications?: No In the past 12 months, has lack of transportation kept you from meetings, work, or from getting things needed for daily living?: No  Home Assistive Devices / Equipment Home Equipment: Agricultural Consultant (2 wheels), Rollator (4 wheels), [employer] - single point, Morgan stanley, Grab bars - toilet (stair lift on stairs for shop?)  Prior Device Use: Indicate devices/aids used by the patient prior to current illness, exacerbation or injury? SPC  Current Functional Level Cognition  Overall Cognitive Status: Within Functional Limits for tasks assessed (further higher level assessment may be beneficial) Orientation Level: Oriented X4 Attention: Focused, Sustained Focused Attention: Appears intact Sustained Attention: Appears intact Memory: Appears intact Awareness: Appears intact Problem Solving: Appears intact Executive Function: Reasoning, Organizing, Initiating Reasoning: Appears intact Organizing: Appears intact Initiating: Appears intact Safety/Judgment: Appears intact    Extremity Assessment (includes Sensation/Coordination)  Upper Extremity Assessment: Defer to OT evaluation (generally  WFL bil)  Lower Extremity Assessment:  Overall WFL for tasks assessed (mild proximal weakness, but still functional)    ADLs  Overall ADL's : Needs assistance/impaired Eating/Feeding: Minimal assistance Eating/Feeding Details (indicate cue type and reason): cues for L side of tray Grooming: Set up, Sitting Upper Body Bathing: Set up, Sitting Lower Body Bathing: Moderate assistance, Sit to/from stand Upper Body Dressing : Set up, Sitting Lower Body Dressing: Moderate assistance, Sit to/from stand Toilet Transfer: Moderate assistance Toilet Transfer Details (indicate cue type and reason): min A for balance, max A for cues to avoid obstacles on the L side Toileting- Clothing Manipulation and Hygiene: Minimal assistance, Sit to/from stand, Sitting/lateral lean Functional mobility during ADLs: Moderate assistance, Cane General ADL Comments: Pt with L drift and ran into every object    Mobility  Overal bed mobility: Needs Assistance Bed Mobility: Supine to  Sit, Sit to Supine Supine to sit: Min assist Sit to supine: Min assist    Transfers  Overall transfer level: Needs assistance Transfers: Sit to/from Stand Sit to Stand: Min assist General transfer comment: As was the case for OT, min A to steady when standing, min A for balance when walking - max A for cues to scan and attend to L environment    Ambulation / Gait / Stairs / Wheelchair Mobility  Ambulation/Gait Ambulation/Gait assistance: Editor, Commissioning (Feet): 200 Feet Assistive device: Straight cane Gait Pattern/deviations: Step-through pattern, Decreased stride length General Gait Details: mildly unsteady overall, with mild hip drop L with narrowed steps.  All made worse by L field deficit and pt not aware enough to scan L, so brushes against OR runs into obstacles on the L.[name]  Also lose his way, due to misses hallways to the left . Gait velocity interpretation: <1.8 ft/sec, indicate of risk for recurrent falls    Posture / Balance Balance Overall balance  assessment: Needs assistance Sitting-balance support: Feet supported Sitting balance-Leahy Scale: Fair Standing balance support: Single extremity supported, During functional activity Standing balance-Leahy Scale: Poor (to fair statically) Standing balance comment: statically fair.    Special considerations/life events  Special service needs none   Previous Home Environment (from acute therapy documentation) Living Arrangements: Spouse/significant other  Lives With: Spouse Available Help at Discharge: Luis [name], Available 24 hours/day Type of Home: House Home Layout: Multi-level, Bed/bath upstairs (3 level - there is a room on the main level if needed) Alternate Level Stairs-Rails: Right, Left Alternate Level Stairs-Number of Steps: flight Home Access: Stairs to enter Entrance Stairs-Rails: Right, Can reach both, Left Entrance Stairs-Number of Steps: 4 Bathroom Shower/Tub: Health Visitor: Standard Home Care Services: No  Discharge Living Setting Plans for Discharge Living Setting: House Type of Home at Discharge: House Discharge Home Layout: Multi-level, Bed/bath upstairs Alternate Level Stairs-Rails: Right, Left Alternate Level Stairs-Number of Steps: flight Discharge Home Access: Stairs to enter Entrance Stairs-Number of Steps: 4 Discharge Bathroom Shower/Tub: Walk-in shower Discharge Bathroom Toilet: Standard Discharge Bathroom Accessibility: Yes How Accessible: Accessible via walker, Accessible via wheelchair Does the patient have any problems obtaining your medications?: No  Social/Luis [name]/Support Systems Patient Roles: Spouse Contact Information: [phone] Anticipated Caregiver: Devere Caregiver Availability: 24/7 Discharge Plan Discussed with Primary Caregiver: Yes Is Caregiver In Agreement with Plan?: Yes Does Caregiver/Luis [name] have Issues with Lodging/Transportation while Pt is in Rehab?: No  Goals Patient/Luis [name] Goal for Rehab: PT/OT supervision  to mod I Expected length of stay: 7-10 days Pt/Luis [name] Agrees to Admission and willing to participate: Yes Program Orientation Provided & Reviewed with Pt/Caregiver Including Roles  & Responsibilities: Yes  Decrease burden of Care through IP rehab admission: Not anticipated  Possible need for SNF placement upon discharge: Not anticipated  Patient Condition: I have reviewed medical records from [facility] , spoken with CM, and patient and spouse. I met with patient at the bedside for inpatient rehabilitation assessment.  Patient will benefit from ongoing PT and OT, can actively participate in 3 hours of therapy a day 5 days of the week, and can make measurable gains during the admission.  Patient will also benefit from the coordinated team approach during an Inpatient Acute Rehabilitation admission.  The patient will receive intensive therapy as well as Rehabilitation physician, nursing, social worker, and care management interventions.  Due to safety, skin/wound care, disease management, medication administration, pain management, and patient education the patient requires 24 hour a day  rehabilitation nursing.  The patient is currently Min A  with mobility and basic ADLs.  Discharge setting and therapy post discharge at home with home health is anticipated.  Patient has agreed to participate in the Acute Inpatient Rehabilitation Program and will admit today.  Preadmission Screen Completed By:  Leita KATHEE Kleine, 07/17/2024 2:48 PM ______________________________________________________________________   Discussed status with on *** at *** and received approval for admission today.  Admission Coordinator:  Leita KATHEE Kleine, CCC-SLP, time PIERRETTEPattricia ***   Assessment/Plan: Diagnosis: *** Does the need for close, 24 hr/day Medical supervision in concert with the patient's rehab needs make it unreasonable for this patient to be served in a less intensive setting?  {yes_no_potentially:3041433} Co-Morbidities requiring supervision/potential complications: *** Due to {due un:6958565}, does the patient require 24 hr/day rehab nursing? {yes_no_potentially:3041433} Does the patient require coordinated care of a physician, rehab nurse, PT, OT, and SLP to address physical and functional deficits in the context of the above medical diagnosis(es)? {yes_no_potentially:3041433} Addressing deficits in the following areas: {deficits:3041436} Can the patient actively participate in an intensive therapy program of at least 3 hrs of therapy 5 days a week? {yes_no_potentially:3041433} The potential for patient to make measurable gains while on inpatient rehab is {potential:3041437} Anticipated functional outcomes upon discharge from inpatient rehab: {functional outcomes:304600100} PT, {functional outcomes:304600100} OT, {functional outcomes:304600100} SLP Estimated rehab length of stay to reach the above functional goals is: *** Anticipated discharge destination: {anticipated dc setting:21604} 10. Overall Rehab/Functional Prognosis: {potential:3041437}   MD Signature: ***    [1]  Current Facility-Administered Medications:    acetaminophen  (TYLENOL ) tablet 650 mg, 650 mg, Oral, Q4H PRN **OR** [DISCONTINUED] acetaminophen  (TYLENOL ) 160 MG/5ML solution 650 mg, 650 mg, Per Tube, Q4H PRN **OR** [DISCONTINUED] acetaminophen  (TYLENOL ) suppository 650 mg, 650 mg, Rectal, Q4H PRN, Diona Perkins, MD   amLODipine  (NORVASC ) tablet 5 mg, 5 mg, Oral, Daily, Nemecek, Amanda, MD, 5 mg at 07/17/24 9089   amoxicillin  (AMOXIL ) capsule 1,000 mg, 1,000 mg, Oral, BID, Nemecek, Alan, MD, 1,000 mg at 07/17/24 9089   apixaban  (ELIQUIS ) tablet 5 mg, 5 mg, Oral, BID, Diona Perkins, MD, 5 mg at 07/17/24 0910   aspirin  EC tablet 81 mg, 81 mg, Oral, Daily, Jerri Pfeiffer, MD, 81 mg at 07/17/24 0910   atorvastatin  (LIPITOR ) tablet 80 mg, 80 mg, Oral, Daily, Nemecek, Alan, MD, 80 mg at 07/17/24 9089    buPROPion  (WELLBUTRIN  XL) 24 hr tablet 150 mg, 150 mg, Oral, QAC supper, Nemecek, Alan, MD, 150 mg at 07/16/24 1756   buPROPion  (WELLBUTRIN  XL) 24 hr tablet 300 mg, 300 mg, Oral, Q breakfast, Nemecek, Amanda, MD, 300 mg at 07/17/24 0910   ezetimibe  (ZETIA ) tablet 10 mg, 10 mg, Oral, Daily, Diona Perkins, MD, 10 mg at 07/17/24 9089   FLUoxetine  (PROZAC ) capsule 20 mg, 20 mg, Oral, Daily, Nemecek, Amanda, MD, 20 mg at 07/17/24 0910   polyethylene glycol (MIRALAX  / GLYCOLAX ) packet 17 g, 17 g, Oral, Daily, Hindel, Leah, MD, 17 g at 07/17/24 1043   senna-docusate (Senokot-S) tablet 1 tablet, 1 tablet, Oral, Daily PRN, Hindel, Leah, MD   tamsulosin  (FLOMAX ) capsule 0.4 mg, 0.4 mg, Oral, Daily, Nemecek, Amanda, MD, 0.4 mg at 07/17/24 [id number]

## 2024-07-17 NOTE — Evaluation (Signed)
 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   Cerebral infarct (HCC) 07/15/2024   Stroke (HCC) 07/15/2024   Chronic health problem 07/15/2024   Forgetfulness 01/29/2024   Prostate cancer (HCC) 05/25/2021   Major depression, recurrent, chronic 02/19/2019   Mild carotid artery disease 02/19/2019   Insomnia 02/18/2018   Dizziness 11/27/2017   Unsteady gait 10/14/2017   TIA (transient ischemic attack) 11/29/2014   Atrial fibrillation (HCC) 11/29/2014   HTN (hypertension) 11/29/2014   HLD (hyperlipidemia) 10/27/2007   Coronary atherosclerosis 10/27/2007   History of colonic polyps 10/27/2007   Past Medical History:  Past Medical History:  Diagnosis Date   Anal abscess    Atrial fibrillation (HCC)    CAD (coronary artery disease)    a. BMS-RCA, BMS-Cx, PTCA of OM1 in 2001. b. overlapping DES in RCA and PTCA to small distal Cx 2013. c. Cath 2016 -> stable CAD, suspect dyspnea unrelated to coronary disease.   COLONIC POLYPS, HX OF    [name]   DJD (degenerative joint disease)    Essential hypertension    Hyperlipidemia    Myocardial infarction (HCC) 08/1999   Persistent atrial fibrillation (HCC)    a. dx 2016.   RBBB    TIA (transient ischemic attack)    Past Surgical History:  Past Surgical History:  Procedure Laterality Date   CARDIAC CATHETERIZATION  ~ 2002   CARDIAC CATHETERIZATION N/A 03/02/2015   Procedure: Left Heart Cath and Coronary Angiography;  Surgeon: Ozell Fell, MD;  Location: [facility] INVASIVE CV LAB;  Service: Cardiovascular;  Laterality: N/A;   CATARACT EXTRACTION W/ INTRAOCULAR LENS  IMPLANT, BILATERAL  ~ 2007   bilaterally    CORONARY ANGIOPLASTY  07/30/11   CORONARY ANGIOPLASTY WITH STENT PLACEMENT  2001; 07/30/11;   2+3   FEMUR IM NAIL Left 11/12/2022   Procedure: INTRAMEDULLARY (IM) NAIL HIP FRACTURE;  Surgeon: Celena Ozell, MD;  Location: [facility] OR;  Service: Orthopedics;  Laterality: Left;   INGUINAL HERNIA REPAIR  2005   left   KNEE ARTHROSCOPY Left 02/21/2023   Procedure: LEFT KNEE ARTHROSCOPY;  Surgeon: Celena Ozell, MD;  Location[facility] OR;  Service: Orthopedics;  Laterality: Left;   LEFT HEART CATHETERIZATION WITH CORONARY ANGIOGRAM N/A 07/30/2011   Procedure: LEFT HEART CATHETERIZATION WITH CORONARY ANGIOGRAM;  Surgeon: Lonni JONETTA Cash, MD;  Location: [facility] CATH LAB;  Service: Cardiovascular;  Laterality: N/A;   PERCUTANEOUS CORONARY STENT INTERVENTION (PCI-S)  07/30/2011   Procedure: PERCUTANEOUS CORONARY STENT INTERVENTION (PCI-S);  Surgeon: Lonni JONETTA Cash, MD;  Location: [facility] CATH LAB;  Service: Cardiovascular;;   RETINAL DETACHMENT SURGERY  05/2008; 06/2008; 07/2008   left; all 3 surgeries have failed   SHOULDER INJECTION Left 02/21/2023   Procedure: LEFT SHOULDER SUBACROMIAL INJECTION WITH STEROID;  Surgeon: Celena Ozell, MD;  Location: [facility] OR;  Service: Orthopedics;  Laterality: Left;   HPI:  Mr. Cartmell is an 89 yo male admitted on 07/15/24 with changes in his gait and vision. Outside window for TNK. Brain MRI revealed acute right PCA territory infarct. Diminutive right PCA flow void, findings  concerning for possible right PCA occlusion. PmHx significant for A-fib on Eliquis , CAD/MI, HTN, hyperlipidemia, TIA in 2016. SLP consulted for  cognitive-linguistic assessment.   Assessment / Plan / Recommendation Clinical Impression   Pt presents with grossly function communication and cognitive capabilities per informal speech/language/cognitive assessment completed today; however, he may benefit from a higher level, formal cognitive-linguistic assessment at next level of care. Conversational speech  100% fluent and intelligible with no dysarthria or anomia present. Expressive and receptive language observed to be WNL. Pt Ox4 and able to complete moderate-level problem solving question accurately.   No acute SLP needs identified at this time. SLP will sign off.   Consider higher level cognitive-linguistic assessment at next level of care to rule out cognitive deficits post stroke.     SLP Assessment  SLP Recommendation/Assessment: All further Speech Language Pathology needs can be addressed in the next venue of care     Assistance Recommended at Discharge   (defer to PT/OT)  Functional Status Assessment Patient has not had a recent decline in their functional status (re: communication/cognition)  Frequency and Duration           SLP Evaluation Cognition  Overall Cognitive Status: Within Functional Limits for tasks assessed (further higher level assessment may be beneficial) Orientation Level: Oriented X4 Year: 2026 Month: February Day of Week: Correct Attention: Focused;Sustained Focused Attention: Appears intact Sustained Attention: Appears intact Memory: Appears intact Awareness: Appears intact Problem Solving: Appears intact Executive Function: Reasoning;Organizing;Initiating Reasoning: Appears intact Organizing: Appears intact Initiating: Appears intact Safety/Judgment: Appears intact       Comprehension  Auditory Comprehension Overall Auditory Comprehension: Appears within functional limits for tasks assessed Yes/No Questions: Within Functional Limits Commands: Within Functional Limits Conversation: Complex Visual Recognition/Discrimination Discrimination: Not tested Reading Comprehension Reading Status: Not tested    Expression Expression Primary Mode of Expression: Verbal Verbal Expression Overall Verbal Expression: Appears within functional limits for tasks assessed Initiation: No impairment Automatic Speech: Name;Social Response;Day of week Level of  Generative/Spontaneous Verbalization: Conversation Repetition: No impairment Pragmatics: No impairment   Oral / Motor  Oral Motor/Sensory Function Overall Oral Motor/Sensory Function: Within functional limits Motor Speech Overall Motor Speech: Appears within functional limits for tasks assessed Respiration: Within functional limits Phonation: Normal Resonance: Within functional limits Articulation: Within functional limitis Intelligibility: Intelligible Motor Planning: Within functional limits Motor Speech Errors: Not applicable            Peyton JINNY Rummer 07/17/2024, 12:28 PM

## 2024-07-17 NOTE — TOC Initial Note (Signed)
 Transition of Care [facility]) - Initial/Assessment Note    Patient Details  Name: GEOFF DACANAY MRN: [id number] Date of Birth: [birth year]  [facility]) CM/SW Contact:    Andrez JULIANNA George, RN Phone Number: 07/17/2024, 12:09 PM  Clinical Narrative:                 RACHEL SAMPLES is a 89 y.o. male presenting with gait changes 2/2 CVA.   Pt is from home with his spouse. They are together most of the time.  Spouse can provide transportation.  Pt manages his own medications and may have missed a couple doses of his Eliquis .   Current recommendations are for CIR. CIR to start insurance auth.  ICM following.  Expected Discharge Plan: IP Rehab Facility Barriers to Discharge: Continued Medical Work up   Patient Goals and CMS Choice   CMS Medicare.gov Compare Post Acute Care list provided to:: Patient Choice offered to / list presented to : Patient, Spouse      Expected Discharge Plan and Services   Discharge Planning Services: CM Consult Post Acute Care Choice: IP Rehab Living arrangements for the past 2 months: Single Family Home                                      Prior Living Arrangements/Services Living arrangements for the past 2 months: Single Family Home Lives with:: Spouse Patient language and need for interpreter reviewed:: Yes Do you feel safe going back to the place where you live?: Yes        Care giver support system in place?: Yes (comment) Current home services: DME (cane/ wc/ 4 walkers/ shower seat) Criminal Activity/Legal Involvement Pertinent to Current Situation/Hospitalization: No - Comment as needed  Activities of Daily Living   ADL Screening (condition at time of admission) Independently performs ADLs?: Yes (appropriate for developmental age) Is the patient deaf or have difficulty hearing?: No Does the patient have difficulty seeing, even when wearing glasses/contacts?: No Does the patient have difficulty concentrating,  remembering, or making decisions?: No  Permission Sought/Granted                  Emotional Assessment Appearance:: Appears stated age Attitude/Demeanor/Rapport: Engaged Affect (typically observed): Accepting Orientation: : Oriented to Self, Oriented to Place, Oriented to  Time, Oriented to Situation   Psych Involvement: No (comment)  Admission diagnosis:  CVA (cerebral vascular accident) (HCC) [I63.9] Stroke [facility]) [I63.9] Cerebrovascular accident (CVA) due to occlusion of right posterior cerebral artery (HCC) [I63.531] Patient Active Problem List   Diagnosis Date Noted   History of recent dental procedure 07/16/2024   Hemianopia of right eye 07/16/2024   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   Cerebral infarct (HCC) 07/15/2024   Stroke (HCC) 07/15/2024   Chronic health problem 07/15/2024   Forgetfulness 01/29/2024   Prostate cancer (HCC) 05/25/2021   Major depression, recurrent, chronic 02/19/2019   Mild carotid artery disease 02/19/2019   Insomnia 02/18/2018   Dizziness 11/27/2017   Unsteady gait 10/14/2017   TIA (transient ischemic attack) 11/29/2014   Atrial fibrillation (HCC) 11/29/2014   HTN (hypertension) 11/29/2014   HLD (hyperlipidemia) 10/27/2007   Coronary atherosclerosis 10/27/2007   History of colonic polyps 10/27/2007   PCP:  Kennyth Worth HERO, MD Pharmacy:   [facility] - [place], [place] - [address] [place] [address] Phone: [phone] Fax:  [phone]  [facility] [address] [place] [address] Phone: [phone] Fax: [phone]     Social Drivers of Health (SDOH) Social History: SDOH Screenings   Food Insecurity: No Food Insecurity (07/16/2024)  Housing: Low Risk (07/16/2024)  Transportation Needs: No Transportation Needs (07/16/2024)  Utilities: Not At Risk (07/16/2024)  Depression (PHQ2-9): Medium Risk (06/08/2024)  Financial Resource Strain: Low Risk (04/05/2022)   Physical Activity: Sufficiently Active (04/05/2022)  Social Connections: Unknown (07/16/2024)  Stress: No Stress Concern Present (04/05/2022)  Tobacco Use: Medium Risk (07/15/2024)   SDOH Interventions:     Readmission Risk Interventions     No data to display

## 2024-07-17 NOTE — Progress Notes (Signed)
 "    Daily Progress Note Intern Pager: [phone]  Patient name: Luis [name] Medical record number: [id number] Date of birth: [birth year] Age: 89 y.o. Gender: male  Primary Care Provider: Kennyth Worth HERO, MD Consultants: Neurology Code Status: Full  Pt Overview and Major Events to Date:  1/30? - Stroke onset 2/4 - Admitted by PCP for Ataxic gate  Medical Decision Making:  Luis [name] is an 89 yo M w/ gait changes due to CVA. Etiology currently thought to be from imperfect compliance with Eliquis . Pt occasionally misses some doses and doses are not 12 hours apart. Neurology provided education and added ASA 81 daily before signing off.  Pt is medically clear for discharge, but awaiting placement to CIR which is awaiting insurance authorization. 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 Pt has not had perfect compliance with Eliquis .  - Neurology consulted, signed off, appreciate recs - Continue Eliquis  - Echocardiogram: EF 55-60%, no PFO. - PT/OT: Recommend CIR - NIH strokes scale every 2 hours for 12 hours, then every 4 hours - PT/OT to treat - SLP eval placed - VTE prophylaxis with home Eliquis  5 mg twice daily - Fall precautions Atrial fibrillation (HCC) Known history of persistent A-fib (patient cannot tell when he is in A-fib). Occasionally misses doses of Eliquis . - Continue stroke prophylaxis with Eliquis  5 mg twice daily History of recent dental procedure Patient recently underwent dental procedure and was recommended to continue amoxicillin  following; started amoxicillin  875 BID Sunday 2/1.  Per patient and wife, 4 days remaining. - Amoxicillin  875 dosing not available on hospital formulary. Will continue amoxicillin  1000 mg twice  daily for 4 days (final dose 2/8) Chronic health problem HTN: Continue home amlodipine  5 mg daily (okay per neurology, Dr. Rockney) HLD: Continue home Lipitor  80 mg daily, adding Zetia  10mg  daily Depression: Continue home Wellbutrin  300 mg in the morning, 150 mg in the evening daily and Prozac  20 mg daily Urinary frequency: Flomax  0.4 mg daily, formulary equivalent of home silodosin 4 mg daily Blind Left Eye: Chronic   FEN/GI: Heart Healthy PPx: Eliquis  (A.Fib) Dispo:CIR pending insurance authorization. Barriers include insurance authorization.  Subjective:  Pt was feeling well this morning, and reported having some improvement in his eye that has been blind for several years. Otherwise, reports not changes and is waiting for insurance authorization for CIR.  Objective: Temp:  [97.6 F (36.4 C)-98.6 F (37 C)] 98.4 F (36.9 C) (02/06 1219) Pulse Rate:  [57-105] 70 (02/06 1219) Resp:  [12-29] 16 (02/06 1219) BP: (115-169)/(66-87) 115/66 (02/06 1219) SpO2:  [96 %-100 %] 97 % (02/06 1219)  Physical Exam Vitals and nursing note reviewed. Exam conducted with a chaperone present.  Constitutional:      General: He is not in acute distress.    Appearance: He is not ill-appearing or diaphoretic.  Cardiovascular:     Rate and Rhythm: Normal rate. Rhythm irregular.  Pulmonary:     Effort: Pulmonary effort is normal. No respiratory distress.     Breath sounds: Normal breath sounds.  Abdominal:     General: There is no distension.     Palpations: Abdomen is soft.  Neurological:     Mental Status: He is alert and oriented to person, place, and time.  Laboratory: Most recent CBC Lab Results  Component Value Date   WBC 6.5 07/15/2024   HGB 15.0 07/15/2024   HCT 44.0 07/15/2024   MCV 92.1 07/15/2024   PLT 212 07/15/2024   Most recent BMP    Latest Ref Rng & Units 07/15/2024    6:05 PM  BMP  Glucose 70 - 99 mg/dL 99   BUN 8 - 23 mg/dL 16   Creatinine 9.38 - 1.24 mg/dL  8.79   Sodium 864 - 854 mmol/L 139   Potassium 3.5 - 5.1 mmol/L 4.2   Chloride 98 - 111 mmol/L 105    Other pertinent labs: A1c 5.8 Cholesterol 171 LDL 88  Imaging/Diagnostic Tests: No results found.  Luis Nancyann NOVAK, DO 07/17/2024, 1:07 PM  PGY-1, [facility] FPTS Intern pager: [phone], text pages welcome Secure chat group [facility] Teaching Service   "

## 2024-07-17 NOTE — Progress Notes (Signed)
 Inpatient Rehab Admissions Coordinator:    I met with Pt. To discuss potential CIR admit. He is interested and states his wife can provide 24/7 assist at dc. I will send case to insurance.   Leita Kleine, MS, CCC-SLP Rehab Admissions Coordinator  [phone] (celll) [phone] (office)

## 2024-07-17 NOTE — Consult Note (Signed)
 "     Physical Medicine and Rehabilitation Consult Reason for Consult: Impaired functional mobility Referring Physician: Jerri   HPI: Luis [name] is a 89 y.o. male with a history of atrial fibrillation on Eliquis , CAD/MI and prior TIA who was admitted on 07/15/2024 for unsteady gait.  He was not given TNK as he was outside the window.  CT of the head showed right occipital infarct.  CTA of the head and neck was notable for right P1 occlusion and other posterior circulation stenoses.  MRI revealed large right PCA infarct including a punctate lesion in the thalamus.  Neurology recommended adding aspirin  on top of patient's Eliquis .  They feel that stroke was likely due to large vessel disease.  Patient's atrial fibrillation has been rate controlled.  Blood pressure has been elevated.  Neurology added Zetia  to patient's Lipitor  he was on at baseline. Patient has been up with therapy and has been walking min assist 200 feet using a straight cane.  He is mod assist for basic ADLs.  Patient was independent and driving prior to this admission.  He lives with his spouse in a multilevel home with 4 steps to enter.  Bed and bathroom are upstairs.   Home: Home Living Family/patient expects to be discharged to:: Private residence Living Arrangements: Spouse/significant other Available Help at Discharge: Family, Available 24 hours/day Type of Home: House Home Access: Stairs to enter [employer] of Steps: 4 Entrance Stairs-Rails: Right, Can reach both, Left Home Layout: Multi-level, Bed/bath upstairs (3 level - there is a room on the main level if needed) Alternate Level Stairs-Number of Steps: flight Alternate Level Stairs-Rails: Right, Left Bathroom Shower/Tub: Health Visitor: Standard Home Equipment: [employer] (2 wheels), Rollator (4 wheels), Cane - single point, Shower seat, Grab bars - toilet (stair lift on stairs for shop?)  Functional History: Prior  Function Prior Level of Function : Independent/Modified Independent Mobility Comments: SPC for most mobility, denies falls. [facility] ADLs Comments: mod I, drives Functional Status:  Mobility: Bed Mobility Overal bed mobility: Needs Assistance Bed Mobility: Supine to Sit, Sit to Supine Supine to sit: Min assist Sit to supine: Min assist Transfers Overall transfer level: Needs assistance Transfers: Sit to/from Stand Sit to Stand: Min assist General transfer comment: As was the case for OT, min A to steady when standing, min A for balance when walking - max A for cues to scan and attend to L environment Ambulation/Gait Ambulation/Gait assistance: Min assist Gait Distance (Feet): 200 Feet Assistive device: Straight cane Gait Pattern/deviations: Step-through pattern, Decreased stride length General Gait Details: mildly unsteady overall, with mild hip drop L with narrowed steps.  All made worse by L field deficit and pt not aware enough to scan L, so brushes against OR runs into obstacles on the L.[name]  Also lose his way, due to misses hallways to the left . Gait velocity interpretation: <1.8 ft/sec, indicate of risk for recurrent falls    ADL: ADL Overall ADL's : Needs assistance/impaired Eating/Feeding: Minimal assistance Eating/Feeding Details (indicate cue type and reason): cues for L side of tray Grooming: Set up, Sitting Upper Body Bathing: Set up, Sitting Lower Body Bathing: Moderate assistance, Sit to/from stand Upper Body Dressing : Set up, Sitting Lower Body Dressing: Moderate assistance, Sit to/from stand Toilet Transfer: Moderate assistance Toilet Transfer Details (indicate cue type and reason): min A for balance, max A for cues to avoid obstacles on the L side Toileting- Clothing Manipulation and Hygiene: Minimal  assistance, Sit to/from stand, Sitting/lateral lean Functional mobility during ADLs: Moderate assistance, Cane General ADL Comments: Pt with L  drift and ran into every object  Cognition: Cognition Orientation Level: Oriented X4 Cognition Arousal: Alert Behavior During Therapy: WFL for tasks assessed/performed   Review of Systems  Constitutional:  Negative for fever.  HENT:  Negative for hearing loss.   Eyes:  Positive for blurred vision. Negative for double vision.  Respiratory: Negative.    Cardiovascular: Negative.   Gastrointestinal: Negative.   Genitourinary: Negative.   Musculoskeletal: Negative.   Skin:  Negative for rash.  Neurological:  Positive for weakness. Negative for dizziness, tingling and headaches.  Psychiatric/Behavioral: Negative.     Past Medical History:  Diagnosis Date   Anal abscess    Atrial fibrillation (HCC)    CAD (coronary artery disease)    a. BMS-RCA, BMS-Cx, PTCA of OM1 in 2001. b. overlapping DES in RCA and PTCA to small distal Cx 2013. c. Cath 2016 -> stable CAD, suspect dyspnea unrelated to coronary disease.   COLONIC POLYPS, HX OF    [name]   DJD (degenerative joint disease)    Essential hypertension    Hyperlipidemia    Myocardial infarction (HCC) 08/1999   Persistent atrial fibrillation (HCC)    a. dx 2016.   RBBB    TIA (transient ischemic attack)    Past Surgical History:  Procedure Laterality Date   CARDIAC CATHETERIZATION  ~ 2002   CARDIAC CATHETERIZATION N/A 03/02/2015   Procedure: Left Heart Cath and Coronary Angiography;  Surgeon: Ozell Fell, MD;  Location: [facility] INVASIVE CV LAB;  Service: Cardiovascular;  Laterality: N/A;   CATARACT EXTRACTION W/ INTRAOCULAR LENS  IMPLANT, BILATERAL  ~ 2007   bilaterally   CORONARY ANGIOPLASTY  07/30/11   CORONARY ANGIOPLASTY WITH STENT PLACEMENT  2001; 07/30/11;   2+3   FEMUR IM NAIL Left 11/12/2022   Procedure: INTRAMEDULLARY (IM) NAIL HIP FRACTURE;  Surgeon: Celena Ozell, MD;  Location: [facility] OR;  Service: Orthopedics;  Laterality: Left;   INGUINAL HERNIA REPAIR  2005   left   KNEE ARTHROSCOPY Left 02/21/2023   Procedure:  LEFT KNEE ARTHROSCOPY;  Surgeon: Celena Ozell, MD;  Location: [facility] OR;  Service: Orthopedics;  Laterality: Left;   LEFT HEART CATHETERIZATION WITH CORONARY ANGIOGRAM N/A 07/30/2011   Procedure: LEFT HEART CATHETERIZATION WITH CORONARY ANGIOGRAM;  Surgeon: Lonni JONETTA Cash, MD;  Location: [facility] CATH LAB;  Service: Cardiovascular;  Laterality: N/A;   PERCUTANEOUS CORONARY STENT INTERVENTION (PCI-S)  07/30/2011   Procedure: PERCUTANEOUS CORONARY STENT INTERVENTION (PCI-S);  Surgeon: Lonni JONETTA Cash, MD;  Location: [facility] CATH LAB;  Service: Cardiovascular;;   RETINAL DETACHMENT SURGERY  05/2008; 06/2008; 07/2008   left; all 3 surgeries have failed   SHOULDER INJECTION Left 02/21/2023   Procedure: LEFT SHOULDER SUBACROMIAL INJECTION WITH STEROID;  Surgeon: Celena Ozell, MD;  Location: [facility] OR;  Service: Orthopedics;  Laterality: Left;   Family History  Problem Relation Age of Onset   Alcohol abuse Mother    Colon cancer Father        in 56s   Alcoholism Sister    Social History:  reports that he quit smoking about 25 years ago. His smoking use included cigarettes. He started smoking about 65 years ago. He has a 20 pack-year smoking history. He has never used smokeless tobacco. He reports that he does not drink alcohol and does not use drugs. Allergies: Allergies[1] Medications Prior to Admission  Medication Sig Dispense Refill   acetaminophen  (TYLENOL )  500 MG tablet Take 1,000 mg by mouth every 6 (six) hours as needed for mild pain (pain score 1-3) or headache.     amLODipine  (NORVASC ) 5 MG tablet Take 1 tablet (5 mg total) by mouth daily. 90 tablet 3   amoxicillin  (AMOXIL ) 875 MG tablet Take 875 mg by mouth 2 (two) times daily.     apixaban  (ELIQUIS ) 5 MG TABS tablet Take 1 tablet (5 mg total) by mouth 2 (two) times daily. 180 tablet 2   atorvastatin  (LIPITOR ) 80 MG tablet Take 1 tablet (80 mg total) by mouth daily. 90 tablet 3   buPROPion  (WELLBUTRIN  XL) 150 MG 24 hr tablet Take 150 mg by  mouth every evening.     buPROPion  (WELLBUTRIN  XL) 300 MG 24 hr tablet Take 300 mg by mouth every morning.     FLUoxetine  (PROZAC ) 20 MG capsule Take 20 mg by mouth in the morning.     Glucosamine Sulfate 1000 MG CAPS Take 1,000 mg by mouth in the morning.     nitroGLYCERIN  (NITROSTAT ) 0.4 MG SL tablet DISSOLVE 1 TABLET UNDER TONGUE AS NEEEDED FOR CHEST PAIN, MAY REPEAT IN 5 MINUTES FOR 2 DOSES 25 tablet 11   silodosin (RAPAFLO) 4 MG CAPS capsule Take 4 mg by mouth daily with breakfast.     vitamin E 1000 UNIT capsule Take 1,000 Units by mouth in the morning.       Blood pressure (!) 169/74, pulse (!) 105, temperature 97.6 F (36.4 C), temperature source Oral, resp. rate 18, height 6' (1.829 m), weight 78 kg, SpO2 99%. Physical Exam Constitutional:      General: He is not in acute distress. HENT:     Head: Normocephalic.     Nose: Nose normal.  Eyes:     Extraocular Movements: Extraocular movements intact.     Conjunctiva/sclera: Conjunctivae normal.     Pupils: Pupils are equal, round, and reactive to light.  Cardiovascular:     Rate and Rhythm: Normal rate.  Pulmonary:     Effort: Pulmonary effort is normal.  Abdominal:     Palpations: Abdomen is soft.  Musculoskeletal:        General: Normal range of motion.     Cervical back: Normal range of motion.  Skin:    General: Skin is warm.  Neurological:     Mental Status: He is alert.     Comments: Pt is alert and oriented. Fair insight and awareness. Provided biographical information. A little impulsive with thought processes. Normal language. Speech clear. 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 FMC is fair. No gross limb ataxia. No pronator drift. MMT: 4+/5 RUE and RLE and 4 to 4+ in LUE and LLE. No abnl reflexes. Senses pain and lt in all 4's  Psychiatric:        Mood and Affect: Mood normal.     No results found for this or any previous visit (from the past 24 hours). ECHOCARDIOGRAM  COMPLETE Result Date: 07/16/2024    ECHOCARDIOGRAM REPORT   Patient Name:   Luis [name] Date of Exam: 07/16/2024 Medical Rec #:  [id number]        Height:       72.0 in Accession #:    [id number]       Weight:       172.0 lb Date of Birth:  Nov 18, 1934        BSA:          1.998  m Patient Age:    89 years         BP:           156/69 mmHg Patient Gender: M                HR:           64 bpm. Exam Location:  Inpatient Procedure: 2D Echo, Cardiac Doppler, Color Doppler and Saline Contrast Bubble            Study (Both Spectral and Color Flow Doppler were utilized during            procedure). Indications:    Stroke 163.9  History:        Patient has prior history of Echocardiogram examinations, most                 recent 11/12/2022. Previous Myocardial Infarction and CAD; Risk                 Factors:Hypertension and Dyslipidemia.  Sonographer:    Merlynn Argyle Referring Phys: [id number] TODD D MCDIARMID  Sonographer Comments: Suboptimal subcostal window and suboptimal parasternal window. Image acquisition challenging due to respiratory motion and Image acquisition challenging due to patient body habitus. IMPRESSIONS  1. Left ventricular ejection fraction, by estimation, is 55 to 60%. The left ventricle has normal function. The left ventricle has no regional wall motion abnormalities. There is moderate left ventricular hypertrophy. Left ventricular diastolic function  could not be evaluated.  2. Right ventricular systolic function is low normal. The right ventricular size is mildly enlarged. Tricuspid regurgitation signal is inadequate for assessing PA pressure.  3. Left atrial size was moderately dilated.  4. Right atrial size was severely dilated.  5. The mitral valve is grossly normal. Trivial mitral valve regurgitation.  6. The aortic valve is grossly normal. Aortic valve regurgitation is mild. No aortic stenosis is present.  7. Aortic dilatation noted. There is mild dilatation of the ascending aorta, measuring 41 mm.  There is mild dilatation of the aortic root, measuring 43 mm.  8. Agitated saline contrast bubble study was negative, with no evidence of any interatrial shunt. Comparison(s): Changes from prior study are noted. 11/12/2022: LVEF 60-65%. FINDINGS  Left Ventricle: Left ventricular ejection fraction, by estimation, is 55 to 60%. The left ventricle has normal function. The left ventricle has no regional wall motion abnormalities. The left ventricular internal cavity size was normal in size. There is  moderate left ventricular hypertrophy. Left ventricular diastolic function could not be evaluated due to atrial fibrillation. Left ventricular diastolic function could not be evaluated. Right Ventricle: The right ventricular size is mildly enlarged. No increase in right ventricular wall thickness. Right ventricular systolic function is low normal. Tricuspid regurgitation signal is inadequate for assessing PA pressure. Left Atrium: Left atrial size was moderately dilated. Right Atrium: Right atrial size was severely dilated. Pericardium: There is no evidence of pericardial effusion. Mitral Valve: The mitral valve is grossly normal. Trivial mitral valve regurgitation. Tricuspid Valve: The tricuspid valve is grossly normal. Tricuspid valve regurgitation is trivial. Aortic Valve: The aortic valve is grossly normal. Aortic valve regurgitation is mild. Aortic regurgitation PHT measures 185 msec. No aortic stenosis is present. Pulmonic Valve: The pulmonic valve was normal in structure. Pulmonic valve regurgitation is not visualized. Aorta: Aortic dilatation noted. There is mild dilatation of the ascending aorta, measuring 41 mm. There is mild dilatation of the aortic root, measuring 43 mm. IAS/Shunts: No atrial level shunt detected by  color flow Doppler. Agitated saline contrast was given intravenously to evaluate for intracardiac shunting. Agitated saline contrast bubble study was negative, with no evidence of any interatrial  shunt.  LEFT VENTRICLE PLAX 2D LVIDd:         3.80 cm LVIDs:         3.00 cm LV PW:         1.40 cm LV IVS:        1.40 cm LVOT diam:     2.10 cm LV SV:         70 LV SV Index:   35 LVOT Area:     3.46 cm  RIGHT VENTRICLE RV Basal diam:  3.69 cm RV S prime:     11.90 cm/s TAPSE (M-mode): 1.8 cm LEFT ATRIUM             Index        RIGHT ATRIUM           Index LA diam:        3.62 cm 1.81 cm/m   RA Area:     24.80 cm LA Vol (A2C):   62.3 ml 31.18 ml/m  RA Volume:   77.20 ml  38.64 ml/m LA Vol (A4C):   75.6 ml 37.84 ml/m LA Biplane Vol: 70.1 ml 35.08 ml/m  AORTIC VALVE LVOT Vmax:   78.80 cm/s LVOT Vmean:  58.300 cm/s LVOT VTI:    0.201 m AI PHT:      185 msec  AORTA Ao Root diam: 4.30 cm Ao Asc diam:  4.11 cm TRICUSPID VALVE TR Peak grad:   26.6 mmHg TR Vmax:        258.00 cm/s  SHUNTS Systemic VTI:  0.20 m Systemic Diam: 2.10 cm Vinie Maxcy MD Electronically signed by Vinie Maxcy MD Signature Date/Time: 07/16/2024/2:50:47 PM    Final    CT ANGIO HEAD NECK W WO CM Result Date: 07/15/2024 EXAM: CTA HEAD AND NECK WITH AND WITHOUT 07/15/2024 06:56:25 PM TECHNIQUE: CTA of the head and neck was performed with and without the administration of 75 mL iohexol  (OMNIPAQUE ) 350 MG/ML injection. Multiplanar 2D and/or 3D reformatted images are provided for review. Automated exposure control, iterative reconstruction, and/or weight based adjustment of the mA/kV was utilized to reduce the radiation dose to as low as reasonably achievable. Stenosis of the internal carotid arteries measured using NASCET criteria. COMPARISON: Brain MRI 07/15/2024. CTA head and neck 11/29/2014 CLINICAL HISTORY: Neuro deficit, acute, stroke suspected. Acute neurologic deficit; stroke suspected. FINDINGS: CTA NECK: AORTIC ARCH AND ARCH VESSELS: Calcific aortic atherosclerosis. No dissection or arterial injury. No significant stenosis of the brachiocephalic or subclavian arteries. CERVICAL CAROTID ARTERIES: Mild atherosclerotic calcification at  the right carotid bifurcation without hemodynamically significant stenosis of the right ICA. Bulky atherosclerotic calcification at the left carotid bifurcation approximately 50% stenosis of the proximal left ICA. No dissection or arterial injury. CERVICAL VERTEBRAL ARTERIES: Atherosclerotic calcification at the right vertebral artery origin, without high-grade stenosis. Multifocal narrowing of the left vertebral artery V2 segment. The left V4 segment is occluded proximal to the vertebrobasilar confluence. No dissection or arterial injury. LUNGS AND MEDIASTINUM: Bilateral apical pulmonary emphysema. SOFT TISSUES: No acute abnormality. BONES: No acute abnormality. CTA HEAD: ANTERIOR CIRCULATION: Atherosclerotic calcification of both carotid siphons with severe stenosis of the clinoid segments. No significant stenosis of the anterior cerebral arteries. No significant stenosis of the middle cerebral arteries. No aneurysm. POSTERIOR CIRCULATION: Occlusion of the right PCA distal P1 segment. No significant stenosis of  the basilar artery. The intracranial segment of the left vertebral artery (V4) is occluded proximal to the vertebrobasilar confluence. The intracranial segment of the right vertebral artery (V4) is patent. No aneurysm. OTHER: No dural venous sinus thrombosis on this non-dedicated study. IMPRESSION: 1. Occlusion of the right PCA distal P1 segment. 2. Multifocal narrowing of the left vertebral artery V2 segment and occlusion of the left V4 segment proximal to the vertebrobasilar confluence. 3. 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 4. Approximately 50% stenosis of the proximal left ICA. Findings communicated to Dr. Simon at 7:35 PM on 07/15/2024. Electronically signed by: Franky Stanford MD 07/15/2024 07:37 PM EST RP Workstation: [id number]   CT HEAD WO CONTRAST ( ) Result Date: 07/15/2024 CLINICAL DATA:  History of headache and neurologic deficit, confusion, acute infarct seen on  preceding MRI exam EXAM: CT HEAD WITHOUT CONTRAST TECHNIQUE: Contiguous axial images were obtained from the base of the skull through the vertex without intravenous contrast. RADIATION DOSE REDUCTION: This exam was performed according to the departmental dose-optimization program which includes automated exposure control, adjustment of the mA and/or kV according to patient size and/or use of iterative reconstruction technique. COMPARISON:  MRI 07/15/2024 at 4:44 p.m. FINDINGS: Brain: Right occipital cortical hypodensity compatible with the acute infarct seen on preceding MRI. No evidence of hemorrhagic transformation. Chronic small vessel ischemic changes are seen elsewhere within the periventricular white matter. No evidence of acute hemorrhage. The lateral ventricles and remaining midline structures are unremarkable. No acute extra-axial fluid collections. No mass effect. Vascular: Dense atherosclerosis within the vertebral and internal carotid arteries. Please see subsequent CT angiography exam for vascular findings. Skull: Normal. Negative for fracture or focal lesion. Sinuses/Orbits: No acute finding. Postsurgical changes within the left globe. Other: None. IMPRESSION: 1. Right occipital cortical infarct, corresponding to preceding MRI findings. 2. No evidence of acute intracranial hemorrhage. 3. Please refer to subsequent CT angiography exam for vascular findings. Electronically Signed   By: Ozell Daring M.D.   On: 07/15/2024 19:07   MR Brain Wo Contrast Result Date: 07/15/2024 EXAM: MRI BRAIN WITHOUT CONTRAST 07/15/2024 05:15:40 PM TECHNIQUE: Multiplanar multisequence MRI of the head/brain was performed without the administration of intravenous contrast. COMPARISON: None available. CLINICAL HISTORY: Headache, neuro deficit. FINDINGS: BRAIN AND VENTRICLES: There is restricted diffusion in the right occipital lobe, most pronounced in the cortex and subcortical white matter of the medial right occipital  lobe. Additionally, there are areas of restricted diffusion involving the right hippocampus. There is subtle diffusion signal abnormality in the right thalamus. These findings are concerning for a right PCA territory infarct. The right PCA flow void and appears diminutive and may reflect PCA occlusion. Recommend CTA for further evaluation of potential vascular occlusion. There is also abnormal appearance of the intracranial left vertebral artery flow void, which could reflect significant atherosclerosis versus occlusion. T2 and FLAIR hyperintensity is present in the periventricular and subcortical white matter, compatible with chronic microvascular ischemic changes. There is moderate generalized parenchymal volume loss. A remote lacunar infarct is noted in the left thalamus. Multiple prominent perivascular spaces are present in the basal ganglia. A remote infarct is seen in the left cerebellum. No intracranial hemorrhage. No mass. No midline shift. No hydrocephalus. The sella is unremarkable. ORBITS: Bilateral lens replacement. Postsurgical changes of the left globe. SINUSES AND MASTOIDS: Mild mucosal thickening in the ethmoid sinuses. BONES AND SOFT TISSUES: Normal marrow signal. No soft tissue abnormality. IMPRESSION: 1. Acute right PCA territory infarct. Diminutive right PCA flow void,  findings concerning for possible right PCA occlusion. Recommend CTA head and neck for further evaluation. 2. Abnormal appearance of the intracranial left vertebral artery flow void, which may reflect significant atherosclerosis or occlusion. 3. Chronic microvascular ischemic changes and remote infarcts as above. 4. Impression #1 discussed with Lonni Camp, PA at 5:48 PM on 07/15/24. Electronically signed by: Donnice Mania MD 07/15/2024 06:13 PM EST RP Workstation: [id number]    Assessment/Plan: Diagnosis: 89 year old male with large right PCA infarct likely due to right P1 occlusion Does the need for close, 24 hr/day medical  supervision in concert with the patient's rehab needs make it unreasonable for this patient to be served in a less intensive setting? Yes Co-Morbidities requiring supervision/potential complications:  - Hypertension -Atrial fibrillation Due to bladder management, bowel management, safety, skin/wound care, disease management, medication administration, and patient education, does the patient require 24 hr/day rehab nursing? Yes Does the patient require coordinated care of a physician, rehab nurse, therapy disciplines of PT, OT, SLP to address physical and functional deficits in the context of the above medical diagnosis(es)? Yes Addressing deficits in the following areas: balance, endurance, locomotion, strength, transferring, bowel/bladder control, bathing, dressing, feeding, grooming, toileting, cognition, and psychosocial support Can the patient actively participate in an intensive therapy program of at least 3 hrs of therapy per day at least 5 days per week? Yes The potential for patient to make measurable gains while on inpatient rehab is excellent Anticipated functional outcomes upon discharge from inpatient rehab are modified independent and supervision  with PT, modified independent and supervision with OT, modified independent and supervision with SLP. Estimated rehab length of stay to reach the above functional goals is: 7 days Anticipated discharge destination: Home Overall Rehab/Functional Prognosis: excellent  POST ACUTE RECOMMENDATIONS: This patient's condition is appropriate for continued rehabilitative care in the following setting: CIR Patient has agreed to participate in recommended program. Yes Note that insurance prior authorization may be required for reimbursement for recommended care.  Comment: Has bedroom on first floor. Believe he also has a stair lift. Rehab Admissions Coordinator to follow up.      I have personally performed a face to face diagnostic evaluation of  this patient. Additionally, I have examined the patient's medical record including any pertinent labs and radiographic images.    Thanks,  Arthea ONEIDA Gunther, MD 07/17/2024      [1] No Known Allergies  "

## 2024-07-17 NOTE — Plan of Care (Signed)

## 2024-07-17 NOTE — Assessment & Plan Note (Signed)
 Patient recently underwent dental procedure and was recommended to continue amoxicillin  following; started amoxicillin  875 BID Sunday 2/1.  Per patient and wife, 4 days remaining. - Amoxicillin  875 dosing not available on hospital formulary. Will continue amoxicillin  1000 mg twice daily for 4 days (final dose 2/8)

## 2024-07-17 NOTE — Assessment & Plan Note (Signed)
 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 Pt has not had perfect compliance with Eliquis .  - Neurology consulted, signed off, appreciate recs - Continue Eliquis  - Echocardiogram: EF 55-60%, no PFO. - PT/OT: Recommend CIR - NIH strokes scale every 2 hours for 12 hours, then every 4 hours - PT/OT to treat - SLP eval placed - VTE prophylaxis with home Eliquis  5 mg twice daily - Fall precautions

## 2024-07-17 NOTE — Progress Notes (Signed)
 Physical Therapy Treatment Patient Details Name: Luis [name] MRN: [id number] DOB: 08/09/1934 Today's Date: 07/17/2024   History of Present Illness Luis [name] is a 89 yo male who presented with intermittent HA and confusion for 1 week.   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 CTA found Occlusion of R PCA. PMHx: a fib, CAD, DJD< HLD, MMI, RBBB, TIA    PT Comments  Pt tolerated treatment well today. Pt today was able to ambulate in hallway with RW Min A and navigate stairs. Pt still quite limited by L visual field deficits and was quite impulsive today with extremely poor safety awareness. No change in DC/DME recs at this time. PT will continue to follow.     If plan is discharge home, recommend the following: A little help with walking and/or transfers;A little help with bathing/dressing/bathroom;Assistance with cooking/housework;Assist for transportation;Help with stairs or ramp for entrance   Can travel by private vehicle        Equipment Recommendations   (TBD next venue)    Recommendations for Other Services Rehab consult     Precautions / Restrictions Precautions Precautions: Fall Recall of Precautions/Restrictions: Impaired Restrictions Weight Bearing Restrictions Per Provider Order: No     Mobility  Bed Mobility Overal bed mobility: Needs Assistance Bed Mobility: Supine to Sit     Supine to sit: Contact guard     General bed mobility comments: CGA due to impulsivity    Transfers Overall transfer level: Needs assistance Equipment used: Rolling walker (2 wheels) Transfers: Sit to/from Stand Sit to Stand: Contact guard assist           General transfer comment: Pt impulsively stood while PT was adjusting RW height.    Ambulation/Gait Ambulation/Gait assistance: Min assist Gait Distance (Feet): 200 Feet Assistive device: Rolling walker (2 wheels) Gait Pattern/deviations: Step-through pattern, Decreased stride length, Drifts right/left, Trunk flexed    Gait velocity interpretation: <1.8 ft/sec, indicate of risk for recurrent falls   General Gait Details: trialed RW today. Mildly unsteady with no visual awareness on L requiring constant cues for obstacle navigation. Difficulty with path finding as well.   Stairs Stairs: Yes Stairs assistance: Min assist Stair Management: Two rails, Step to pattern, Forwards Number of Stairs: 2 General stair comments: Min A for balance.   Wheelchair Mobility     Tilt Bed    Modified Rankin (Stroke Patients Only) Modified Rankin (Stroke Patients Only) Pre-Morbid Rankin Score: No significant disability Modified Rankin: Moderate disability     Balance Overall balance assessment: Needs assistance Sitting-balance support: Feet supported Sitting balance-Leahy Scale: Fair     Standing balance support: Single extremity supported, During functional activity Standing balance-Leahy Scale: Poor (to fair statically) Standing balance comment: statically fair.                            Communication Communication Communication: No apparent difficulties  Cognition Arousal: Alert Behavior During Therapy: WFL for tasks assessed/performed                             Following commands: Impaired Following commands impaired: Only follows one step commands consistently, Follows multi-step commands inconsistently, Follows multi-step commands with increased time    Cueing Cueing Techniques: Verbal cues  Exercises      General Comments General comments (skin integrity, edema, etc.): VSS      Pertinent Vitals/Pain Pain Assessment Pain Assessment: No/denies pain  Home Living                          Prior Function            PT Goals (current goals can now be found in the care plan section) Acute Rehab PT Goals Patient Stated Goal: I want to go for some good therapy to get me independent PT Goal Formulation: With patient Time For Goal Achievement:  07/30/24 Potential to Achieve Goals: Good Progress towards PT goals: Progressing toward goals    Frequency    Min 3X/week      PT Plan      Co-evaluation              AM-PAC PT 6 Clicks Mobility   Outcome Measure  Help needed turning from your back to your side while in a flat bed without using bedrails?: A Little Help needed moving from lying on your back to sitting on the side of a flat bed without using bedrails?: A Little Help needed moving to and from a bed to a chair (including a wheelchair)?: A Little Help needed standing up from a chair using your arms (e.g., wheelchair or bedside chair)?: A Little Help needed to walk in hospital room?: A Little Help needed climbing 3-5 steps with a railing? : A Little 6 Click Score: 18    End of Session Equipment Utilized During Treatment: Gait belt Activity Tolerance: Patient tolerated treatment well;Patient limited by fatigue (afib leading to some fatigue) Patient left: in bed;with family/visitor present;with call bell/phone within reach;with bed alarm set Nurse Communication: Mobility status PT Visit Diagnosis: Other abnormalities of gait and mobility (R26.89);Other symptoms and signs involving the nervous system (R29.898)     Time: 8557-8498 PT Time Calculation (min) (ACUTE ONLY): 19 min  Charges:    $Gait Training: 8-22 mins PT General Charges $$ ACUTE PT VISIT: 1 Visit                     Sueellen NOVAK, PT, DPT Acute Rehab Services [phone]    Kaylan Yates 07/17/2024, 5:20 PM

## 2024-07-17 NOTE — Discharge Instructions (Signed)
 Dear Luis [name],  Thank you for letting us  participate in your care. You were hospitalized for dizziness, headache, vision changes and diagnosed with Cerebral infarct (HCC). You were seen by neurology and started medications to help decrease your risk of stroke. Please follow up in stroke clinic in ~ 4 weeks.   POST-HOSPITAL & CARE INSTRUCTIONS Go to your follow up appointments (listed below)   DOCTOR'S APPOINTMENT   No future appointments.  Follow-up Information     [facility]. Schedule an appointment as soon as possible for a visit in 1 month(s).   Specialty: Neurology Why: stroke clinic Contact information: [address] [place]  [phone]                Take care and be well!  Family Medicine Teaching Service Inpatient Team [place]  [facility]  [address] [place], [place] [address] [phone]
